# Patient Record
Sex: Female | Born: 1960
Health system: Southern US, Community
[De-identification: ages and names within clinical notes are randomized; demographics above are authoritative.]

## PROBLEM LIST (undated history)

## (undated) ENCOUNTER — Emergency Department (HOSPITAL_BASED_OUTPATIENT_CLINIC_OR_DEPARTMENT_OTHER): Admission: EM | Payer: Medicaid Other | Source: Home / Self Care

## (undated) DIAGNOSIS — I10 Essential (primary) hypertension: Secondary | ICD-10-CM

## (undated) DIAGNOSIS — E271 Primary adrenocortical insufficiency: Secondary | ICD-10-CM

## (undated) DIAGNOSIS — E039 Hypothyroidism, unspecified: Secondary | ICD-10-CM

## (undated) DIAGNOSIS — E119 Type 2 diabetes mellitus without complications: Secondary | ICD-10-CM

## (undated) DIAGNOSIS — I509 Heart failure, unspecified: Secondary | ICD-10-CM

## (undated) DIAGNOSIS — E079 Disorder of thyroid, unspecified: Secondary | ICD-10-CM

## (undated) DIAGNOSIS — G9389 Other specified disorders of brain: Secondary | ICD-10-CM

## (undated) HISTORY — DX: Other specified disorders of brain: G93.89

---

## 2005-02-17 ENCOUNTER — Inpatient Hospital Stay (HOSPITAL_COMMUNITY): Admission: EM | Admit: 2005-02-17 | Discharge: 2005-02-23 | Payer: Self-pay | Admitting: Emergency Medicine

## 2005-02-17 ENCOUNTER — Ambulatory Visit: Payer: Self-pay | Admitting: Pulmonary Disease

## 2005-03-02 ENCOUNTER — Ambulatory Visit: Payer: Self-pay | Admitting: Critical Care Medicine

## 2010-10-17 ENCOUNTER — Emergency Department (HOSPITAL_COMMUNITY): Admission: EM | Admit: 2010-10-17 | Discharge: 2010-10-18 | Payer: Self-pay | Admitting: Emergency Medicine

## 2011-03-03 LAB — DIFFERENTIAL
Basophils Absolute: 0 10*3/uL (ref 0.0–0.1)
Eosinophils Absolute: 0.2 10*3/uL (ref 0.0–0.7)
Eosinophils Relative: 3 % (ref 0–5)
Lymphs Abs: 2.2 10*3/uL (ref 0.7–4.0)
Neutrophils Relative %: 70 % (ref 43–77)

## 2011-03-03 LAB — URINE CULTURE
Colony Count: NO GROWTH
Culture: NO GROWTH

## 2011-03-03 LAB — CULTURE, BLOOD (ROUTINE X 2)
Culture  Setup Time: 201110300127
Culture: NO GROWTH

## 2011-03-03 LAB — CBC
MCH: 30.8 pg (ref 26.0–34.0)
MCV: 86.1 fL (ref 78.0–100.0)
Platelets: 175 10*3/uL (ref 150–400)
RBC: 3.81 MIL/uL — ABNORMAL LOW (ref 3.87–5.11)
RDW: 12.8 % (ref 11.5–15.5)
WBC: 9 10*3/uL (ref 4.0–10.5)

## 2011-03-03 LAB — POCT I-STAT, CHEM 8
BUN: 20 mg/dL (ref 6–23)
Chloride: 101 mEq/L (ref 96–112)
Creatinine, Ser: 1.6 mg/dL — ABNORMAL HIGH (ref 0.4–1.2)
Glucose, Bld: 105 mg/dL — ABNORMAL HIGH (ref 70–99)
Potassium: 4 mEq/L (ref 3.5–5.1)
Sodium: 134 mEq/L — ABNORMAL LOW (ref 135–145)

## 2011-03-03 LAB — URINALYSIS, ROUTINE W REFLEX MICROSCOPIC
Nitrite: NEGATIVE
Protein, ur: NEGATIVE mg/dL
Specific Gravity, Urine: 1.02 (ref 1.005–1.030)
Urobilinogen, UA: 0.2 mg/dL (ref 0.0–1.0)

## 2011-03-03 LAB — URINE MICROSCOPIC-ADD ON

## 2011-03-03 LAB — POCT PREGNANCY, URINE: Preg Test, Ur: NEGATIVE

## 2011-05-07 NOTE — Discharge Summary (Signed)
[name], [name] NO.:  [id number]   MEDICAL RECORD NO.:  [id number]          PATIENT TYPE:  INP   LOCATION:  [id number]                         FACILITY:  [facility]   PHYSICIAN:  Asencion Noble, M.D. LHCDATE OF BIRTH:  August 03, 1961   DATE OF ADMISSION:  02/17/2005  DATE OF DISCHARGE:  02/23/2005                                 DISCHARGE SUMMARY   DISCHARGE DIAGNOSES:  1.  Probable viral pneumonia with pneumonitis.  2.  Hypoxic respiratory failure secondary to #1.  3.  Probable urinary tract infection.  4.  Severe sepsis, in septic shock.  5.  Relative adrenal insufficiency.   LABORATORY DATA AND DIAGNOSTICS:  Blood cultures obtained on February 17, 2005,  no growth times five days. On February 23, 2005, sodium was 141, potassium 3.9,  chloride 111, CO2 24, glucose 87, BUN 9, creatinine 1.0. CBC revealed WBC of  6.8, hemoglobin 10.3, hematocrit 29.4, platelet count 115,000, but that was  also obtained on February 23, 2005.  Random cortisol obtained on February 18, 2005,  was 2.9, following cosyntropin stimulation test was 4.3, also obtained on  February 18, 2005. Urine Legionella antigen obtained February 18, 2005, was  negative. Lactic acid on February 17, 2005, was 0.7. Urinalysis obtained on  February 17, 2005, remaining in preliminary status, has been sent for re-  incubation for better growth. A two-view chest x-ray obtained on February 22, 2005, demonstrating mild improved bibasilar opacities. Questionable small  bilateral pleural effusions.   BRIEF HISTORY:  This is a 50 year old [place] female who had a two-day  history of upper respiratory tract infection type syndrome. Then followed by  a dry cough, increased dyspnea, alteration in consciousness. She was unable  to move or walk. She had noted increased confusion, temperature up to 104,  blood pressure presenting in the [id number] systolically, had early infiltrate in  the right greater than left lower lobes, and the pulmonary critical care  team was  asked to see and admit this patient for severe sepsis and/or early  goal-directed therapy in the emergency room.   HOSPITAL COURSE:   PROBLEM #1:  Probable viral pneumonia and pneumonitis with hypoxic  respiratory failure. The patient was admitted to the intensive care,  supported on nasal cannula oxygen, given empiric antibiotics which included  vancomycin, Rocephin, and Zithromax. The patient was also administered  Xigris on the day of admission. Her respiratory work of breathing remained  stable, she did not require intubation, although her chest x-ray worsened to  develop bilateral airspace disease. She was able to get through her critical  illness without requiring intubation.   PROBLEM #2:  Urinary tract infection. Although no organisms have been  specified at this point, the patient did have positive leukocytes and  nitrates on UA, follow up culture data is still pending at this point.  However, she will be sent home on empiric Tequin to cover for a probable  UTI.   PROBLEM #3:  Severe sepsis and septic shock. The patient was admitted to the  intensive care unit where she underwent aggressive goal directed therapy  keeping  the CVP between 8 and 12. She did require vasoactive drips with  Levophed and then later adding inotropy with dobutamine as well as requiring  blood transfusions to get her venous SVO2 greater than 70%. She stabilized  over the course of 48 hours. She was then able to wean off vasoactive drips  and was transferred to the floor where her status was stable. Again, she was  covered with broad-spectrum antibiotics and later converted to oral Tequin  on February 22, 2005. The patient also completed a 48-hour treatment regimen of  Xigris.   PROBLEM #4:  Relative adrenal insufficiency. As earlier noted, the patient  had a suboptimal response to cosyntropin skin test. She was treated with IV  Solu-Cortef. The patient was discontinued 72 hours after initiation of   therapy.   DISPOSITION UPON DISCHARGE:  She is afebrile, heart rate in the 50s, blood  pressure 117/96, oxygen saturation 96% to 98% on room air. She is alert, she  is oriented, and she is tolerating oral diet.  She denies dyspnea. Breath  sounds are clear. Heart tones are normal. She does have some trace edema,  but this is bilaterally, and she has no lower extremity pain. Her central  line was removed 24 hours ago. Here dressing was re-dressed today and the  site looks clean, dry, intact, and well approximated.   DISCHARGE INSTRUCTIONS:  Medications include Tequin 400 mg one tab p.o. for  five days. Diet as tolerated. Activity as tolerated. She is instructed to  clean this wound site from central line with clean soap and water, and place  a bandage over it every day until completely healed. She has a follow-up  appointment on March 02, 2005, at 2:50 p.m. with Dr. Joya Gaskins. At this time  the following labs should be obtained: She will need a follow-up complete  blood count to evaluate thrombocytopenia as the patient's platelets have  remained in the low 100s. Also, she will require a follow-up metabolic panel  to re-evaluate her liver enzymes. The patient's total bilirubin was 2.1,  alkaline phosphatase 143, AST 104, and ALT 103 upon time of admission. This  will need to be followed up on an outpatient basis.      PB/MEDQ  D:  02/23/2005  T:  02/23/2005  Job:  [id number]   cc:   Asencion Noble, M.D. [facility]

## 2011-05-07 NOTE — H&P (Signed)
NAMECHELBY, SHARF NO.:  [id number]   MEDICAL RECORD NO.:  [id number]          PATIENT TYPE:  EMS   LOCATION:  ED                           FACILITY:  [facility]   PHYSICIAN:  Asencion Noble, M.D. LHCDATE OF BIRTH:  1961-04-23   DATE OF ADMISSION:  02/17/2005  DATE OF DISCHARGE:                                HISTORY & PHYSICAL   CHIEF COMPLAINT:  Septic shock.   HISTORY OF PRESENT ILLNESS:  A 50 year old [place] female has a two-day  history of URI type syndrome, then a dry cough, increased dyspnea,  alterations in consciousness, not able to move or walk, has noted some  increased confusion, temperature up to 104, blood pressure in the 70s, has  an early infiltrate in the right greater than lower lobe and we are asked to  see and admit this patient for severe sepsis and early goal directed therapy  via the ED.   PAST MEDICAL HISTORY:  Negative.   No previous medications.   No allergies.   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   No previous recent admissions.   PHYSICAL EXAMINATION:  VITAL SIGNS:  Blood pressure 70/40, pulse 100, temp  104, respirations 25.  GENERAL:  This is an ill-appearing [place] female in no acute distress.  CHEST:  Showed rales at the bases, equal breath sounds.  CARDIAC:  Showed resting tachycardia without S3.  Normal S1 S2.  ABDOMEN:  Soft, nontender.  Bowel sounds active.  EXTREMITIES:  Showed no edema, or clubbing, were cool.  SKIN:  Clear.   LABORATORY DATA:  White count 6.5, hemoglobin 10.9, platelet count of 102.  Influenza B and A titers negative from the nose.  White count 0-2, red cells  3-6.  Urinalysis shows large blood, nitrite negative, leukocytes are  moderate positive on leukocyte esterase.  CMP:  Sodium 134, potassium 3.5,  chloride 102, CO2 25, BUN 17, creatinine is 1.7.  Alk phos 143, AST 104, ALT  103, total protein 6.7, albumin 3.3, calcium 8.6.   IMPRESSION:  Septic shock, likely pneumonia +/-  urinary tract source with  hypoxemia and shock state.   RECOMMENDATIONS:  1.  Administer early goal-directed therapy with resuscitation protocol      utilizing saline for CVP goals of 8-12, MAP of 65+.  2.  See sepsis protocol.  3.  Administer IV Rocephin, IV vancomycin, IV Zithromax.  4.  Check Legionella and Pneumococcal urine antigens.  5.  Follow up culture data.  6.  Admit to ICU.  7.  Place central line.      PW/MEDQ  D:  02/17/2005  T:  02/17/2005  Job:  [id number]

## 2011-05-10 ENCOUNTER — Inpatient Hospital Stay (HOSPITAL_COMMUNITY)
Admission: EM | Admit: 2011-05-10 | Discharge: 2011-05-21 | DRG: 644 | Disposition: A | Discharge status: Home / Self Care | Payer: Self-pay | Attending: Cardiovascular Disease | Admitting: Cardiovascular Disease

## 2011-05-10 ENCOUNTER — Emergency Department (HOSPITAL_COMMUNITY): Payer: Self-pay

## 2011-05-10 DIAGNOSIS — I2589 Other forms of chronic ischemic heart disease: Secondary | ICD-10-CM | POA: Diagnosis present

## 2011-05-10 DIAGNOSIS — R209 Unspecified disturbances of skin sensation: Secondary | ICD-10-CM | POA: Diagnosis present

## 2011-05-10 DIAGNOSIS — M6282 Rhabdomyolysis: Secondary | ICD-10-CM | POA: Diagnosis present

## 2011-05-10 DIAGNOSIS — D638 Anemia in other chronic diseases classified elsewhere: Secondary | ICD-10-CM | POA: Diagnosis present

## 2011-05-10 DIAGNOSIS — E1169 Type 2 diabetes mellitus with other specified complication: Secondary | ICD-10-CM | POA: Diagnosis present

## 2011-05-10 DIAGNOSIS — J40 Bronchitis, not specified as acute or chronic: Secondary | ICD-10-CM | POA: Diagnosis present

## 2011-05-10 DIAGNOSIS — E039 Hypothyroidism, unspecified: Principal | ICD-10-CM | POA: Diagnosis present

## 2011-05-10 DIAGNOSIS — R5383 Other fatigue: Secondary | ICD-10-CM | POA: Diagnosis present

## 2011-05-10 DIAGNOSIS — E871 Hypo-osmolality and hyponatremia: Secondary | ICD-10-CM | POA: Diagnosis present

## 2011-05-10 DIAGNOSIS — R5381 Other malaise: Secondary | ICD-10-CM | POA: Diagnosis present

## 2011-05-10 DIAGNOSIS — E2749 Other adrenocortical insufficiency: Secondary | ICD-10-CM | POA: Diagnosis present

## 2011-05-10 DIAGNOSIS — I10 Essential (primary) hypertension: Secondary | ICD-10-CM | POA: Diagnosis present

## 2011-05-10 DIAGNOSIS — N289 Disorder of kidney and ureter, unspecified: Secondary | ICD-10-CM | POA: Diagnosis present

## 2011-05-10 DIAGNOSIS — D696 Thrombocytopenia, unspecified: Secondary | ICD-10-CM | POA: Diagnosis present

## 2011-05-10 DIAGNOSIS — F411 Generalized anxiety disorder: Secondary | ICD-10-CM | POA: Diagnosis present

## 2011-05-10 LAB — URINALYSIS, ROUTINE W REFLEX MICROSCOPIC
Bilirubin Urine: NEGATIVE
Glucose, UA: NEGATIVE mg/dL
Ketones, ur: NEGATIVE mg/dL
pH: 7 (ref 5.0–8.0)

## 2011-05-10 LAB — DIFFERENTIAL
Basophils Absolute: 0 10*3/uL (ref 0.0–0.1)
Eosinophils Absolute: 0.3 10*3/uL (ref 0.0–0.7)
Eosinophils Absolute: 0.2 10*3/uL (ref 0.0–0.7)
Eosinophils Relative: 5 % (ref 0–5)
Metamyelocytes Relative: 0 %
Monocytes Absolute: 0.2 10*3/uL (ref 0.1–1.0)
Myelocytes: 0 %
Neutrophils Relative %: 52 % (ref 43–77)
Promyelocytes Absolute: 0 %
nRBC: 0 /100 WBC

## 2011-05-10 LAB — CBC
HCT: 28 % — ABNORMAL LOW (ref 36.0–46.0)
MCH: 28.8 pg (ref 26.0–34.0)
MCHC: 36.9 g/dL — ABNORMAL HIGH (ref 30.0–36.0)
Platelets: 140 10*3/uL — ABNORMAL LOW (ref 150–400)
Platelets: 126 10*3/uL — ABNORMAL LOW (ref 150–400)
RBC: 3.78 MIL/uL — ABNORMAL LOW (ref 3.87–5.11)
WBC: 5.8 10*3/uL (ref 4.0–10.5)

## 2011-05-10 LAB — URINE MICROSCOPIC-ADD ON

## 2011-05-10 LAB — GLUCOSE, CAPILLARY
Glucose-Capillary: 171 mg/dL — ABNORMAL HIGH (ref 70–99)
Glucose-Capillary: 176 mg/dL — ABNORMAL HIGH (ref 70–99)
Glucose-Capillary: 99 mg/dL (ref 70–99)

## 2011-05-10 LAB — POCT I-STAT, CHEM 8
BUN: 9 mg/dL (ref 6–23)
Chloride: 90 mEq/L — ABNORMAL LOW (ref 96–112)
Potassium: 3.4 mEq/L — ABNORMAL LOW (ref 3.5–5.1)
Sodium: 123 mEq/L — ABNORMAL LOW (ref 135–145)
TCO2: 24 mmol/L (ref 0–100)

## 2011-05-10 LAB — POCT CARDIAC MARKERS
CKMB, poc: 30.6 ng/mL (ref 1.0–8.0)
Myoglobin, poc: >500 ng/mL (ref 12–200)
Troponin i, poc: <0.05 ng/mL (ref 0.00–0.09)

## 2011-05-10 LAB — CK TOTAL AND CKMB (NOT AT ARMC)
CK, MB: 41.7 ng/mL (ref 0.3–4.0)
Relative Index: 0.4 (ref 0.0–2.5)

## 2011-05-11 LAB — CBC
HCT: 27.3 % — ABNORMAL LOW (ref 36.0–46.0)
MCV: 79.6 fL (ref 78.0–100.0)
RDW: 13.3 % (ref 11.5–15.5)
WBC: 5.9 10*3/uL (ref 4.0–10.5)

## 2011-05-11 LAB — GLUCOSE, CAPILLARY
Glucose-Capillary: 92 mg/dL (ref 70–99)
Glucose-Capillary: 106 mg/dL — ABNORMAL HIGH (ref 70–99)
Glucose-Capillary: 100 mg/dL — ABNORMAL HIGH (ref 70–99)
Glucose-Capillary: 76 mg/dL (ref 70–99)

## 2011-05-11 LAB — T4: T4, Total: 0.4 ug/dL — ABNORMAL LOW (ref 5.0–12.5)

## 2011-05-11 LAB — BASIC METABOLIC PANEL
GFR calc Af Amer: 58 mL/min — ABNORMAL LOW (ref >60)
GFR calc non Af Amer: 48 mL/min — ABNORMAL LOW (ref >60)
Glucose, Bld: 84 mg/dL (ref 70–99)
Potassium: 4.2 mEq/L (ref 3.5–5.1)
Sodium: 123 mEq/L — ABNORMAL LOW (ref 135–145)

## 2011-05-11 LAB — LIPID PANEL
HDL: 28 mg/dL — ABNORMAL LOW (ref >39)
Total CHOL/HDL Ratio: 7.5 RATIO
Triglycerides: 357 mg/dL — ABNORMAL HIGH (ref <150)
VLDL: 71 mg/dL — ABNORMAL HIGH (ref 0–40)

## 2011-05-11 LAB — LACTATE DEHYDROGENASE: LDH: 701 U/L — ABNORMAL HIGH (ref 94–250)

## 2011-05-11 LAB — CARDIAC PANEL(CRET KIN+CKTOT+MB+TROPI): CK, MB: 47.6 ng/mL (ref 0.3–4.0)

## 2011-05-11 LAB — T3: T3, Total: 22.6 ng/dl — ABNORMAL LOW (ref 80.0–204.0)

## 2011-05-11 LAB — CK TOTAL AND CKMB (NOT AT ARMC): Relative Index: 0.2 (ref 0.0–2.5)

## 2011-05-11 LAB — TROPONIN I: Troponin I: <0.3 ng/mL (ref <0.30)

## 2011-05-12 DIAGNOSIS — M79609 Pain in unspecified limb: Secondary | ICD-10-CM

## 2011-05-12 LAB — TROPONIN I: Troponin I: <0.3 ng/mL (ref <0.30)

## 2011-05-12 LAB — CBC
HCT: 29 % — ABNORMAL LOW (ref 36.0–46.0)
Hemoglobin: 10.6 g/dL — ABNORMAL LOW (ref 12.0–15.0)
MCH: 29 pg (ref 26.0–34.0)
MCHC: 36.6 g/dL — ABNORMAL HIGH (ref 30.0–36.0)
MCV: 79.5 fL (ref 78.0–100.0)
RDW: 13.3 % (ref 11.5–15.5)

## 2011-05-12 LAB — CK TOTAL AND CKMB (NOT AT ARMC)
CK, MB: 23.8 ng/mL (ref 0.3–4.0)
Relative Index: 0.2 (ref 0.0–2.5)

## 2011-05-12 LAB — BASIC METABOLIC PANEL
BUN: 12 mg/dL (ref 6–23)
CO2: 19 mEq/L (ref 19–32)
Calcium: 7.9 mg/dL — ABNORMAL LOW (ref 8.4–10.5)
GFR calc non Af Amer: 43 mL/min — ABNORMAL LOW (ref >60)
Glucose, Bld: 290 mg/dL — ABNORMAL HIGH (ref 70–99)

## 2011-05-12 LAB — GLUCOSE, CAPILLARY: Glucose-Capillary: 203 mg/dL — ABNORMAL HIGH (ref 70–99)

## 2011-05-12 LAB — ANA: Anti Nuclear Antibody(ANA): NEGATIVE

## 2011-05-13 LAB — COMPREHENSIVE METABOLIC PANEL
ALT: 48 U/L — ABNORMAL HIGH (ref 0–35)
CO2: 20 mEq/L (ref 19–32)
Calcium: 8.1 mg/dL — ABNORMAL LOW (ref 8.4–10.5)
GFR calc non Af Amer: 43 mL/min — ABNORMAL LOW (ref >60)
Glucose, Bld: 194 mg/dL — ABNORMAL HIGH (ref 70–99)
Sodium: 130 mEq/L — ABNORMAL LOW (ref 135–145)

## 2011-05-13 LAB — CBC
MCH: 28.8 pg (ref 26.0–34.0)
MCV: 80.8 fL (ref 78.0–100.0)
Platelets: 131 10*3/uL — ABNORMAL LOW (ref 150–400)
RBC: 3.13 MIL/uL — ABNORMAL LOW (ref 3.87–5.11)
RDW: 13.7 % (ref 11.5–15.5)
WBC: 12.5 10*3/uL — ABNORMAL HIGH (ref 4.0–10.5)

## 2011-05-13 LAB — TROPONIN I: Troponin I: <0.3 ng/mL (ref <0.30)

## 2011-05-13 LAB — CK TOTAL AND CKMB (NOT AT ARMC): Total CK: 10861 U/L — ABNORMAL HIGH (ref 7–177)

## 2011-05-13 LAB — GLUCOSE, CAPILLARY: Glucose-Capillary: 190 mg/dL — ABNORMAL HIGH (ref 70–99)

## 2011-05-14 LAB — BASIC METABOLIC PANEL
Calcium: 8.2 mg/dL — ABNORMAL LOW (ref 8.4–10.5)
Creatinine, Ser: 1.26 mg/dL — ABNORMAL HIGH (ref 0.4–1.2)
GFR calc Af Amer: 54 mL/min — ABNORMAL LOW (ref >60)
GFR calc non Af Amer: 45 mL/min — ABNORMAL LOW (ref >60)

## 2011-05-14 LAB — CK: Total CK: 5196 U/L — ABNORMAL HIGH (ref 7–177)

## 2011-05-14 LAB — GLUCOSE, CAPILLARY
Glucose-Capillary: 291 mg/dL — ABNORMAL HIGH (ref 70–99)
Glucose-Capillary: 310 mg/dL — ABNORMAL HIGH (ref 70–99)
Glucose-Capillary: 96 mg/dL (ref 70–99)

## 2011-05-14 LAB — CBC
MCH: 29 pg (ref 26.0–34.0)
MCHC: 35.1 g/dL (ref 30.0–36.0)
Platelets: 143 10*3/uL — ABNORMAL LOW (ref 150–400)
RBC: 3 MIL/uL — ABNORMAL LOW (ref 3.87–5.11)
RDW: 14.3 % (ref 11.5–15.5)

## 2011-05-15 LAB — GLUCOSE, CAPILLARY
Glucose-Capillary: 69 mg/dL — ABNORMAL LOW (ref 70–99)
Glucose-Capillary: 89 mg/dL (ref 70–99)
Glucose-Capillary: 236 mg/dL — ABNORMAL HIGH (ref 70–99)

## 2011-05-15 LAB — BASIC METABOLIC PANEL
Chloride: 100 mEq/L (ref 96–112)
GFR calc Af Amer: 57 mL/min — ABNORMAL LOW (ref >60)
GFR calc non Af Amer: 47 mL/min — ABNORMAL LOW (ref >60)
Potassium: 3.8 mEq/L (ref 3.5–5.1)

## 2011-05-15 LAB — IRON: Iron: 44 ug/dL (ref 42–135)

## 2011-05-16 LAB — COMPREHENSIVE METABOLIC PANEL
ALT: 66 U/L — ABNORMAL HIGH (ref 0–35)
CO2: 30 mEq/L (ref 19–32)
Calcium: 8.4 mg/dL (ref 8.4–10.5)
Chloride: 99 mEq/L (ref 96–112)
Creatinine, Ser: 1.29 mg/dL — ABNORMAL HIGH (ref 0.4–1.2)
GFR calc non Af Amer: 44 mL/min — ABNORMAL LOW (ref >60)
Glucose, Bld: 91 mg/dL (ref 70–99)
Sodium: 134 mEq/L — ABNORMAL LOW (ref 135–145)
Total Bilirubin: 0.4 mg/dL (ref 0.3–1.2)

## 2011-05-16 LAB — GLUCOSE, CAPILLARY: Glucose-Capillary: 97 mg/dL (ref 70–99)

## 2011-05-17 ENCOUNTER — Inpatient Hospital Stay (HOSPITAL_COMMUNITY): Payer: Self-pay

## 2011-05-17 LAB — CBC
HCT: 28.6 % — ABNORMAL LOW (ref 36.0–46.0)
Hemoglobin: 10 g/dL — ABNORMAL LOW (ref 12.0–15.0)
RBC: 3.37 MIL/uL — ABNORMAL LOW (ref 3.87–5.11)
RDW: 13.8 % (ref 11.5–15.5)
WBC: 10.2 10*3/uL (ref 4.0–10.5)

## 2011-05-17 LAB — GLUCOSE, CAPILLARY
Glucose-Capillary: 182 mg/dL — ABNORMAL HIGH (ref 70–99)
Glucose-Capillary: 170 mg/dL — ABNORMAL HIGH (ref 70–99)

## 2011-05-18 LAB — GLUCOSE, CAPILLARY
Glucose-Capillary: 92 mg/dL (ref 70–99)
Glucose-Capillary: 93 mg/dL (ref 70–99)

## 2011-05-19 LAB — GLUCOSE, CAPILLARY
Glucose-Capillary: 92 mg/dL (ref 70–99)
Glucose-Capillary: 106 mg/dL — ABNORMAL HIGH (ref 70–99)
Glucose-Capillary: 129 mg/dL — ABNORMAL HIGH (ref 70–99)

## 2011-05-19 LAB — COMPREHENSIVE METABOLIC PANEL
AST: 35 U/L (ref 0–37)
Albumin: 3.5 g/dL (ref 3.5–5.2)
Chloride: 94 mEq/L — ABNORMAL LOW (ref 96–112)
Creatinine, Ser: 1.34 mg/dL — ABNORMAL HIGH (ref 0.4–1.2)
GFR calc Af Amer: 50 mL/min — ABNORMAL LOW (ref >60)
Total Bilirubin: 0.5 mg/dL (ref 0.3–1.2)

## 2011-05-19 NOTE — Consult Note (Signed)
NAMETERECITA, [name]              ACCOUNT NO.:  [id number]  MEDICAL RECORD NO.:  [id number]           PATIENT TYPE:  I  LOCATION:  [id number]                         FACILITY:  [facility]  PHYSICIAN:  Elayne Snare, M.D.       DATE OF BIRTH:  02-25-1961  DATE OF CONSULTATION: DATE OF DISCHARGE:                                CONSULTATION   REASON FOR CONSULTATION:  Abnormal thyroid and cortisol levels.  HISTORY:  This patient was admitted to the hospital because of progressive weakness and numbness in her legs.  The patient says that the week prior to her admission she had a cough and cold and was also having some nausea.  She was then brought to the emergency room because of leg weakness and numbness.  She was admitted because she was found to have a low sodium of 123 and also a very high CK of 16,000.  On questioning, the patient's daughter says that she has been having some generalized weakness, lethargy and sleepiness for several months. She has not discussed this with any physician and has not seen a physician since last year.  The patient apparently also has been feeling tired overall.  She complains of feeling cold excessively and this is going on for several months also.  She has had  weakness in her legs only for several days.  The patient has not had any history of thyroid disease in the past. However, her total T4 level done yesterday was 0.4 and her TSH was 17.6. Also it was found that her cortisol level was low at 0.5 done yesterday at about noon.  The patient does also have diabetes for about a year or so and is being treated with Glucotrol.  She does not check her blood sugar much at all at home and her A1c was high at 9.6.  Since admission, the patient has had relatively lower temperature ranging from 97 to about 98.2 as well as her pulse has been variable but as low as 65.  Her blood pressure has been somewhat lower about [id number] systolic and the patient also has had some  problems with nausea and vomiting.  PAST HISTORY:  She had history of respiratory failure, sepsis, and probable pneumonia in 2006.  At that time apparently her cortisol was assessed because of low blood pressure and had a level of 2.9 and after ACTH it was only 4.3.  The patient was, however, not discharged on any steroids.  On questioning, the patient's daughter says that sometimes she does feel weak and lightheaded but has not had any weight loss or recurrent nausea or diarrhea.  Home medications were lisinopril 5 mg, Zantac, Glucotrol XL 10 mg daily as well as aspirin 81 mg.  ALLERGIES:  None known.  REVIEW OF SYSTEMS:  The patient has not had any history of coronary artery disease or chest pain.  She has not had any problems with shortness of breath or change in bowel habits.  She does have some occasional numbness in her feet.  The patient was having significant muscle cramps at the time of admission.  She has not had  any eye exams recently.  She does not complain of significant joint pains or any rash except for a rash around her eyes in the last couple of days in the hospital.  PHYSICAL EXAMINATION:  GENERAL:  The patient is somnolent but is arousable and converses lucidly and disoriented. VITAL SIGNS:  Her blood pressure is 104/69, pulse is 88 at the moment. HEENT EXAMINATION:  The patient has rather puffiness of her eyes and some edema of her skin throughout the body.  Her fundi showed no retinopathy and her discs appeared to be normal, no evidence of papilledema. NECK:  Exam shows no thyroid enlargement or lymphadenopathy. HEART:  Heart sounds are normal. LUNGS:  Clear. ABDOMEN:  No distention, mass or tenderness. EXTREMITIES:  No edema.  Pedal pulses are normal.  She has normal sensation distally.  Deep tendon reflexes are difficult to elicit but at least at the knees show rather severe slow relaxation.  ASSESSMENT:  The patient has severe hypothyroidism, this is  likely to be primary as her TSH is high.  However, we would expect her TSH to be higher and not clear if there is an element of secondary hypothyroidism also.  Also the patient likely has adrenal insufficiency because of her very low cortisol level yesterday and also history of low cortisol levels in 2006.  RECOMMENDATIONS:  We will start thyroxine supplementation.  Although ACT stimulation is being done, it may not be accurate because of her starting Solu-Medrol last evening by rheumatologist.  I would expect that her CPK being high is related to severe hypothyroidism.  Once her steroids are tapered, she can be on hydrocortisone orally.     Elayne Snare, M.D.     AK/MEDQ  D:  05/12/2011  T:  05/12/2011  Job:  [id number]  cc:   Birdie Riddle, M.D. Fax: [phone]  Lindaann Slough, M.D. [place] [phone]  Electronically Signed by Elayne Snare M.D. on 05/19/2011 10:37:35 AM

## 2011-05-20 LAB — BASIC METABOLIC PANEL
BUN: 24 mg/dL — ABNORMAL HIGH (ref 6–23)
Calcium: 9.2 mg/dL (ref 8.4–10.5)
Creatinine, Ser: 1.3 mg/dL — ABNORMAL HIGH (ref 0.4–1.2)
GFR calc non Af Amer: 43 mL/min — ABNORMAL LOW (ref >60)
Glucose, Bld: 158 mg/dL — ABNORMAL HIGH (ref 70–99)

## 2011-05-20 LAB — GLUCOSE, CAPILLARY: Glucose-Capillary: 151 mg/dL — ABNORMAL HIGH (ref 70–99)

## 2011-05-21 LAB — GLUCOSE, CAPILLARY: Glucose-Capillary: 157 mg/dL — ABNORMAL HIGH (ref 70–99)

## 2011-05-26 NOTE — Discharge Summary (Signed)
[name], [name]              ACCOUNT NO.:  [id number]  MEDICAL RECORD NO.:  [id number]           PATIENT TYPE:  I  LOCATION:  [id number]                         FACILITY:  [facility]  PHYSICIAN:  Birdie Riddle, M.D.  DATE OF BIRTH:  Apr 04, 1961  DATE OF ADMISSION:  05/10/2011 DATE OF DISCHARGE:  05/21/2011                              DISCHARGE SUMMARY   FINAL DIAGNOSES: 1. Severe hypothyroidism. 2. Severe hyponatremia. 3. Generalized weakness. 4. Anemia of chronic disease. 5. Rhabdomyolysis. 6. Adrenal insufficiency. 7. Diabetes mellitus type 2.  DISCHARGE MEDICATIONS: 1. Hydrocortisone 10 mg tablet two in the morning and one in the     evening. 2. Levothyroxine 100 mcg one daily. 3. Multivitamin one daily. 4. Protonix 40 mg daily. 5. Glucotrol XL 10 mg half a tablet twice daily. 6. Tylenol 500 mg one every 6 hours as needed. 7. Aspirin 81 mg one daily. 8. Zantac 75 mg one or two tablets daily as needed.  The patient to discontinue taking lisinopril for renal insufficiency.  DISCHARGE ACTIVITY:  The patient to increase activity gradually as tolerated.  FOLLOWUP:  By Dr. Dixie Dials in 1 week and by Dr. Elayne Snare in 1 month.  CONDITION ON DISCHARGE:  Improved.  DISCHARGE DIET:  Low sodium, heart healthy diet.  HISTORY:  This 50 year old Asian-American female presented with weakness, numbness for 4-5 days duration.  The patient claims to have decrease salt intake x6 months and has past medical history of diabetes, hypertension and anxiety.  PHYSICAL EXAMINATION:  VITAL SIGNS:  On physical examination, temperature 98, pulse 70, respirations 18, blood pressure 145/85, height 5 feet 4 inches, weight approximately 115 pounds. GENERAL:  The patient is averagely built and nourished female, in mild distress. HEENT:  The patient is normocephalic, atraumatic with brown eyes. Conjunctivae pale pink.  Sclerae nonicteric. NECK:  No JVD. LUNGS:  Clear bilaterally. HEART:   Normal S1, S2. ABDOMEN:  Soft. EXTREMITIES:  No edema, cyanosis, clubbing. SKIN:  Warm and dry. NEUROLOGIC:  Neurologically the patient moves all four extremities.  LABORATORY DATA:  Hemoglobin 11.2, hematocrit 33.  Sodium low at 123, potassium 3.4, BUN 9, creatinine 1.4, glucose 162.  Cardiac markers on point of care showed myoglobin elevated at more than 500, CK-MB elevated at 30, troponin-I normal at 0.05.  Subsequent test showed CK-MB at 9687, this peaked at 17367 with an MB of 47.6 and troponin less than 0.3.  Her thyroid stimulating hormone was high at 17.6 with T3 of 22.6 and T4 of 0.4, both were very low.  Cortisol level was low at 0.5.  Her BMET on May 12, 2011, showed sodium of 122, potassium of 4.4.  On May 14, 2011, the sodium was up to 131, potassium of 4.2; and on May 20, 2011, the sodium was up to 131, potassium 4.1, creatinine of 1.30.  Total CPK was down to 409 on May 20, 2011.  Her liver enzymes were near normal on May 19, 2011.    Her chest x-ray showed mild bronchitis changes.  Subsequent chest x-ray  showed right pleural effusion.   A 2-D echocardiogram showed mild to moderate LV  systolic dysfunction with mild concentric hypertrophy and a small pericardial effusion.  HOSPITAL COURSE:  The patient was admitted to step-down unit.  She was started on IV normal saline for her dehydration with hyponatremia.  She had glycemic control order.  She received potassium supplementations. Her T3, T4, TSH, cortisol level, ESR, ANA, lipid levels were checked on May 11, 2011, with markedly low T3 and T4 and markedly low cortisol level.  Consultation was obtained with Dr. Elayne Snare, an endocrinologist. rheumatology consultation was obtained for significant myalgias.  The patient was placed on Solu-Medrol 40 mg IV q.8 h with significant improvement in her condition after Synthroid 50 mcg IV x1 followed by 100 mcg p.o. daily.  She also received IV dopamine for renal perfusion,  her renal function improved.  Her total CPK started going down.  Her troponin I remained on normal side.  She had EKG changes of diffuse ischemia that also improved and she had absence of chest pain.  She will undergo cardiac workup on outpatient basis in near future.  On May 13, 2011, she was started on amoxicillin 500 mg q.8 h for symptoms of bronchitis.  The patient was then transferred to regular floor.  She was encouraged oral fluids and on May 21, 2011, she was discharged home in satisfactory condition with a followup by me in 1 week and by endocrinologist Dr. Elayne Snare in 1 month.     Birdie Riddle, M.D.     ASK/MEDQ  D:  05/21/2011  T:  05/21/2011  Job:  [id number]  Electronically Signed by Dixie Dials M.D. on 05/26/2011 08:30:06 AM

## 2011-06-12 ENCOUNTER — Inpatient Hospital Stay (HOSPITAL_COMMUNITY)
Admission: EM | Admit: 2011-06-12 | Discharge: 2011-06-17 | DRG: 644 | Disposition: A | Discharge status: Home / Self Care | Payer: Self-pay | Attending: Cardiovascular Disease | Admitting: Cardiovascular Disease

## 2011-06-12 DIAGNOSIS — R7401 Elevation of levels of liver transaminase levels: Secondary | ICD-10-CM | POA: Diagnosis present

## 2011-06-12 DIAGNOSIS — E119 Type 2 diabetes mellitus without complications: Secondary | ICD-10-CM | POA: Diagnosis present

## 2011-06-12 DIAGNOSIS — E871 Hypo-osmolality and hyponatremia: Secondary | ICD-10-CM | POA: Diagnosis present

## 2011-06-12 DIAGNOSIS — D638 Anemia in other chronic diseases classified elsewhere: Secondary | ICD-10-CM | POA: Diagnosis present

## 2011-06-12 DIAGNOSIS — R5381 Other malaise: Secondary | ICD-10-CM | POA: Diagnosis present

## 2011-06-12 DIAGNOSIS — E2749 Other adrenocortical insufficiency: Principal | ICD-10-CM | POA: Diagnosis present

## 2011-06-12 DIAGNOSIS — E039 Hypothyroidism, unspecified: Secondary | ICD-10-CM | POA: Diagnosis present

## 2011-06-12 DIAGNOSIS — K219 Gastro-esophageal reflux disease without esophagitis: Secondary | ICD-10-CM | POA: Diagnosis present

## 2011-06-12 DIAGNOSIS — R7402 Elevation of levels of lactic acid dehydrogenase (LDH): Secondary | ICD-10-CM | POA: Diagnosis present

## 2011-06-12 DIAGNOSIS — I10 Essential (primary) hypertension: Secondary | ICD-10-CM | POA: Diagnosis present

## 2011-06-12 LAB — CBC
Platelets: 157 10*3/uL (ref 150–400)
RBC: 3.52 MIL/uL — ABNORMAL LOW (ref 3.87–5.11)
RDW: 14 % (ref 11.5–15.5)
WBC: 8.4 10*3/uL (ref 4.0–10.5)

## 2011-06-12 LAB — URINALYSIS, ROUTINE W REFLEX MICROSCOPIC
Glucose, UA: NEGATIVE mg/dL
Leukocytes, UA: NEGATIVE
Nitrite: NEGATIVE
Protein, ur: NEGATIVE mg/dL
Urobilinogen, UA: 0.2 mg/dL (ref 0.0–1.0)

## 2011-06-12 LAB — POCT I-STAT, CHEM 8
BUN: 15 mg/dL (ref 6–23)
Chloride: 87 mEq/L — ABNORMAL LOW (ref 96–112)
HCT: 33 % — ABNORMAL LOW (ref 36.0–46.0)
Potassium: 4.2 mEq/L (ref 3.5–5.1)
Sodium: 123 mEq/L — ABNORMAL LOW (ref 135–145)

## 2011-06-12 LAB — GLUCOSE, CAPILLARY: Glucose-Capillary: 167 mg/dL — ABNORMAL HIGH (ref 70–99)

## 2011-06-12 LAB — COMPREHENSIVE METABOLIC PANEL
ALT: 55 U/L — ABNORMAL HIGH (ref 0–35)
AST: 53 U/L — ABNORMAL HIGH (ref 0–37)
Alkaline Phosphatase: 91 U/L (ref 39–117)
CO2: 29 mEq/L (ref 19–32)
Calcium: 9.6 mg/dL (ref 8.4–10.5)
GFR calc Af Amer: >60 mL/min (ref >60)
GFR calc non Af Amer: >60 mL/min (ref >60)
Glucose, Bld: 152 mg/dL — ABNORMAL HIGH (ref 70–99)
Potassium: 4 mEq/L (ref 3.5–5.1)
Sodium: 121 mEq/L — ABNORMAL LOW (ref 135–145)

## 2011-06-12 LAB — DIFFERENTIAL
Basophils Absolute: 0 10*3/uL (ref 0.0–0.1)
Basophils Relative: 0 % (ref 0–1)
Eosinophils Absolute: 0.2 10*3/uL (ref 0.0–0.7)
Eosinophils Relative: 2 % (ref 0–5)
Lymphs Abs: 3.6 10*3/uL (ref 0.7–4.0)
Neutrophils Relative %: 50 % (ref 43–77)

## 2011-06-12 LAB — CK TOTAL AND CKMB (NOT AT ARMC): Relative Index: 2.2 (ref 0.0–2.5)

## 2011-06-13 ENCOUNTER — Inpatient Hospital Stay (HOSPITAL_COMMUNITY): Payer: Self-pay

## 2011-06-13 LAB — CBC
HCT: 32.7 % — ABNORMAL LOW (ref 36.0–46.0)
MCHC: 34.6 g/dL (ref 30.0–36.0)
RDW: 14.4 % (ref 11.5–15.5)
WBC: 8.4 10*3/uL (ref 4.0–10.5)

## 2011-06-13 LAB — DIFFERENTIAL
Basophils Absolute: 0 10*3/uL (ref 0.0–0.1)
Basophils Relative: 0 % (ref 0–1)
Eosinophils Relative: 0 % (ref 0–5)
Lymphocytes Relative: 12 % (ref 12–46)
Monocytes Absolute: 0.2 10*3/uL (ref 0.1–1.0)
Neutro Abs: 7.2 10*3/uL (ref 1.7–7.7)

## 2011-06-13 LAB — BASIC METABOLIC PANEL
Chloride: 92 mEq/L — ABNORMAL LOW (ref 96–112)
GFR calc Af Amer: >60 mL/min (ref >60)
GFR calc non Af Amer: >60 mL/min (ref >60)
Potassium: 4.5 mEq/L (ref 3.5–5.1)
Sodium: 128 mEq/L — ABNORMAL LOW (ref 135–145)

## 2011-06-13 LAB — TSH: TSH: 3.862 u[IU]/mL (ref 0.350–4.500)

## 2011-06-13 LAB — GLUCOSE, CAPILLARY
Glucose-Capillary: 315 mg/dL — ABNORMAL HIGH (ref 70–99)
Glucose-Capillary: 98 mg/dL (ref 70–99)
Glucose-Capillary: 240 mg/dL — ABNORMAL HIGH (ref 70–99)

## 2011-06-13 LAB — T4, FREE: Free T4: 1.31 ng/dL (ref 0.80–1.80)

## 2011-06-13 LAB — IRON AND TIBC
Saturation Ratios: 17 % — ABNORMAL LOW (ref 20–55)
UIBC: 269 ug/dL

## 2011-06-13 LAB — LIPID PANEL
Cholesterol: 218 mg/dL — ABNORMAL HIGH (ref 0–200)
HDL: 65 mg/dL (ref >39)
Total CHOL/HDL Ratio: 3.4 RATIO
Triglycerides: 85 mg/dL (ref <150)

## 2011-06-13 LAB — PROLACTIN: Prolactin: 7.1 ng/mL

## 2011-06-13 LAB — OSMOLALITY: Osmolality: 256 mOsm/kg — ABNORMAL LOW (ref 275–300)

## 2011-06-13 LAB — HEMOGLOBIN A1C: Mean Plasma Glucose: 194 mg/dL — ABNORMAL HIGH (ref <117)

## 2011-06-13 IMAGING — US US ABDOMEN COMPLETE
1 series · 13 of 25 positions shown · non-contrast
Comparison: CT abdomen pelvis [DATE]

CLINICAL DATA: Transaminitis.

ABDOMINAL ULTRASOUND COMPLETE

[Series 1: us abdomen complete · 0.28mm/px · 13 of 71 slices shown]
[im 1/71]
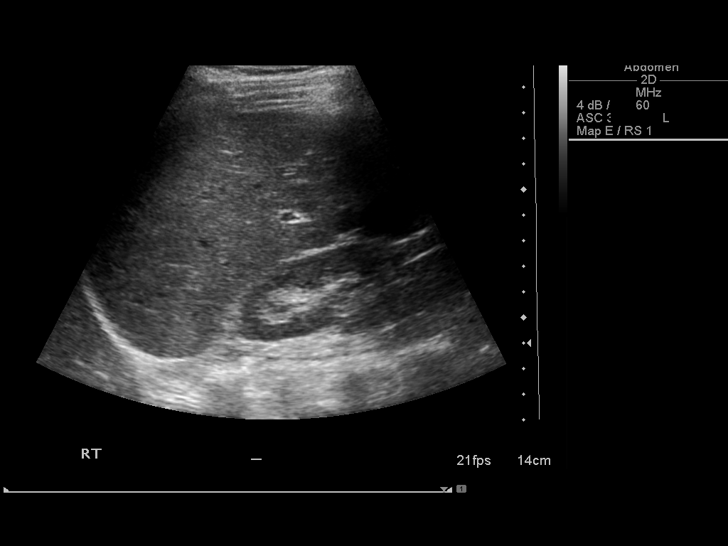
[im 6/71]
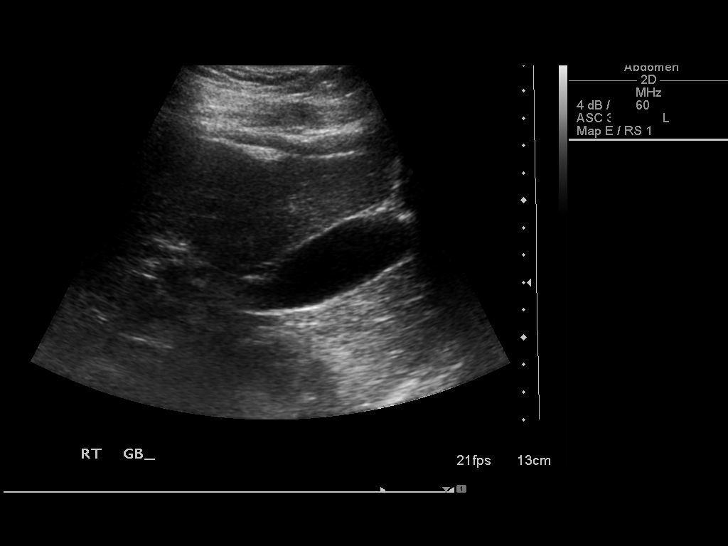
[im 12/71]
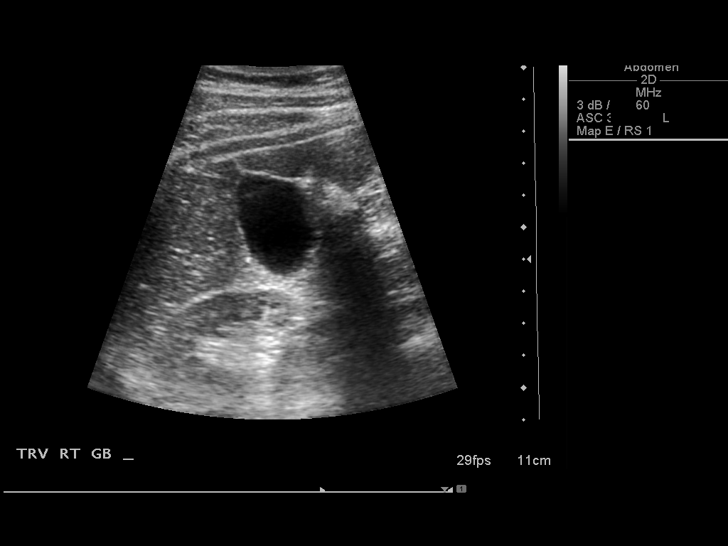
[im 18/71]
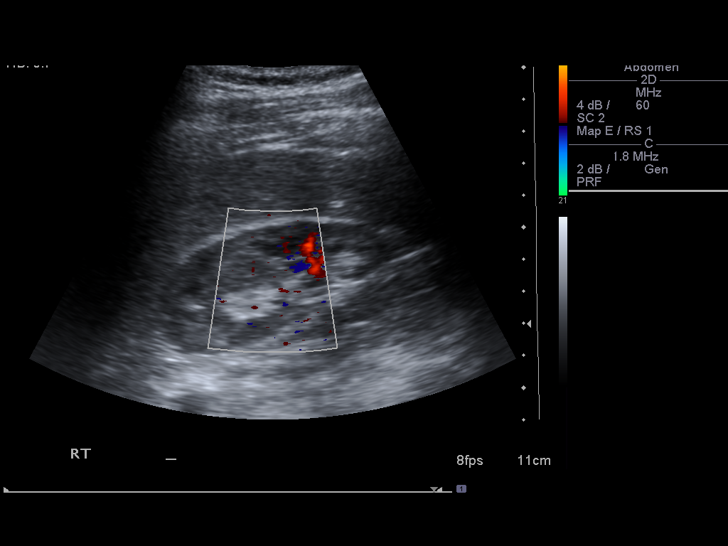
[im 24/71]
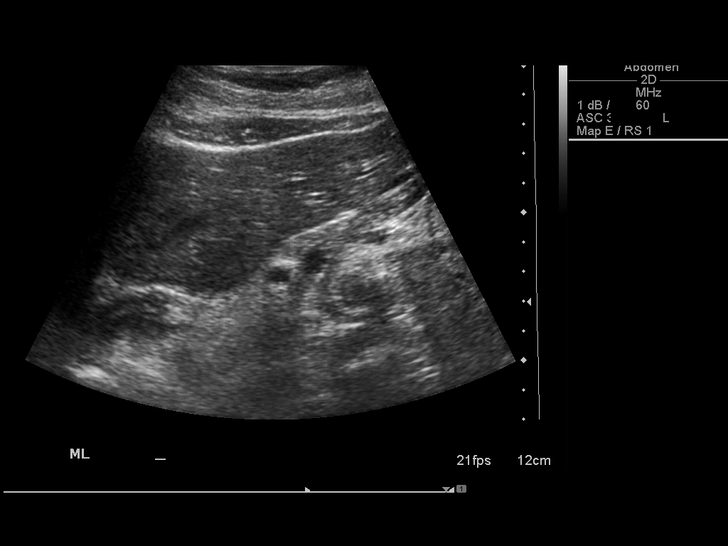
[im 30/71]
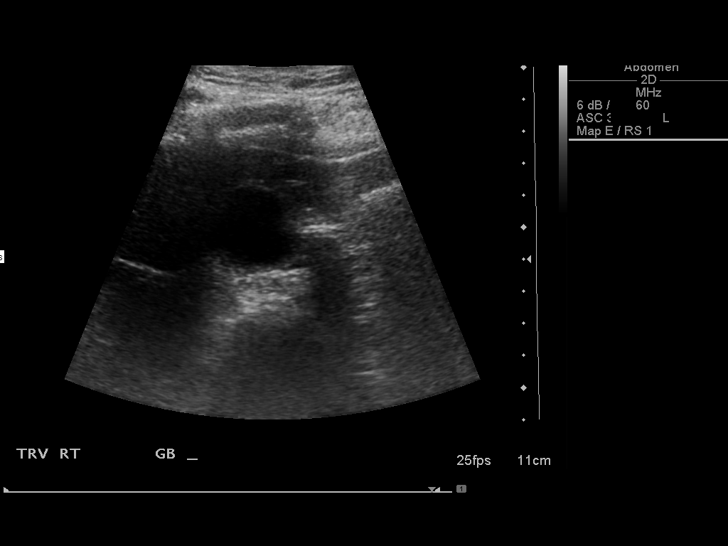
[im 36/71]
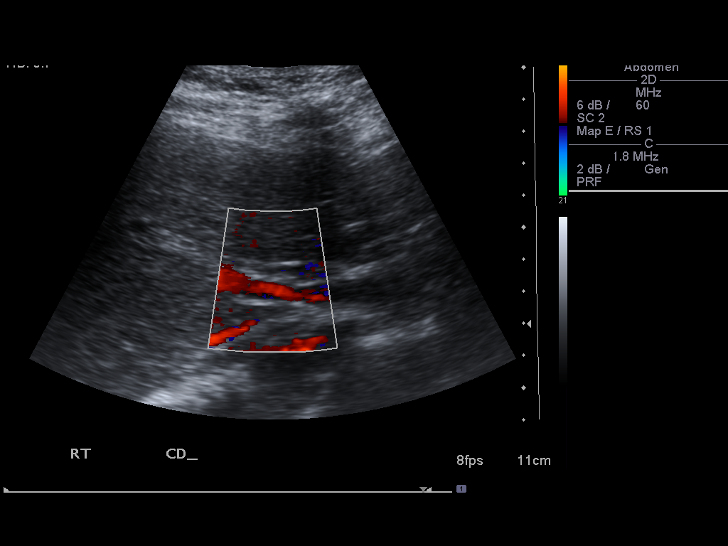
[im 41/71]
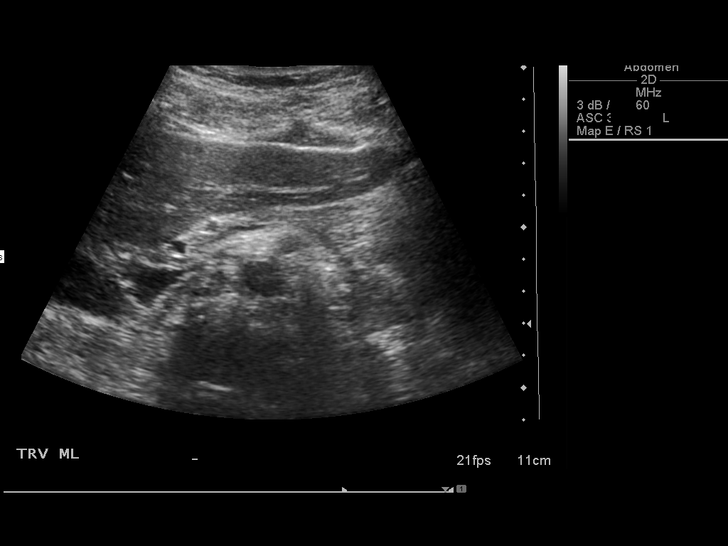
[im 47/71]
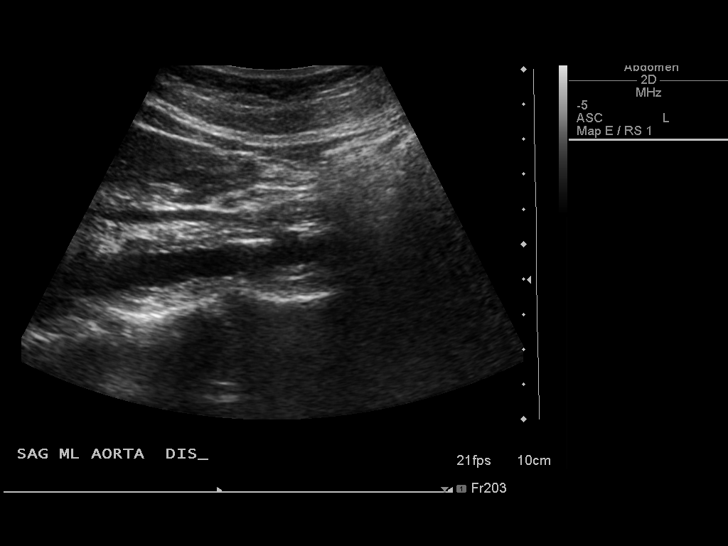
[im 53/71]
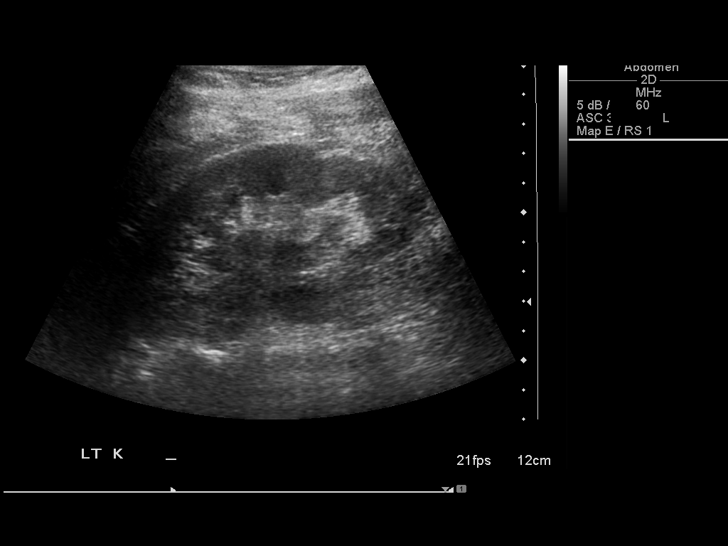
[im 59/71]
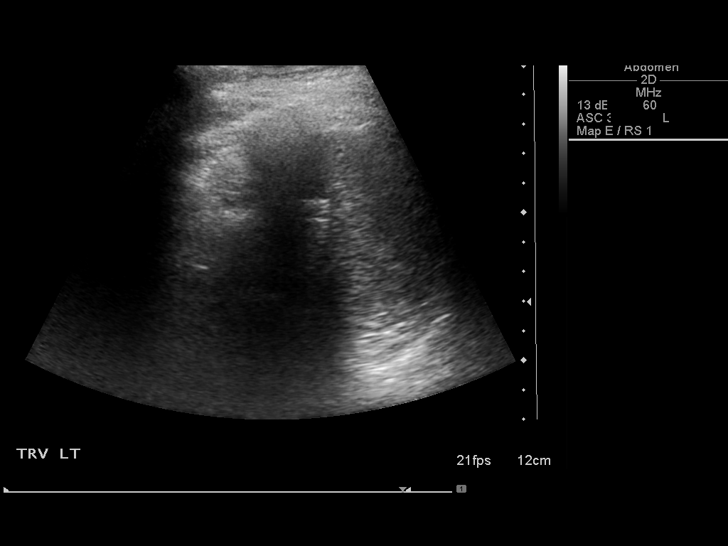
[im 65/71]
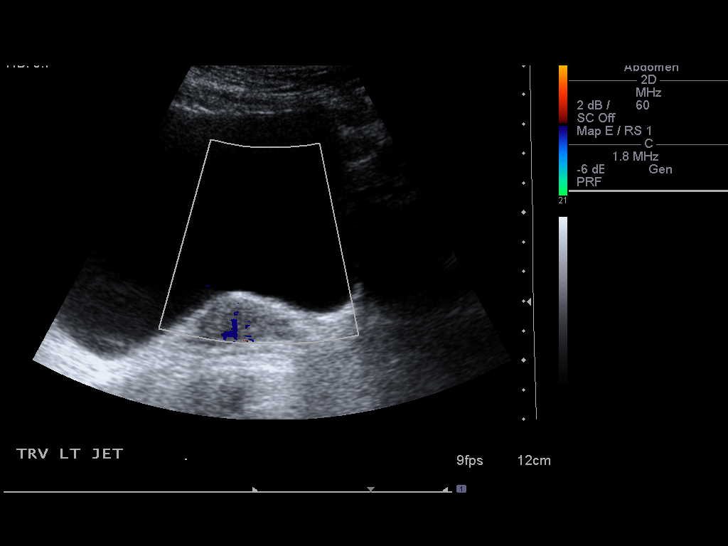
[im 71/71]
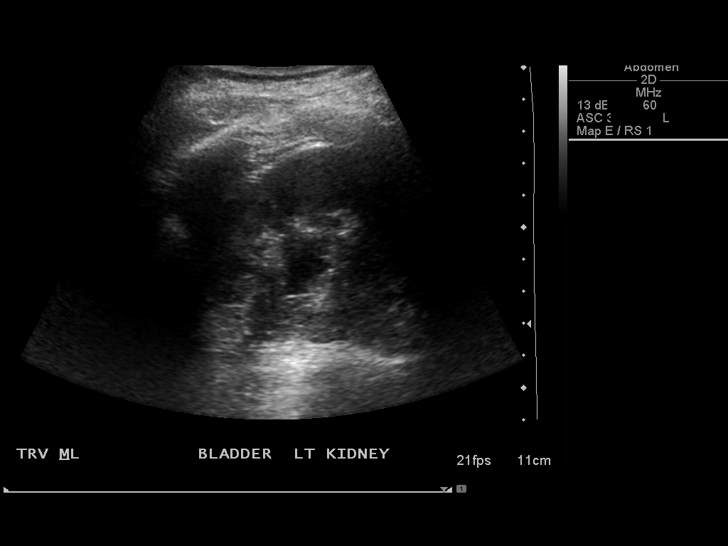

[13 of 25 positions shown; findings below may reference images not displayed]

FINDINGS: Gallbladder:  No gallstones, gallbladder wall thickening, or
pericholecystic fluid.No evidence of gallbladder sludge by
ultrasound.  Sonographic Murphy's sign is negative.

Common Bile Duct:  Within normal limits in caliber.

Liver: No focal mass lesion identified.  Within normal limits in
parenchymal echogenicity.Negative for intrahepatic biliary ductal
dilatation.

IVC:  Appears normal.

Pancreas:  Although the pancreas is difficult to visualize in its
entirety, no focal pancreatic abnormality is identified.

Spleen:  Within normal limits in size and echotexture.

Right kidney:  Normal in size and parenchymal echogenicity.  No
evidence of mass or hydronephrosis.

Left kidney:  Normal in size and parenchymal echogenicity.  No
evidence of mass or hydronephrosis. Fullness of the left renal
pelvis is likely secondary to an extrarenal pelvis, when correlated
with CT. The calyces are not dilated.

Abdominal Aorta:  No aneurysm identified.

The urinary bladder was evaluated and has normal appearances.
IMPRESSION: 1.  No explanation for transaminitis.
2.  Left renal pelvic fullness is likely due to an extrarenal
pelvis.  Negative for hydronephrosis.

## 2011-06-14 LAB — BASIC METABOLIC PANEL
BUN: 19 mg/dL (ref 6–23)
Chloride: 99 mEq/L (ref 96–112)
Creatinine, Ser: 0.93 mg/dL (ref 0.50–1.10)
GFR calc Af Amer: >60 mL/min (ref >60)
GFR calc non Af Amer: >60 mL/min (ref >60)
Glucose, Bld: 262 mg/dL — ABNORMAL HIGH (ref 70–99)

## 2011-06-14 LAB — GLUCOSE, CAPILLARY: Glucose-Capillary: 311 mg/dL — ABNORMAL HIGH (ref 70–99)

## 2011-06-15 LAB — GLUCOSE, CAPILLARY
Glucose-Capillary: 157 mg/dL — ABNORMAL HIGH (ref 70–99)
Glucose-Capillary: 351 mg/dL — ABNORMAL HIGH (ref 70–99)
Glucose-Capillary: 141 mg/dL — ABNORMAL HIGH (ref 70–99)
Glucose-Capillary: 148 mg/dL — ABNORMAL HIGH (ref 70–99)

## 2011-06-15 LAB — CBC
MCV: 87.5 fL (ref 78.0–100.0)
Platelets: 151 10*3/uL (ref 150–400)
RBC: 3.28 MIL/uL — ABNORMAL LOW (ref 3.87–5.11)
WBC: 9.5 10*3/uL (ref 4.0–10.5)

## 2011-06-15 LAB — BASIC METABOLIC PANEL
CO2: 30 mEq/L (ref 19–32)
Chloride: 100 mEq/L (ref 96–112)
Creatinine, Ser: 0.9 mg/dL (ref 0.50–1.10)
GFR calc Af Amer: >60 mL/min (ref >60)
Potassium: 3.8 mEq/L (ref 3.5–5.1)

## 2011-06-16 LAB — GLUCOSE, CAPILLARY: Glucose-Capillary: 210 mg/dL — ABNORMAL HIGH (ref 70–99)

## 2011-06-17 LAB — GLUCOSE, CAPILLARY: Glucose-Capillary: 114 mg/dL — ABNORMAL HIGH (ref 70–99)

## 2011-06-18 LAB — GLUCOSE, CAPILLARY: Glucose-Capillary: 330 mg/dL — ABNORMAL HIGH (ref 70–99)

## 2011-06-19 ENCOUNTER — Emergency Department (HOSPITAL_COMMUNITY)
Admission: EM | Admit: 2011-06-19 | Discharge: 2011-06-20 | Disposition: A | Discharge status: Home / Self Care | Payer: Self-pay | Attending: Emergency Medicine | Admitting: Emergency Medicine

## 2011-06-19 DIAGNOSIS — E2749 Other adrenocortical insufficiency: Secondary | ICD-10-CM | POA: Insufficient documentation

## 2011-06-19 DIAGNOSIS — E039 Hypothyroidism, unspecified: Secondary | ICD-10-CM | POA: Insufficient documentation

## 2011-06-19 DIAGNOSIS — I10 Essential (primary) hypertension: Secondary | ICD-10-CM | POA: Insufficient documentation

## 2011-06-19 DIAGNOSIS — E119 Type 2 diabetes mellitus without complications: Secondary | ICD-10-CM | POA: Insufficient documentation

## 2011-06-19 DIAGNOSIS — Z794 Long term (current) use of insulin: Secondary | ICD-10-CM | POA: Insufficient documentation

## 2011-06-19 DIAGNOSIS — M7989 Other specified soft tissue disorders: Secondary | ICD-10-CM | POA: Insufficient documentation

## 2011-06-19 LAB — CBC
MCH: 30.7 pg (ref 26.0–34.0)
Platelets: 152 10*3/uL (ref 150–400)
RBC: 3.13 MIL/uL — ABNORMAL LOW (ref 3.87–5.11)

## 2011-06-19 LAB — BASIC METABOLIC PANEL
CO2: 28 mEq/L (ref 19–32)
Calcium: 8.9 mg/dL (ref 8.4–10.5)
GFR calc non Af Amer: 59 mL/min — ABNORMAL LOW (ref >60)
Potassium: 3.9 mEq/L (ref 3.5–5.1)
Sodium: 131 mEq/L — ABNORMAL LOW (ref 135–145)

## 2011-06-19 LAB — DIFFERENTIAL
Basophils Absolute: 0 10*3/uL (ref 0.0–0.1)
Basophils Relative: 0 % (ref 0–1)
Eosinophils Absolute: 0.1 10*3/uL (ref 0.0–0.7)
Monocytes Relative: 5 % (ref 3–12)
Neutrophils Relative %: 76 % (ref 43–77)

## 2011-06-19 LAB — GLUCOSE, CAPILLARY: Glucose-Capillary: 284 mg/dL — ABNORMAL HIGH (ref 70–99)

## 2011-06-20 LAB — GLUCOSE, CAPILLARY: Glucose-Capillary: 224 mg/dL — ABNORMAL HIGH (ref 70–99)

## 2011-06-21 NOTE — Discharge Summary (Signed)
[name], GRIEB NO.:  [id number]  MEDICAL RECORD NO.:  [id number]  LOCATION:  [id number]                         FACILITY:  [facility]  PHYSICIAN:  Birdie Riddle, M.D.  DATE OF BIRTH:  Jun 15, 1961  DATE OF ADMISSION:  06/12/2011 DATE OF DISCHARGE:  06/17/2011                              DISCHARGE SUMMARY   DISCHARGING PHYSICIAN:  Birdie Riddle, M.D.  FINAL DIAGNOSES: 1. Adrenal insufficiency. 2. Hypothyroidism. 3. Hyponatremia. 4. Diabetes mellitus type 2. 5. Anemia of chronic disease. 6. Generalized weakness.  DISCHARGE MEDICATIONS: 1. Hydrocortisone 40 mg in the morning and 20 mg in the evening. 2. Levothyroxine 100 mcg daily. 3. Multivitamin 1 daily. 4. Protonix 40 mg daily. 5. Glucotrol XL 2.5 mg twice daily. 6. Tylenol 500 mg 1 every 6 hours as needed. 7. Aspirin 81 mg daily. 8. Lantus insulin 0 units if sugar is less than 90 mg/dL, 5 units if     sugar is 91-150 mg/dL, 10 units subcutaneously if sugar is 151-200     mg/dL, 15 units if sugar is 201-250 mg/dL, and 20 units if sugar is     over 250 mg/dL.  DISCHARGE ACTIVITY:  The patient to increase activity gradually as tolerated.  FOLLOWUP:  By Dr. Dixie Dials in 1 week and by Dr. Elayne Snare in 1 month.  CONDITION ON DISCHARGE:  Improved.  DISCHARGE DIET:  Low-sodium, heart-healthy, and carbohydrate-modified and medium-calorie diet.  HISTORY:  This 50 year old Asian American female presented with weakness.  The patient had low sodium of 121.  The patient had a similar condition about a month ago.  PHYSICAL EXAMINATION:  VITAL SIGNS:  Temperature 98.1, heart rate 71, respiratory rate 18, blood pressure 145/77, oxygen saturation 100% on room air.  No orthostatic vital signs. GENERAL:  The patient is in mild distress, feeling weak and fatigued. HEENT:  The patient is normocephalic, atraumatic, no icterus sclerae. Pupils equally reactive to light.  Extraocular movements intact.   No nystagmus.  Ears, nose, throat, unremarkable.  Mucous membranes dry. NECK:  Supple. LUNGS:  Clear to auscultation bilaterally. HEART:  Normal S1 and S2. ABDOMEN:  Soft, nontender. EXTREMITIES:  No edema, cyanosis, or clubbing. SKIN:  No rash, lesion, or petechiae.  Warm and dry. PSYCHOLOGIC:  This patient is alert and oriented x3. NEUROLOGIC:  Cranial nerves II through XII grossly intact.  Muscle strength 3/5 bilaterally and symmetrical.  LABORATORY DATA:  Normal WBC count, platelet count with hemoglobin slightly low at 10.4, hematocrit 29.7.  Sodium low at 121, potassium 4, BUN 13, creatinine 0.82, CK MB normal.  Serum osmolality slightly low at 256.  Random cortisol level was 6.5.  Thyroid-stimulating hormone level was 3.86, three to four weeks ago it was 1.31.  Subsequent sodium was 128 on June 24th, and 134 on June 29th, and 135 on June 26th.  Anemia panel showed elevated iron level with a low percent saturation.  A CT of the abdomen was essentially unremarkable.  X-ray of the chest showed resolved right pleural effusion with no cardiopulmonary abnormality.  Ultrasound of the abdomen was essentially unremarkable.  HOSPITAL COURSE:  The patient was admitted to telemetry unit.  She received IV saline.  Her condition gradually improved.  Her insulin dosage required adjustment.  Her activity was gradually increased and on June 17, 2011, she was discharged with followup by me in 1 week.     Birdie Riddle, M.D.     ASK/MEDQ  D:  06/17/2011  T:  06/18/2011  Job:  [id number]  Electronically Signed by Dixie Dials M.D. on 06/21/2011 07:49:27 PM

## 2011-07-09 NOTE — H&P (Signed)
[name], SPLAN NO.:  [id number]  MEDICAL RECORD NO.:  [id number]  LOCATION:  [facility]                         FACILITY:  [facility]  PHYSICIAN:  Julieta Bellini, MDDATE OF BIRTH:  26-Dec-1960  DATE OF ADMISSION:  06/12/2011 DATE OF DISCHARGE:                             HISTORY & PHYSICAL   PRIMARY CARE PHYSICIAN:  The patient's primary care physician is Dr. Birdie Riddle  CHIEF COMPLAINT:  Generalized weakness, fatigue, lightheadedness and nausea.  HISTORY OF PRESENT ILLNESS:  The patient is a 50 year old female with a past medical history significant for hypertension, hypothyroidism, adrenal insufficiency (recently diagnosed in May 2012), and also with diabetes who came to the emergency department complaining of extreme weakness and fatigue.  The patient denies any chest pain, shortness of breath, dysuria, fever or any other complaints.  In the emergency department, she was found to have a sodium of 121 which was compatible with low sodium on her previous admission almost a month ago when she was diagnosed with adrenal insufficiency.  Due to these findings and the patient's symptoms, [facility] was called to admit the patient with this adrenal insufficient crisis.  ALLERGIES:  There is no known drug allergy.  PAST MEDICAL HISTORY:  Significant for diabetes, hypertension, hypothyroidism, adrenal insufficiency, GERD.  MEDICATIONS: 1. Hydrocortisone 10 mg 2 tablets in the morning 1 tablet in the     evening. 2. Levothyroxine 100 mcg daily. 3. Multivitamin one daily. 4. Protonix 40 mg daily, Glucotrol extended release 10 mg 1/2 tablet     twice a day. 5. Tylenol 500 mg every 6 hours as needed for pain/fever. 6. Aspirin 81 mg daily. 7. Zantac 75 mg 1 to 2 tablets daily as needed for reflux.  SOCIAL HISTORY:  There is no tobacco, alcohol or drugs.  FAMILY HISTORY:  Noncontributory.  REVIEW OF SYSTEMS:  Negative except otherwise as  mentioned in the HPI.  PHYSICAL EXAMINATION:  VITAL SIGNS:  Temperature 98.1, heart rate 71, respiratory rate 18, blood pressure 145/77, oxygen saturation 100% on room air.  When the patient arrived, blood pressure was 182/102.  There are no orthostatic vital sign abnormalities and her oxygen saturation was 100% on room air. GENERAL:  In general, the patient was lying in bed, in mild distress because she was feeling weak, fatigue and not really understanding what is going on.  There is no labored breathing.  She is cooperative to examination. HEENT:  No trauma.  Normocephalic.  Eyes without icterus.  PERRLA. Extraocular muscles intact.  No nystagmus.  Ears and nose without any discharge.  Mild dry mucous membranes.  No erythema or exudates inside her mouth. NECK:  Supple.  There is no thyromegaly or bruits. RESPIRATORY SYSTEM:  Clear to auscultation bilaterally.  No rales, crackles or wheezing. HEART:  Regular rate and rhythm.  No murmurs, gallops or rubs. ABDOMEN:  Soft, nontender, nondistended.  Positive bowel sounds. Extremities:  No cyanosis, clubbing or edema. SKIN:  There are no rashes or petechiae. NEUROLOGIC EXAM:  The patient was alert, awake and oriented x3.  Cranial nerves II through XII intact.  No focal deficit.  Muscle strength was 3/5 bilaterally symmetrically all secondary to poor  effort since the patient was feeling so weak and tired. PSYCH:  Appropriate.  LABORATORY DATA:  The patient had CMET showing a sodium of 121, potassium 4.0, chloride 84, bicarb 29, BUN 13, creatinine 0.82, blood sugar 152.  On the liver function tests, only AST at 53, ALT 55, otherwise normal.  CBC with differential showing white blood cells of 8.4, hemoglobin 10.4, platelets 157.  Her urinalysis was completely negative.  Cardiac markers x1 negative.  There are no imaging studies done at the moment of this dictation.  IMPRESSION:  Adrenal insufficiency crisis.  We are going to check  ACTH level.  We are going to check a prolactin level.  We are going to order chest x-ray PA and lateral to rule out other infection that could treat this adrenal insufficiency crisis.  We will order a CT of the adrenal glands in order to check on the morphology to rule out any other abnormalities precipitated or causing adrenal insufficiency, this is a thing that has not been done and this will be important in order to establish diagnosis and also to provide further treatment.  The patient is going to receive fluid resuscitation and we are going to start treatment with hydrocortisone 50 mg IV q.6 h. 1. Hypertension.  We are going to continue amlodipine. 2. Diabetes.  We are going to check a hemoglobin A1c.  We are going to     start the patient on sliding scale insulin moderate scale     specifically because the use of hydrocortisone.  We are going to     use Lantus 10 units subcutaneously q.h.s.  We are going to continue     her glipizide 5 mg p.o. twice a day.  The patient will have q.a.c.     and q.h.s. CBGs. 3. Hypothyroidism.  We are going to check a TSH.  We are going to     continue Synthroid, dose will be adjusted depending on the     patient's TSH results. 4. Gastroesophageal reflux disease.  We are going to continue Protonix     40 mg by mouth daily. 5. Transaminitis.  We are going to check a liver ultrasound as part of     the workup for this abnormality. 6. Anemia.  We are going to order an anemia panel. 7. Hyponatremia secondary to the adrenal insufficiency.  We are going     to start the patient on steroids and also we are going to use     normal saline fluid resuscitation.  We are going to follow the     patient's sodium trend. 8. For deep vein thrombosis  prophylaxis, we are going to use Lovenox.     Julieta Bellini, MD     CEM/MEDQ  D:  06/12/2011  T:  06/12/2011  Job:  [id number]  cc:   Birdie Riddle, M.D. [phone]  Electronically Signed by Barton Dubois MD on 07/09/2011 04:26:58 PM

## 2013-11-01 ENCOUNTER — Ambulatory Visit
Admission: RE | Admit: 2013-11-01 | Discharge: 2013-11-01 | Disposition: A | Discharge status: Home / Self Care | Payer: No Typology Code available for payment source | Source: Ambulatory Visit | Attending: Cardiovascular Disease | Admitting: Cardiovascular Disease

## 2013-11-01 ENCOUNTER — Other Ambulatory Visit: Payer: Self-pay | Admitting: Cardiovascular Disease

## 2013-11-01 DIAGNOSIS — J40 Bronchitis, not specified as acute or chronic: Secondary | ICD-10-CM

## 2014-07-11 ENCOUNTER — Emergency Department (HOSPITAL_COMMUNITY): Payer: Self-pay

## 2014-07-11 ENCOUNTER — Emergency Department (HOSPITAL_COMMUNITY)
Admission: EM | Admit: 2014-07-11 | Discharge: 2014-07-11 | Disposition: A | Discharge status: Home / Self Care | Payer: Self-pay | Attending: Emergency Medicine | Admitting: Emergency Medicine

## 2014-07-11 ENCOUNTER — Encounter (HOSPITAL_COMMUNITY): Payer: Self-pay | Admitting: Emergency Medicine

## 2014-07-11 DIAGNOSIS — S99919A Unspecified injury of unspecified ankle, initial encounter: Secondary | ICD-10-CM

## 2014-07-11 DIAGNOSIS — S8990XA Unspecified injury of unspecified lower leg, initial encounter: Secondary | ICD-10-CM | POA: Insufficient documentation

## 2014-07-11 DIAGNOSIS — W010XXA Fall on same level from slipping, tripping and stumbling without subsequent striking against object, initial encounter: Secondary | ICD-10-CM | POA: Insufficient documentation

## 2014-07-11 DIAGNOSIS — Z7982 Long term (current) use of aspirin: Secondary | ICD-10-CM | POA: Insufficient documentation

## 2014-07-11 DIAGNOSIS — Y9289 Other specified places as the place of occurrence of the external cause: Secondary | ICD-10-CM | POA: Insufficient documentation

## 2014-07-11 DIAGNOSIS — Z79899 Other long term (current) drug therapy: Secondary | ICD-10-CM | POA: Insufficient documentation

## 2014-07-11 DIAGNOSIS — E119 Type 2 diabetes mellitus without complications: Secondary | ICD-10-CM | POA: Insufficient documentation

## 2014-07-11 DIAGNOSIS — I10 Essential (primary) hypertension: Secondary | ICD-10-CM | POA: Insufficient documentation

## 2014-07-11 DIAGNOSIS — E079 Disorder of thyroid, unspecified: Secondary | ICD-10-CM | POA: Insufficient documentation

## 2014-07-11 DIAGNOSIS — Y9389 Activity, other specified: Secondary | ICD-10-CM | POA: Insufficient documentation

## 2014-07-11 DIAGNOSIS — S82141A Displaced bicondylar fracture of right tibia, initial encounter for closed fracture: Secondary | ICD-10-CM

## 2014-07-11 DIAGNOSIS — S82209A Unspecified fracture of shaft of unspecified tibia, initial encounter for closed fracture: Secondary | ICD-10-CM | POA: Insufficient documentation

## 2014-07-11 DIAGNOSIS — S99929A Unspecified injury of unspecified foot, initial encounter: Secondary | ICD-10-CM

## 2014-07-11 HISTORY — DX: Disorder of thyroid, unspecified: E07.9

## 2014-07-11 HISTORY — DX: Type 2 diabetes mellitus without complications: E11.9

## 2014-07-11 HISTORY — DX: Essential (primary) hypertension: I10

## 2014-07-11 LAB — CBG MONITORING, ED: Glucose-Capillary: 113 mg/dL — ABNORMAL HIGH (ref 70–99)

## 2014-07-11 MED ORDER — ONDANSETRON HCL 4 MG PO TABS: 4.0000 mg | ORAL_TABLET | Freq: Four times a day (QID) | ORAL | Status: DC

## 2014-07-11 MED ORDER — OXYCODONE-ACETAMINOPHEN 5-325 MG PO TABS: 2.0000 | ORAL_TABLET | ORAL | Status: DC | PRN

## 2014-07-11 MED ORDER — OXYCODONE-ACETAMINOPHEN 5-325 MG PO TABS
1.0000 | ORAL_TABLET | Freq: Once | ORAL | Status: AC
  Administered 2014-07-11: 1 via ORAL
  Filled 2014-07-11: qty 1

## 2014-07-11 MED ORDER — ONDANSETRON 4 MG PO TBDP
8.0000 mg | ORAL_TABLET | Freq: Once | ORAL | Status: AC
  Administered 2014-07-11: 8 mg via ORAL
  Filled 2014-07-11: qty 2

## 2014-07-11 NOTE — Discharge Instructions (Signed)
Please follow up closely with orthopedist specialist for further management of your injury.  Meanwhile wear knee immobilizer, use crutches and do not bear weight on affected knee.  Take pain medication as needed for pain.    Tibial Plateau Fracture with Rehab The tibial plateau is the top surface of the shinbone (tibia) and is part of the knee joint. A tibial plateau fracture is a break that occurs in this portion of the shinbone. Tibial plateau fractures are fairly common. This injury is also known as a "bumper injury," because it often occurs when a person is hit by the bumper of a car.  SYMPTOMS   Severe pain at the fracture site, at the time of injury, which may continue over a period of time.  Tenderness, inflammation, and/or bruising (contusion) over the fracture site.  Decreased knee function.  Inability to stand or walk on the injured leg.  Visible deformity, if the bone fragments are not properly aligned (displaced fracture).  Signs of vascular damage: numbness and coldness below the injury site. CAUSES  Tibial plateau fractures occur when a force is placed on the bone that is greater than it can withstand. Common causes of injury include:  Direct hit (trauma) to the tibial plateau.  Indirect trauma, such as a twisting or bending injury. RISK INCREASES WITH:  Contact sports (football, rugby, lacrosse).  Motor sports.  Bone disease (osteoporosis, bone tumor).  Metabolism disorders, hormone problems, nutritional deficiency, or eating disorders (anorexia, bulimia).  Poor strength and flexibility. PREVENTION  Warm up and stretch properly before activity.  Maintain physical fitness:  Strength, flexibility, and endurance.  Cardiovascular fitness.  Wear properly fitted and padded protective equipment (i.e. shin guards for soccer). PROGNOSIS  If treated properly, tibial plateau fractures usually heal.  RELATED COMPLICATIONS   Fracture fails to heal  (nonunion).  Fracture heals in a poor position (malunion).  Bleeding within the leg that leads to the squeezing of nerves inside a closed space (compartment syndrome), causing injury to the nerves or vessels in the leg.  Shortening of the injured bones.  Shinbone growth stopping in children.  Infection, if the skin is broken over the fracture site (open fracture).  Arthritis of the knee.  Longer healing time, if not properly treated or re-injured.  Stiff knee.  Risks of surgery: infection, bleeding, nerve damage, or damage to surrounding tissues. TREATMENT Treatment first involves the use of ice and medicine to reduce pain and inflammation. If the bone fragments are out of alignment (displaced fracture), immediate realignment of the bone (reduction) by a trained professional is required. Fractures that cannot be realigned by hand, or are open (bones poking through the skin), may require surgery to hold the fracture in place with screws, pins, and plates. Once the bone is aligned, the knee should be restrained to allow for healing. After restraint, it is important to perform strengthening and stretching exercises to help regain strength and a full range of motion. These exercises may be completed at home or with a therapist.  MEDICATION   If pain medicine is needed, nonsteroidal anti-inflammatory medicines (aspirin and ibuprofen), or other minor pain relievers (acetaminophen), are often recommended.  Do not take pain medicine for 7 days before surgery.  Prescription pain relievers may be given if your caregiver thinks they are needed. Use only as directed and only as much as you need. COLD THERAPY  Cold treatment (icing) relieves pain and reduces inflammation. Cold treatment should be applied for 10 to 15 minutes every 2  to 3 hours, and immediately after activity that aggravates your symptoms. Use ice packs or an ice massage. SEEK MEDICAL CARE IF:  Treatment does not seem to help, or  the condition gets worse.  Any medicines produce negative side effects.  Any complications from surgery occur:  Pain, numbness, or coldness in the affected leg.  Discoloration under the toenails (blue or gray) of the affected leg.  Signs of infection (fever, pain, inflammation, redness, or persistent bleeding). EXERCISES RANGE OF MOTION (ROM) AND STRETCHING EXERCISES - Tibial Plateau Fracture These exercises may help you when beginning to rehabilitate your injury. Your symptoms may go away with or without further involvement from your physician, physical therapist or athletic trainer. While completing these exercises, remember:   Restoring tissue flexibility helps normal motion to return to the joints. This allows healthier, less painful movement and activity.  An effective stretch should be held for at least 30 seconds.  A stretch should never be painful. You should only feel a gentle lengthening or release in the stretched tissue. RANGE OF MOTION - Knee Flexion and Extension, Active-Assisted  Sit on the edge of a table or chair with your thighs firmly supported. It may help to place a folded towel under the end of your right / left thigh.  Flexion (bending): Place the ankle of your healthy leg on top of the other ankle. Use your healthy leg to gently bend your right / left knee until you feel a mild tension across the top of your knee.  Hold for __________ seconds.  Extension (straightening): Switch your ankles so your right / left leg is on top. Use your healthy leg to straighten your right / left knee until you feel a mild tension on the backside of your knee.  Hold for __________ seconds. Repeat __________ times. Complete __________ times per day. RANGE OF MOTION - Knee Flexion, Active  Lie on your back with both knees straight. (If this causes back discomfort, bend the knee of your healthy leg, placing your foot flat on the floor.)  Slowly slide your right / left heel back  toward your buttocks until you feel a gentle stretch in the front of your knee or thigh.  Hold for __________ seconds. Slowly slide your heel back to the starting position. Repeat __________ times. Complete this exercise __________ times per day.  STRETCH - Knee Flexion, Supine  Lie on the floor with your right / left heel and foot lightly touching the wall (place both feet on the wall if you do not use a door frame).  Without using any effort, allow gravity to slide your foot down the wall slowly, until you feel a gentle stretch in the front of your right / left knee.  Hold this stretch for __________ seconds. Then, return the leg to the starting position, using your healthy leg for help, if needed. Repeat __________ times. Complete this stretch __________ times per day.  STRETCH - Hamstrings, Supine   Lie on your back. Loop a belt or towel over the ball of your right / left foot.  Straighten your right / left knee and slowly pull on the belt to raise your leg. Do not allow the right / left knee to bend. Keep your opposite leg flat on the floor.  Raise the leg until you feel a gentle stretch behind your right / left knee or thigh. Hold this position for __________ seconds. Repeat __________ times. Complete this stretch __________ times per day.  STRETCH - Hamstrings,  Doorway  Lie on your back with your right / left leg extended and resting on the wall, and your opposite leg flat on the ground, through the door. To start, position your bottom farther away from the wall than the picture shows.  Keep your right / left knee straight. If you feel a stretch behind your knee or thigh, hold this position for __________ seconds.  If you do not feel a stretch, scoot your bottom closer to the door, and hold for __________ seconds. Repeat __________ times. Complete this stretch __________ times per day.  STRETCH - Knee Extension Sitting  Sit with your right / left leg or heel propped on another  chair, coffee table, or foot stool.  Allow your leg muscles to relax, letting gravity straighten out your knee.*  You should feel a stretch behind your right / left knee. Hold this position for __________ seconds. Repeat __________ times. Complete this stretch __________ times per day.  *Your physician, physical therapist or athletic trainer may instruct you to place a __________ weight on your thigh, just above your kneecap, to deepen the stretch.  STRETCH - Knee Extension, Prone  Lie on your stomach on a firm surface, such as a bed or countertop. Place your right / left knee and leg just beyond the edge of the surface. You may wish to place a towel under the far end of your right / left thigh for comfort.  Relax your leg muscles and allow gravity to straighten your knee. Your caregiver may advise you to add an ankle weight, if more resistance is helpful for you.  You should feel a stretch in the back of your right / left knee. Hold this position for __________ seconds. Repeat __________ times. Complete this __________ times per day. STRETCH - Quadriceps, Prone  Lie on your stomach on a firm surface, such as a bed or padded floor.  Bend your right / left knee and grasp your ankle. If you are not able to reach your ankle or pant leg, use a belt around your foot to lengthen your reach.  Gently pull your heel toward your buttocks. Your knee should not slide out to the side. You should feel a stretch in the front of your thigh and knee.  Hold this position for __________ seconds. Repeat __________ times. Complete this stretch __________ times per day.  STRENGTHENING EXERCISES - Tibial Plateau Fracture These exercises may help you when beginning to recover from your injury. Your symptoms may go away with or without further involvement from your physician, physical therapist or athletic trainer. While completing these exercises, remember:   Muscles can gain both the endurance and the strength  needed for everyday activities through controlled exercises.  Complete these exercises as instructed by your physician, physical therapist or athletic trainer. Increase the resistance and repetitions only as guided. STRENGTH - Quadriceps, Isometrics  Lie on your back with your right / left leg extended and your opposite knee bent.  Gradually tense the muscles in the front of your right / left thigh. You should see either your knee cap slide up toward your hip or increased dimpling just above the knee. This motion will push the back of the knee down toward the floor, mat, or bed on which you are lying.  Hold the muscle as tight as you can, without increasing your pain, for __________ seconds.  Relax the muscles slowly and completely between each repetition. Repeat __________ times. Complete this exercise __________ times per day.  STRENGTH - Quadriceps, Short Arcs  Lie on your back. Place a __________ inch towel roll under your knee, so that the knee bends slightly.  Raise only your lower leg, by tightening the muscles in the front of your thigh. Do not allow your thigh to rise.  Hold this position for __________ seconds. Repeat __________ times. Complete this exercise __________ times per day.  OPTIONAL ANKLE WEIGHTS: Begin with ____________________, but DO NOT exceed ____________________. Increase in 1 pound/ 0.5 kilogram increments. STRENGTH - Quadriceps, Straight Leg Raises Quality counts! Watch for signs that the quadriceps muscle is working, to make sure you are strengthening the correct muscles and not "cheating" by substituting with healthier muscles.  Lay on your back with your right / left leg extended and your opposite knee bent.  Tense the muscles in the front of your right / left thigh. You should see your knee cap slide up, or see increased dimpling just above the knee. Your thigh may even shake a bit.  Tighten these muscles even more, and raise your leg 4 to 6 inches off  the floor. Hold for __________ seconds.  Keeping these muscles tense, lower your leg.  Relax the muscles slowly and completely between each repetition. Repeat __________ times. Complete this exercise __________ times per day.  STRENGTH - Hamstring, Isometrics   Lie on your back, on a firm surface.  Bend your right / left knee approximately __________ degrees.  Dig your heel into the surface, as if you are trying to pull it toward your buttocks. Tighten the muscles in the back of your thighs to "dig" as hard as you can, without increasing any pain.  Hold this position for __________ seconds.  Release the tension gradually and allow your muscle to completely relax for __________ seconds between each exercise. Repeat __________ times. Complete this exercise __________ times per day.  STRENGTH - Hamstring, Curls  Lay on your stomach, with your legs extended. (If you lay on a bed, your feet may hang over the edge.)  Tighten the muscles in the back of your thigh to bend your right / left knee up to 90 degrees. Keep your hips flat on the bed or floor.  Hold this position for __________ seconds.  Slowly lower your leg back to the starting position. Repeat __________ times. Complete this exercise __________ times per day.  OPTIONAL ANKLE WEIGHTS: Begin with ____________________, but DO NOT exceed ____________________. Increase in __________ increments.  STRENGTH - Hip Abductors, Straight Leg Raises Be aware of your form throughout the entire exercise, so that you exercise the correct muscles. Poor form means that you are not strengthening the correct muscles.  Lie on your side, so that your head, shoulders, knee and hip line up. You may bend your lower knee, to help maintain your balance. Your right / left leg should be on top.  Roll your hips slightly forward, so that your hips are stacked directly over each other, and your right / left knee is facing forward.  Lift your top leg up, 4-6  inches, leading with your heel. Be sure that your foot does not drift forward or that your knee does not roll toward the ceiling.  Hold this position for __________ seconds. You should feel the muscles in your outer hip lifting (you may not notice this until your leg begins to tire).  Slowly lower your leg to the starting position. Allow the muscles to fully relax before starting the next repetition. Repeat __________ times. Complete this exercise  __________ times per day.  STRENGTH - Hip Extensors, Bridge   Lie on your back, on a firm surface. Bend your knees and place your feet flat on the floor.  Tighten your buttocks muscles and lift your bottom off the floor, until your trunk is level with your thighs. You should feel the muscles in your buttocks and the back of your thighs working. If you do not feel these muscles, slide your feet 1-2 inches further away from your buttocks.  Hold this position for __________ seconds.  Slowly lower your hips to the starting position, and allow your buttock muscles to relax completely before starting the next repetition.  If this exercise is too easy, you may cross your arms over your chest. Repeat __________ times. Complete this exercise __________ times per day.  STRENGTH - Quadriceps, Squats  Stand in a door frame, so that your feet and knees are in line with the frame.  Use your hands for balance, not support, on the frame.  Slowly lower your weight, bending at the hips and knees. Keep your lower legs (below the knee) upright, so that they are parallel with the door frame. Squat only within a range that does not increase your knee pain. Never let your hips drop below your knees.  Slowly return to upright, pushing with your legs, not pulling with your hands. Repeat __________ times. Complete this exercise __________ times per day.  Document Released: 12/06/2005 Document Revised: 02/28/2012 Document Reviewed: 03/20/2009 [facility] Patient  Information 2015 [place], [place]. This information is not intended to replace advice given to you by your health care provider. Make sure you discuss any questions you have with your health care provider.

## 2014-07-11 NOTE — Progress Notes (Signed)
Orthopedic Tech Progress Note Patient Details:  [name] [id number]  Ortho Devices Type of Ortho Device: Crutches;Knee Immobilizer Ortho Device/Splint Location: RLE Ortho Device/Splint Interventions: Ordered;Application;Adjustment   Braulio Bosch 07/11/2014, 10:19 PM

## 2014-07-11 NOTE — ED Notes (Signed)
Pt repots that she fallen 2 times in  The past few days. Today when she tripped and fell and was unable to walk after d/t pain and swelling in R knee. PMS intact. Pt eating crackers because she feels like her blood sugar is low. While triaging pt, she became sick on stomach.

## 2014-07-11 NOTE — ED Provider Notes (Signed)
CSN: [id number]     Arrival date & time 07/11/14  2027 History  This chart was scribed for non-physician practitioner, Domenic Moras, PA-C, working with Fredia Sorrow, MD, by Delphia Grates, ED Scribe. This patient was seen in room TR08C/TR08C and the patient's care was started at 9:39 PM.    Chief Complaint  Patient presents with  . Leg Pain     The history is provided by the patient. No language interpreter was used.    HPI Comments: [name] is a 53 y.o. female who presents to the Emergency Department complaining of right knee pain that began today after patient slipped on a lid to a trashcan and fell to the linoleum floor. No precipitating sxs prior to the fall. Report acute onset of sharp pain to R knee, non radiating, worsening with movement.  Initially pt was able to walk, but after laying down for a short while she was unable to walk afterward. There is associated right knee swelling. She reports she is unable ambulate due to the pain. She denies hip pain, ankle pain or any other injuries. No specific treatment tried.  Patient has history of DM, HTN, and thyroid disease.   Past Medical History  Diagnosis Date  . Hypertension   . Thyroid disease   . Diabetes mellitus without complication    Past Surgical History  Procedure Laterality Date  . Cesarean section     No family history on file. History  Substance Use Topics  . Smoking status: Never Smoker   . Smokeless tobacco: Not on file  . Alcohol Use: No   OB History   Grav Para Term Preterm Abortions TAB SAB Ect Mult Living                 Review of Systems  Constitutional: Negative for fever.  Musculoskeletal: Positive for arthralgias (right knee) and joint swelling (right knee).      Allergies  Review of patient's allergies indicates no known allergies.  Home Medications   Prior to Admission medications   Medication Sig Start Date End Date Taking? Authorizing Provider  amLODipine (NORVASC) 5 MG  tablet Take 5 mg by mouth daily.   Yes Historical Provider, MD  aspirin EC 81 MG tablet Take 81 mg by mouth daily.   Yes Historical Provider, MD  glipiZIDE (GLUCOTROL XL) 5 MG 24 hr tablet Take 5 mg by mouth 2 (two) times daily.   Yes Historical Provider, MD  levothyroxine (SYNTHROID, LEVOTHROID) 150 MCG tablet Take 150 mcg by mouth daily before breakfast.   Yes Historical Provider, MD  metFORMIN (GLUCOPHAGE) 500 MG tablet Take 500 mg by mouth 2 (two) times daily with a meal.   Yes Historical Provider, MD  Multiple Vitamin (MULTIVITAMIN WITH MINERALS) TABS tablet Take 1 tablet by mouth daily.   Yes Historical Provider, MD  pantoprazole (PROTONIX) 40 MG tablet Take 40 mg by mouth daily.   Yes Historical Provider, MD   Triage Vitals: BP 146/51  Pulse 65  Temp(Src) 97.9 F (36.6 C) (Oral)  Resp 17  SpO2 100%  Physical Exam  Nursing note and vitals reviewed. Constitutional: She is oriented to person, place, and time. She appears well-developed and well-nourished. No distress.  HENT:  Head: Normocephalic and atraumatic.  Eyes: Conjunctivae and EOM are normal.  Neck: Neck supple. No tracheal deviation present.  Cardiovascular: Normal rate and intact distal pulses.   Pulmonary/Chest: Effort normal. No respiratory distress.  Musculoskeletal: She exhibits edema and tenderness.  Right hip: Normal.       Right ankle: Normal.  Right knee moderate edema noted to the medial aspect of right knee. Point tenderness to medial and lateral of knee surrounding the patella. Effusion noted. Negative anterior and posterior drawer test. Decreased knee flexion and extension secondary to pain. No overlying skin changes.  Neurological: She is alert and oriented to person, place, and time.  Sensation intact throughout R knee and distal to it.    Skin: Skin is warm and dry.  Psychiatric: She has a normal mood and affect. Her behavior is normal.    ED Course  Procedures (including critical care  time)  DIAGNOSTIC STUDIES: Oxygen Saturation is 100% on room air, normal by my interpretation.    COORDINATION OF CARE: At 2147.  Pt with R knee injury from mechanical fall.  Xray demonstrates a minimally depressed lateral tibial plateau fx of the R knee.  This is a closed injury.  Discussed treatment plan with patient which includes Percocet and knee immobilzer with crutches. Ortho referral for further care.  Patient agrees.   Labs Review Labs Reviewed  CBG MONITORING, ED - Abnormal; Notable for the following:    Glucose-Capillary 113 (*)    All other components within normal limits    Imaging Review Dg Knee Complete 4 Views Right  07/11/2014   CLINICAL DATA:  Status post fall.  Right knee pain.  EXAM: RIGHT KNEE - COMPLETE 4+ VIEW  COMPARISON:  None.  FINDINGS: The patient has a minimally depressed lateral tibial plateau fracture with an associated lipohemarthrosis. Mild degenerative change is present about the knee.  IMPRESSION: Minimally depressed lateral tibial plateau fracture right knee with an associated lipohemarthrosis.   Electronically Signed   By: Inge Rise M.D.   On: 07/11/2014 21:26     EKG Interpretation None      MDM   Final diagnoses:  Fracture, tibial plateau, right, closed, initial encounter    BP 146/51  Pulse 65  Temp(Src) 97.9 F (36.6 C) (Oral)  Resp 17  SpO2 100%  I have reviewed nursing notes and vital signs. I personally reviewed the imaging tests through PACS system  I reviewed available ER/hospitalization records thought the EMR   I personally performed the services described in this documentation, which was scribed in my presence. The recorded information has been reviewed and is accurate.     Domenic Moras, PA-C 07/11/14 2155  Domenic Moras, PA-C 07/11/14 2155

## 2014-07-12 NOTE — ED Provider Notes (Signed)
Medical screening examination/treatment/procedure(s) were performed by non-physician practitioner and as supervising physician I was immediately available for consultation/collaboration.   EKG Interpretation None        Fredia Sorrow, MD 07/12/14 [phone]

## 2016-12-20 DIAGNOSIS — D649 Anemia, unspecified: Secondary | ICD-10-CM

## 2016-12-20 HISTORY — DX: Anemia, unspecified: D64.9

## 2017-05-09 ENCOUNTER — Inpatient Hospital Stay (HOSPITAL_COMMUNITY)
Admission: EM | Admit: 2017-05-09 | Discharge: 2017-05-11 | DRG: 638 | Disposition: A | Discharge status: Home / Self Care | Payer: Self-pay | Attending: Cardiovascular Disease | Admitting: Cardiovascular Disease

## 2017-05-09 ENCOUNTER — Encounter (HOSPITAL_COMMUNITY): Payer: Self-pay

## 2017-05-09 DIAGNOSIS — Z681 Body mass index (BMI) 19 or less, adult: Secondary | ICD-10-CM

## 2017-05-09 DIAGNOSIS — E11649 Type 2 diabetes mellitus with hypoglycemia without coma: Principal | ICD-10-CM | POA: Diagnosis present

## 2017-05-09 DIAGNOSIS — D638 Anemia in other chronic diseases classified elsewhere: Secondary | ICD-10-CM | POA: Diagnosis present

## 2017-05-09 DIAGNOSIS — E1122 Type 2 diabetes mellitus with diabetic chronic kidney disease: Secondary | ICD-10-CM | POA: Diagnosis present

## 2017-05-09 DIAGNOSIS — E2740 Unspecified adrenocortical insufficiency: Secondary | ICD-10-CM | POA: Diagnosis present

## 2017-05-09 DIAGNOSIS — N183 Chronic kidney disease, stage 3 (moderate): Secondary | ICD-10-CM | POA: Diagnosis present

## 2017-05-09 DIAGNOSIS — E039 Hypothyroidism, unspecified: Secondary | ICD-10-CM | POA: Diagnosis present

## 2017-05-09 DIAGNOSIS — N179 Acute kidney failure, unspecified: Secondary | ICD-10-CM | POA: Diagnosis present

## 2017-05-09 DIAGNOSIS — I129 Hypertensive chronic kidney disease with stage 1 through stage 4 chronic kidney disease, or unspecified chronic kidney disease: Secondary | ICD-10-CM | POA: Diagnosis present

## 2017-05-09 DIAGNOSIS — Z7984 Long term (current) use of oral hypoglycemic drugs: Secondary | ICD-10-CM

## 2017-05-09 DIAGNOSIS — E162 Hypoglycemia, unspecified: Secondary | ICD-10-CM

## 2017-05-09 DIAGNOSIS — Z79899 Other long term (current) drug therapy: Secondary | ICD-10-CM

## 2017-05-09 DIAGNOSIS — E440 Moderate protein-calorie malnutrition: Secondary | ICD-10-CM | POA: Diagnosis present

## 2017-05-09 DIAGNOSIS — Z7982 Long term (current) use of aspirin: Secondary | ICD-10-CM

## 2017-05-09 LAB — COMPREHENSIVE METABOLIC PANEL
ALBUMIN: 3.9 g/dL (ref 3.5–5.0)
ALT: 23 U/L (ref 14–54)
ANION GAP: 6 (ref 5–15)
AST: 27 U/L (ref 15–41)
Alkaline Phosphatase: 130 U/L — ABNORMAL HIGH (ref 38–126)
BILIRUBIN TOTAL: 0.4 mg/dL (ref 0.3–1.2)
BUN: 25 mg/dL — ABNORMAL HIGH (ref 6–20)
CALCIUM: 9.2 mg/dL (ref 8.9–10.3)
CO2: 22 mmol/L (ref 22–32)
Chloride: 109 mmol/L (ref 101–111)
Creatinine, Ser: 1.76 mg/dL — ABNORMAL HIGH (ref 0.44–1.00)
GFR, EST AFRICAN AMERICAN: 36 mL/min — AB (ref >60)
GFR, EST NON AFRICAN AMERICAN: 31 mL/min — AB (ref >60)
Glucose, Bld: 96 mg/dL (ref 65–99)
POTASSIUM: 5.1 mmol/L (ref 3.5–5.1)
Sodium: 137 mmol/L (ref 135–145)
TOTAL PROTEIN: 7.1 g/dL (ref 6.5–8.1)

## 2017-05-09 LAB — URINALYSIS, ROUTINE W REFLEX MICROSCOPIC
BILIRUBIN URINE: NEGATIVE
GLUCOSE, UA: NEGATIVE mg/dL
KETONES UR: NEGATIVE mg/dL
NITRITE: NEGATIVE
PH: 5 (ref 5.0–8.0)
Protein, ur: NEGATIVE mg/dL
SPECIFIC GRAVITY, URINE: 1.012 (ref 1.005–1.030)

## 2017-05-09 LAB — CBC
HEMATOCRIT: 26.7 % — AB (ref 36.0–46.0)
HEMOGLOBIN: 8.8 g/dL — AB (ref 12.0–15.0)
MCH: 28.5 pg (ref 26.0–34.0)
MCHC: 33 g/dL (ref 30.0–36.0)
MCV: 86.4 fL (ref 78.0–100.0)
Platelets: 175 10*3/uL (ref 150–400)
RBC: 3.09 MIL/uL — AB (ref 3.87–5.11)
RDW: 12.9 % (ref 11.5–15.5)
WBC: 6.2 10*3/uL (ref 4.0–10.5)

## 2017-05-09 LAB — CBG MONITORING, ED
Glucose-Capillary: 58 mg/dL — ABNORMAL LOW (ref 65–99)
Glucose-Capillary: 89 mg/dL (ref 65–99)

## 2017-05-09 MED ORDER — DEXTROSE 50 % IV SOLN
1.0000 | INTRAVENOUS | Status: DC | PRN
  Administered 2017-05-09: 50 mL via INTRAVENOUS
  Filled 2017-05-09: qty 50

## 2017-05-09 NOTE — ED Provider Notes (Signed)
[facility] DEPT Provider Note   CSN: [id number] Arrival date & time: 05/09/17  2135     History   Chief Complaint Chief Complaint  Patient presents with  . Hypoglycemia    HPI [name] is a 56 y.o. female.  HPI 56 year old female presents with hyperglycemia. Family noted that the patient was acting confused 2 hours prior to arrival. They report that the patient's glucometer is broken and therefore unable to check her sugars. EMS was called which did note hypoglycemia but they did not report how low. Patient was given glucagon which improved her CBG to 83. Following this patient's mental status improves. She is currently back to baseline per family. Patient has no acute complaints at this time. They deny any recent fevers, illnesses, infections. Patient is currently taking glipizide and metformin. They report that the patient is eating appropriately.  They do report that her medicine has not been changed in over 5 years.   Past Medical History:  Diagnosis Date  . Diabetes mellitus without complication ([place])   . Hypertension   . Thyroid disease     There are no active problems to display for this patient.   Past Surgical History:  Procedure Laterality Date  . CESAREAN SECTION      OB History    No data available       Home Medications    Prior to Admission medications   Medication Sig Start Date End Date Taking? Authorizing Provider  amLODipine (NORVASC) 5 MG tablet Take 5 mg by mouth daily.   Yes [provider]  aspirin 81 MG chewable tablet Chew 81 mg by mouth daily.   Yes [provider]  glipiZIDE (GLUCOTROL XL) 5 MG 24 hr tablet Take 5 mg by mouth 2 (two) times daily.   Yes [provider]  hydrocortisone (CORTEF) 5 MG tablet Take 5 mg by mouth 2 (two) times daily.   Yes [provider]  levothyroxine (SYNTHROID, LEVOTHROID) 125 MCG tablet Take 125 mcg by mouth daily before breakfast.   Yes [provider]    metFORMIN (GLUCOPHAGE) 500 MG tablet Take 500 mg by mouth 2 (two) times daily with a meal.   Yes [provider]  Multiple Vitamin (MULTIVITAMIN WITH MINERALS) TABS tablet Take 1 tablet by mouth daily.   Yes [provider]  pantoprazole (PROTONIX) 40 MG tablet Take 40 mg by mouth daily.   Yes [provider]    Family History History reviewed. No pertinent family history.  Social History Social History  Substance Use Topics  . Smoking status: Never Smoker  . Smokeless tobacco: Not on file  . Alcohol use No     Allergies   Patient has no known allergies.   Review of Systems Review of Systems All other systems are reviewed and are negative for acute change except as noted in the HPI  Physical Exam Updated Vital Signs BP (!) 187/55 (BP Location: Right Arm)   Pulse 63   Temp 98 F (36.7 C) (Oral)   Resp 18   SpO2 100%   Physical Exam  Constitutional: She is oriented to person, place, and time. She appears well-developed and well-nourished. No distress.  HENT:  Head: Normocephalic and atraumatic.  Nose: Nose normal.  Eyes: Conjunctivae and EOM are normal. Pupils are equal, round, and reactive to light. Right eye exhibits no discharge. Left eye exhibits no discharge. No scleral icterus.  Neck: Normal range of motion. Neck supple.  Cardiovascular: Normal rate and regular  rhythm.  Exam reveals no gallop and no friction rub.   No murmur heard. Pulmonary/Chest: Effort normal and breath sounds normal. No stridor. No respiratory distress. She has no rales.  Abdominal: Soft. She exhibits no distension. There is no tenderness.  Musculoskeletal: She exhibits no edema or tenderness.  Neurological: She is alert and oriented to person, place, and time.  Skin: Skin is warm and dry. No rash noted. She is not diaphoretic. No erythema.  Psychiatric: She has a normal mood and affect.  Vitals reviewed.    ED Treatments / Results  Labs (all labs ordered are  listed, but only abnormal results are displayed) Labs Reviewed  CBC - Abnormal; Notable for the following:       Result Value   RBC 3.09 (*)    Hemoglobin 8.8 (*)    HCT 26.7 (*)    All other components within normal limits  COMPREHENSIVE METABOLIC PANEL - Abnormal; Notable for the following:    BUN 25 (*)    Creatinine, Ser 1.76 (*)    Alkaline Phosphatase 130 (*)    GFR calc non Af Amer 31 (*)    GFR calc Af Amer 36 (*)    All other components within normal limits  URINALYSIS, ROUTINE W REFLEX MICROSCOPIC - Abnormal; Notable for the following:    Hgb urine dipstick SMALL (*)    Leukocytes, UA SMALL (*)    Bacteria, UA RARE (*)    Squamous Epithelial / LPF 0-5 (*)    All other components within normal limits  CBG MONITORING, ED - Abnormal; Notable for the following:    Glucose-Capillary 58 (*)    All other components within normal limits  CBG MONITORING, ED  CBG MONITORING, ED  CBG MONITORING, ED    EKG  EKG Interpretation None       Radiology No results found.  Procedures Procedures (including critical care time)  Medications Ordered in ED Medications  dextrose 50 % solution 50 mL (50 mLs Intravenous Given 05/09/17 2310)  dextrose 5 % solution (not administered)     Initial Impression / Assessment and Plan / ED Course  I have reviewed the triage vital signs and the nursing notes.  Pertinent labs & imaging results that were available during my care of the patient were reviewed by me and considered in my medical decision making (see chart for details).     Hypoglycemia with CBG 58 upon arrival. Encourage by mouth food intake. The patient given D50 with improved sugars. We'll place patient on D5 drip. Given the oral antihypertensive agent, patient will require admission for further management.  Final Clinical Impressions(s) / ED Diagnoses   Final diagnoses:  Hypoglycemia     Burnis Halling, Grayce Sessions, MD 05/10/17 [phone]

## 2017-05-09 NOTE — ED Notes (Signed)
Pt given orange juice and tolerating it well

## 2017-05-09 NOTE — ED Notes (Signed)
Bed: SJ62 Expected date:  Expected time:  Means of arrival:  Comments: Hypoglycemic, HTN

## 2017-05-09 NOTE — ED Notes (Signed)
Pt ambulated to the restroom to get urine sample without needing assistance. Will start IV when she returns

## 2017-05-09 NOTE — ED Triage Notes (Signed)
Pt BIB [facility] from home c/o hypoglycemia. She states that her glucometer is broken so she wasn't sure that it was low, but her daughter states that she was acting differently. CBG 83 with EMS. No complaints at this time other than not knowing why her sugar dropped. Hx HTN and DM. A&Ox4.

## 2017-05-10 ENCOUNTER — Encounter (HOSPITAL_COMMUNITY): Payer: Self-pay | Admitting: *Deleted

## 2017-05-10 DIAGNOSIS — E440 Moderate protein-calorie malnutrition: Secondary | ICD-10-CM | POA: Insufficient documentation

## 2017-05-10 DIAGNOSIS — E11649 Type 2 diabetes mellitus with hypoglycemia without coma: Secondary | ICD-10-CM | POA: Diagnosis present

## 2017-05-10 LAB — CBC
HEMATOCRIT: 24.5 % — AB (ref 36.0–46.0)
Hemoglobin: 8.4 g/dL — ABNORMAL LOW (ref 12.0–15.0)
MCH: 29.3 pg (ref 26.0–34.0)
MCHC: 34.3 g/dL (ref 30.0–36.0)
MCV: 85.4 fL (ref 78.0–100.0)
Platelets: 164 10*3/uL (ref 150–400)
RBC: 2.87 MIL/uL — ABNORMAL LOW (ref 3.87–5.11)
RDW: 12.6 % (ref 11.5–15.5)
WBC: 5.4 10*3/uL (ref 4.0–10.5)

## 2017-05-10 LAB — GLUCOSE, CAPILLARY
GLUCOSE-CAPILLARY: 85 mg/dL (ref 65–99)
GLUCOSE-CAPILLARY: 148 mg/dL — AB (ref 65–99)
Glucose-Capillary: 113 mg/dL — ABNORMAL HIGH (ref 65–99)
Glucose-Capillary: 124 mg/dL — ABNORMAL HIGH (ref 65–99)
Glucose-Capillary: 91 mg/dL (ref 65–99)
Glucose-Capillary: 97 mg/dL (ref 65–99)

## 2017-05-10 LAB — CBG MONITORING, ED: GLUCOSE-CAPILLARY: 125 mg/dL — AB (ref 65–99)

## 2017-05-10 LAB — BASIC METABOLIC PANEL
ANION GAP: 6 (ref 5–15)
BUN: 23 mg/dL — AB (ref 6–20)
CALCIUM: 8.8 mg/dL — AB (ref 8.9–10.3)
CO2: 22 mmol/L (ref 22–32)
Chloride: 106 mmol/L (ref 101–111)
Creatinine, Ser: 1.88 mg/dL — ABNORMAL HIGH (ref 0.44–1.00)
GFR calc Af Amer: 33 mL/min — ABNORMAL LOW (ref >60)
GFR, EST NON AFRICAN AMERICAN: 29 mL/min — AB (ref >60)
Glucose, Bld: 160 mg/dL — ABNORMAL HIGH (ref 65–99)
POTASSIUM: 4.5 mmol/L (ref 3.5–5.1)
SODIUM: 134 mmol/L — AB (ref 135–145)

## 2017-05-10 LAB — TSH: TSH: 3.193 u[IU]/mL (ref 0.350–4.500)

## 2017-05-10 LAB — HIV ANTIBODY (ROUTINE TESTING W REFLEX): HIV Screen 4th Generation wRfx: NONREACTIVE

## 2017-05-10 LAB — IRON AND TIBC
IRON: 41 ug/dL (ref 28–170)
Saturation Ratios: 15 % (ref 10.4–31.8)
TIBC: 267 ug/dL (ref 250–450)
UIBC: 226 ug/dL

## 2017-05-10 MED ORDER — INSULIN ASPART 100 UNIT/ML ~~LOC~~ SOLN
0.0000 [IU] | Freq: Three times a day (TID) | SUBCUTANEOUS | Status: DC
  Administered 2017-05-10: 1 [IU] via SUBCUTANEOUS

## 2017-05-10 MED ORDER — ADULT MULTIVITAMIN W/MINERALS CH
1.0000 | ORAL_TABLET | Freq: Every day | ORAL | Status: DC
  Administered 2017-05-10 – 2017-05-11 (×2): 1 via ORAL
  Filled 2017-05-10 (×2): qty 1

## 2017-05-10 MED ORDER — PANTOPRAZOLE SODIUM 40 MG PO TBEC
40.0000 mg | DELAYED_RELEASE_TABLET | Freq: Every day | ORAL | Status: DC
  Administered 2017-05-10 – 2017-05-11 (×2): 40 mg via ORAL
  Filled 2017-05-10 (×2): qty 1

## 2017-05-10 MED ORDER — DEXTROSE 5 % IV SOLN
Freq: Once | INTRAVENOUS | Status: AC
  Administered 2017-05-10: 02:00:00 via INTRAVENOUS

## 2017-05-10 MED ORDER — HYDROCORTISONE 5 MG PO TABS
5.0000 mg | ORAL_TABLET | Freq: Two times a day (BID) | ORAL | Status: DC
  Administered 2017-05-10 – 2017-05-11 (×3): 5 mg via ORAL
  Filled 2017-05-10 (×3): qty 1

## 2017-05-10 MED ORDER — HYDROCODONE-ACETAMINOPHEN 5-325 MG PO TABS
1.0000 | ORAL_TABLET | Freq: Four times a day (QID) | ORAL | Status: DC | PRN
  Administered 2017-05-10: 1 via ORAL
  Filled 2017-05-10: qty 1

## 2017-05-10 MED ORDER — AMLODIPINE BESYLATE 5 MG PO TABS
5.0000 mg | ORAL_TABLET | Freq: Every day | ORAL | Status: DC
  Administered 2017-05-10 – 2017-05-11 (×2): 5 mg via ORAL
  Filled 2017-05-10 (×2): qty 1

## 2017-05-10 MED ORDER — MUSCLE RUB 10-15 % EX CREA
1.0000 "application " | TOPICAL_CREAM | CUTANEOUS | Status: DC | PRN
  Administered 2017-05-10: 1 via TOPICAL
  Filled 2017-05-10: qty 85

## 2017-05-10 MED ORDER — SODIUM CHLORIDE 0.9% FLUSH
3.0000 mL | Freq: Two times a day (BID) | INTRAVENOUS | Status: DC
  Administered 2017-05-10: 3 mL via INTRAVENOUS

## 2017-05-10 MED ORDER — SODIUM CHLORIDE 0.9 % IV SOLN
INTRAVENOUS | Status: DC
  Administered 2017-05-10 (×2): via INTRAVENOUS

## 2017-05-10 MED ORDER — HEPARIN SODIUM (PORCINE) 5000 UNIT/ML IJ SOLN
5000.0000 [IU] | Freq: Three times a day (TID) | INTRAMUSCULAR | Status: DC
  Administered 2017-05-10 – 2017-05-11 (×5): 5000 [IU] via SUBCUTANEOUS
  Filled 2017-05-10 (×5): qty 1

## 2017-05-10 MED ORDER — LEVOTHYROXINE SODIUM 125 MCG PO TABS
125.0000 ug | ORAL_TABLET | Freq: Every day | ORAL | Status: DC
  Administered 2017-05-10 – 2017-05-11 (×2): 125 ug via ORAL
  Filled 2017-05-10 (×2): qty 1

## 2017-05-10 MED ORDER — ASPIRIN 81 MG PO CHEW
81.0000 mg | CHEWABLE_TABLET | Freq: Every day | ORAL | Status: DC
  Administered 2017-05-10 – 2017-05-11 (×2): 81 mg via ORAL
  Filled 2017-05-10 (×2): qty 1

## 2017-05-10 NOTE — H&P (Signed)
Referring Physician:  Kennethia Lynes is an 56 y.o. female.                       Chief Complaint: Hypoglycemia  HPI: 56 yr old female with PMH of DM type II, Hypothyroidism, adrenal insufficiency, anemia of chronic disease and hypertension  Past Medical History:  Diagnosis Date  . Diabetes mellitus without complication ([place])   . Hypertension   . Thyroid disease       Past Surgical History:  Procedure Laterality Date  . CESAREAN SECTION      History reviewed. No pertinent family history. Social History:  reports that she has never smoked. She does not have any smokeless tobacco history on file. She reports that she does not drink alcohol or use drugs.  Allergies: No Known Allergies   (Not in a hospital admission)  Results for orders placed or performed during the hospital encounter of 05/09/17 (from the past 48 hour(s))  CBG monitoring, ED     Status: Abnormal   Collection Time: 05/09/17  9:47 PM  Result Value Ref Range   Glucose-Capillary 58 (L) 65 - 99 mg/dL  Urinalysis, Routine w reflex microscopic     Status: Abnormal   Collection Time: 05/09/17 10:41 PM  Result Value Ref Range   Color, Urine YELLOW YELLOW   APPearance CLEAR CLEAR   Specific Gravity, Urine 1.012 1.005 - 1.030   pH 5.0 5.0 - 8.0   Glucose, UA NEGATIVE NEGATIVE mg/dL   Hgb urine dipstick SMALL (A) NEGATIVE   Bilirubin Urine NEGATIVE NEGATIVE   Ketones, ur NEGATIVE NEGATIVE mg/dL   Protein, ur NEGATIVE NEGATIVE mg/dL   Nitrite NEGATIVE NEGATIVE   Leukocytes, UA SMALL (A) NEGATIVE   RBC / HPF 0-5 0 - 5 RBC/hpf   WBC, UA 0-5 0 - 5 WBC/hpf   Bacteria, UA RARE (A) NONE SEEN   Squamous Epithelial / LPF 0-5 (A) NONE SEEN   Mucous PRESENT   CBC     Status: Abnormal   Collection Time: 05/09/17 11:05 PM  Result Value Ref Range   WBC 6.2 4.0 - 10.5 K/uL   RBC 3.09 (L) 3.87 - 5.11 MIL/uL   Hemoglobin 8.8 (L) 12.0 - 15.0 g/dL   HCT 26.7 (L) 36.0 - 46.0 %   MCV 86.4 78.0 - 100.0 fL   MCH 28.5 26.0 -  34.0 pg   MCHC 33.0 30.0 - 36.0 g/dL   RDW 12.9 11.5 - 15.5 %   Platelets 175 150 - 400 K/uL  Comprehensive metabolic panel     Status: Abnormal   Collection Time: 05/09/17 11:05 PM  Result Value Ref Range   Sodium 137 135 - 145 mmol/L   Potassium 5.1 3.5 - 5.1 mmol/L   Chloride 109 101 - 111 mmol/L   CO2 22 22 - 32 mmol/L   Glucose, Bld 96 65 - 99 mg/dL   BUN 25 (H) 6 - 20 mg/dL   Creatinine, Ser 1.76 (H) 0.44 - 1.00 mg/dL   Calcium 9.2 8.9 - 10.3 mg/dL   Total Protein 7.1 6.5 - 8.1 g/dL   Albumin 3.9 3.5 - 5.0 g/dL   AST 27 15 - 41 U/L   ALT 23 14 - 54 U/L   Alkaline Phosphatase 130 (H) 38 - 126 U/L   Total Bilirubin 0.4 0.3 - 1.2 mg/dL   GFR calc non Af Amer 31 (L) >60 mL/min   GFR calc Af Amer 36 (L) >60 mL/min  Comment: (NOTE) The eGFR has been calculated using the CKD EPI equation. This calculation has not been validated in all clinical situations. eGFR's persistently <60 mL/min signify possible Chronic Kidney Disease.    Anion gap 6 5 - 15  CBG monitoring, ED (now and then every hour for 3 hours)     Status: None   Collection Time: 05/09/17 11:16 PM  Result Value Ref Range   Glucose-Capillary 89 65 - 99 mg/dL   No results found.  Review Of Systems Constitutional: No fever, chills, chronic weight loss. Eyes: No vision change, wears glasses. No discharge or pain. Ears: No hearing loss, No tinnitus. Respiratory: No asthma, COPD, pneumonias. No shortness of breath. No hemoptysis. Cardiovascular: No chest pain, palpitation, leg edema. Gastrointestinal: No nausea, vomiting, diarrhea, constipation. No GI bleed. No hepatitis. Genitourinary: No dysuria, hematuria, kidney stone. No incontinance. Neurological: No headache, stroke, seizures.  Psychiatry: No psych facility admission for anxiety, depression, suicide. No detox. Skin: No rash. Musculoskeletal: No joint pain, fibromyalgia. No neck pain, back pain. Lymphadenopathy: No lymphadenopathy. Hematology: Positive  anemia or easy bruising.   Blood pressure (!) 187/55, pulse 63, temperature 98 F (36.7 C), temperature source Oral, resp. rate 18, SpO2 100 %. There is no height or weight on file to calculate BMI. General appearance: alert, cooperative, appears stated age and no distress Head: Normocephalic, atraumatic. Eyes: Brown eyes, pink conjunctiva, corneas clear. PERRL, EOM's intact. Neck: No adenopathy, no carotid bruit, no JVD, supple, symmetrical, trachea midline and thyroid not enlarged. Resp: Clear to auscultation bilaterally. Cardio: Regular rate and rhythm, S1, S2 normal, II/VI systolic murmur, no click, rub or gallop GI: Soft, non-tender; bowel sounds normal; no organomegaly. Extremities: No edema, cyanosis or clubbing. Skin: Warm and dry.  Neurologic: Alert and oriented X 3, normal strength. Normal coordination and gait.  Assessment/Plan Hypoglycemia in a type 2 diabetic patient Acute renal failure Probably pre-renal azotemia Hypothyroidism Adrenal insufficiency Anemia of chronic disease  Agree with supportive treatment Hold glipizide and metformin Check Hgb A1C, TSH and S. Iron also.   Birdie Riddle, MD  05/10/2017, 1:15 AM

## 2017-05-10 NOTE — Progress Notes (Signed)
Initial Nutrition Assessment  DOCUMENTATION CODES:   Non-severe (moderate) malnutrition in context of chronic illness  INTERVENTION:   Magic cup TID with meals, each supplement provides 290 kcal and 9 grams of protein  Snacks  MVI  NUTRITION DIAGNOSIS:   Malnutrition (moderate) related to chronic illness, DM, anemia as evidenced by moderate depletions of muscle mass, moderate depletion of body fat.  GOAL:   Patient will meet greater than or equal to 90% of their needs  MONITOR:   PO intake, Supplement acceptance, Labs, Weight trends  REASON FOR ASSESSMENT:   Other (Comment) (low BMI)    ASSESSMENT:   56 yr old female with PMH of DM type II, Hypothyroidism, adrenal insufficiency, anemia of chronic disease and hypertension presents for hyperglycemia   Met with pt in room today. Pt reports intermittent poor appetite and oral intake for the past several years. Pt reports stable weight; pt reports that she has been thin her whole life. Pt currently eating 75-100% of meals. Pt requesting snacks instead of supplements.   Medications reviewed and include: aspirin, heparin, insulin, synthroid, MVI, protonix  Labs reviewed: Na 134(L), BUN 23(H), creat 1.88(H), Ca 8.8(L) Hgb 8.4(L), Hct 24.5(L)  Nutrition-Focused physical exam completed. Findings are moderate fat depletion in chest and arms, moderate muscle depletion over entire body, and no edema.   Diet Order:  Diet heart healthy/carb modified Room service appropriate? Yes; Fluid consistency: Thin  Skin:  Reviewed, no issues  Last BM:  5/20  Height:   Ht Readings from Last 1 Encounters:  05/10/17 '5\' 6"'$  (1.676 m)    Weight:   Wt Readings from Last 1 Encounters:  05/10/17 106 lb 9.6 oz (48.4 kg)    Ideal Body Weight:  59 kg  BMI:  Body mass index is 17.21 kg/m.  Estimated Nutritional Needs:   Kcal:  1450-1650kcal/day   Protein:  72-82g/day   Fluid:  >1.4L/day   EDUCATION NEEDS:   No education needs  identified at this time  Koleen Distance MS, RD, LDN Pager #- [phone]

## 2017-05-11 ENCOUNTER — Encounter (HOSPITAL_COMMUNITY): Payer: Self-pay | Admitting: *Deleted

## 2017-05-11 LAB — BASIC METABOLIC PANEL
Anion gap: 5 (ref 5–15)
BUN: 26 mg/dL — AB (ref 6–20)
CALCIUM: 9.1 mg/dL (ref 8.9–10.3)
CO2: 20 mmol/L — ABNORMAL LOW (ref 22–32)
CREATININE: 1.77 mg/dL — AB (ref 0.44–1.00)
Chloride: 115 mmol/L — ABNORMAL HIGH (ref 101–111)
GFR calc Af Amer: 36 mL/min — ABNORMAL LOW (ref >60)
GFR, EST NON AFRICAN AMERICAN: 31 mL/min — AB (ref >60)
Glucose, Bld: 125 mg/dL — ABNORMAL HIGH (ref 65–99)
Potassium: 4.9 mmol/L (ref 3.5–5.1)
Sodium: 140 mmol/L (ref 135–145)

## 2017-05-11 LAB — HEMOGLOBIN A1C
HEMOGLOBIN A1C: 5.6 % (ref 4.8–5.6)
Mean Plasma Glucose: 114 mg/dL

## 2017-05-11 LAB — GLUCOSE, CAPILLARY: Glucose-Capillary: 114 mg/dL — ABNORMAL HIGH (ref 65–99)

## 2017-05-11 MED ORDER — GLIPIZIDE 5 MG PO TABS: 2.5000 mg | ORAL_TABLET | Freq: Two times a day (BID) | ORAL | Status: DC

## 2017-05-11 MED ORDER — GLIPIZIDE 5 MG PO TABS: 2.5000 mg | ORAL_TABLET | Freq: Every day | ORAL | 3 refills | Status: DC

## 2017-05-11 MED ORDER — GLIPIZIDE 5 MG PO TABS: 2.5000 mg | ORAL_TABLET | Freq: Every day | ORAL | Status: DC

## 2017-05-11 NOTE — Discharge Summary (Signed)
Physician Discharge Summary  Patient ID: [name] MRN: [id number] DOB/AGE: 01/12/1961 56 y.o.  Admit date: 05/09/2017 Discharge date: 05/11/2017  Admission Diagnoses: Hypoglycemia in type II diabetic patient Acute renal failure Hypothyroidism Adrenal insufficiency Anemia of chronic disease  Discharge Diagnoses:  Principle problem: Hypoglycemia associated with diabetes Active Problems:   Hypertension   Hypothyroidism   Chronic Adrenal insufficiency   Anemia of chronic disease   CKD, III   Malnutrition of moderate degree  Discharged Condition: fair  Hospital Course: 56 yr old Asian [place] with type II DM, adrenal insufficiency, hypothyroidism had hypoglycemic episode. Her renal function had deteriorated and did not change much post hydration. Her metformin was discontinued and Glipizide was decreased by 75 %. She ambulated well and was discharged home with follow up by me in 1 week.  Consults: cardiology  Significant Diagnostic Studies: labs: Normal S. Iron, TSH and Hgb A1C. CBC showed low Hgb of 8.3 otherwise unremarkable. BMET near normal except BUN of 25 and Creatinine of 1.76.  Additional work up on OP basis.  Treatments: Glipizide 2.5 mg. Daily, amlodipine 5 mg qd, Hydrocortisone 5 mg. bid, Levothyroxine 125 mcg qd, multivitamin and pantoprazole and IV fluids.  Discharge Exam: Blood pressure (!) 161/47, pulse 66, temperature 97.6 F (36.4 C), temperature source Oral, resp. rate 16, height 5\' 6"  (1.676 m), weight 49.5 kg (109 lb 2 oz), SpO2 100 %. General appearance: alert, cooperative and appears stated age. Head: Normocephalic, atraumatic. Eyes: Brown eyes, pale conjunctiva, corneas clear. PERRL, EOM's intact.  Neck: No adenopathy, no carotid bruit, no JVD, supple, symmetrical, trachea midline and thyroid not enlarged. Resp: Clear to auscultation bilaterally. Cardio: Regular rate and rhythm, S1, S2 normal, II/VI systolic murmur, no click, rub or gallop. GI:  Soft, non-tender; bowel sounds normal; no organomegaly. Extremities: No edema, cyanosis or clubbing. Skin: Warm and dry.  Neurologic: Alert and oriented X 3, normal strength and tone. Normal coordination and slow gait.  Disposition: 01-Home or Self Care   Allergies as of 05/11/2017      Reactions   Percocet [oxycodone-acetaminophen] Rash      Medication List    STOP taking these medications   glipiZIDE 5 MG 24 hr tablet Commonly known as:  GLUCOTROL XL Replaced by:  glipiZIDE 5 MG tablet   metFORMIN 500 MG tablet Commonly known as:  GLUCOPHAGE     TAKE these medications   amLODipine 5 MG tablet Commonly known as:  NORVASC Take 5 mg by mouth daily.   aspirin 81 MG chewable tablet Chew 81 mg by mouth daily.   glipiZIDE 5 MG tablet Commonly known as:  GLUCOTROL Take 0.5 tablets (2.5 mg total) by mouth daily before breakfast. Start taking on:  05/12/2017 Replaces:  glipiZIDE 5 MG 24 hr tablet   hydrocortisone 5 MG tablet Commonly known as:  CORTEF Take 5 mg by mouth 2 (two) times daily.   levothyroxine 125 MCG tablet Commonly known as:  SYNTHROID, LEVOTHROID Take 125 mcg by mouth daily before breakfast.   multivitamin with minerals Tabs tablet Take 1 tablet by mouth daily.   pantoprazole 40 MG tablet Commonly known as:  PROTONIX Take 40 mg by mouth daily.      Follow-up Information    Dixie Dials, MD. Schedule an appointment as soon as possible for a visit in 1 week(s).   Specialty:  Cardiology Contact information: [place] [address] [phone]           Signed: Birdie Riddle 05/11/2017, 11:49  AM

## 2017-06-17 ENCOUNTER — Encounter (HOSPITAL_COMMUNITY): Payer: Self-pay | Admitting: Emergency Medicine

## 2017-06-17 ENCOUNTER — Emergency Department (HOSPITAL_COMMUNITY)
Admission: EM | Admit: 2017-06-17 | Discharge: 2017-06-17 | Disposition: A | Discharge status: Home / Self Care | Payer: Self-pay | Attending: Emergency Medicine | Admitting: Emergency Medicine

## 2017-06-17 DIAGNOSIS — H6122 Impacted cerumen, left ear: Secondary | ICD-10-CM | POA: Insufficient documentation

## 2017-06-17 DIAGNOSIS — E119 Type 2 diabetes mellitus without complications: Secondary | ICD-10-CM | POA: Insufficient documentation

## 2017-06-17 DIAGNOSIS — Z79899 Other long term (current) drug therapy: Secondary | ICD-10-CM | POA: Insufficient documentation

## 2017-06-17 DIAGNOSIS — I10 Essential (primary) hypertension: Secondary | ICD-10-CM | POA: Insufficient documentation

## 2017-06-17 DIAGNOSIS — Z7984 Long term (current) use of oral hypoglycemic drugs: Secondary | ICD-10-CM | POA: Insufficient documentation

## 2017-06-17 DIAGNOSIS — H66001 Acute suppurative otitis media without spontaneous rupture of ear drum, right ear: Secondary | ICD-10-CM | POA: Insufficient documentation

## 2017-06-17 DIAGNOSIS — Z7982 Long term (current) use of aspirin: Secondary | ICD-10-CM | POA: Insufficient documentation

## 2017-06-17 LAB — CBG MONITORING, ED: GLUCOSE-CAPILLARY: 133 mg/dL — AB (ref 65–99)

## 2017-06-17 MED ORDER — DOCUSATE SODIUM 50 MG/5ML PO LIQD
100.0000 mg | Freq: Once | ORAL | Status: AC
Start: 2017-06-17 — End: 2017-06-17
  Administered 2017-06-17: 100 mg via ORAL
  Filled 2017-06-17: qty 10

## 2017-06-17 MED ORDER — AMOXICILLIN 500 MG PO CAPS
1000.0000 mg | ORAL_CAPSULE | Freq: Once | ORAL | Status: AC
  Administered 2017-06-17: 1000 mg via ORAL
  Filled 2017-06-17: qty 2

## 2017-06-17 MED ORDER — AMOXICILLIN 500 MG PO CAPS: 1000.0000 mg | ORAL_CAPSULE | Freq: Two times a day (BID) | ORAL | 0 refills | Status: DC

## 2017-06-17 NOTE — ED Provider Notes (Signed)
[facility] DEPT Provider Note   CSN: [id number] Arrival date & time: 06/17/17  1745     History   Chief Complaint Chief Complaint  Patient presents with  . Otalgia    HPI   Blood pressure (!) 182/49, pulse 68, temperature 98.3 F (36.8 C), temperature source Oral, resp. rate 16, SpO2 100 %.  [name] is a 56 y.o. female this year diabetic with hypertension complaining of resolved cold with sore throat and subsequent right ear pain and left ear clogging. She denies any fever, chills, neck pain, nausea vomiting any cough or shortness of breath or chest pain. She reports reduced hearing bilaterally.  Past Medical History:  Diagnosis Date  . Diabetes mellitus without complication ([place])   . Hypertension   . Thyroid disease     Patient Active Problem List   Diagnosis Date Noted  . Hypoglycemia associated with diabetes ([place]) 05/10/2017  . Malnutrition of moderate degree 05/10/2017    Past Surgical History:  Procedure Laterality Date  . CESAREAN SECTION      OB History    No data available       Home Medications    Prior to Admission medications   Medication Sig Start Date End Date Taking? Authorizing Provider  amLODipine (NORVASC) 5 MG tablet Take 5 mg by mouth daily.    [provider]  amoxicillin (AMOXIL) 500 MG capsule Take 2 capsules (1,000 mg total) by mouth 2 (two) times daily. 06/17/17   Madonna Flegal, Elmyra Ricks, PA-C  aspirin 81 MG chewable tablet Chew 81 mg by mouth daily.    [provider]  glipiZIDE (GLUCOTROL) 5 MG tablet Take 0.5 tablets (2.5 mg total) by mouth daily before breakfast. 05/12/17   Dixie Dials, MD  hydrocortisone (CORTEF) 5 MG tablet Take 5 mg by mouth 2 (two) times daily.    [provider]  levothyroxine (SYNTHROID, LEVOTHROID) 125 MCG tablet Take 125 mcg by mouth daily before breakfast.    [provider]  Multiple Vitamin (MULTIVITAMIN WITH MINERALS) TABS tablet Take 1 tablet by mouth daily.     [provider]  pantoprazole (PROTONIX) 40 MG tablet Take 40 mg by mouth daily.    [provider]    Family History No family history on file.  Social History Social History  Substance Use Topics  . Smoking status: Never Smoker  . Smokeless tobacco: Not on file  . Alcohol use No     Allergies   Percocet [oxycodone-acetaminophen]   Review of Systems Review of Systems  A complete review of systems was obtained and all systems are negative except as noted in the HPI and PMH.    Physical Exam Updated Vital Signs BP (!) 162/61 (BP Location: Left Arm) Comment: patient has a history of high BP, currently taking medication  Pulse 60   Temp 98.1 F (36.7 C) (Oral)   Resp 16   SpO2 100%   Physical Exam  Constitutional: She is oriented to person, place, and time. She appears well-developed and well-nourished. No distress.  HENT:  Head: Normocephalic and atraumatic.  Mouth/Throat: Oropharynx is clear and moist.  Eyes: Conjunctivae and EOM are normal. Pupils are equal, round, and reactive to light.  Left-sided cerumen impaction, right tympanic membrane erythematous and bulging with dull light reflex, after ear canal without abnormality. No mastoid tenderness to palpation bilaterally.  Neck: Normal range of motion.  Cardiovascular: Normal rate, regular rhythm and intact distal pulses.   Pulmonary/Chest: Effort normal and breath sounds normal.  Abdominal: Soft. There is no tenderness.  Musculoskeletal: Normal range of motion.  Neurological: She is alert and oriented to person, place, and time.  Skin: She is not diaphoretic.  Psychiatric: She has a normal mood and affect.  Nursing note and vitals reviewed.    ED Treatments / Results  Labs (all labs ordered are listed, but only abnormal results are displayed) Labs Reviewed  CBG MONITORING, ED - Abnormal; Notable for the following:       Result Value   Glucose-Capillary 133 (*)    All other components  within normal limits    EKG  EKG Interpretation None       Radiology No results found.  Procedures Procedures (including critical care time)  Medications Ordered in ED Medications  amoxicillin (AMOXIL) capsule 1,000 mg (1,000 mg Oral Given 06/17/17 1857)  docusate (COLACE) 50 MG/5ML liquid 100 mg (100 mg Oral Given 06/17/17 1857)     Initial Impression / Assessment and Plan / ED Course  I have reviewed the triage vital signs and the nursing notes.  Pertinent labs & imaging results that were available during my care of the patient were reviewed by me and considered in my medical decision making (see chart for details).     Vitals:   06/17/17 1757 06/17/17 2006  BP: (!) 182/49 (!) 162/61  Pulse: 68 60  Resp: 16 16  Temp: 98.3 F (36.8 C) 98.1 F (36.7 C)  TempSrc: Oral Oral  SpO2: 100% 100%    Medications  amoxicillin (AMOXIL) capsule 1,000 mg (1,000 mg Oral Given 06/17/17 1857)  docusate (COLACE) 50 MG/5ML liquid 100 mg (100 mg Oral Given 06/17/17 5409)    [name] is 56 y.o. female presenting with Otitis media and cerumen impaction. Nursing could not clear the cerumen impaction. I will give her referral to urgent care center. No signs of systemic infection or mastoiditis with the ear infection. Advised patient to follow for blood pressure recheck with Dr. Doylene Canard.   Evaluation does not show pathology that would require ongoing emergent intervention or inpatient treatment. Pt is hemodynamically stable and mentating appropriately. Discussed findings and plan with patient/guardian, who agrees with care plan. All questions answered. Return precautions discussed and outpatient follow up given.     Final Clinical Impressions(s) / ED Diagnoses   Final diagnoses:  Acute suppurative otitis media of right ear without spontaneous rupture of tympanic membrane, recurrence not specified  Impacted cerumen of left ear    New Prescriptions Discharge Medication List as of  06/17/2017  7:50 PM    START taking these medications   Details  amoxicillin (AMOXIL) 500 MG capsule Take 2 capsules (1,000 mg total) by mouth 2 (two) times daily., Starting Fri 06/17/2017, Print         Nazario Russom, [place], PA-C 06/17/17 2101    Gareth Morgan, MD 06/21/17 1316

## 2017-06-17 NOTE — Discharge Instructions (Addendum)
Use Debrox once a month for 3 days to soften ceruman and prevent future impactions. NEVER use a Q tip in the ear canal  Please follow with your primary care doctor in the next 2 days for a check-up. They must obtain records for further management.   Do not hesitate to return to the Emergency Department for any new, worsening or concerning symptoms.

## 2017-06-17 NOTE — ED Triage Notes (Signed)
Per EMS pt complaint of bilateral ear pain; worse to left onset last night.

## 2017-06-17 NOTE — ED Notes (Signed)
Patient is A & O x4.  Family member understood discharge instructions.

## 2017-07-07 ENCOUNTER — Inpatient Hospital Stay (HOSPITAL_COMMUNITY)
Admission: EM | Admit: 2017-07-07 | Discharge: 2017-07-09 | DRG: 638 | Disposition: A | Discharge status: Home / Self Care | Payer: Self-pay | Attending: Cardiovascular Disease | Admitting: Cardiovascular Disease

## 2017-07-07 ENCOUNTER — Encounter (HOSPITAL_COMMUNITY): Payer: Self-pay | Admitting: Emergency Medicine

## 2017-07-07 DIAGNOSIS — N289 Disorder of kidney and ureter, unspecified: Secondary | ICD-10-CM

## 2017-07-07 DIAGNOSIS — Z7982 Long term (current) use of aspirin: Secondary | ICD-10-CM

## 2017-07-07 DIAGNOSIS — E2740 Unspecified adrenocortical insufficiency: Secondary | ICD-10-CM | POA: Diagnosis present

## 2017-07-07 DIAGNOSIS — E162 Hypoglycemia, unspecified: Secondary | ICD-10-CM

## 2017-07-07 DIAGNOSIS — E039 Hypothyroidism, unspecified: Secondary | ICD-10-CM | POA: Diagnosis present

## 2017-07-07 DIAGNOSIS — I129 Hypertensive chronic kidney disease with stage 1 through stage 4 chronic kidney disease, or unspecified chronic kidney disease: Secondary | ICD-10-CM | POA: Diagnosis present

## 2017-07-07 DIAGNOSIS — E440 Moderate protein-calorie malnutrition: Secondary | ICD-10-CM | POA: Diagnosis present

## 2017-07-07 DIAGNOSIS — N189 Chronic kidney disease, unspecified: Secondary | ICD-10-CM

## 2017-07-07 DIAGNOSIS — E11649 Type 2 diabetes mellitus with hypoglycemia without coma: Principal | ICD-10-CM | POA: Diagnosis present

## 2017-07-07 DIAGNOSIS — Z79899 Other long term (current) drug therapy: Secondary | ICD-10-CM

## 2017-07-07 DIAGNOSIS — N183 Chronic kidney disease, stage 3 (moderate): Secondary | ICD-10-CM | POA: Diagnosis present

## 2017-07-07 DIAGNOSIS — Z885 Allergy status to narcotic agent status: Secondary | ICD-10-CM

## 2017-07-07 DIAGNOSIS — D631 Anemia in chronic kidney disease: Secondary | ICD-10-CM | POA: Diagnosis present

## 2017-07-07 DIAGNOSIS — E1122 Type 2 diabetes mellitus with diabetic chronic kidney disease: Secondary | ICD-10-CM | POA: Diagnosis present

## 2017-07-07 DIAGNOSIS — R29898 Other symptoms and signs involving the musculoskeletal system: Secondary | ICD-10-CM

## 2017-07-07 DIAGNOSIS — E875 Hyperkalemia: Secondary | ICD-10-CM | POA: Diagnosis present

## 2017-07-07 DIAGNOSIS — Z7984 Long term (current) use of oral hypoglycemic drugs: Secondary | ICD-10-CM

## 2017-07-07 LAB — CBG MONITORING, ED
Glucose-Capillary: 139 mg/dL — ABNORMAL HIGH (ref 65–99)
Glucose-Capillary: 187 mg/dL — ABNORMAL HIGH (ref 65–99)

## 2017-07-07 NOTE — ED Notes (Signed)
Bed: TX52 Expected date:  Expected time:  Means of arrival:  Comments: 56yo F  Hypoglycemia

## 2017-07-07 NOTE — ED Notes (Signed)
Pt. CBG 187,, RN,Emily made aware.

## 2017-07-07 NOTE — ED Triage Notes (Signed)
Patient BIB [facility] for Hypocalcemia. Initial CBG on EMS arrival was 36. EMS reports pt was confused, non verbal, unable to follow commands, and diaphoretic. EMS administered 1 amp D50, CBG increased to 310. EMS also gave 200cc NS. Pt hypertensive. Pt now alert and responsive. Pt able to eat 3/4 of a bagel and drank some juice in rout to ED. Family reports pt was admitted for hypotension in may and thought to have a kidney issue but has not been able to get a follow up apt with specialist.

## 2017-07-08 ENCOUNTER — Encounter (HOSPITAL_COMMUNITY): Payer: Self-pay | Admitting: *Deleted

## 2017-07-08 ENCOUNTER — Observation Stay (HOSPITAL_COMMUNITY): Payer: Self-pay

## 2017-07-08 DIAGNOSIS — E162 Hypoglycemia, unspecified: Secondary | ICD-10-CM | POA: Diagnosis present

## 2017-07-08 LAB — COMPREHENSIVE METABOLIC PANEL
ALBUMIN: 3.5 g/dL (ref 3.5–5.0)
ALT: 39 U/L (ref 14–54)
AST: 37 U/L (ref 15–41)
Alkaline Phosphatase: 164 U/L — ABNORMAL HIGH (ref 38–126)
Anion gap: 7 (ref 5–15)
BUN: 31 mg/dL — ABNORMAL HIGH (ref 6–20)
CHLORIDE: 107 mmol/L (ref 101–111)
CO2: 21 mmol/L — AB (ref 22–32)
CREATININE: 1.77 mg/dL — AB (ref 0.44–1.00)
Calcium: 8.8 mg/dL — ABNORMAL LOW (ref 8.9–10.3)
GFR calc non Af Amer: 31 mL/min — ABNORMAL LOW (ref >60)
GFR, EST AFRICAN AMERICAN: 36 mL/min — AB (ref >60)
Glucose, Bld: 87 mg/dL (ref 65–99)
POTASSIUM: 5.3 mmol/L — AB (ref 3.5–5.1)
Sodium: 135 mmol/L (ref 135–145)
Total Bilirubin: 0.4 mg/dL (ref 0.3–1.2)
Total Protein: 7.1 g/dL (ref 6.5–8.1)

## 2017-07-08 LAB — CBC
HCT: 27.7 % — ABNORMAL LOW (ref 36.0–46.0)
Hemoglobin: 9.3 g/dL — ABNORMAL LOW (ref 12.0–15.0)
MCH: 28.6 pg (ref 26.0–34.0)
MCHC: 33.6 g/dL (ref 30.0–36.0)
MCV: 85.2 fL (ref 78.0–100.0)
Platelets: 137 10*3/uL — ABNORMAL LOW (ref 150–400)
RBC: 3.25 MIL/uL — ABNORMAL LOW (ref 3.87–5.11)
RDW: 13.2 % (ref 11.5–15.5)
WBC: 5.1 10*3/uL (ref 4.0–10.5)

## 2017-07-08 LAB — CBG MONITORING, ED
GLUCOSE-CAPILLARY: 68 mg/dL (ref 65–99)
GLUCOSE-CAPILLARY: 73 mg/dL (ref 65–99)
GLUCOSE-CAPILLARY: 100 mg/dL — AB (ref 65–99)
GLUCOSE-CAPILLARY: 109 mg/dL — AB (ref 65–99)
Glucose-Capillary: 103 mg/dL — ABNORMAL HIGH (ref 65–99)
Glucose-Capillary: 116 mg/dL — ABNORMAL HIGH (ref 65–99)
Glucose-Capillary: 118 mg/dL — ABNORMAL HIGH (ref 65–99)

## 2017-07-08 LAB — BASIC METABOLIC PANEL
ANION GAP: 6 (ref 5–15)
BUN: 29 mg/dL — ABNORMAL HIGH (ref 6–20)
CALCIUM: 9 mg/dL (ref 8.9–10.3)
CO2: 22 mmol/L (ref 22–32)
Chloride: 109 mmol/L (ref 101–111)
Creatinine, Ser: 1.67 mg/dL — ABNORMAL HIGH (ref 0.44–1.00)
GFR calc Af Amer: 38 mL/min — ABNORMAL LOW (ref >60)
GFR calc non Af Amer: 33 mL/min — ABNORMAL LOW (ref >60)
GLUCOSE: 85 mg/dL (ref 65–99)
Potassium: 5.6 mmol/L — ABNORMAL HIGH (ref 3.5–5.1)
Sodium: 137 mmol/L (ref 135–145)

## 2017-07-08 LAB — CBC WITH DIFFERENTIAL/PLATELET
BASOS PCT: 1 %
Basophils Absolute: 0 10*3/uL (ref 0.0–0.1)
Eosinophils Absolute: 0.5 10*3/uL (ref 0.0–0.7)
Eosinophils Relative: 9 %
HEMATOCRIT: 22.9 % — AB (ref 36.0–46.0)
HEMOGLOBIN: 7.7 g/dL — AB (ref 12.0–15.0)
LYMPHS PCT: 41 %
Lymphs Abs: 2 10*3/uL (ref 0.7–4.0)
MCH: 28.7 pg (ref 26.0–34.0)
MCHC: 33.6 g/dL (ref 30.0–36.0)
MCV: 85.4 fL (ref 78.0–100.0)
MONOS PCT: 5 %
Monocytes Absolute: 0.3 10*3/uL (ref 0.1–1.0)
NEUTROS ABS: 2.1 10*3/uL (ref 1.7–7.7)
NEUTROS PCT: 44 %
Platelets: 141 10*3/uL — ABNORMAL LOW (ref 150–400)
RBC: 2.68 MIL/uL — ABNORMAL LOW (ref 3.87–5.11)
RDW: 13.4 % (ref 11.5–15.5)
WBC: 4.8 10*3/uL (ref 4.0–10.5)

## 2017-07-08 LAB — GLUCOSE, CAPILLARY
GLUCOSE-CAPILLARY: 128 mg/dL — AB (ref 65–99)
Glucose-Capillary: 213 mg/dL — ABNORMAL HIGH (ref 65–99)
Glucose-Capillary: 213 mg/dL — ABNORMAL HIGH (ref 65–99)
Glucose-Capillary: 72 mg/dL (ref 65–99)

## 2017-07-08 LAB — ABO/RH: ABO/RH(D): A POS

## 2017-07-08 LAB — PREPARE RBC (CROSSMATCH)

## 2017-07-08 MED ORDER — ADULT MULTIVITAMIN W/MINERALS CH
1.0000 | ORAL_TABLET | Freq: Every day | ORAL | Status: DC
  Administered 2017-07-08 – 2017-07-09 (×2): 1 via ORAL
  Filled 2017-07-08 (×2): qty 1

## 2017-07-08 MED ORDER — PANTOPRAZOLE SODIUM 40 MG PO TBEC
40.0000 mg | DELAYED_RELEASE_TABLET | Freq: Every day | ORAL | Status: DC
  Administered 2017-07-08 – 2017-07-09 (×2): 40 mg via ORAL
  Filled 2017-07-08 (×2): qty 1

## 2017-07-08 MED ORDER — AMLODIPINE BESYLATE 5 MG PO TABS
5.0000 mg | ORAL_TABLET | Freq: Every day | ORAL | Status: DC
  Administered 2017-07-08 – 2017-07-09 (×2): 5 mg via ORAL
  Filled 2017-07-08 (×2): qty 1

## 2017-07-08 MED ORDER — LEVOTHYROXINE SODIUM 125 MCG PO TABS
125.0000 ug | ORAL_TABLET | Freq: Every day | ORAL | Status: DC
  Administered 2017-07-08 – 2017-07-09 (×2): 125 ug via ORAL
  Filled 2017-07-08 (×2): qty 1

## 2017-07-08 MED ORDER — AMLODIPINE BESYLATE 5 MG PO TABS
5.0000 mg | ORAL_TABLET | Freq: Once | ORAL | Status: AC
  Administered 2017-07-08: 5 mg via ORAL
  Filled 2017-07-08: qty 1

## 2017-07-08 MED ORDER — SODIUM CHLORIDE 0.9 % IV SOLN
INTRAVENOUS | Status: DC
  Administered 2017-07-08: 20:00:00 via INTRAVENOUS

## 2017-07-08 MED ORDER — HYDROCORTISONE NA SUCCINATE PF 100 MG IJ SOLR
100.0000 mg | Freq: Once | INTRAMUSCULAR | Status: AC
  Administered 2017-07-08: 100 mg via INTRAVENOUS
  Filled 2017-07-08: qty 2

## 2017-07-08 MED ORDER — SODIUM CHLORIDE 0.9 % IV BOLUS (SEPSIS)
1000.0000 mL | Freq: Once | INTRAVENOUS | Status: AC
  Administered 2017-07-08: 1000 mL via INTRAVENOUS

## 2017-07-08 MED ORDER — PNEUMOCOCCAL VAC POLYVALENT 25 MCG/0.5ML IJ INJ
0.5000 mL | INJECTION | INTRAMUSCULAR | Status: AC
  Administered 2017-07-09: 0.5 mL via INTRAMUSCULAR
  Filled 2017-07-08: qty 0.5

## 2017-07-08 MED ORDER — SODIUM POLYSTYRENE SULFONATE 15 GM/60ML PO SUSP
30.0000 g | Freq: Once | ORAL | Status: AC
  Administered 2017-07-08: 30 g via ORAL
  Filled 2017-07-08: qty 120

## 2017-07-08 MED ORDER — SODIUM CHLORIDE 0.9 % IV SOLN: Freq: Once | INTRAVENOUS | Status: DC

## 2017-07-08 MED ORDER — HYDROCORTISONE 5 MG PO TABS
5.0000 mg | ORAL_TABLET | Freq: Two times a day (BID) | ORAL | Status: DC
  Administered 2017-07-08 – 2017-07-09 (×3): 5 mg via ORAL
  Filled 2017-07-08 (×4): qty 1

## 2017-07-08 MED ORDER — INSULIN ASPART 100 UNIT/ML ~~LOC~~ SOLN
0.0000 [IU] | Freq: Three times a day (TID) | SUBCUTANEOUS | Status: DC
Start: 2017-07-08 — End: 2017-07-09
  Administered 2017-07-08 (×2): 3 [IU] via SUBCUTANEOUS
  Administered 2017-07-09: 2 [IU] via SUBCUTANEOUS
  Administered 2017-07-09: 1 [IU] via SUBCUTANEOUS

## 2017-07-08 NOTE — ED Provider Notes (Signed)
[facility] DEPT Provider Note   CSN: [id number] Arrival date & time: 07/07/17  2227     History   Chief Complaint Chief Complaint  Patient presents with  . Hypoglycemia    HPI [name] is a 56 y.o. female with a hx of Non-insulin-dependent diabetes, hypertension, thyroid disease, chronic kidney disease presents to the Emergency Department complaining of acute hypoglycemia around 8pm.  Patient was given D50 and afterwards ate an entire bagel and some fruit. Pt is taking glipizide 2.5 BID.  Pt reports morning CBG readings of 40-50 several times in the last few weeks. Pt with increasing renal failure and after hypoglycemic episode in May pt Glipizde dose was lowered.  Pt reports she ate a snack around 4pm and then CBG was 36 at 8pm.  She did not take the glipizide this evening.  Pt reports she has eaten breakfast, lunch and snack today. Patient denies fevers or chills, nausea or vomiting. She denies infectious symptoms including dysuria, hematuria.  Patient reports she is being followed by her primary care for her kidney disease but does not know her baseline creatinine. No known relieving factors.     The history is provided by the patient and medical records. No language interpreter was used.    Past Medical History:  Diagnosis Date  . Diabetes mellitus without complication ([place])   . Hypertension   . Thyroid disease     Patient Active Problem List   Diagnosis Date Noted  . Hypoglycemia associated with diabetes ([place]) 05/10/2017  . Malnutrition of moderate degree 05/10/2017    Past Surgical History:  Procedure Laterality Date  . CESAREAN SECTION      OB History    No data available       Home Medications    Prior to Admission medications   Medication Sig Start Date End Date Taking? Authorizing Provider  amLODipine (NORVASC) 5 MG tablet Take 5 mg by mouth daily.   Yes [provider]  aspirin 81 MG chewable tablet Chew 81 mg by mouth daily.   Yes  [provider]  glipiZIDE (GLUCOTROL) 5 MG tablet Take 0.5 tablets (2.5 mg total) by mouth daily before breakfast. 05/12/17  Yes Dixie Dials, MD  hydrocortisone (CORTEF) 5 MG tablet Take 5 mg by mouth 2 (two) times daily.   Yes [provider]  levothyroxine (SYNTHROID, LEVOTHROID) 125 MCG tablet Take 125 mcg by mouth daily before breakfast.   Yes [provider]  Multiple Vitamin (MULTIVITAMIN WITH MINERALS) TABS tablet Take 1 tablet by mouth daily.   Yes [provider]  pantoprazole (PROTONIX) 40 MG tablet Take 40 mg by mouth daily.   Yes [provider]  amoxicillin (AMOXIL) 500 MG capsule Take 2 capsules (1,000 mg total) by mouth 2 (two) times daily. Patient not taking: Reported on 07/07/2017 06/17/17   [place], Elmyra Ricks, PA-C    Family History Family History  Problem Relation Age of Onset  . Diabetes Mother   . Hyperlipidemia Father   . Hypertension Father     Social History Social History  Substance Use Topics  . Smoking status: Never Smoker  . Smokeless tobacco: Never Used  . Alcohol use No     Allergies   Percocet [oxycodone-acetaminophen]   Review of Systems Review of Systems  Constitutional: Negative for appetite change, diaphoresis, fatigue, fever and unexpected weight change.  HENT: Negative for mouth sores.   Eyes: Negative for visual disturbance.  Respiratory: Negative for cough, chest tightness, shortness of breath and  wheezing.   Cardiovascular: Negative for chest pain.  Gastrointestinal: Negative for abdominal pain, constipation, diarrhea, nausea and vomiting.  Endocrine: Negative for polydipsia, polyphagia and polyuria.  Genitourinary: Negative for dysuria, frequency, hematuria and urgency.  Musculoskeletal: Negative for back pain and neck stiffness.  Skin: Negative for rash.  Allergic/Immunologic: Negative for immunocompromised state.  Neurological: Negative for syncope, light-headedness and headaches.    Hematological: Does not bruise/bleed easily.  Psychiatric/Behavioral: Negative for sleep disturbance. The patient is not nervous/anxious.   All other systems reviewed and are negative.    Physical Exam Updated Vital Signs BP (!) 162/83 (BP Location: Left Arm)   Pulse 85   Temp (!) 97.5 F (36.4 C) (Oral)   Resp 16   SpO2 100%   Physical Exam  Constitutional: She appears well-developed and well-nourished. No distress.  Awake, alert, nontoxic appearance  HENT:  Head: Normocephalic and atraumatic.  Mouth/Throat: Oropharynx is clear and moist. No oropharyngeal exudate.  Moist mucous membranes  Eyes: Conjunctivae are normal. No scleral icterus.  Neck: Normal range of motion. Neck supple.  Cardiovascular: Normal rate, regular rhythm and intact distal pulses.   Pulmonary/Chest: Effort normal and breath sounds normal. No respiratory distress. She has no wheezes.  Equal chest expansion  Abdominal: Soft. Bowel sounds are normal. She exhibits no mass. There is no tenderness. There is no rebound and no guarding.  Musculoskeletal: Normal range of motion. She exhibits no edema.  Neurological: She is alert.  Speech is clear and goal oriented Moves extremities without ataxia  Skin: Skin is warm and dry. She is not diaphoretic.  Psychiatric: She has a normal mood and affect.  Nursing note and vitals reviewed.    ED Treatments / Results  Labs (all labs ordered are listed, but only abnormal results are displayed) Labs Reviewed  CBC WITH DIFFERENTIAL/PLATELET - Abnormal; Notable for the following:       Result Value   RBC 2.68 (*)    Hemoglobin 7.7 (*)    HCT 22.9 (*)    Platelets 141 (*)    All other components within normal limits  COMPREHENSIVE METABOLIC PANEL - Abnormal; Notable for the following:    Potassium 5.3 (*)    CO2 21 (*)    BUN 31 (*)    Creatinine, Ser 1.77 (*)    Calcium 8.8 (*)    Alkaline Phosphatase 164 (*)    GFR calc non Af Amer 31 (*)    GFR calc Af Amer  36 (*)    All other components within normal limits  CBG MONITORING, ED - Abnormal; Notable for the following:    Glucose-Capillary 187 (*)    All other components within normal limits  CBG MONITORING, ED - Abnormal; Notable for the following:    Glucose-Capillary 139 (*)    All other components within normal limits  CBG MONITORING, ED - Abnormal; Notable for the following:    Glucose-Capillary 116 (*)    All other components within normal limits  CBG MONITORING, ED - Abnormal; Notable for the following:    Glucose-Capillary 103 (*)    All other components within normal limits  CBG MONITORING, ED  CBG MONITORING, ED      EKG Interpretation  Date/Time:  Friday July 08 2017 02:39:35 EDT Ventricular Rate:  63 PR Interval:    QRS Duration: 99 QT Interval:  454 QTC Calculation: 465 R Axis:   25 Text Interpretation:  Sinus rhythm No significant change since last tracing Confirmed by Ripley Fraise [phone])  on 07/08/2017 2:50:06 AM       Procedures Procedures (including critical care time)  Medications Ordered in ED Medications  sodium chloride 0.9 % bolus 1,000 mL (1,000 mLs Intravenous New Bag/Given 07/08/17 0245)  amLODipine (NORVASC) tablet 5 mg (5 mg Oral Given 07/08/17 0245)  hydrocortisone sodium succinate (SOLU-CORTEF) 100 MG injection 100 mg (100 mg Intravenous Given 07/08/17 0245)     Initial Impression / Assessment and Plan / ED Course  I have reviewed the triage vital signs and the nursing notes.  Pertinent labs & imaging results that were available during my care of the patient were reviewed by me and considered in my medical decision making (see chart for details).     Patient is on a long-acting hyperglycemic with known kidney disease. Today she has increasing anemia at 7.7 and hyperkalemia 5.3. Creatinine is 1.77, unchanged from previous measurement here.  Patient's initial glucose 187 upon arrival here in the emergency department however most recent check  showed it at 49. Patient was given additional food. She'll need admission for blood sugar monitoring throughout the night.  Final Clinical Impressions(s) / ED Diagnoses   Final diagnoses:  Hypoglycemia  Renal insufficiency  Hyperkalemia  Anemia in chronic kidney disease, unspecified CKD stage    New Prescriptions New Prescriptions   No medications on file     Agapito Games 07/08/17 0250    Ripley Fraise, MD 07/08/17 [phone]

## 2017-07-08 NOTE — Progress Notes (Signed)
Ref: Dixie Dials, MD   Subjective:  Feeling better. Elevated potassium. Hgb A1C pending.   Objective:  Vital Signs in the last 24 hours: Temp:  [97.5 F (36.4 C)-99.4 F (37.4 C)] 98.5 F (36.9 C) (07/20 1240) Pulse Rate:  [48-85] 70 (07/20 1240) Resp:  [13-20] 16 (07/20 1240) BP: (161-202)/(53-85) 188/75 (07/20 1240) SpO2:  [96 %-100 %] 100 % (07/20 1240) Weight:  [43 kg (94 lb 12.8 oz)] 43 kg (94 lb 12.8 oz) (07/20 5409)  Physical Exam: BP Readings from Last 1 Encounters:  07/08/17 (!) 188/75    Wt Readings from Last 1 Encounters:  07/08/17 43 kg (94 lb 12.8 oz)    Weight change:  Body mass index is 16.79 kg/m. HEENT: [place]/AT, Eyes-Brown, PERL, EOMI, Conjunctiva-Pale, Sclera-Non-icteric Neck: No JVD, No bruit, Trachea midline. Lungs:  Clear, Bilateral. Cardiac:  Regular rhythm, normal S1 and S2, no S3. II/VI systolic murmur. Abdomen:  Soft, non-tender. BS present. Extremities:  No edema present. No cyanosis. No clubbing. CNS: AxOx3, Cranial nerves grossly intact, moves all 4 extremities.  Skin: Warm and dry.   Intake/Output from previous day: 07/19 0701 - 07/20 0700 In: 1000 [IV Piggyback:1000] Out: -     Lab Results: BMET    Component Value Date/Time   NA 137 07/08/2017 1412   NA 135 07/08/2017 0115   NA 140 05/11/2017 0527   K 5.6 (H) 07/08/2017 1412   K 5.3 (H) 07/08/2017 0115   K 4.9 05/11/2017 0527   CL 109 07/08/2017 1412   CL 107 07/08/2017 0115   CL 115 (H) 05/11/2017 0527   CO2 22 07/08/2017 1412   CO2 21 (L) 07/08/2017 0115   CO2 20 (L) 05/11/2017 0527   GLUCOSE 85 07/08/2017 1412   GLUCOSE 87 07/08/2017 0115   GLUCOSE 125 (H) 05/11/2017 0527   BUN 29 (H) 07/08/2017 1412   BUN 31 (H) 07/08/2017 0115   BUN 26 (H) 05/11/2017 0527   CREATININE 1.67 (H) 07/08/2017 1412   CREATININE 1.77 (H) 07/08/2017 0115   CREATININE 1.77 (H) 05/11/2017 0527   CALCIUM 9.0 07/08/2017 1412   CALCIUM 8.8 (L) 07/08/2017 0115   CALCIUM 9.1 05/11/2017 0527   GFRNONAA 33 (L) 07/08/2017 1412   GFRNONAA 31 (L) 07/08/2017 0115   GFRNONAA 31 (L) 05/11/2017 0527   GFRAA 38 (L) 07/08/2017 1412   GFRAA 36 (L) 07/08/2017 0115   GFRAA 36 (L) 05/11/2017 0527   CBC    Component Value Date/Time   WBC 5.1 07/08/2017 1412   RBC 3.25 (L) 07/08/2017 1412   HGB 9.3 (L) 07/08/2017 1412   HCT 27.7 (L) 07/08/2017 1412   PLT 137 (L) 07/08/2017 1412   MCV 85.2 07/08/2017 1412   MCH 28.6 07/08/2017 1412   MCHC 33.6 07/08/2017 1412   RDW 13.2 07/08/2017 1412   LYMPHSABS 2.0 07/08/2017 0115   MONOABS 0.3 07/08/2017 0115   EOSABS 0.5 07/08/2017 0115   BASOSABS 0.0 07/08/2017 0115   HEPATIC Function Panel  Recent Labs  05/09/17 2305 07/08/17 0115  PROT 7.1 7.1   HEMOGLOBIN A1C No components found for: HGA1C,  MPG CARDIAC ENZYMES Lab Results  Component Value Date   CKTOTAL 147 06/12/2011   CKMB 3.2 06/12/2011   TROPONINI  05/13/2011    <0.30        Due to the release kinetics of cTnI, a negative result within the first hours of the onset of symptoms does not rule out myocardial infarction with certainty. If myocardial infarction is  still suspected, repeat the test at appropriate intervals. **Please note change in reference range.**   TROPONINI  05/12/2011    <0.30        Due to the release kinetics of cTnI, a negative result within the first hours of the onset of symptoms does not rule out myocardial infarction with certainty. If myocardial infarction is still suspected, repeat the test at appropriate intervals. **Please note change in reference range.**   TROPONINI  05/12/2011    <0.30        Due to the release kinetics of cTnI, a negative result within the first hours of the onset of symptoms does not rule out myocardial infarction with certainty. If myocardial infarction is still suspected, repeat the test at appropriate intervals. **Please note change in reference range.**   BNP No results for input(s): PROBNP in the last 8760  hours. TSH  Recent Labs  05/10/17 0506  TSH 3.193   CHOLESTEROL No results for input(s): CHOL in the last 8760 hours.  Scheduled Meds: . amLODipine  5 mg Oral Daily  . hydrocortisone  5 mg Oral BID  . insulin aspart  0-9 Units Subcutaneous TID WC  . levothyroxine  125 mcg Oral QAC breakfast  . multivitamin with minerals  1 tablet Oral Daily  . pantoprazole  40 mg Oral Daily  . [START ON 07/09/2017] pneumococcal 23 valent vaccine  0.5 mL Intramuscular Tomorrow-1000  . sodium polystyrene  30 g Oral Once   Continuous Infusions: . sodium chloride     PRN Meds:.  Assessment/Plan: DM, II CKD, II Hypothyroidism Chronic adrenal insufficiency Anemia of chronic disease Hyperkalemia  Kayexalate PO. IV fluids. Renal diet. Patient made aware of no more orange juice, banana or potaoes and other potassium rich food.    LOS: 0 days    Dixie Dials  MD  07/08/2017, 5:56 PM

## 2017-07-08 NOTE — Progress Notes (Signed)
Initial Nutrition Assessment  DOCUMENTATION CODES:   Underweight, Non-severe (moderate) malnutrition in context of chronic illness  INTERVENTION:   Provide Magic cup BID with meals, each supplement provides 290 kcal and 9 grams of protein Provide daily snacks in between meals  RD will continue to monitor  NUTRITION DIAGNOSIS:   Malnutrition (Moderate) related to chronic illness, poor appetite (DM) as evidenced by percent weight loss, moderate depletions of muscle mass, moderate depletion of body fat.  GOAL:   Patient will meet greater than or equal to 90% of their needs  MONITOR:   PO intake, Labs, Weight trends, I & O's  REASON FOR ASSESSMENT:    (Low BMI)    ASSESSMENT:   56 year old female with history of DM, II, hypertension, hypothyroidism, adrenal insufficiency, anemia of chronic disease has hypoglycemic episodes in spite of significant decrease in hypoglycemic agent 2 months ago. Patient admits to decrease oral intake.  Patient continues to struggle with appetite. Pt with history of fluctuating appetite and intakes per chart review. Pt states she has always been thin. Pt does not like Ensure/Boost drinks. Will order Magic cups and daily snacks.  Per chart review, pt has lost 15 lb since 5/23 (14% wt loss x 2 months, significant for time frame). Nutrition-Focused physical exam completed. Findings are moderate fat depletion, moderate muscle depletion, and no edema.   Medications: Multivitamin with minerals daily, Protonix tablet daily Labs reviewed: CBGs: 213 Elevated K  Diet Order:  Diet heart healthy/carb modified Room service appropriate? Yes; Fluid consistency: Thin  Skin:  Reviewed, no issues  Last BM:  7/20  Height:   Ht Readings from Last 1 Encounters:  07/08/17 5\' 3"  (1.6 m)    Weight:   Wt Readings from Last 1 Encounters:  07/08/17 94 lb 12.8 oz (43 kg)    Ideal Body Weight:  52.3 kg  BMI:  Body mass index is 16.79 kg/m.  Estimated  Nutritional Needs:   Kcal:  1200-1400  Protein:  55-65g  Fluid:  1.5L/day  EDUCATION NEEDS:   No education needs identified at this time  Clayton Bibles, MS, RD, LDN Pager: [phone] After Hours Pager: [phone]

## 2017-07-08 NOTE — ED Notes (Signed)
Patient eating orange juice and peanut butter crackers

## 2017-07-08 NOTE — H&P (Signed)
Referring Physician:  Marit Goodwill is an 56 y.o. female.                       Chief Complaint: Hypoglycemia   HPI: 56 year old female with history of DM, II, hypertension, hypothyroidism, adrenal insufficiency, anemia of chronic disease has hypoglycemic episodes in spite of significant decrease in hypoglycemic agent 2 months ago. Patient admits to decrease oral intake.  Past Medical History:  Diagnosis Date  . Diabetes mellitus without complication ([place])   . Hypertension   . Thyroid disease       Past Surgical History:  Procedure Laterality Date  . CESAREAN SECTION      Family History  Problem Relation Age of Onset  . Diabetes Mother   . Hyperlipidemia Father   . Hypertension Father    Social History:  reports that she has never smoked. She has never used smokeless tobacco. She reports that she does not drink alcohol or use drugs.  Allergies:  Allergies  Allergen Reactions  . Percocet [Oxycodone-Acetaminophen] Rash     (Not in a hospital admission)  Results for orders placed or performed during the hospital encounter of 07/07/17 (from the past 48 hour(s))  CBG monitoring, ED     Status: Abnormal   Collection Time: 07/07/17 10:55 PM  Result Value Ref Range   Glucose-Capillary 187 (H) 65 - 99 mg/dL  CBG monitoring, ED     Status: Abnormal   Collection Time: 07/07/17 11:42 PM  Result Value Ref Range   Glucose-Capillary 139 (H) 65 - 99 mg/dL  CBG monitoring, ED     Status: Abnormal   Collection Time: 07/08/17 12:31 AM  Result Value Ref Range   Glucose-Capillary 116 (H) 65 - 99 mg/dL  CBC with Differential     Status: Abnormal   Collection Time: 07/08/17  1:15 AM  Result Value Ref Range   WBC 4.8 4.0 - 10.5 K/uL   RBC 2.68 (L) 3.87 - 5.11 MIL/uL   Hemoglobin 7.7 (L) 12.0 - 15.0 g/dL   HCT 22.9 (L) 36.0 - 46.0 %   MCV 85.4 78.0 - 100.0 fL   MCH 28.7 26.0 - 34.0 pg   MCHC 33.6 30.0 - 36.0 g/dL   RDW 13.4 11.5 - 15.5 %   Platelets 141 (L) 150 - 400 K/uL   Neutrophils Relative % 44 %   Neutro Abs 2.1 1.7 - 7.7 K/uL   Lymphocytes Relative 41 %   Lymphs Abs 2.0 0.7 - 4.0 K/uL   Monocytes Relative 5 %   Monocytes Absolute 0.3 0.1 - 1.0 K/uL   Eosinophils Relative 9 %   Eosinophils Absolute 0.5 0.0 - 0.7 K/uL   Basophils Relative 1 %   Basophils Absolute 0.0 0.0 - 0.1 K/uL  Comprehensive metabolic panel     Status: Abnormal   Collection Time: 07/08/17  1:15 AM  Result Value Ref Range   Sodium 135 135 - 145 mmol/L   Potassium 5.3 (H) 3.5 - 5.1 mmol/L   Chloride 107 101 - 111 mmol/L   CO2 21 (L) 22 - 32 mmol/L   Glucose, Bld 87 65 - 99 mg/dL   BUN 31 (H) 6 - 20 mg/dL   Creatinine, Ser 1.77 (H) 0.44 - 1.00 mg/dL   Calcium 8.8 (L) 8.9 - 10.3 mg/dL   Total Protein 7.1 6.5 - 8.1 g/dL   Albumin 3.5 3.5 - 5.0 g/dL   AST 37 15 - 41 U/L   ALT  39 14 - 54 U/L   Alkaline Phosphatase 164 (H) 38 - 126 U/L   Total Bilirubin 0.4 0.3 - 1.2 mg/dL   GFR calc non Af Amer 31 (L) >60 mL/min   GFR calc Af Amer 36 (L) >60 mL/min    Comment: (NOTE) The eGFR has been calculated using the CKD EPI equation. This calculation has not been validated in all clinical situations. eGFR's persistently <60 mL/min signify possible Chronic Kidney Disease.    Anion gap 7 5 - 15  CBG monitoring, ED     Status: None   Collection Time: 07/08/17  1:46 AM  Result Value Ref Range   Glucose-Capillary 68 65 - 99 mg/dL  CBG monitoring, ED     Status: None   Collection Time: 07/08/17  2:17 AM  Result Value Ref Range   Glucose-Capillary 73 65 - 99 mg/dL  CBG monitoring, ED     Status: Abnormal   Collection Time: 07/08/17  2:46 AM  Result Value Ref Range   Glucose-Capillary 103 (H) 65 - 99 mg/dL   Comment 1 Notify RN   CBG monitoring, ED     Status: Abnormal   Collection Time: 07/08/17  3:15 AM  Result Value Ref Range   Glucose-Capillary 100 (H) 65 - 99 mg/dL   No results found.  Review Of Systems Constitutional: No fever, chills, positive chronic weight loss. Eyes:  No vision change, wears glasses. No discharge or pain. Ears: No hearing loss, No tinnitus. Respiratory: No asthma, COPD, pneumonias. No shortness of breath. No hemoptysis. Cardiovascular: No chest pain, palpitation, leg edema. Gastrointestinal: No nausea, vomiting, diarrhea, constipation. No GI bleed. No hepatitis. Genitourinary: No dysuria, hematuria, kidney stone. No incontinance. Neurological: No headache, stroke, seizures.  Psychiatry: No psych facility admission for anxiety, depression, suicide. No detox. Skin: No rash. Musculoskeletal: No joint pain, fibromyalgia. No neck pain, back pain. Lymphadenopathy: No lymphadenopathy. Hematology: Positive anemia, no easy bruising.   Blood pressure (!) 191/53, pulse 63, temperature (!) 97.5 F (36.4 C), temperature source Oral, resp. rate 15, SpO2 100 %. There is no height or weight on file to calculate BMI. General appearance: alert, cooperative, appears stated age and no distress Head: Normocephalic, atraumatic. Eyes: Brown eyes, pale conjunctiva, corneas clear. PERRL, EOM's intact. Neck: No adenopathy, no carotid bruit, no JVD, supple, symmetrical, trachea midline and thyroid not enlarged. Resp: Clear to auscultation bilaterally. Cardio: Regular rate and rhythm, S1, S2 normal, II/VI systolic murmur, no click, rub or gallop GI: Soft, non-tender; bowel sounds normal; no organomegaly. Extremities: No edema, cyanosis or clubbing. Skin: Warm and dry.  Neurologic: Alert and oriented X 3, normal strength. Normal coordination and gait.  Assessment/Plan Hypoglycemia in type 2 DM CKD, II Hypothyroidism Chronic adrenal insufficiency Anemia of chronic disease  Place in observation. Transfuse one unit of blood. Discontinue Glipizide.  Birdie Riddle, MD  07/08/2017, 3:49 AM

## 2017-07-08 NOTE — ED Notes (Signed)
EKG given to EDP,Wickline,MD., for review. 

## 2017-07-09 LAB — CBC
HCT: 26.7 % — ABNORMAL LOW (ref 36.0–46.0)
Hemoglobin: 9.1 g/dL — ABNORMAL LOW (ref 12.0–15.0)
MCH: 29.2 pg (ref 26.0–34.0)
MCHC: 34.1 g/dL (ref 30.0–36.0)
MCV: 85.6 fL (ref 78.0–100.0)
PLATELETS: 121 10*3/uL — AB (ref 150–400)
RBC: 3.12 MIL/uL — ABNORMAL LOW (ref 3.87–5.11)
RDW: 13.8 % (ref 11.5–15.5)
WBC: 6.8 10*3/uL (ref 4.0–10.5)

## 2017-07-09 LAB — BASIC METABOLIC PANEL
ANION GAP: 6 (ref 5–15)
BUN: 29 mg/dL — AB (ref 6–20)
CALCIUM: 8.7 mg/dL — AB (ref 8.9–10.3)
CO2: 21 mmol/L — ABNORMAL LOW (ref 22–32)
CREATININE: 1.61 mg/dL — AB (ref 0.44–1.00)
Chloride: 110 mmol/L (ref 101–111)
GFR calc Af Amer: 40 mL/min — ABNORMAL LOW (ref >60)
GFR, EST NON AFRICAN AMERICAN: 34 mL/min — AB (ref >60)
GLUCOSE: 131 mg/dL — AB (ref 65–99)
Potassium: 4.3 mmol/L (ref 3.5–5.1)
Sodium: 137 mmol/L (ref 135–145)

## 2017-07-09 LAB — BPAM RBC
Blood Product Expiration Date: 201808032359
ISSUE DATE / TIME: 201807200947
Unit Type and Rh: 6200

## 2017-07-09 LAB — TYPE AND SCREEN
ABO/RH(D): A POS
ANTIBODY SCREEN: NEGATIVE
UNIT DIVISION: 0

## 2017-07-09 LAB — HEMOGLOBIN A1C
HEMOGLOBIN A1C: 5.3 % (ref 4.8–5.6)
Mean Plasma Glucose: 105 mg/dL

## 2017-07-09 LAB — GLUCOSE, CAPILLARY
GLUCOSE-CAPILLARY: 122 mg/dL — AB (ref 65–99)
GLUCOSE-CAPILLARY: 188 mg/dL — AB (ref 65–99)

## 2017-07-09 NOTE — Progress Notes (Signed)
Patient discharged to home with family, discharge instructions reviewed with patient and family who verbalized understanding. No new RX for patient.

## 2017-07-09 NOTE — Discharge Summary (Signed)
Physician Discharge Summary  Patient ID: [name] MRN: [id number] DOB/AGE: Jan 12, 1961 56 y.o.  Admit date: 07/07/2017 Discharge date: 07/09/2017  Admission Diagnoses: Hypoglycemic attack CKD, II Hypothyroidism Chronic adrenal insufficiency Anemia of chronic disease  Discharge Diagnoses:  Principal problem: *Acute hypoglycemic attack * Active Problems:   Type 2 diabetes mellitus, diet-controlled   CKD 3   Hypothyroidism   Chronic adrenal insufficiency   Anemia of chronic disease   Moderate protein calorie malnutrition  Discharged Condition: fair  Hospital Course: 56 year old female with a history of diabetes mellitus type 2, hypertension, hypothyroidism, adrenal insufficiency, anemia of chronic disease presented with hypoglycemic episode in spite of significant decrease in hypoglycemic agents 2 months ago. Patient had recently decreased oral intake and had loss of weight. With the mild renal insufficiency her insulin requirement has dropped. She was given IV fluids and had dietary consult. She was discharged home in stable condition with and no hypoglycemic agents and she'll be followed by me in 1 week.  Consults: Registered dietitian  Significant Diagnostic Studies: labs: Hemoglobin A1c was very low at 5.3. CBC showed a hemoglobin of 7.7 improving to 9.3 post 1 unit of blood transfusion. BMET showed elevated BUN of 31 and creatinine of 1.77. Her blood group was a positive.  EKG was normal sinus rhythm.  Hip x-ray was unremarkable  Treatments: IV hydration.  Discharge Exam: Blood pressure (!) 164/52, pulse 64, temperature 98.7 F (37.1 C), temperature source Oral, resp. rate 18, height 5\' 3"  (1.6 m), weight 43 kg (94 lb 12.8 oz), SpO2 100 %. General appearance: alert, cooperative and appears stated age. Head: Normocephalic, atraumatic. Eyes: Brown eyes, pale pink conjunctiva, corneas clear. PERRL, EOM's intact.  Neck: No adenopathy, no carotid bruit, no JVD, supple,  symmetrical, trachea midline and thyroid not enlarged. Resp: Clear to auscultation bilaterally. Cardio: Regular rate and rhythm, S1, S2 normal, II/VI systolic murmur, no click, rub or gallop. GI: Soft, non-tender; bowel sounds normal; no organomegaly. Extremities: No edema, cyanosis or clubbing. Skin: Warm and dry.  Neurologic: Alert and oriented X 3, normal strength and tone. Normal coordination and slow gait.  Disposition: 01-Home or Self Care   Allergies as of 07/09/2017      Reactions   Percocet [oxycodone-acetaminophen] Rash      Medication List    STOP taking these medications   amoxicillin 500 MG capsule Commonly known as:  AMOXIL     TAKE these medications   amLODipine 5 MG tablet Commonly known as:  NORVASC Take 5 mg by mouth daily.   aspirin 81 MG chewable tablet Chew 81 mg by mouth daily.   hydrocortisone 5 MG tablet Commonly known as:  CORTEF Take 5 mg by mouth 2 (two) times daily.   levothyroxine 125 MCG tablet Commonly known as:  SYNTHROID, LEVOTHROID Take 125 mcg by mouth daily before breakfast.   multivitamin with minerals Tabs tablet Take 1 tablet by mouth daily.   pantoprazole 40 MG tablet Commonly known as:  PROTONIX Take 40 mg by mouth daily.      Follow-up Information    Dixie Dials, MD. Schedule an appointment as soon as possible for a visit in 1 week(s).   Specialty:  Cardiology Contact information: [place] [address] [phone]           Signed: Birdie Riddle 07/09/2017, 11:54 AM

## 2017-07-11 LAB — GLUCOSE, CAPILLARY: GLUCOSE-CAPILLARY: 128 mg/dL — AB (ref 65–99)

## 2017-12-05 ENCOUNTER — Other Ambulatory Visit: Payer: Self-pay

## 2017-12-05 ENCOUNTER — Encounter (HOSPITAL_COMMUNITY): Payer: Self-pay | Admitting: Emergency Medicine

## 2017-12-05 ENCOUNTER — Emergency Department (HOSPITAL_COMMUNITY)
Admission: EM | Admit: 2017-12-05 | Discharge: 2017-12-06 | Disposition: A | Discharge status: Home / Self Care | Payer: Self-pay | Attending: Emergency Medicine | Admitting: Emergency Medicine

## 2017-12-05 DIAGNOSIS — R109 Unspecified abdominal pain: Secondary | ICD-10-CM | POA: Insufficient documentation

## 2017-12-05 DIAGNOSIS — Z5321 Procedure and treatment not carried out due to patient leaving prior to being seen by health care provider: Secondary | ICD-10-CM | POA: Insufficient documentation

## 2017-12-05 LAB — COMPREHENSIVE METABOLIC PANEL
ALBUMIN: 3.7 g/dL (ref 3.5–5.0)
ALK PHOS: 147 U/L — AB (ref 38–126)
ALT: 45 U/L (ref 14–54)
ANION GAP: 5 (ref 5–15)
AST: 30 U/L (ref 15–41)
BUN: 41 mg/dL — AB (ref 6–20)
CO2: 25 mmol/L (ref 22–32)
Calcium: 8.8 mg/dL — ABNORMAL LOW (ref 8.9–10.3)
Chloride: 103 mmol/L (ref 101–111)
Creatinine, Ser: 2.87 mg/dL — ABNORMAL HIGH (ref 0.44–1.00)
GFR calc Af Amer: 20 mL/min — ABNORMAL LOW (ref >60)
GFR calc non Af Amer: 17 mL/min — ABNORMAL LOW (ref >60)
GLUCOSE: 224 mg/dL — AB (ref 65–99)
POTASSIUM: 4.3 mmol/L (ref 3.5–5.1)
SODIUM: 133 mmol/L — AB (ref 135–145)
Total Bilirubin: 1.5 mg/dL — ABNORMAL HIGH (ref 0.3–1.2)
Total Protein: 7.2 g/dL (ref 6.5–8.1)

## 2017-12-05 LAB — CBC
HEMATOCRIT: 26.5 % — AB (ref 36.0–46.0)
HEMOGLOBIN: 8.7 g/dL — AB (ref 12.0–15.0)
MCH: 28.8 pg (ref 26.0–34.0)
MCHC: 32.8 g/dL (ref 30.0–36.0)
MCV: 87.7 fL (ref 78.0–100.0)
Platelets: 124 10*3/uL — ABNORMAL LOW (ref 150–400)
RBC: 3.02 MIL/uL — ABNORMAL LOW (ref 3.87–5.11)
RDW: 14.7 % (ref 11.5–15.5)
WBC: 9.7 10*3/uL (ref 4.0–10.5)

## 2017-12-05 LAB — LIPASE, BLOOD: Lipase: 23 U/L (ref 11–51)

## 2017-12-05 LAB — CBG MONITORING, ED: Glucose-Capillary: 161 mg/dL — ABNORMAL HIGH (ref 65–99)

## 2017-12-05 NOTE — ED Triage Notes (Signed)
Pt complaint of abdominal pain onset yesterday; associated nausea. Denies vomiting or diarrhea.

## 2017-12-09 ENCOUNTER — Inpatient Hospital Stay (HOSPITAL_COMMUNITY)
Admission: AD | Admit: 2017-12-09 | Discharge: 2017-12-11 | DRG: 683 | Disposition: A | Discharge status: Home / Self Care | Payer: Self-pay | Source: Ambulatory Visit | Attending: Cardiovascular Disease | Admitting: Cardiovascular Disease

## 2017-12-09 ENCOUNTER — Inpatient Hospital Stay (HOSPITAL_COMMUNITY): Payer: Self-pay

## 2017-12-09 ENCOUNTER — Encounter (HOSPITAL_COMMUNITY): Payer: Self-pay | Admitting: General Practice

## 2017-12-09 DIAGNOSIS — R1031 Right lower quadrant pain: Secondary | ICD-10-CM | POA: Diagnosis present

## 2017-12-09 DIAGNOSIS — J209 Acute bronchitis, unspecified: Secondary | ICD-10-CM | POA: Diagnosis present

## 2017-12-09 DIAGNOSIS — E860 Dehydration: Secondary | ICD-10-CM | POA: Diagnosis present

## 2017-12-09 DIAGNOSIS — N183 Chronic kidney disease, stage 3 (moderate): Secondary | ICD-10-CM | POA: Diagnosis present

## 2017-12-09 DIAGNOSIS — Z885 Allergy status to narcotic agent status: Secondary | ICD-10-CM

## 2017-12-09 DIAGNOSIS — Z23 Encounter for immunization: Secondary | ICD-10-CM

## 2017-12-09 DIAGNOSIS — E2740 Unspecified adrenocortical insufficiency: Secondary | ICD-10-CM | POA: Diagnosis present

## 2017-12-09 DIAGNOSIS — Z7982 Long term (current) use of aspirin: Secondary | ICD-10-CM

## 2017-12-09 DIAGNOSIS — I129 Hypertensive chronic kidney disease with stage 1 through stage 4 chronic kidney disease, or unspecified chronic kidney disease: Secondary | ICD-10-CM | POA: Diagnosis present

## 2017-12-09 DIAGNOSIS — E1122 Type 2 diabetes mellitus with diabetic chronic kidney disease: Secondary | ICD-10-CM | POA: Diagnosis present

## 2017-12-09 DIAGNOSIS — E440 Moderate protein-calorie malnutrition: Secondary | ICD-10-CM | POA: Diagnosis present

## 2017-12-09 DIAGNOSIS — Z6820 Body mass index (BMI) 20.0-20.9, adult: Secondary | ICD-10-CM

## 2017-12-09 DIAGNOSIS — N179 Acute kidney failure, unspecified: Principal | ICD-10-CM | POA: Diagnosis present

## 2017-12-09 DIAGNOSIS — E039 Hypothyroidism, unspecified: Secondary | ICD-10-CM | POA: Diagnosis present

## 2017-12-09 DIAGNOSIS — R1032 Left lower quadrant pain: Secondary | ICD-10-CM | POA: Diagnosis present

## 2017-12-09 DIAGNOSIS — D638 Anemia in other chronic diseases classified elsewhere: Secondary | ICD-10-CM | POA: Diagnosis present

## 2017-12-09 DIAGNOSIS — Z79899 Other long term (current) drug therapy: Secondary | ICD-10-CM

## 2017-12-09 LAB — BASIC METABOLIC PANEL
ANION GAP: 7 (ref 5–15)
BUN: 17 mg/dL (ref 6–20)
CO2: 25 mmol/L (ref 22–32)
Calcium: 8.5 mg/dL — ABNORMAL LOW (ref 8.9–10.3)
Chloride: 103 mmol/L (ref 101–111)
Creatinine, Ser: 1.61 mg/dL — ABNORMAL HIGH (ref 0.44–1.00)
GFR calc Af Amer: 40 mL/min — ABNORMAL LOW (ref >60)
GFR, EST NON AFRICAN AMERICAN: 34 mL/min — AB (ref >60)
GLUCOSE: 112 mg/dL — AB (ref 65–99)
POTASSIUM: 4.3 mmol/L (ref 3.5–5.1)
Sodium: 135 mmol/L (ref 135–145)

## 2017-12-09 LAB — CBC WITH DIFFERENTIAL/PLATELET
BASOS ABS: 0 10*3/uL (ref 0.0–0.1)
Basophils Relative: 1 %
Eosinophils Absolute: 0.2 10*3/uL (ref 0.0–0.7)
Eosinophils Relative: 4 %
HEMATOCRIT: 23.7 % — AB (ref 36.0–46.0)
Hemoglobin: 8.1 g/dL — ABNORMAL LOW (ref 12.0–15.0)
LYMPHS PCT: 38 %
Lymphs Abs: 2.1 10*3/uL (ref 0.7–4.0)
MCH: 29 pg (ref 26.0–34.0)
MCHC: 34.2 g/dL (ref 30.0–36.0)
MCV: 84.9 fL (ref 78.0–100.0)
MONO ABS: 0.3 10*3/uL (ref 0.1–1.0)
Monocytes Relative: 5 %
NEUTROS ABS: 2.9 10*3/uL (ref 1.7–7.7)
Neutrophils Relative %: 52 %
Platelets: 189 10*3/uL (ref 150–400)
RBC: 2.79 MIL/uL — AB (ref 3.87–5.11)
RDW: 13.8 % (ref 11.5–15.5)
WBC: 5.6 10*3/uL (ref 4.0–10.5)

## 2017-12-09 MED ORDER — ASPIRIN 81 MG PO CHEW
81.0000 mg | CHEWABLE_TABLET | Freq: Every day | ORAL | Status: DC
  Administered 2017-12-10 – 2017-12-11 (×2): 81 mg via ORAL
  Filled 2017-12-09 (×2): qty 1

## 2017-12-09 MED ORDER — SODIUM CHLORIDE 0.9 % IV SOLN
INTRAVENOUS | Status: DC
  Administered 2017-12-09 – 2017-12-10 (×2): via INTRAVENOUS

## 2017-12-09 MED ORDER — LEVOTHYROXINE SODIUM 25 MCG PO TABS
125.0000 ug | ORAL_TABLET | Freq: Every day | ORAL | Status: DC
  Administered 2017-12-10 – 2017-12-11 (×2): 125 ug via ORAL
  Filled 2017-12-09 (×2): qty 1

## 2017-12-09 MED ORDER — AMLODIPINE BESYLATE 5 MG PO TABS
5.0000 mg | ORAL_TABLET | Freq: Every day | ORAL | Status: DC
  Administered 2017-12-09 – 2017-12-11 (×3): 5 mg via ORAL
  Filled 2017-12-09 (×3): qty 1

## 2017-12-09 MED ORDER — IOPAMIDOL (ISOVUE-300) INJECTION 61%
INTRAVENOUS | Status: AC
  Administered 2017-12-09: 21:00:00
  Filled 2017-12-09: qty 30

## 2017-12-09 MED ORDER — ADULT MULTIVITAMIN W/MINERALS CH
1.0000 | ORAL_TABLET | Freq: Every day | ORAL | Status: DC
  Administered 2017-12-10 – 2017-12-11 (×2): 1 via ORAL
  Filled 2017-12-09 (×2): qty 1

## 2017-12-09 MED ORDER — HEPARIN SODIUM (PORCINE) 5000 UNIT/ML IJ SOLN
5000.0000 [IU] | Freq: Three times a day (TID) | INTRAMUSCULAR | Status: DC
  Administered 2017-12-09 – 2017-12-11 (×5): 5000 [IU] via SUBCUTANEOUS
  Filled 2017-12-09 (×5): qty 1

## 2017-12-09 MED ORDER — INFLUENZA VAC SPLIT QUAD 0.5 ML IM SUSY
0.5000 mL | PREFILLED_SYRINGE | INTRAMUSCULAR | Status: AC
  Administered 2017-12-11: 0.5 mL via INTRAMUSCULAR
  Filled 2017-12-09: qty 0.5

## 2017-12-09 MED ORDER — PANTOPRAZOLE SODIUM 40 MG PO TBEC
40.0000 mg | DELAYED_RELEASE_TABLET | Freq: Every day | ORAL | Status: DC
  Administered 2017-12-10 – 2017-12-11 (×2): 40 mg via ORAL
  Filled 2017-12-09 (×3): qty 1

## 2017-12-09 MED ORDER — HYDROCORTISONE 5 MG PO TABS
5.0000 mg | ORAL_TABLET | Freq: Two times a day (BID) | ORAL | Status: DC
  Administered 2017-12-09 – 2017-12-11 (×4): 5 mg via ORAL
  Filled 2017-12-09 (×4): qty 1

## 2017-12-09 NOTE — H&P (Signed)
Referring Physician: Dixie Dials, MD  [name] is an 56 y.o. female.                       Chief Complaint: Abdominal pain and weakness  HPI: 56 year old female has a 2-week history of poor oral intake due to bilateral lower quadrant area abdominal pain. She also has weakness and dizziness with a past medical history of type 2 diabetes mellitus, hypothyroidism, and renal insufficiency, anemia of chronic disease and hypertension. Patient denies any chest pain, fever and chills.  Your anemia not been partial number  Past Medical History:  Diagnosis Date  . Diabetes mellitus without complication ([place])   . Hypertension   . Thyroid disease       Past Surgical History:  Procedure Laterality Date  . CESAREAN SECTION      Family History  Problem Relation Age of Onset  . Diabetes Mother   . Hyperlipidemia Father   . Hypertension Father    Social History:  reports that  has never smoked. she has never used smokeless tobacco. She reports that she does not drink alcohol or use drugs.  Allergies:  Allergies  Allergen Reactions  . Percocet [Oxycodone-Acetaminophen] Rash    Medications Prior to Admission  Medication Sig Dispense Refill  . amLODipine (NORVASC) 5 MG tablet Take 5 mg by mouth daily.    Marland Kitchen aspirin 81 MG chewable tablet Chew 81 mg by mouth daily.    . hydrocortisone (CORTEF) 5 MG tablet Take 5 mg by mouth 2 (two) times daily.    Marland Kitchen levothyroxine (SYNTHROID, LEVOTHROID) 125 MCG tablet Take 125 mcg by mouth daily before breakfast.    . Multiple Vitamin (MULTIVITAMIN WITH MINERALS) TABS tablet Take 1 tablet by mouth daily.    . pantoprazole (PROTONIX) 40 MG tablet Take 40 mg by mouth daily.      No results found for this or any previous visit (from the past 48 hour(s)). No results found.  Review Of Systems Constitutional: No fever, chills, weight loss or gain. Eyes: No vision change, wears glasses. No discharge or pain. Ears: No hearing loss, No  tinnitus. Respiratory: No asthma, COPD, pneumonias. No shortness of breath. No hemoptysis. Cardiovascular: No chest pain, palpitation, leg edema. Gastrointestinal: Positive nausea, vomiting, no diarrhea, constipation. No GI bleed. No hepatitis. Genitourinary: No dysuria, hematuria, kidney stone. No incontinance. Neurological: Positive headache, no stroke, no seizures.  Psychiatry: No psych facility admission for anxiety, depression, suicide. No detox. Skin: No rash. Musculoskeletal: Positive joint pain, fibromyalgia. No neck pain, back pain. Lymphadenopathy: No lymphadenopathy. Hematology: Positive anemia or easy bruising.   Blood pressure (!) 178/55, pulse 74, temperature 98.4 F (36.9 C), temperature source Oral, resp. rate 18, SpO2 100 %. There is no height or weight on file to calculate BMI. General appearance: alert, cooperative, appears stated age and no distress Head: Normocephalic, atraumatic. Dry, pale pink tongue. Eyes: Brown eyes, pink conjunctiva, corneas clear. PERRL, EOM's intact. Neck: No adenopathy, no carotid bruit, no JVD, supple, symmetrical, trachea midline and thyroid not enlarged. Resp: Clear to auscultation bilaterally. Cardio: Regular rate and rhythm, S1, S2 normal, II/VI systolic murmur, no click, rub or gallop GI: Soft, RLQ and LLQ-tender; bowel sounds normal; no organomegaly. Extremities: No edema, cyanosis or clubbing. Skin: Warm and dry.  Neurologic: Alert and oriented X 3, normal strength. Normal coordination and slow gait.  Assessment/Plan Dehydration Abdominal pain R/O diverticulitis R/O constipation Type II DM Hypertension Hypothyroidism H/O chronic adrenal insufficiency Anemia of chronic  disease Moderate protein calorie malnutrition CKD, III  Admit IV fluids. CT abdomen with oral contrast Renal consult if creatinine is not improving.  Birdie Riddle, MD  12/09/2017, 7:50 PM

## 2017-12-09 NOTE — Progress Notes (Signed)
Pt arrived to 3W20 via wheelchair, Alert and oriented.  No orders at this time, MD office called.  Will continue to monitor.  Cori Razor, RN

## 2017-12-10 ENCOUNTER — Other Ambulatory Visit: Payer: Self-pay

## 2017-12-10 LAB — IRON AND TIBC
Iron: 35 ug/dL (ref 28–170)
Saturation Ratios: 13 % (ref 10.4–31.8)
TIBC: 260 ug/dL (ref 250–450)
UIBC: 225 ug/dL

## 2017-12-10 LAB — URINALYSIS, ROUTINE W REFLEX MICROSCOPIC
BILIRUBIN URINE: NEGATIVE
GLUCOSE, UA: NEGATIVE mg/dL
Ketones, ur: NEGATIVE mg/dL
LEUKOCYTES UA: NEGATIVE
NITRITE: NEGATIVE
PH: 7 (ref 5.0–8.0)
Protein, ur: NEGATIVE mg/dL
SPECIFIC GRAVITY, URINE: 1.003 — AB (ref 1.005–1.030)

## 2017-12-10 LAB — BASIC METABOLIC PANEL
Anion gap: 6 (ref 5–15)
BUN: 14 mg/dL (ref 6–20)
CHLORIDE: 105 mmol/L (ref 101–111)
CO2: 22 mmol/L (ref 22–32)
CREATININE: 1.48 mg/dL — AB (ref 0.44–1.00)
Calcium: 8.4 mg/dL — ABNORMAL LOW (ref 8.9–10.3)
GFR calc non Af Amer: 38 mL/min — ABNORMAL LOW (ref >60)
GFR, EST AFRICAN AMERICAN: 44 mL/min — AB (ref >60)
GLUCOSE: 122 mg/dL — AB (ref 65–99)
Potassium: 4.7 mmol/L (ref 3.5–5.1)
Sodium: 133 mmol/L — ABNORMAL LOW (ref 135–145)

## 2017-12-10 LAB — VITAMIN B12: Vitamin B-12: 399 pg/mL (ref 180–914)

## 2017-12-10 LAB — FOLATE: FOLATE: 36 ng/mL (ref >5.9)

## 2017-12-10 LAB — RETICULOCYTES
RBC.: 2.93 MIL/uL — AB (ref 3.87–5.11)
Retic Count, Absolute: 46.9 10*3/uL (ref 19.0–186.0)
Retic Ct Pct: 1.6 % (ref 0.4–3.1)

## 2017-12-10 LAB — FERRITIN: FERRITIN: 151 ng/mL (ref 11–307)

## 2017-12-10 MED ORDER — BOOST / RESOURCE BREEZE PO LIQD CUSTOM
1.0000 | Freq: Three times a day (TID) | ORAL | Status: DC
  Administered 2017-12-10 – 2017-12-11 (×3): 1 via ORAL

## 2017-12-10 NOTE — Progress Notes (Signed)
Subjective:  Patient denies any chest pain states abdominal pain improved. Renal function slowly improving.  Objective:  Vital Signs in the last 24 hours: Temp:  [98.2 F (36.8 C)-98.6 F (37 C)] 98.6 F (37 C) (12/22 1015) Pulse Rate:  [63-77] 68 (12/22 1015) Resp:  [16-20] 20 (12/22 1015) BP: (148-187)/(55-74) 187/59 (12/22 1015) SpO2:  [99 %-100 %] 100 % (12/22 1015) Weight:  [52 kg (114 lb 10.2 oz)] 52 kg (114 lb 10.2 oz) (12/22 0443)  Intake/Output from previous day: 12/21 0701 - 12/22 0700 In: 546.3 [I.V.:546.3] Out: -  Intake/Output from this shift: No intake/output data recorded.  Physical Exam: Neck: no adenopathy, no carotid bruit, no JVD and supple, symmetrical, trachea midline Lungs: clear to auscultation bilaterally Heart: regular rate and rhythm, S1, S2 normal and Soft systolic murmur noted Abdomen: soft, non-tender; bowel sounds normal; no masses,  no organomegaly Extremities: extremities normal, atraumatic, no cyanosis or edema  Lab Results: Recent Labs    12/09/17 2014  WBC 5.6  HGB 8.1*  PLT 189   Recent Labs    12/09/17 2014 12/10/17 0320  NA 135 133*  K 4.3 4.7  CL 103 105  CO2 25 22  GLUCOSE 112* 122*  BUN 17 14  CREATININE 1.61* 1.48*   No results for input(s): TROPONINI in the last 72 hours.  Invalid input(s): CK, MB Hepatic Function Panel No results for input(s): PROT, ALBUMIN, AST, ALT, ALKPHOS, BILITOT, BILIDIR, IBILI in the last 72 hours. No results for input(s): CHOL in the last 72 hours. No results for input(s): PROTIME in the last 72 hours.  Imaging: Imaging results have been reviewed and Ct Abdomen Pelvis Wo Contrast  Result Date: 12/10/2017 CLINICAL DATA:  Abdominal pain, diverticulitis suspected. EXAM: CT ABDOMEN AND PELVIS WITHOUT CONTRAST TECHNIQUE: Multidetector CT imaging of the abdomen and pelvis was performed following the standard protocol without IV contrast. COMPARISON:  05/13/2011 FINDINGS: Lower chest:  Trace  pericardial fluid. Hepatobiliary: No focal liver abnormality.No evidence of biliary obstruction or stone. Pancreas: Unremarkable. Spleen: Unremarkable. Adrenals/Urinary Tract: Negative adrenals. Renal atrophy, with 9 cm length on the left and a similar size on the right. 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 Unremarkable bladder. Stomach/Bowel: No obstruction. No appendicitis. Clinical concern for diverticulitis. No noted diverticula. Vascular/Lymphatic: No acute vascular finding. Atherosclerotic calcification of the aorta. Retroperitoneal structures are somewhat indistinct, likely mild retroperitoneal edema. No focal inflammatory process is seen. Negative for adenopathy Reproductive:Negative Other: No ascites or pneumoperitoneum. Musculoskeletal: No acute or aggressive finding IMPRESSION: 1. No acute finding to explain abdominal pain. 2. Renal atrophy. 3.  Aortic Atherosclerosis (ICD10-I70.0). Electronically Signed   By: Monte Fantasia M.D.   On: 12/10/2017 06:41    Cardiac Studies:  Assessment/Plan:  Status post abdominal pain questionable etiology Resolving acute kidney injury Resolving dehydration Type II DM Hypertension Hypothyroidism H/O chronic adrenal insufficiency Anemia of chronic disease Moderate protein calorie malnutrition Plan Continue IV hydration Check labs Check anemia panel   LOS: 1 day    Charolette Forward 12/10/2017, 11:05 AM

## 2017-12-10 NOTE — Progress Notes (Signed)
Initial Nutrition Assessment  DOCUMENTATION CODES:   Not applicable  INTERVENTION:    Boost Breeze po TID, each supplement provides 250 kcal and 9 grams of protein  NUTRITION DIAGNOSIS:   Inadequate oral intake related to nausea, poor appetite as evidenced by per patient/family report.  GOAL:   Patient will meet greater than or equal to 90% of their needs  MONITOR:   PO intake, Diet advancement, Supplement acceptance  REASON FOR ASSESSMENT:   Malnutrition Screening Tool    ASSESSMENT:   56 yo female with PMH of HTN, hypothyroidism, DM who was admitted on 12/21 with dehydration, abdominal pain, weakness.  Spoke with patient and two daughters in room with patient. They report that she had lost weight down to ~95 lbs a few months ago, but has gained weight recently. Patient has been following a renal diet at home. Four daughters and one son all help with her care and cooking. Patient does not like Ensure, but agreed to try Boost Breeze between meals to maximize intake and maintain lean body mass.   Per review of weight encounters, weight seems to have increased over the past 5 months from 94 lbs in July to 114 lbs today. Labs reviewed. Sodium 133 (L) Medications reviewed and include MVI.  NUTRITION - FOCUSED PHYSICAL EXAM:    Most Recent Value  Orbital Region  No depletion  Upper Arm Region  No depletion  Thoracic and Lumbar Region  Mild depletion  Buccal Region  No depletion  Temple Region  No depletion  Clavicle Bone Region  Mild depletion  Clavicle and Acromion Bone Region  Mild depletion  Scapular Bone Region  No depletion  Dorsal Hand  Mild depletion  Patellar Region  Mild depletion  Anterior Thigh Region  Unable to assess  Posterior Calf Region  Mild depletion  Edema (RD Assessment)  None  Hair  Reviewed  Eyes  Reviewed  Mouth  Reviewed  Skin  Reviewed  Nails  Reviewed       Diet Order:  Diet full liquid Room service appropriate? Yes; Fluid  consistency: Thin  EDUCATION NEEDS:   No education needs have been identified at this time  Skin:  Skin Assessment: Reviewed RN Assessment  Last BM:  unknown  Height:   Ht Readings from Last 1 Encounters:  12/10/17 5\' 3"  (1.6 m)    Weight:   Wt Readings from Last 1 Encounters:  12/10/17 114 lb 10.2 oz (52 kg)    Ideal Body Weight:  52.3 kg  BMI:  Body mass index is 20.31 kg/m.  Estimated Nutritional Needs:   Kcal:  1550-1750  Protein:  70-85 gm  Fluid:  1.6-1.8 L   Molli Barrows, RD, LDN, CNSC Pager [phone] After Hours Pager [phone]

## 2017-12-11 LAB — BASIC METABOLIC PANEL
ANION GAP: 5 (ref 5–15)
BUN: 11 mg/dL (ref 6–20)
CALCIUM: 8.5 mg/dL — AB (ref 8.9–10.3)
CO2: 21 mmol/L — AB (ref 22–32)
Chloride: 108 mmol/L (ref 101–111)
Creatinine, Ser: 1.36 mg/dL — ABNORMAL HIGH (ref 0.44–1.00)
GFR calc Af Amer: 49 mL/min — ABNORMAL LOW (ref >60)
GFR, EST NON AFRICAN AMERICAN: 42 mL/min — AB (ref >60)
GLUCOSE: 110 mg/dL — AB (ref 65–99)
Potassium: 4.7 mmol/L (ref 3.5–5.1)
Sodium: 134 mmol/L — ABNORMAL LOW (ref 135–145)

## 2017-12-11 LAB — CBC
HEMATOCRIT: 26.6 % — AB (ref 36.0–46.0)
HEMOGLOBIN: 9 g/dL — AB (ref 12.0–15.0)
MCH: 28.7 pg (ref 26.0–34.0)
MCHC: 33.8 g/dL (ref 30.0–36.0)
MCV: 84.7 fL (ref 78.0–100.0)
Platelets: 231 10*3/uL (ref 150–400)
RBC: 3.14 MIL/uL — ABNORMAL LOW (ref 3.87–5.11)
RDW: 13.9 % (ref 11.5–15.5)
WBC: 10.3 10*3/uL (ref 4.0–10.5)

## 2017-12-11 MED ORDER — AMOXICILLIN-POT CLAVULANATE 875-125 MG PO TABS: 1.0000 | ORAL_TABLET | Freq: Two times a day (BID) | ORAL | 0 refills | Status: DC

## 2017-12-11 MED ORDER — AMOXICILLIN-POT CLAVULANATE 875-125 MG PO TABS
1.0000 | ORAL_TABLET | Freq: Two times a day (BID) | ORAL | Status: DC
  Administered 2017-12-11: 1 via ORAL
  Filled 2017-12-11: qty 1

## 2017-12-11 NOTE — Progress Notes (Signed)
Patient ready for discharge to home; discharge instructions given and reviewed; Rx sent electronically; patient discharged out via wheelchair accompanied by her family home.

## 2017-12-11 NOTE — Discharge Instructions (Signed)
Dehydration, Adult Dehydration is when there is not enough fluid or water in your body. This happens when you lose more fluids than you take in. Dehydration can range from mild to very bad. It should be treated right away to keep it from getting very bad. Symptoms of mild dehydration may include:  Thirst.  Dry lips.  Slightly dry mouth.  Dry, warm skin.  Dizziness. Symptoms of moderate dehydration may include:  Very dry mouth.  Muscle cramps.  Dark pee (urine). Pee may be the color of tea.  Your body making less pee.  Your eyes making fewer tears.  Heartbeat that is uneven or faster than normal (palpitations).  Headache.  Light-headedness, especially when you stand up from sitting.  Fainting (syncope). Symptoms of very bad dehydration may include:  Changes in skin, such as: ? Cold and clammy skin. ? Blotchy (mottled) or pale skin. ? Skin that does not quickly return to normal after being lightly pinched and let go (poor skin turgor).  Changes in body fluids, such as: ? Feeling very thirsty. ? Your eyes making fewer tears. ? Not sweating when body temperature is high, such as in hot weather. ? Your body making very little pee.  Changes in vital signs, such as: ? Weak pulse. ? Pulse that is more than 100 beats a minute when you are sitting still. ? Fast breathing. ? Low blood pressure.  Other changes, such as: ? Sunken eyes. ? Cold hands and feet. ? Confusion. ? Lack of energy (lethargy). ? Trouble waking up from sleep. ? Short-term weight loss. ? Unconsciousness. Follow these instructions at home:  If told by your doctor, drink an ORS: ? Make an ORS by using instructions on the package. ? Start by drinking small amounts, about  cup (120 mL) every 5-10 minutes. ? Slowly drink more until you have had the amount that your doctor said to have.  Drink enough clear fluid to keep your pee clear or pale yellow. If you were told to drink an ORS, finish the ORS  first, then start slowly drinking clear fluids. Drink fluids such as: ? Water. Do not drink only water by itself. Doing that can make the salt (sodium) level in your body get too low (hyponatremia). ? Ice chips. ? Fruit juice that you have added water to (diluted). ? Low-calorie sports drinks.  Avoid: ? Alcohol. ? Drinks that have a lot of sugar. These include high-calorie sports drinks, fruit juice that does not have water added, and soda. ? Caffeine. ? Foods that are greasy or have a lot of fat or sugar.  Take over-the-counter and prescription medicines only as told by your doctor.  Do not take salt tablets. Doing that can make the salt level in your body get too high (hypernatremia).  Eat foods that have minerals (electrolytes). Examples include bananas, oranges, potatoes, tomatoes, and spinach.  Keep all follow-up visits as told by your doctor. This is important. Contact a doctor if:  You have belly (abdominal) pain that: ? Gets worse. ? Stays in one area (localizes).  You have a rash.  You have a stiff neck.  You get angry or annoyed more easily than normal (irritability).  You are more sleepy than normal.  You have a harder time waking up than normal.  You feel: ? Weak. ? Dizzy. ? Very thirsty.  You have peed (urinated) only a small amount of very dark pee during 6-8 hours. Get help right away if:  You have symptoms of   very bad dehydration.  You cannot drink fluids without throwing up (vomiting).  Your symptoms get worse with treatment.  You have a fever.  You have a very bad headache.  You are throwing up or having watery poop (diarrhea) and it: ? Gets worse. ? Does not go away.  You have blood or something green (bile) in your throw-up.  You have blood in your poop (stool). This may cause poop to look black and tarry.  You have not peed in 6-8 hours.  You pass out (faint).  Your heart rate when you are sitting still is more than 100 beats a  minute.  You have trouble breathing. This information is not intended to replace advice given to you by your health care provider. Make sure you discuss any questions you have with your health care provider. Document Released: 10/02/2009 Document Revised: 06/25/2016 Document Reviewed: 01/30/2016 Elsevier Interactive Patient Education  2018 [employer]  

## 2017-12-11 NOTE — Discharge Summary (Signed)
Discharge summary dictated on 12/11/2017 dictation number is [phone]

## 2017-12-14 NOTE — Discharge Summary (Signed)
[name], [name]              ACCOUNT NO.:  [id number]  MEDICAL RECORD NO.:  [id number]  LOCATION:                                 FACILITY:  PHYSICIAN:  [name], M.D.      DATE OF BIRTH:  DATE OF ADMISSION:  12/09/2017 DATE OF DISCHARGE:  12/11/2017                              DISCHARGE SUMMARY   ADMITTING DIAGNOSES: 1. Dehydration. 2. Abdominal pain, rule out diverticulitis, rule out constipation. 3. Type 2 diabetes mellitus. 4. Acute kidney injury. 5. Hypertension. 6. Hypothyroidism. 7. History of chronic adrenal insufficiency. 8. Anemia of chronic disease. 9. Moderate protein-calorie malnutrition. 10.Acute on chronic kidney disease stage 3.  FINAL DIAGNOSES: 1. Status post abdominal pain, questionable etiology. 2. Resolving acute kidney injury on chronic kidney disease stage 2,     improved. 3. Status post dehydration. 4. Diabetes mellitus. 5. Hypertension. 6. Hypothyroidism. 7. History of chronic adrenal insufficiency. 8. Anemia of chronic disease. 9. Protein-calorie malnutrition. 10.Possibly bronchitis.  DISCHARGE HOME MEDICATIONS: 1. Augmentin 875/125 mg 1 tablet q.12 hours for 7 days. 2. Norvasc 10 mg daily. 3. Aspirin 81 mg daily. 4. Hydrocortisone 5 mg tablet 1/2 tablet twice daily. 5. Levothyroxine 125 mcg daily. 6. Multivitamin with mineral 1 tablet daily. 7. Protonix 40 mg daily.  DIET:  Low salt, low cholesterol.  ACTIVITY:  As tolerated.  The patient has been advised to drink Boost 1 can twice daily.  CONDITION AT DISCHARGE:  Stable.  FOLLOWUP:  Follow up with Dr. Doylene Soto in 1 week.  BRIEF HISTORY AND HOSPITAL COURSE:  [name] is a 56 year old female with a 2-week history of poor oral intake due to bilateral lower quadrant area abdominal pain.  She also had weakness and dizziness with a past medical history significant of diabetes mellitus, hypothyroidism, renal insufficiency, anemia of chronic disease, and hypertension.   The patient denies any chest pain, fever, or chills.  PHYSICAL EXAMINATION:  GENERAL:  She was alert, awake, oriented x3. VITAL SIGNS:  Blood pressure 178/55, pulse 74.  She was afebrile. HEENT:  Conjunctivae were pink. NECK:  Supple.  No JVD.  No bruit. LUNGS:  Clear to auscultation. CARDIOVASCULAR:  S1, S2 were normal.  There was 2/6 systolic murmur.  No click, rub, or gallop. ABDOMEN:  Soft.  Right lower quadrant and left lower quadrant mildly tenderness.  Bowel sounds were normal.  No organomegaly. EXTREMITIES:  There was no clubbing, cyanosis, or edema. NEUROLOGIC:  Grossly intact.  LABORATORY DATA:  Her sodium was 135, potassium 4.3, BUN 17, creatinine 1.61.  Her hemoglobin was 8.1, hematocrit 23.7, white count of 5.6. Repeat hemoglobin today is 9, hematocrit 26.6, white count of 10.3 which has been stable.  The patient had a CT of the abdomen done, which showed no acute finding to explain abdominal pain, renal atrophy, aortic atherosclerosis.  Repeat electrolytes today:  Sodium is 134, potassium 4.7, BUN 11, creatinine has come down to 1.36, glucose is 110.  BRIEF HOSPITAL COURSE:  The patient was admitted to regular floor, was started on IV fluids and continued on home medications with gradual improvement in her renal function.  The patient did not have any further episodes of abdominal pain  or urinary complaints during the hospital stay.  The patient complains of coughing today associated with mild left earache with no discharge.  No sinus pain.  The patient states overall he feels better.  Will be started on Augmentin for possible early bronchitis.  The patient will follow up closely with Dr. Doylene Soto early next week.  The patient has been advised to take Boost drinks regularly.     [name]. Terrence Soto, M.D.   ______________________________ [name]. Terrence Soto, M.D.    MNH/MEDQ  D:  12/11/2017  T:  12/11/2017  Job:  [id number]

## 2018-03-27 ENCOUNTER — Inpatient Hospital Stay (HOSPITAL_COMMUNITY)
Admission: EM | Admit: 2018-03-27 | Discharge: 2018-03-31 | DRG: 683 | Disposition: A | Discharge status: Home / Self Care | Payer: Self-pay | Attending: Cardiovascular Disease | Admitting: Cardiovascular Disease

## 2018-03-27 ENCOUNTER — Encounter (HOSPITAL_COMMUNITY): Payer: Self-pay

## 2018-03-27 ENCOUNTER — Other Ambulatory Visit: Payer: Self-pay

## 2018-03-27 ENCOUNTER — Emergency Department (HOSPITAL_COMMUNITY): Payer: Self-pay

## 2018-03-27 DIAGNOSIS — N184 Chronic kidney disease, stage 4 (severe): Secondary | ICD-10-CM | POA: Diagnosis present

## 2018-03-27 DIAGNOSIS — D638 Anemia in other chronic diseases classified elsewhere: Secondary | ICD-10-CM | POA: Diagnosis present

## 2018-03-27 DIAGNOSIS — K5900 Constipation, unspecified: Secondary | ICD-10-CM | POA: Diagnosis present

## 2018-03-27 DIAGNOSIS — Z8249 Family history of ischemic heart disease and other diseases of the circulatory system: Secondary | ICD-10-CM

## 2018-03-27 DIAGNOSIS — E1165 Type 2 diabetes mellitus with hyperglycemia: Secondary | ICD-10-CM | POA: Diagnosis present

## 2018-03-27 DIAGNOSIS — N183 Chronic kidney disease, stage 3 unspecified: Secondary | ICD-10-CM

## 2018-03-27 DIAGNOSIS — Z833 Family history of diabetes mellitus: Secondary | ICD-10-CM

## 2018-03-27 DIAGNOSIS — R112 Nausea with vomiting, unspecified: Secondary | ICD-10-CM

## 2018-03-27 DIAGNOSIS — E2740 Unspecified adrenocortical insufficiency: Secondary | ICD-10-CM | POA: Diagnosis present

## 2018-03-27 DIAGNOSIS — I129 Hypertensive chronic kidney disease with stage 1 through stage 4 chronic kidney disease, or unspecified chronic kidney disease: Secondary | ICD-10-CM | POA: Diagnosis present

## 2018-03-27 DIAGNOSIS — E039 Hypothyroidism, unspecified: Secondary | ICD-10-CM | POA: Diagnosis present

## 2018-03-27 DIAGNOSIS — N179 Acute kidney failure, unspecified: Principal | ICD-10-CM | POA: Diagnosis present

## 2018-03-27 DIAGNOSIS — Z681 Body mass index (BMI) 19 or less, adult: Secondary | ICD-10-CM

## 2018-03-27 DIAGNOSIS — E875 Hyperkalemia: Secondary | ICD-10-CM | POA: Diagnosis present

## 2018-03-27 DIAGNOSIS — E440 Moderate protein-calorie malnutrition: Secondary | ICD-10-CM | POA: Diagnosis present

## 2018-03-27 DIAGNOSIS — E871 Hypo-osmolality and hyponatremia: Secondary | ICD-10-CM | POA: Diagnosis present

## 2018-03-27 DIAGNOSIS — E272 Addisonian crisis: Secondary | ICD-10-CM | POA: Diagnosis present

## 2018-03-27 DIAGNOSIS — R109 Unspecified abdominal pain: Secondary | ICD-10-CM

## 2018-03-27 DIAGNOSIS — E1122 Type 2 diabetes mellitus with diabetic chronic kidney disease: Secondary | ICD-10-CM | POA: Diagnosis present

## 2018-03-27 LAB — URINALYSIS, ROUTINE W REFLEX MICROSCOPIC
BACTERIA UA: NONE SEEN
BILIRUBIN URINE: NEGATIVE
Glucose, UA: NEGATIVE mg/dL
Ketones, ur: NEGATIVE mg/dL
NITRITE: NEGATIVE
PH: 5 (ref 5.0–8.0)
Protein, ur: NEGATIVE mg/dL
SPECIFIC GRAVITY, URINE: 1.017 (ref 1.005–1.030)

## 2018-03-27 LAB — CBC WITH DIFFERENTIAL/PLATELET
BASOS ABS: 0 10*3/uL (ref 0.0–0.1)
BASOS PCT: 0 %
EOS ABS: 0.2 10*3/uL (ref 0.0–0.7)
Eosinophils Relative: 3 %
HCT: 30 % — ABNORMAL LOW (ref 36.0–46.0)
HEMOGLOBIN: 10.3 g/dL — AB (ref 12.0–15.0)
LYMPHS PCT: 15 %
Lymphs Abs: 1.1 10*3/uL (ref 0.7–4.0)
MCH: 28.5 pg (ref 26.0–34.0)
MCHC: 34.3 g/dL (ref 30.0–36.0)
MCV: 82.9 fL (ref 78.0–100.0)
MONO ABS: 0.3 10*3/uL (ref 0.1–1.0)
Monocytes Relative: 4 %
NEUTROS PCT: 78 %
Neutro Abs: 5.6 10*3/uL (ref 1.7–7.7)
PLATELETS: 238 10*3/uL (ref 150–400)
RBC: 3.62 MIL/uL — AB (ref 3.87–5.11)
RDW: 14.7 % (ref 11.5–15.5)
WBC: 7.2 10*3/uL (ref 4.0–10.5)

## 2018-03-27 LAB — COMPREHENSIVE METABOLIC PANEL
ALBUMIN: 3.7 g/dL (ref 3.5–5.0)
ALT: 85 U/L — ABNORMAL HIGH (ref 14–54)
ANION GAP: 9 (ref 5–15)
AST: 237 U/L — ABNORMAL HIGH (ref 15–41)
Alkaline Phosphatase: 69 U/L (ref 38–126)
BILIRUBIN TOTAL: 0.8 mg/dL (ref 0.3–1.2)
BUN: 18 mg/dL (ref 6–20)
CALCIUM: 8.4 mg/dL — AB (ref 8.9–10.3)
CO2: 21 mmol/L — ABNORMAL LOW (ref 22–32)
Chloride: 97 mmol/L — ABNORMAL LOW (ref 101–111)
Creatinine, Ser: 2.25 mg/dL — ABNORMAL HIGH (ref 0.44–1.00)
GFR calc non Af Amer: 23 mL/min — ABNORMAL LOW (ref >60)
GFR, EST AFRICAN AMERICAN: 26 mL/min — AB (ref >60)
GLUCOSE: 185 mg/dL — AB (ref 65–99)
POTASSIUM: 5.7 mmol/L — AB (ref 3.5–5.1)
Sodium: 127 mmol/L — ABNORMAL LOW (ref 135–145)
Total Protein: 5.3 g/dL — ABNORMAL LOW (ref 6.5–8.1)

## 2018-03-27 LAB — PROTIME-INR
INR: 1.04
PROTHROMBIN TIME: 13.5 s (ref 11.4–15.2)

## 2018-03-27 LAB — I-STAT BETA HCG BLOOD, ED (MC, WL, AP ONLY)

## 2018-03-27 LAB — I-STAT CG4 LACTIC ACID, ED: Lactic Acid, Venous: 1.79 mmol/L (ref 0.5–1.9)

## 2018-03-27 LAB — CBG MONITORING, ED: Glucose-Capillary: 187 mg/dL — ABNORMAL HIGH (ref 65–99)

## 2018-03-27 MED ORDER — HYDROCORTISONE NA SUCCINATE PF 100 MG IJ SOLR
100.0000 mg | Freq: Once | INTRAMUSCULAR | Status: AC
  Administered 2018-03-27: 100 mg via INTRAVENOUS
  Filled 2018-03-27: qty 2

## 2018-03-27 MED ORDER — ADULT MULTIVITAMIN W/MINERALS CH
1.0000 | ORAL_TABLET | Freq: Every day | ORAL | Status: DC
  Administered 2018-03-27 – 2018-03-31 (×5): 1 via ORAL
  Filled 2018-03-27 (×5): qty 1

## 2018-03-27 MED ORDER — ASPIRIN 81 MG PO CHEW
81.0000 mg | CHEWABLE_TABLET | Freq: Every day | ORAL | Status: DC
  Administered 2018-03-27 – 2018-03-31 (×5): 81 mg via ORAL
  Filled 2018-03-27 (×5): qty 1

## 2018-03-27 MED ORDER — PIPERACILLIN-TAZOBACTAM 3.375 G IVPB
3.3750 g | Freq: Three times a day (TID) | INTRAVENOUS | Status: DC
  Administered 2018-03-27 – 2018-03-31 (×13): 3.375 g via INTRAVENOUS
  Filled 2018-03-27 (×12): qty 50

## 2018-03-27 MED ORDER — HYDROCORTISONE 10 MG PO TABS
10.0000 mg | ORAL_TABLET | Freq: Two times a day (BID) | ORAL | Status: DC
  Administered 2018-03-27 – 2018-03-31 (×8): 10 mg via ORAL
  Filled 2018-03-27 (×9): qty 1

## 2018-03-27 MED ORDER — VANCOMYCIN HCL IN DEXTROSE 750-5 MG/150ML-% IV SOLN
750.0000 mg | INTRAVENOUS | Status: DC
  Administered 2018-03-28 – 2018-03-30 (×2): 750 mg via INTRAVENOUS
  Filled 2018-03-27 (×2): qty 150

## 2018-03-27 MED ORDER — SODIUM CHLORIDE 0.9 % IV SOLN
INTRAVENOUS | Status: DC
Start: 2018-03-27 — End: 2018-03-30
  Administered 2018-03-27 – 2018-03-30 (×3): via INTRAVENOUS

## 2018-03-27 MED ORDER — ACETAMINOPHEN 500 MG PO TABS
1000.0000 mg | ORAL_TABLET | Freq: Once | ORAL | Status: AC
  Administered 2018-03-27: 1000 mg via ORAL
  Filled 2018-03-27: qty 2

## 2018-03-27 MED ORDER — SODIUM CHLORIDE 0.9 % IV BOLUS (SEPSIS)
250.0000 mL | Freq: Once | INTRAVENOUS | Status: AC
  Administered 2018-03-27: 250 mL via INTRAVENOUS

## 2018-03-27 MED ORDER — ACETAMINOPHEN 325 MG PO TABS
650.0000 mg | ORAL_TABLET | Freq: Four times a day (QID) | ORAL | Status: DC | PRN
Start: 2018-03-27 — End: 2018-03-31
  Administered 2018-03-27: 650 mg via ORAL
  Filled 2018-03-27: qty 2

## 2018-03-27 MED ORDER — HEPARIN SODIUM (PORCINE) 5000 UNIT/ML IJ SOLN
5000.0000 [IU] | Freq: Three times a day (TID) | INTRAMUSCULAR | Status: DC
  Administered 2018-03-27 – 2018-03-31 (×13): 5000 [IU] via SUBCUTANEOUS
  Filled 2018-03-27 (×13): qty 1

## 2018-03-27 MED ORDER — AMLODIPINE BESYLATE 10 MG PO TABS
10.0000 mg | ORAL_TABLET | Freq: Every day | ORAL | Status: DC
  Administered 2018-03-27 – 2018-03-31 (×5): 10 mg via ORAL
  Filled 2018-03-27 (×5): qty 1

## 2018-03-27 MED ORDER — VANCOMYCIN HCL 10 G IV SOLR
1250.0000 mg | Freq: Once | INTRAVENOUS | Status: AC
  Administered 2018-03-27: 1250 mg via INTRAVENOUS
  Filled 2018-03-27: qty 1250

## 2018-03-27 MED ORDER — PANTOPRAZOLE SODIUM 40 MG PO TBEC
40.0000 mg | DELAYED_RELEASE_TABLET | Freq: Every day | ORAL | Status: DC
  Administered 2018-03-27 – 2018-03-31 (×5): 40 mg via ORAL
  Filled 2018-03-27 (×5): qty 1

## 2018-03-27 MED ORDER — SODIUM CHLORIDE 0.9 % IV BOLUS (SEPSIS)
1000.0000 mL | Freq: Once | INTRAVENOUS | Status: AC
  Administered 2018-03-27: 1000 mL via INTRAVENOUS

## 2018-03-27 MED ORDER — PIPERACILLIN-TAZOBACTAM 3.375 G IVPB 30 MIN
3.3750 g | Freq: Once | INTRAVENOUS | Status: AC
  Administered 2018-03-27: 3.375 g via INTRAVENOUS
  Filled 2018-03-27: qty 50

## 2018-03-27 MED ORDER — LEVOTHYROXINE SODIUM 25 MCG PO TABS
125.0000 ug | ORAL_TABLET | Freq: Every day | ORAL | Status: DC
  Administered 2018-03-27 – 2018-03-29 (×3): 125 ug via ORAL
  Filled 2018-03-27 (×3): qty 1

## 2018-03-27 MED ORDER — SODIUM CHLORIDE 0.9 % IV BOLUS (SEPSIS)
500.0000 mL | Freq: Once | INTRAVENOUS | Status: AC
  Administered 2018-03-27: 500 mL via INTRAVENOUS

## 2018-03-27 NOTE — Progress Notes (Signed)
BP continues to run high, 205/56.  MD notified, orders received.  Will continue to monitor.

## 2018-03-27 NOTE — ED Triage Notes (Signed)
Family states she was shaking in bed and now fever and low blood pressure AMS noted.

## 2018-03-27 NOTE — Progress Notes (Signed)
A consult was received from an ED physician for zosyn and vancomycin per pharmacy dosing.  The patient's profile has been reviewed for ht/wt/allergies/indication/available labs.   A one time order has been placed for zosyn 3.375 gm and Vancomycin 1250 mg.  Further antibiotics/pharmacy consults should be ordered by admitting physician if indicated.                       Thank you, Dorrene German 03/27/2018  1:07 AM

## 2018-03-27 NOTE — H&P (Signed)
Referring Physician:  [name] is an 57 y.o. female.                       Chief Complaint: Fever and near syncope  HPI: 57 year old female with h/o of type 2 DM, HTN, Thyroid disease, adrenal insufficiency [place] ED with fever. She vomited several times after dinner and started shivering after she went to bed. She was lethargic. Her blood sugar at home was 227 mg. She had no recent fall, head injury, other sickness or change in medication.  Past Medical History:  Diagnosis Date  . Diabetes mellitus without complication ([place])   . Hypertension   . Thyroid disease       Past Surgical History:  Procedure Laterality Date  . CESAREAN SECTION      Family History  Problem Relation Age of Onset  . Diabetes Mother   . Hyperlipidemia Father   . Hypertension Father    Social History:  reports that she has never smoked. She has never used smokeless tobacco. She reports that she does not drink alcohol or use drugs.  Allergies:  Allergies  Allergen Reactions  . Percocet [Oxycodone-Acetaminophen] Rash     (Not in a hospital admission)  Results for orders placed or performed during the hospital encounter of 03/27/18 (from the past 48 hour(s))  CBG monitoring, ED     Status: Abnormal   Collection Time: 03/27/18 12:47 AM  Result Value Ref Range   Glucose-Capillary 187 (H) 65 - 99 mg/dL  Comprehensive metabolic panel     Status: Abnormal   Collection Time: 03/27/18  1:18 AM  Result Value Ref Range   Sodium 127 (L) 135 - 145 mmol/L    Comment: Performed at [facility], [place] [address] [place], [place] [address]   Potassium 5.7 (H) 3.5 - 5.1 mmol/L    Comment: SLIGHT HEMOLYSIS LIPEMIC SPECIMEN, RESULTS MAY BE AFFECTED. NOTIFIED Sofie Rower RN AT [facility] ON 03/27/18 BY MOHAMED,A Performed at [facility], [place] [address], [place], [place] [address]    Chloride 97 (L) 101 - 111 mmol/L    Comment: Performed at [facility],  [place] [address] [place], [place] [address]   CO2 21 (L) 22 - 32 mmol/L    Comment: Performed at [facility], [place] [address], [place], [place] [address]   Glucose, Bld 185 (H) 65 - 99 mg/dL    Comment: Performed at [facility], [place] [address] [place], [place] [address]   BUN 18 6 - 20 mg/dL    Comment: Performed at [facility], [place] [address], [place], [place] [address]   Creatinine, Ser 2.25 (H) 0.44 - 1.00 mg/dL    Comment: Performed at [facility], [place] [address], [place], [place] [address]   Calcium 8.4 (L) 8.9 - 10.3 mg/dL    Comment: Performed at [facility], [place] [address], [place], [place] [address]   Total Protein 5.3 (L) 6.5 - 8.1 g/dL    Comment: Performed at [facility], [place] [address], [place], [place] [address]   Albumin 3.7 3.5 - 5.0 g/dL    Comment: LIPEMIC SPECIMEN, RESULTS MAY BE AFFECTED. Performed at [facility], [place] [address] [place], [place] [address]    AST 237 (H) 15 - 41 U/L    Comment: POST-ULTRACENTRIFUGATION Performed at [facility], [place] [address], [place], [place]  [address]    ALT 85 (H) 14 - 54 U/L    Comment: POST-ULTRACENTRIFUGATION Performed at [facility], [place] [address], [place], [place] [address]    Alkaline Phosphatase 69 38 - 126 U/L   Total Bilirubin 0.8 0.3 - 1.2 mg/dL   GFR calc non Af Amer 23 (L) >60 mL/min   GFR calc Af Amer 26 (L) >60 mL/min    Comment: (NOTE) The eGFR has been calculated using the CKD EPI equation. This calculation has not been validated in all clinical situations. eGFR's persistently <60 mL/min signify possible Chronic Kidney Disease.    Anion gap 9 5 - 15    Comment: Performed at [facility], [place] [address], [place], [place] [address]  CBC with Differential     Status: Abnormal   Collection Time: 03/27/18  1:18 AM  Result Value Ref Range    WBC 7.2 4.0 - 10.5 K/uL    Comment: WHITE COUNT CONFIRMED ON SMEAR   RBC 3.62 (L) 3.87 - 5.11 MIL/uL   Hemoglobin 10.3 (L) 12.0 - 15.0 g/dL    Comment: CORRECTED FOR INTERFERING SUBSTANCE   HCT 30.0 (L) 36.0 - 46.0 %   MCV 82.9 78.0 - 100.0 fL   MCH 28.5 26.0 - 34.0 pg    Comment: CORRECTED FOR INTERFERING SUBSTANCE   MCHC 34.3 30.0 - 36.0 g/dL    Comment: CORRECTED FOR INTERFERING SUBSTANCE   RDW 14.7 11.5 - 15.5 %   Platelets 238 150 - 400 K/uL    Comment: REPEATED TO VERIFY SPECIMEN CHECKED FOR CLOTS PLATELET COUNT CONFIRMED BY SMEAR    Neutrophils Relative % 78 %   Lymphocytes Relative 15 %   Monocytes Relative 4 %   Eosinophils Relative 3 %   Basophils Relative 0 %   Neutro Abs 5.6 1.7 - 7.7 K/uL   Lymphs Abs 1.1 0.7 - 4.0 K/uL   Monocytes Absolute 0.3 0.1 - 1.0 K/uL   Eosinophils Absolute 0.2 0.0 - 0.7 K/uL   Basophils Absolute 0.0 0.0 - 0.1 K/uL   Smear Review MORPHOLOGY UNREMARKABLE     Comment: Performed at [facility], [place] [address], [place], [place] [address]  Protime-INR     Status: None   Collection Time: 03/27/18  1:18 AM  Result Value Ref Range   Prothrombin Time 13.5 11.4 - 15.2 seconds   INR 1.04     Comment: Performed at [facility], [place] [address], [place], [place] [address]  I-Stat beta hCG blood, ED     Status: None   Collection Time: 03/27/18  1:23 AM  Result Value Ref Range   I-stat hCG, quantitative <5.0 <5 mIU/mL   Comment 3            Comment:   GEST. AGE      CONC.  (mIU/mL)   <=1 WEEK        5 - 50     2 WEEKS       50 - 500     3 WEEKS       100 - 10,000     4 WEEKS     1,000 - 30,000        FEMALE AND NON-PREGNANT FEMALE:     LESS THAN 5 mIU/mL   I-Stat CG4 Lactic Acid, ED     Status: None   Collection Time: 03/27/18  1:25 AM  Result Value Ref Range   Lactic Acid, Venous 1.79 0.5 -  1.9 mmol/L  Urinalysis, Routine w reflex microscopic     Status: Abnormal   Collection Time: 03/27/18  4:24 AM   Result Value Ref Range   Color, Urine YELLOW YELLOW   APPearance CLEAR CLEAR   Specific Gravity, Urine 1.017 1.005 - 1.030   pH 5.0 5.0 - 8.0   Glucose, UA NEGATIVE NEGATIVE mg/dL   Hgb urine dipstick SMALL (A) NEGATIVE   Bilirubin Urine NEGATIVE NEGATIVE   Ketones, ur NEGATIVE NEGATIVE mg/dL   Protein, ur NEGATIVE NEGATIVE mg/dL   Nitrite NEGATIVE NEGATIVE   Leukocytes, UA MODERATE (A) NEGATIVE   RBC / HPF 0-5 0 - 5 RBC/hpf   WBC, UA 6-30 0 - 5 WBC/hpf   Bacteria, UA NONE SEEN NONE SEEN   Squamous Epithelial / LPF 0-5 (A) NONE SEEN   Hyaline Casts, UA PRESENT     Comment: Performed at [facility], [place] [address] [place], [place] [address]   Dg Chest 2 View  Result Date: 03/27/2018 CLINICAL DATA:  Fever and low blood pressure. Altered mental status. EXAM: CHEST - 2 VIEW COMPARISON:  11/01/2013 FINDINGS: The heart size and mediastinal contours are within normal limits. Both lungs are clear. The visualized skeletal structures are unremarkable. IMPRESSION: No active cardiopulmonary disease. Electronically Signed   By: Lucienne Capers M.D.   On: 03/27/2018 02:05    Review Of Systems Constitutional: Positive fever, chills, weight loss. Eyes: No vision change, wears glasses. No discharge or pain. Ears: No hearing loss, No tinnitus. Respiratory: No asthma, COPD, pneumonias. No shortness of breath. No hemoptysis. Cardiovascular: No chest pain, palpitation, leg edema. Gastrointestinal: Positive nausea, vomiting, no diarrhea, constipation. No GI bleed. No hepatitis. Genitourinary: No dysuria, hematuria, kidney stone. No incontinance. Neurological: Positive headache, no stroke, no seizures.  Psychiatry: No psych facility admission for anxiety, depression, suicide. No detox. Skin: No rash. Musculoskeletal: Positive joint pain, no fibromyalgia. No neck pain, back pain. Lymphadenopathy: No lymphadenopathy. Hematology: Positive anemia or easy bruising.   Blood pressure  (!) 150/55, pulse (!) 56, temperature (!) 103 F (39.4 C), temperature source Oral, resp. rate 18, height 5' (1.524 m), weight 50.8 kg (112 lb), SpO2 98 %. Body mass index is 21.87 kg/m. General appearance: alert, cooperative, appears stated age and no distress Head: Normocephalic, atraumatic. Eyes: Brown eyes, Pale pink conjunctiva, corneas clear. PERRL, EOM's intact. Neck: No adenopathy, no carotid bruit, no JVD, supple, symmetrical, trachea midline and thyroid not enlarged. Resp: Clear to auscultation bilaterally. Cardio: Regular rate and rhythm, S1, S2 normal, II/VI systolic murmur, no click, rub or gallop GI: Soft, non-tender; bowel sounds normal; no organomegaly. Extremities: No edema, cyanosis or clubbing. Skin: Warm and dry.  Neurologic: Alert and oriented X 3, normal strength. Normal coordination and gait.  Assessment/Plan Fever Urosepsis, acute Type 2 DM Hypertension Hypothyroidism Acute on chronic adrenal insufficiency Anemia ofchronic disease Moderate protein calorie malnutrition CKD, III Hyperkalemia  Admit IV fluids IV antibiotics Monitor renal function  Birdie Riddle, MD  03/27/2018, 6:41 AM

## 2018-03-27 NOTE — ED Provider Notes (Signed)
[facility] DEPT Provider Note   CSN: [id number] Arrival date & time: 03/27/18  0040     History   Chief Complaint Chief Complaint  Patient presents with  . Fever    HPI [name] is a 57 y.o. female.  The history is provided by the patient and medical records.  Fever      57 y.o. F with hx of DM, HTN, thyroid disease, adrenal insufficiency, presenting to the ED with fever.  Family states patient seemed ok most of the day, she ate dinner and then started vomiting.  Family reports she threw up several times and went to bed.  They went to check on her and noticed she seemed to be shaking/shivering.  No seizure activity.  States she has been a little drowsy/lethargic appearing since then.  No recent illness.  No changes in medications.  No falls or head trauma.  Family did check her blood sugar at home, it was 227.  Past Medical History:  Diagnosis Date  . Diabetes mellitus without complication ([place])   . Hypertension   . Thyroid disease     Patient Active Problem List   Diagnosis Date Noted  . Dehydration 12/09/2017  . Abdominal pain, bilateral lower quadrant 12/09/2017  . Hypoglycemia 07/08/2017  . Hypoglycemia associated with diabetes ([place]) 05/10/2017  . Malnutrition of moderate degree 05/10/2017    Past Surgical History:  Procedure Laterality Date  . CESAREAN SECTION       OB History   None      Home Medications    Prior to Admission medications   Medication Sig Start Date End Date Taking? Authorizing Provider  amLODipine (NORVASC) 5 MG tablet Take 10 mg by mouth daily.     [provider]  amoxicillin-clavulanate (AUGMENTIN) 875-125 MG tablet Take 1 tablet by mouth every 12 (twelve) hours. 12/11/17   Charolette Forward, MD  aspirin 81 MG chewable tablet Chew 81 mg by mouth daily.    [provider]  hydrocortisone (CORTEF) 5 MG tablet Take 2.5 mg by mouth 2 (two) times daily.     [provider]    levothyroxine (SYNTHROID, LEVOTHROID) 125 MCG tablet Take 125 mcg by mouth daily before breakfast.    [provider]  Multiple Vitamin (MULTIVITAMIN WITH MINERALS) TABS tablet Take 1 tablet by mouth daily.    [provider]  pantoprazole (PROTONIX) 40 MG tablet Take 40 mg by mouth daily.    [provider]    Family History Family History  Problem Relation Age of Onset  . Diabetes Mother   . Hyperlipidemia Father   . Hypertension Father     Social History Social History   Tobacco Use  . Smoking status: Never Smoker  . Smokeless tobacco: Never Used  Substance Use Topics  . Alcohol use: No  . Drug use: No     Allergies   Percocet [oxycodone-acetaminophen]   Review of Systems Review of Systems  Constitutional: Positive for fever.  All other systems reviewed and are negative.    Physical Exam Updated Vital Signs BP 100/81 (BP Location: Right Arm)   Pulse 88   Temp (!) 103 F (39.4 C) (Oral)   Resp 18   Ht 5' (1.524 m)   Wt 50.8 kg (112 lb)   SpO2 100%   BMI 21.87 kg/m   Physical Exam  Constitutional: She appears well-developed and well-nourished.  Awake, appears somewhat somnolent, moaning during exam  HENT:  Head: Normocephalic and atraumatic.  Mouth/Throat: Oropharynx is clear and moist.  Eyes: Pupils are equal, round, and reactive to light. Conjunctivae and EOM are normal.  Neck: Normal range of motion.  Cardiovascular: Normal rate, regular rhythm and normal heart sounds.  Pulmonary/Chest: Effort normal and breath sounds normal. No stridor. No respiratory distress.  Abdominal: Soft. Bowel sounds are normal. There is no tenderness. There is no rebound.  Musculoskeletal: Normal range of motion.  Neurological:  Awake, a little drowsy appearing, moaning during exam, following commands, moving arms and legs when prompted  Skin: Skin is warm and dry.  Psychiatric: She has a normal mood and affect.  Nursing note and vitals  reviewed.    ED Treatments / Results  Labs (all labs ordered are listed, but only abnormal results are displayed) Labs Reviewed  COMPREHENSIVE METABOLIC PANEL - Abnormal; Notable for the following components:      Result Value   Sodium 127 (*)    Potassium 5.7 (*)    Chloride 97 (*)    CO2 21 (*)    Glucose, Bld 185 (*)    Creatinine, Ser 2.25 (*)    Calcium 8.4 (*)    Total Protein 5.3 (*)    AST 237 (*)    ALT 85 (*)    GFR calc non Af Amer 23 (*)    GFR calc Af Amer 26 (*)    All other components within normal limits  CBC WITH DIFFERENTIAL/PLATELET - Abnormal; Notable for the following components:   RBC 3.62 (*)    Hemoglobin 10.3 (*)    HCT 30.0 (*)    All other components within normal limits  URINALYSIS, ROUTINE W REFLEX MICROSCOPIC - Abnormal; Notable for the following components:   Hgb urine dipstick SMALL (*)    Leukocytes, UA MODERATE (*)    Squamous Epithelial / LPF 0-5 (*)    All other components within normal limits  CBG MONITORING, ED - Abnormal; Notable for the following components:   Glucose-Capillary 187 (*)    All other components within normal limits  CULTURE, BLOOD (ROUTINE X 2)  CULTURE, BLOOD (ROUTINE X 2)  PROTIME-INR  I-STAT CG4 LACTIC ACID, ED  I-STAT BETA HCG BLOOD, ED ([facility], [facility], [facility] ONLY)    EKG EKG Interpretation  Date/Time:  Monday March 27 2018 01:05:45 EDT Ventricular Rate:  87 PR Interval:    QRS Duration: 127 QT Interval:  360 QTC Calculation: 433 R Axis:   34 Text Interpretation:  Sinus rhythm Nonspecific ST and T wave abnormality Baseline wander in lead(s) III No significant change since last tracing Confirmed by Orpah Greek [phone]) on 03/27/2018 1:25:32 AM   Radiology Dg Chest 2 View  Result Date: 03/27/2018 CLINICAL DATA:  Fever and low blood pressure. Altered mental status. EXAM: CHEST - 2 VIEW COMPARISON:  11/01/2013 FINDINGS: The heart size and mediastinal contours are within normal limits. Both lungs are clear.  The visualized skeletal structures are unremarkable. IMPRESSION: No active cardiopulmonary disease. Electronically Signed   By: Lucienne Capers M.D.   On: 03/27/2018 02:05    Procedures Procedures (including critical care time)  Medications Ordered in ED Medications  sodium chloride 0.9 % bolus 1,000 mL (0 mLs Intravenous Stopped 03/27/18 0232)    And  sodium chloride 0.9 % bolus 500 mL (0 mLs Intravenous Stopped 03/27/18 0218)    And  sodium chloride 0.9 % bolus 250 mL (0 mLs Intravenous Stopped 03/27/18 0232)  hydrocortisone sodium succinate (SOLU-CORTEF) 100 MG injection 100 mg (100 mg Intravenous  Given 03/27/18 0136)  acetaminophen (TYLENOL) tablet 1,000 mg (1,000 mg Oral Given 03/27/18 0142)  vancomycin (VANCOCIN) 1,250 mg in sodium chloride 0.9 % 250 mL IVPB (0 mg Intravenous Stopped 03/27/18 0318)  piperacillin-tazobactam (ZOSYN) IVPB 3.375 g (0 g Intravenous Stopped 03/27/18 0218)     Initial Impression / Assessment and Plan / ED Course  I have reviewed the triage vital signs and the nursing notes.  Pertinent labs & imaging results that were available during my care of the patient were reviewed by me and considered in my medical decision making (see chart for details).  57 year old female presenting to the ED with fever.  Temp 103 here, she does appear somewhat somnolent but does arouse to verbal and tactile stimuli.  She is able to follow commands when prompted.  She is moving her arms and legs well.  Per family, she seemed okay most of the day but after several episodes of vomiting while on a dinner she seemed to go downhill.  Protocol initiated as she is febrile and hypotensive.  Broad spectrum abx ordered.  She does have hx of adrenal insufficiency, will give IV hydrocortisone.  Labs as above-- evidence of AKI with electrolyte abnormalities.  Creatinine 2.25, was 1.36 in December.   Normal lactate.  UA with moderate leuks, 6-30 WBCs.  CXR clear.  Patient much improved after IVF and dose of  hydrocortisone-- she is up and ambulatory to restroom.  She may have had mild adrenal crisis secondary to fever, nausea/vomiting.  Feel she will require admission for ongoing IVF.  Discussed with Dr. Doylene Canard-- he will come admit for ongoing care.  Final Clinical Impressions(s) / ED Diagnoses   Final diagnoses:  Adrenal crisis ([place])  AKI (acute kidney injury) ([place])  Nausea and vomiting, intractability of vomiting not specified, unspecified vomiting type    ED Discharge Orders    None       Larene Pickett, PA-C 03/27/18 0315    Orpah Greek, MD 03/27/18 [phone]

## 2018-03-27 NOTE — Progress Notes (Signed)
Pharmacy Antibiotic Note  [name] is a 57 y.o. female admitted on 03/27/2018 with sepsis.  Pharmacy has been consulted for vancomycin and zosyn dosing. Per MD note urosepsis. WBC WNL, T max 103  SCr 2.25, hx CKD 3. . 4/8 UA no bacteria, nitrate negative.  Pt had vomiting last night.   Plan: Zosyn 3.375g IV q8h (4 hour infusion).  May need to decrease dose if CrCl < 20 ml/min Vancomycin 1250 mg loading dose given 4/8 at 0138 am then vancomycin 750 mg IV q48 for est AUC ~ 480 using SCr 2.25 and TBW/TBW. Start 4/9 at 2200 Daily SCr while on vanc/zosyn F/u renal function, wbc, temp, culture data Vancomycin levels as needed  Height: 5\' 4"  (162.6 cm) Weight: 115 lb (52.2 kg) IBW/kg (Calculated) : 54.7  Temp (24hrs), Avg:99.5 F (37.5 C), Min:97.8 F (36.6 C), Max:103 F (39.4 C)  Recent Labs  Lab 03/27/18 0118 03/27/18 0125  WBC 7.2  --   CREATININE 2.25*  --   LATICACIDVEN  --  1.79    Estimated Creatinine Clearance: 22.5 mL/min (A) (by C-G formula based on SCr of 2.25 mg/dL (H)).    Allergies  Allergen Reactions  . Percocet [Oxycodone-Acetaminophen] Rash    Antimicrobials this admission: 4/8 zosyn>> 4/8 vanc>> Dose adjustments this admission:   Microbiology results: 4/8 BCx2>>  Thank you for allowing pharmacy to be a part of this patient's care.  Eudelia Bunch, Pharm.D. [phone] 03/27/2018 12:06 PM

## 2018-03-27 NOTE — ED Notes (Signed)
ED TO INPATIENT HANDOFF REPORT  Name/Age/Gender [name] 57 y.o. female  Code Status    Code Status Orders  (From admission, onward)        Start     Ordered   03/27/18 0633  Full code  Continuous     03/27/18 0634    Code Status History    Date Active Date Inactive Code Status Order ID Comments User Context   12/09/2017 1949 12/11/2017 1710 Full Code [id number]  Dixie Dials, MD Inpatient   07/08/2017 0347 07/09/2017 1655 Full Code [id number]  Dixie Dials, MD ED   05/10/2017 0110 05/11/2017 1615 Full Code [id number]  Dixie Dials, MD ED      Home/SNF/Other Home  Chief Complaint HYPERGLYCEMIA  Level of Care/Admitting Diagnosis ED Disposition    ED Disposition Condition Comment   [facility] Area: [facility] [[id number]]  Level of Care: Telemetry [5]  Admit to tele based on following criteria: Eval of Syncope  Diagnosis: Acute adrenal insufficiency [facility]) [[id number]]  Admitting Physician: Dixie Dials [[id number]]  Attending Physician: Dixie Dials [[id number]]  Estimated length of stay: past midnight tomorrow  Certification:: I certify this patient will need inpatient services for at least 2 midnights  PT Class (Do Not Modify): Inpatient [101]  PT Acc Code (Do Not Modify): Private [1]       Medical History Past Medical History:  Diagnosis Date  . Diabetes mellitus without complication ([place])   . Hypertension   . Thyroid disease     Allergies Allergies  Allergen Reactions  . Percocet [Oxycodone-Acetaminophen] Rash    IV Location/Drains/Wounds Patient Lines/Drains/Airways Status   Active Line/Drains/Airways    Name:   Placement date:   Placement time:   Site:   Days:   Peripheral IV 03/27/18 Left;Anterior Forearm   03/27/18    0115    Forearm   less than 1   Peripheral IV 03/27/18 Right;Anterior Forearm   03/27/18    0120    Forearm   less than 1          Labs/Imaging Results for orders placed or performed during the hospital encounter  of 03/27/18 (from the past 48 hour(s))  CBG monitoring, ED     Status: Abnormal   Collection Time: 03/27/18 12:47 AM  Result Value Ref Range   Glucose-Capillary 187 (H) 65 - 99 mg/dL  Comprehensive metabolic panel     Status: Abnormal   Collection Time: 03/27/18  1:18 AM  Result Value Ref Range   Sodium 127 (L) 135 - 145 mmol/L    Comment: Performed at [facility], [place] [address], [place], [place] [address]   Potassium 5.7 (H) 3.5 - 5.1 mmol/L    Comment: SLIGHT HEMOLYSIS LIPEMIC SPECIMEN, RESULTS MAY BE AFFECTED. NOTIFIED Sofie Rower RN AT [facility] ON 03/27/18 BY MOHAMED,A Performed at [facility], [place] [address], [place], [place] [address]    Chloride 97 (L) 101 - 111 mmol/L    Comment: Performed at [facility], [place] [place], [place] [address]   CO2 21 (L) 22 - 32 mmol/L    Comment: Performed at [facility], [place] [address] [place], [place] [address]   Glucose, Bld 185 (H) 65 - 99 mg/dL    Comment: Performed at [facility], [place] [address] [place], [place] [address]   BUN 18 6 - 20 mg/dL    Comment: Performed at [facility], [address] [place], [place] [address]   Creatinine, Ser 2.25 (H) 0.44 - 1.00 mg/dL    Comment: Performed at [facility], [place] [address] [place], [place] [address]   Calcium 8.4 (L) 8.9 - 10.3 mg/dL    Comment: Performed at [facility], [place] [address] [place], [place] [address]   Total Protein 5.3 (L) 6.5 - 8.1 g/dL    Comment: Performed at [facility], [place] [address], [place], [place] [address]   Albumin 3.7 3.5 - 5.0 g/dL    Comment: LIPEMIC SPECIMEN, RESULTS MAY BE AFFECTED. Performed at [facility], [place] [address], [place], [place] [address]    AST 237 (H) 15 - 41 U/L    Comment: POST-ULTRACENTRIFUGATION Performed at [facility],  [place] [address], [place], [place] [address]    ALT 85 (H) 14 - 54 U/L    Comment: POST-ULTRACENTRIFUGATION Performed at [place] [address] [place], [place] [address]    Alkaline Phosphatase 69 38 - 126 U/L   Total Bilirubin 0.8 0.3 - 1.2 mg/dL   GFR calc non Af Amer 23 (L) >60 mL/min   GFR calc Af Amer 26 (L) >60 mL/min    Comment: (NOTE) The eGFR has been calculated using the CKD EPI equation. This calculation has not been validated in all clinical situations. eGFR's persistently <60 mL/min signify possible Chronic Kidney Disease.    Anion gap 9 5 - 15    Comment: Performed at [facility], [place] [address] [place], [place] [address]  CBC with Differential     Status: Abnormal   Collection Time: 03/27/18  1:18 AM  Result Value Ref Range   WBC 7.2 4.0 - 10.5 K/uL    Comment: WHITE COUNT CONFIRMED ON SMEAR   RBC 3.62 (L) 3.87 - 5.11 MIL/uL   Hemoglobin 10.3 (L) 12.0 - 15.0 g/dL    Comment: CORRECTED FOR INTERFERING SUBSTANCE   HCT 30.0 (L) 36.0 - 46.0 %   MCV 82.9 78.0 - 100.0 fL   MCH 28.5 26.0 - 34.0 pg    Comment: CORRECTED FOR INTERFERING SUBSTANCE   MCHC 34.3 30.0 - 36.0 g/dL    Comment: CORRECTED FOR INTERFERING SUBSTANCE   RDW 14.7 11.5 - 15.5 %   Platelets 238 150 - 400 K/uL    Comment: REPEATED TO VERIFY SPECIMEN CHECKED FOR CLOTS PLATELET COUNT CONFIRMED BY SMEAR    Neutrophils Relative % 78 %   Lymphocytes Relative 15 %   Monocytes Relative 4 %   Eosinophils Relative 3 %   Basophils Relative 0 %   Neutro Abs 5.6 1.7 - 7.7 K/uL   Lymphs Abs 1.1 0.7 - 4.0 K/uL   Monocytes Absolute 0.3 0.1 - 1.0 K/uL   Eosinophils Absolute 0.2 0.0 - 0.7 K/uL   Basophils Absolute 0.0 0.0 - 0.1 K/uL   Smear Review MORPHOLOGY UNREMARKABLE     Comment: Performed at [facility], [place] [address] [place] [address] [place], [place] [address]  Protime-INR     Status: None   Collection Time: 03/27/18  1:18 AM  Result Value Ref Range   Prothrombin Time  13.5 11.4 - 15.2 seconds   INR 1.04     Comment: Performed at [facility], [place] [address], [place],  [address]  I-Stat beta hCG blood, ED     Status: None   Collection Time: 03/27/18  1:23 AM  Result Value Ref Range  I-stat hCG, quantitative <5.0 <5 mIU/mL   Comment 3            Comment:   GEST. AGE      CONC.  (mIU/mL)   <=1 WEEK        5 - 50     2 WEEKS       50 - 500     3 WEEKS       100 - 10,000     4 WEEKS     1,000 - 30,000        FEMALE AND NON-PREGNANT FEMALE:     LESS THAN 5 mIU/mL   I-Stat CG4 Lactic Acid, ED     Status: None   Collection Time: 03/27/18  1:25 AM  Result Value Ref Range   Lactic Acid, Venous 1.79 0.5 - 1.9 mmol/L  Urinalysis, Routine w reflex microscopic     Status: Abnormal   Collection Time: 03/27/18  4:24 AM  Result Value Ref Range   Color, Urine YELLOW YELLOW   APPearance CLEAR CLEAR   Specific Gravity, Urine 1.017 1.005 - 1.030   pH 5.0 5.0 - 8.0   Glucose, UA NEGATIVE NEGATIVE mg/dL   Hgb urine dipstick SMALL (A) NEGATIVE   Bilirubin Urine NEGATIVE NEGATIVE   Ketones, ur NEGATIVE NEGATIVE mg/dL   Protein, ur NEGATIVE NEGATIVE mg/dL   Nitrite NEGATIVE NEGATIVE   Leukocytes, UA MODERATE (A) NEGATIVE   RBC / HPF 0-5 0 - 5 RBC/hpf   WBC, UA 6-30 0 - 5 WBC/hpf   Bacteria, UA NONE SEEN NONE SEEN   Squamous Epithelial / LPF 0-5 (A) NONE SEEN   Hyaline Casts, UA PRESENT     Comment: Performed at [facility], [place] [address], [place], [place] [address]   Dg Chest 2 View  Result Date: 03/27/2018 CLINICAL DATA:  Fever and low blood pressure. Altered mental status. EXAM: CHEST - 2 VIEW COMPARISON:  11/01/2013 FINDINGS: The heart size and mediastinal contours are within normal limits. Both lungs are clear. The visualized skeletal structures are unremarkable. IMPRESSION: No active cardiopulmonary disease. Electronically Signed   By: Lucienne Capers M.D.   On: 03/27/2018 02:05    Pending Labs Unresulted  Labs (From admission, onward)   Start     Ordered   03/28/18 8416  Basic metabolic panel  Tomorrow morning,   R     03/27/18 0634   03/28/18 0500  CBC  Tomorrow morning,   R     03/27/18 0634   03/27/18 0050  Culture, blood (Routine x 2)  BLOOD CULTURE X 2,   STAT     03/27/18 0049      Vitals/Pain Today's Vitals   03/27/18 0500 03/27/18 0530 03/27/18 0600 03/27/18 0630  BP: (!) 150/45 (!) 140/40 (!) 142/47 (!) 150/55  Pulse: 64 61 (!) 57 (!) 56  Resp: '17 18 18 18  '$ Temp:      TempSrc:      SpO2: 97% 96% 96% 98%  Weight:      Height:      PainSc:        Isolation Precautions No active isolations  Medications Medications  heparin injection 5,000 Units (has no administration in time range)  0.9 %  sodium chloride infusion (has no administration in time range)  sodium chloride 0.9 % bolus 1,000 mL (0 mLs Intravenous Stopped 03/27/18 0232)    And  sodium chloride 0.9 % bolus 500 mL (0 mLs Intravenous Stopped 03/27/18  7493)    And  sodium chloride 0.9 % bolus 250 mL (0 mLs Intravenous Stopped 03/27/18 0232)  hydrocortisone sodium succinate (SOLU-CORTEF) 100 MG injection 100 mg (100 mg Intravenous Given 03/27/18 0136)  acetaminophen (TYLENOL) tablet 1,000 mg (1,000 mg Oral Given 03/27/18 0142)  vancomycin (VANCOCIN) 1,250 mg in sodium chloride 0.9 % 250 mL IVPB (0 mg Intravenous Stopped 03/27/18 0318)  piperacillin-tazobactam (ZOSYN) IVPB 3.375 g (0 g Intravenous Stopped 03/27/18 0218)    Mobility walks

## 2018-03-28 LAB — BASIC METABOLIC PANEL
Anion gap: 6 (ref 5–15)
BUN: 21 mg/dL — AB (ref 6–20)
CHLORIDE: 111 mmol/L (ref 101–111)
CO2: 19 mmol/L — AB (ref 22–32)
CREATININE: 2 mg/dL — AB (ref 0.44–1.00)
Calcium: 8.3 mg/dL — ABNORMAL LOW (ref 8.9–10.3)
GFR calc Af Amer: 31 mL/min — ABNORMAL LOW (ref >60)
GFR calc non Af Amer: 26 mL/min — ABNORMAL LOW (ref >60)
GLUCOSE: 286 mg/dL — AB (ref 65–99)
Potassium: 4.3 mmol/L (ref 3.5–5.1)
Sodium: 136 mmol/L (ref 135–145)

## 2018-03-28 LAB — TSH: TSH: 6.165 u[IU]/mL — ABNORMAL HIGH (ref 0.350–4.500)

## 2018-03-28 LAB — HEMOGLOBIN A1C
Hgb A1c MFr Bld: 9.2 % — ABNORMAL HIGH (ref 4.8–5.6)
Mean Plasma Glucose: 217.34 mg/dL

## 2018-03-28 LAB — CBC
HCT: 26.4 % — ABNORMAL LOW (ref 36.0–46.0)
Hemoglobin: 9.3 g/dL — ABNORMAL LOW (ref 12.0–15.0)
MCH: 28.6 pg (ref 26.0–34.0)
MCHC: 35.2 g/dL (ref 30.0–36.0)
MCV: 81.2 fL (ref 78.0–100.0)
PLATELETS: 152 10*3/uL (ref 150–400)
RBC: 3.25 MIL/uL — ABNORMAL LOW (ref 3.87–5.11)
RDW: 13.2 % (ref 11.5–15.5)
WBC: 9.2 10*3/uL (ref 4.0–10.5)

## 2018-03-28 LAB — GLUCOSE, CAPILLARY
GLUCOSE-CAPILLARY: 226 mg/dL — AB (ref 65–99)
Glucose-Capillary: 244 mg/dL — ABNORMAL HIGH (ref 65–99)
Glucose-Capillary: 343 mg/dL — ABNORMAL HIGH (ref 65–99)

## 2018-03-28 MED ORDER — HYDRALAZINE HCL 25 MG PO TABS
25.0000 mg | ORAL_TABLET | Freq: Four times a day (QID) | ORAL | Status: DC | PRN
  Administered 2018-03-28 – 2018-03-29 (×3): 25 mg via ORAL
  Filled 2018-03-28 (×3): qty 1

## 2018-03-28 MED ORDER — INSULIN ASPART 100 UNIT/ML ~~LOC~~ SOLN
0.0000 [IU] | Freq: Three times a day (TID) | SUBCUTANEOUS | Status: DC
  Administered 2018-03-28 (×2): 3 [IU] via SUBCUTANEOUS

## 2018-03-28 NOTE — Progress Notes (Signed)
Ref: Suzanne Dials, MD   Subjective:  Elevated blood sugar levels with 100 mg. Hydrocortisone use on admission. She is well controlled by diet at home. Previous A1C was 5.3. Current Hgb A1C is 9.2 ( Mean plasma glucose 217.34 mg.).  TSH is mildly elevated at 6.165 uIU with 125 mcg levothyroxine daily dose.  Creatinine slightly low at 2.0.  Afebrile, awake and alert. Elevated systolic blood pressure.  Hyperkalemia improved.  Objective:  Vital Signs in the last 24 hours: Temp:  [98 F (36.7 C)-98.3 F (36.8 C)] 98 F (36.7 C) (04/09 1231) Pulse Rate:  [59-78] 64 (04/09 1742) Cardiac Rhythm: Normal sinus rhythm (04/09 0700) Resp:  [19-20] 19 (04/09 1231) BP: (155-190)/(51-73) 169/68 (04/09 1742) SpO2:  [99 %-100 %] 99 % (04/09 1620) Weight:  [52.5 kg (115 lb 11.9 oz)] 52.5 kg (115 lb 11.9 oz) (04/09 0458)  Physical Exam: BP Readings from Last 1 Encounters:  03/28/18 (!) 169/68     Wt Readings from Last 1 Encounters:  03/28/18 52.5 kg (115 lb 11.9 oz)    Weight change: 1.361 kg (3 lb) Body mass index is 19.87 kg/m. HEENT: /AT, Eyes-Brown, PERL, EOMI, Conjunctiva-Pale pink, Sclera-Non-icteric Neck: No JVD, No bruit, Trachea midline. Lungs:  Clear, Bilateral. Cardiac:  Regular rhythm, normal S1 and S2, no S3. II/VI systolic murmur. Abdomen:  Soft, non-tender. BS present. Extremities:  No edema present. No cyanosis. No clubbing. CNS: AxOx3, Cranial nerves grossly intact, moves all 4 extremities.  Skin: Warm and dry.   Intake/Output from previous day: 04/08 0701 - 04/09 0700 In: 2593.8 [P.O.:720; I.V.:1723.8; IV Piggyback:150] Out: 1450 [Urine:1450]    Lab Results: BMET    Component Value Date/Time   NA 136 03/28/2018 0452   NA 127 (L) 03/27/2018 0118   NA 134 (L) 12/11/2017 0631   K 4.3 03/28/2018 0452   K 5.7 (H) 03/27/2018 0118   K 4.7 12/11/2017 0631   CL 111 03/28/2018 0452   CL 97 (L) 03/27/2018 0118   CL 108 12/11/2017 0631   CO2 19 (L) 03/28/2018 0452    CO2 21 (L) 03/27/2018 0118   CO2 21 (L) 12/11/2017 0631   GLUCOSE 286 (H) 03/28/2018 0452   GLUCOSE 185 (H) 03/27/2018 0118   GLUCOSE 110 (H) 12/11/2017 0631   BUN 21 (H) 03/28/2018 0452   BUN 18 03/27/2018 0118   BUN 11 12/11/2017 0631   CREATININE 2.00 (H) 03/28/2018 0452   CREATININE 2.25 (H) 03/27/2018 0118   CREATININE 1.36 (H) 12/11/2017 0631   CALCIUM 8.3 (L) 03/28/2018 0452   CALCIUM 8.4 (L) 03/27/2018 0118   CALCIUM 8.5 (L) 12/11/2017 0631   GFRNONAA 26 (L) 03/28/2018 0452   GFRNONAA 23 (L) 03/27/2018 0118   GFRNONAA 42 (L) 12/11/2017 0631   GFRAA 31 (L) 03/28/2018 0452   GFRAA 26 (L) 03/27/2018 0118   GFRAA 49 (L) 12/11/2017 0631   CBC    Component Value Date/Time   WBC 9.2 03/28/2018 0452   RBC 3.25 (L) 03/28/2018 0452   HGB 9.3 (L) 03/28/2018 0452   HCT 26.4 (L) 03/28/2018 0452   PLT 152 03/28/2018 0452   MCV 81.2 03/28/2018 0452   MCH 28.6 03/28/2018 0452   MCHC 35.2 03/28/2018 0452   RDW 13.2 03/28/2018 0452   LYMPHSABS 1.1 03/27/2018 0118   MONOABS 0.3 03/27/2018 0118   EOSABS 0.2 03/27/2018 0118   BASOSABS 0.0 03/27/2018 0118   HEPATIC Function Panel Recent Labs    07/08/17 0115 12/05/17 1341 03/27/18 0118  PROT 7.1 7.2 5.3*   HEMOGLOBIN A1C No components found for: HGA1C,  MPG CARDIAC ENZYMES Lab Results  Component Value Date   CKTOTAL 147 06/12/2011   CKMB 3.2 06/12/2011   TROPONINI  05/13/2011    <0.30        Due to the release kinetics of cTnI, a negative result within the first hours of the onset of symptoms does not rule out myocardial infarction with certainty. If myocardial infarction is still suspected, repeat the test at appropriate intervals. **Please note change in reference range.**   TROPONINI  05/12/2011    <0.30        Due to the release kinetics of cTnI, a negative result within the first hours of the onset of symptoms does not rule out myocardial infarction with certainty. If myocardial infarction is still  suspected, repeat the test at appropriate intervals. **Please note change in reference range.**   TROPONINI  05/12/2011    <0.30        Due to the release kinetics of cTnI, a negative result within the first hours of the onset of symptoms does not rule out myocardial infarction with certainty. If myocardial infarction is still suspected, repeat the test at appropriate intervals. **Please note change in reference range.**   BNP No results for input(s): PROBNP in the last 8760 hours. TSH Recent Labs    05/10/17 0506 03/28/18 0452  TSH 3.193 6.165*   CHOLESTEROL No results for input(s): CHOL in the last 8760 hours.  Scheduled Meds: . amLODipine  10 mg Oral Daily  . aspirin  81 mg Oral Daily  . heparin  5,000 Units Subcutaneous Q8H  . hydrocortisone  10 mg Oral BID  . insulin aspart  0-9 Units Subcutaneous TID WC  . levothyroxine  125 mcg Oral QAC breakfast  . multivitamin with minerals  1 tablet Oral Daily  . pantoprazole  40 mg Oral Daily   Continuous Infusions: . sodium chloride 50 mL/hr at 03/28/18 0946  . piperacillin-tazobactam (ZOSYN)  IV 3.375 g (03/28/18 1742)  . vancomycin     PRN Meds:.acetaminophen, hydrALAZINE  Assessment/Plan: Acute urosepsis Possible viral syndrome Type 2 DM, uncontrolled Hypertension Hypothyroidism Acute on chronic adrenal insufficiency Anemia of chronic disease Moderate protein calorie malnutrition CKD, III Hyperkalemia-resolved  Decrease IV fluids to 50 cc/hr. Add Hydralazine for systolic hypertension control. Add insulin coverage. Consider Oral hypoglycemic agent when patient is stable Increase levothyroxine dose. Increase activity as tolerated.   LOS: 1 day    Suzanne Dials  MD  03/28/2018, 6:02 PM

## 2018-03-28 NOTE — Progress Notes (Signed)
Inpatient Diabetes Program Recommendations  AACE/ADA: New Consensus Statement on Inpatient Glycemic Control (2015)  Target Ranges:  Prepandial:   less than 140 mg/dL      Peak postprandial:   less than 180 mg/dL (1-2 hours)      Critically ill patients:  140 - 180 mg/dL   Lab Results  Component Value Date   GLUCAP 244 (H) 03/28/2018   HGBA1C 9.2 (H) 03/28/2018    Review of Glycemic ControlResults for [name], [name] (MRN [id number]) as of 03/28/2018 11:47  Ref. Range 03/27/2018 00:47 03/28/2018 11:43  Glucose-Capillary Latest Ref Range: 65 - 99 mg/dL 187 (H) 244 (H)    Diabetes history: DM Outpatient Diabetes medications: None Current orders for Inpatient glycemic control:  Novolog sensitive tid with  meals  Inpatient Diabetes Program Recommendations:     Note elevated A1C?  Is this new diagnosis of DM? Agree with current orders.  Will follow.   Thanks,  Adah Perl, RN, BC-ADM Inpatient Diabetes Coordinator Pager [phone] (8a-5p)

## 2018-03-29 LAB — BASIC METABOLIC PANEL
Anion gap: 7 (ref 5–15)
BUN: 17 mg/dL (ref 6–20)
CALCIUM: 8.5 mg/dL — AB (ref 8.9–10.3)
CO2: 20 mmol/L — AB (ref 22–32)
CREATININE: 1.81 mg/dL — AB (ref 0.44–1.00)
Chloride: 108 mmol/L (ref 101–111)
GFR calc non Af Amer: 30 mL/min — ABNORMAL LOW (ref >60)
GFR, EST AFRICAN AMERICAN: 34 mL/min — AB (ref >60)
GLUCOSE: 293 mg/dL — AB (ref 65–99)
Potassium: 4 mmol/L (ref 3.5–5.1)
Sodium: 135 mmol/L (ref 135–145)

## 2018-03-29 LAB — GLUCOSE, CAPILLARY
GLUCOSE-CAPILLARY: 161 mg/dL — AB (ref 65–99)
Glucose-Capillary: 266 mg/dL — ABNORMAL HIGH (ref 65–99)
Glucose-Capillary: 92 mg/dL (ref 65–99)
Glucose-Capillary: 158 mg/dL — ABNORMAL HIGH (ref 65–99)

## 2018-03-29 MED ORDER — LEVOTHYROXINE SODIUM 50 MCG PO TABS
150.0000 ug | ORAL_TABLET | Freq: Every day | ORAL | Status: DC
  Administered 2018-03-30 – 2018-03-31 (×2): 150 ug via ORAL
  Filled 2018-03-29 (×2): qty 1

## 2018-03-29 MED ORDER — GLIMEPIRIDE 2 MG PO TABS
2.0000 mg | ORAL_TABLET | Freq: Every day | ORAL | Status: DC
  Administered 2018-03-29 – 2018-03-31 (×3): 2 mg via ORAL
  Filled 2018-03-29 (×3): qty 1

## 2018-03-29 MED ORDER — INSULIN ASPART 100 UNIT/ML ~~LOC~~ SOLN
0.0000 [IU] | Freq: Three times a day (TID) | SUBCUTANEOUS | Status: DC
  Administered 2018-03-29: 3 [IU] via SUBCUTANEOUS
  Administered 2018-03-29: 8 [IU] via SUBCUTANEOUS
  Administered 2018-03-30: 3 [IU] via SUBCUTANEOUS
  Administered 2018-03-30: 2 [IU] via SUBCUTANEOUS
  Administered 2018-03-30: 3 [IU] via SUBCUTANEOUS
  Administered 2018-03-31 (×2): 5 [IU] via SUBCUTANEOUS

## 2018-03-29 MED ORDER — HYDRALAZINE HCL 25 MG PO TABS
25.0000 mg | ORAL_TABLET | Freq: Four times a day (QID) | ORAL | Status: DC
  Administered 2018-03-29 – 2018-03-31 (×9): 25 mg via ORAL
  Filled 2018-03-29 (×9): qty 1

## 2018-03-29 MED ORDER — INSULIN ASPART 100 UNIT/ML ~~LOC~~ SOLN
4.0000 [IU] | Freq: Three times a day (TID) | SUBCUTANEOUS | Status: DC
Start: 2018-03-29 — End: 2018-03-29

## 2018-03-29 NOTE — Progress Notes (Signed)
Inpatient Diabetes Program Recommendations  AACE/ADA: New Consensus Statement on Inpatient Glycemic Control (2015)  Target Ranges:  Prepandial:   less than 140 mg/dL      Peak postprandial:   less than 180 mg/dL (1-2 hours)      Critically ill patients:  140 - 180 mg/dL   Results for DAPHNEY, HOPKE (MRN [id number]) as of 03/29/2018 09:14  Ref. Range 03/28/2018 11:43 03/28/2018 16:46 03/28/2018 23:01  Glucose-Capillary Latest Ref Range: 65 - 99 mg/dL 244 (H)  3 units NOVOLOG  226 (H)  3 units NOVOLOG  343 (H)   Results for JERELINE, TICER (MRN [id number]) as of 03/29/2018 09:14  Ref. Range 03/29/2018 08:03  Glucose-Capillary Latest Ref Range: 65 - 99 mg/dL 266 (H)  8 units NOVOLOG +  2 mg Amaryl   Results for SHIREL, MALLIS (MRN [id number]) as of 03/29/2018 09:14  Ref. Range 07/08/2017 01:15 03/28/2018 04:52  Hemoglobin A1C Latest Ref Range: 4.8 - 5.6 % 5.3  105 mg/dl Average glucose 9.2 (H)  217 mg/dl Average glcuose    Admit with: Acute Urosepsis  History: DM, Adrenal insufficiency, CKD  Home DM Meds: None (has been taking Hydrocortisone 10 mg BID at home)  Current Insulin Orders: Novolog Moderate Correction Scale/ SSI (0-15 units) TID AC       Amaryl 2 mg daily       Note patient receiving Hydrocortisone 10 mg BID in hospital (and at home).    Note Novolog SSI started yesterday at 12pm.  SSI increased to Moderate scale this AM.  Amaryl 2 mg daily also started this AM as well.  Patient will likely need oral DM medication therapy at home.    --Will follow patient during hospitalization--  Wyn Quaker RN, MSN, CDE Diabetes Coordinator Inpatient Glycemic Control Team Team Pager: [phone] (8a-5p)

## 2018-03-29 NOTE — Progress Notes (Signed)
Ref: Dixie Dials, MD   Subjective:  Feeling better. Afebrile. Blood sugars are still elevated. Creatinine down to 1.81.  Objective:  Vital Signs in the last 24 hours: Temp:  [98 F (36.7 C)-98.6 F (37 C)] 98.2 F (36.8 C) (04/10 0524) Pulse Rate:  [58-79] 72 (04/10 0714) Cardiac Rhythm: Normal sinus rhythm (04/09 1900) Resp:  [18-19] 18 (04/10 0524) BP: (167-190)/(57-70) 187/65 (04/10 0714) SpO2:  [97 %-99 %] 98 % (04/10 0524)  Physical Exam: BP Readings from Last 1 Encounters:  03/29/18 (!) 187/65     Wt Readings from Last 1 Encounters:  03/28/18 52.5 kg (115 lb 11.9 oz)    Weight change:  Body mass index is 19.87 kg/m. HEENT: [place]/AT, Eyes-Brown, PERL, EOMI, Conjunctiva-Pale pink, Sclera-Non-icteric Neck: No JVD, No bruit, Trachea midline. Lungs:  Clear, Bilateral. Cardiac:  Regular rhythm, normal S1 and S2, no S3. II/VI systolic murmur. Abdomen:  Soft, non-tender. BS present. Extremities:  No edema present. No cyanosis. No clubbing. CNS: AxOx3, Cranial nerves grossly intact, moves all 4 extremities.  Skin: Warm and dry.   Intake/Output from previous day: 04/09 0701 - 04/10 0700 In: 1162.5 [P.O.:360; I.V.:702.5; IV Piggyback:100] Out: -     Lab Results: BMET    Component Value Date/Time   NA 135 03/29/2018 0454   NA 136 03/28/2018 0452   NA 127 (L) 03/27/2018 0118   K 4.0 03/29/2018 0454   K 4.3 03/28/2018 0452   K 5.7 (H) 03/27/2018 0118   CL 108 03/29/2018 0454   CL 111 03/28/2018 0452   CL 97 (L) 03/27/2018 0118   CO2 20 (L) 03/29/2018 0454   CO2 19 (L) 03/28/2018 0452   CO2 21 (L) 03/27/2018 0118   GLUCOSE 293 (H) 03/29/2018 0454   GLUCOSE 286 (H) 03/28/2018 0452   GLUCOSE 185 (H) 03/27/2018 0118   BUN 17 03/29/2018 0454   BUN 21 (H) 03/28/2018 0452   BUN 18 03/27/2018 0118   CREATININE 1.81 (H) 03/29/2018 0454   CREATININE 2.00 (H) 03/28/2018 0452   CREATININE 2.25 (H) 03/27/2018 0118   CALCIUM 8.5 (L) 03/29/2018 0454   CALCIUM 8.3 (L)  03/28/2018 0452   CALCIUM 8.4 (L) 03/27/2018 0118   GFRNONAA 30 (L) 03/29/2018 0454   GFRNONAA 26 (L) 03/28/2018 0452   GFRNONAA 23 (L) 03/27/2018 0118   GFRAA 34 (L) 03/29/2018 0454   GFRAA 31 (L) 03/28/2018 0452   GFRAA 26 (L) 03/27/2018 0118   CBC    Component Value Date/Time   WBC 9.2 03/28/2018 0452   RBC 3.25 (L) 03/28/2018 0452   HGB 9.3 (L) 03/28/2018 0452   HCT 26.4 (L) 03/28/2018 0452   PLT 152 03/28/2018 0452   MCV 81.2 03/28/2018 0452   MCH 28.6 03/28/2018 0452   MCHC 35.2 03/28/2018 0452   RDW 13.2 03/28/2018 0452   LYMPHSABS 1.1 03/27/2018 0118   MONOABS 0.3 03/27/2018 0118   EOSABS 0.2 03/27/2018 0118   BASOSABS 0.0 03/27/2018 0118   HEPATIC Function Panel Recent Labs    07/08/17 0115 12/05/17 1341 03/27/18 0118  PROT 7.1 7.2 5.3*   HEMOGLOBIN A1C No components found for: HGA1C,  MPG CARDIAC ENZYMES Lab Results  Component Value Date   CKTOTAL 147 06/12/2011   CKMB 3.2 06/12/2011   TROPONINI  05/13/2011    <0.30        Due to the release kinetics of cTnI, a negative result within the first hours of the onset of symptoms does not rule out myocardial infarction  with certainty. If myocardial infarction is still suspected, repeat the test at appropriate intervals. **Please note change in reference range.**   TROPONINI  05/12/2011    <0.30        Due to the release kinetics of cTnI, a negative result within the first hours of the onset of symptoms does not rule out myocardial infarction with certainty. If myocardial infarction is still suspected, repeat the test at appropriate intervals. **Please note change in reference range.**   TROPONINI  05/12/2011    <0.30        Due to the release kinetics of cTnI, a negative result within the first hours of the onset of symptoms does not rule out myocardial infarction with certainty. If myocardial infarction is still suspected, repeat the test at appropriate intervals. **Please note change in reference  range.**   BNP No results for input(s): PROBNP in the last 8760 hours. TSH Recent Labs    05/10/17 0506 03/28/18 0452  TSH 3.193 6.165*   CHOLESTEROL No results for input(s): CHOL in the last 8760 hours.  Scheduled Meds: . amLODipine  10 mg Oral Daily  . aspirin  81 mg Oral Daily  . glimepiride  2 mg Oral Q breakfast  . heparin  5,000 Units Subcutaneous Q8H  . hydrocortisone  10 mg Oral BID  . insulin aspart  0-15 Units Subcutaneous TID WC  . [START ON 03/30/2018] levothyroxine  150 mcg Oral QAC breakfast  . multivitamin with minerals  1 tablet Oral Daily  . pantoprazole  40 mg Oral Daily   Continuous Infusions: . sodium chloride 50 mL/hr at 03/28/18 0946  . piperacillin-tazobactam (ZOSYN)  IV Stopped (03/29/18 3825)  . vancomycin Stopped (03/28/18 2312)   PRN Meds:.acetaminophen, hydrALAZINE  Assessment/Plan: Acute urosepsis Possible viral syndrome Type 2 DM, uncontrolled Hypertension Hypothyroidism Acute on chronic adrenal insufficiency Anemia of chronic disease Moderate protein calorie malnutrition CKD, III  Decrease IV fluids to 30 cc/hr. Continue hydralazine for BP control Add amaryl. Increase activity.   LOS: 2 days    Dixie Dials  MD  03/29/2018, 8:29 AM

## 2018-03-29 NOTE — Progress Notes (Addendum)
Admit with: Acute Urosepsis  History: DM, Adrenal insufficiency, CKD  Home DM Meds: None (has been taking Hydrocortisone 10 mg BID at home)  Current Insulin Orders: Novolog Moderate Correction Scale/ SSI (0-15 units) TID AC                                        Amaryl 2 mg daily      Met with pt and her family today.  Discussed recent A1c results of 9.2%.  Explained what an A1c is. It's significance, goal A1c, etc.  Explained to pt and family that the Hydrocortisone she is taking at home is a steroid and that this steroid may likely be causing her CBGs to be elevated at home.  Pt stated she has been taking the Hydrocortisone for a long time now.  Checks CBGs at least daily at home and records in log book.  Sees Dr. Doylene Canard for medical needs as well.    Explained to pt and family that we have started pt on Amaryl.  Explained what Amaryl is, how it works, when to take, etc.  Also reviewed signs and symptoms of Hypoglycemia and how to treat at home, as it looks as if pt will be going on on Amaryl.  Family had questions about liquid meal replacements/supplements like Boost.  Explained to pt and family that lower carbohydrate/lower glycemic index supplements are a better choice given pt has DM, however, encouraged pt and family to seek further advice about liquid supplements from Dr. Doylene Canard and if he'd like for pt to continue to take them at home.     --Will follow patient during hospitalization--  Wyn Quaker RN, MSN, CDE Diabetes Coordinator Inpatient Glycemic Control Team Team Pager: [phone] (8a-5p)

## 2018-03-29 NOTE — Discharge Instructions (Signed)
Symptoms of Hypoglycemia: Silly, Sweaty, Shaky Check sugar if you have your meter.  If near or less than 70 mg/dl, treat with 1/2 cup juice or soda or take glucose tablets Check sugar 15 minutes after treatment.  If sugar still near or less than 70 mg/al and symptomatic, treat again and may need a snack with some protein (peanut butter with crackers, etc)   Fingerstick glucose (sugar) goals for home: Before meals: 80-130 mg/dl 2-Hours after meals: less than 180 mg/dl Hemoglobin A1c goal: 7% or less

## 2018-03-30 ENCOUNTER — Encounter (HOSPITAL_COMMUNITY): Payer: Self-pay

## 2018-03-30 LAB — COMPREHENSIVE METABOLIC PANEL
ALBUMIN: 3.1 g/dL — AB (ref 3.5–5.0)
ALK PHOS: 50 U/L (ref 38–126)
ALT: 39 U/L (ref 14–54)
ANION GAP: 11 (ref 5–15)
AST: 23 U/L (ref 15–41)
BUN: 20 mg/dL (ref 6–20)
CO2: 18 mmol/L — AB (ref 22–32)
Calcium: 8.8 mg/dL — ABNORMAL LOW (ref 8.9–10.3)
Chloride: 107 mmol/L (ref 101–111)
Creatinine, Ser: 1.93 mg/dL — ABNORMAL HIGH (ref 0.44–1.00)
GFR calc Af Amer: 32 mL/min — ABNORMAL LOW (ref >60)
GFR calc non Af Amer: 27 mL/min — ABNORMAL LOW (ref >60)
GLUCOSE: 177 mg/dL — AB (ref 65–99)
POTASSIUM: 3.7 mmol/L (ref 3.5–5.1)
SODIUM: 136 mmol/L (ref 135–145)
Total Bilirubin: 0.6 mg/dL (ref 0.3–1.2)
Total Protein: 6.2 g/dL — ABNORMAL LOW (ref 6.5–8.1)

## 2018-03-30 LAB — CBC WITH DIFFERENTIAL/PLATELET
Basophils Absolute: 0.1 10*3/uL (ref 0.0–0.1)
Basophils Relative: 1 %
EOS PCT: 5 %
Eosinophils Absolute: 0.4 10*3/uL (ref 0.0–0.7)
HEMATOCRIT: 27.5 % — AB (ref 36.0–46.0)
Hemoglobin: 9.6 g/dL — ABNORMAL LOW (ref 12.0–15.0)
LYMPHS PCT: 23 %
Lymphs Abs: 2.1 10*3/uL (ref 0.7–4.0)
MCH: 29.4 pg (ref 26.0–34.0)
MCHC: 34.9 g/dL (ref 30.0–36.0)
MCV: 84.1 fL (ref 78.0–100.0)
MONO ABS: 0.4 10*3/uL (ref 0.1–1.0)
MONOS PCT: 4 %
NEUTROS ABS: 6.1 10*3/uL (ref 1.7–7.7)
Neutrophils Relative %: 67 %
PLATELETS: 187 10*3/uL (ref 150–400)
RBC: 3.27 MIL/uL — ABNORMAL LOW (ref 3.87–5.11)
RDW: 14.1 % (ref 11.5–15.5)
WBC: 9 10*3/uL (ref 4.0–10.5)

## 2018-03-30 LAB — GLUCOSE, CAPILLARY
Glucose-Capillary: 131 mg/dL — ABNORMAL HIGH (ref 65–99)
Glucose-Capillary: 162 mg/dL — ABNORMAL HIGH (ref 65–99)
Glucose-Capillary: 75 mg/dL (ref 65–99)

## 2018-03-30 MED ORDER — ONDANSETRON HCL 4 MG/2ML IJ SOLN
4.0000 mg | Freq: Four times a day (QID) | INTRAMUSCULAR | Status: DC | PRN
  Administered 2018-03-30: 4 mg via INTRAVENOUS
  Filled 2018-03-30: qty 2

## 2018-03-30 NOTE — Progress Notes (Signed)
Pharmacy Antibiotic Note  [name] is a 57 y.o. female admitted on 03/27/2018 with sepsis.  Pharmacy has been consulted for vancomycin and zosyn dosing. 03/30/2018 WBC WNL, Afebrile, ? Source of sepsis. UA negative.    Plan: D/w MD - 1 more day IV abx then change to PO 4/12 Zosyn 3.375g IV q8h (4 hour infusion).  vancomycin 750 mg IV q48 for est AUC ~ 480 using SCr 2.25 and TBW/TBW. daily SCr while on vanc/zosyn F/u renal function, wbc, temp, culture data Vancomycin levels as needed  Height: 5\' 4"  (162.6 cm) Weight: 115 lb 8 oz (52.4 kg) IBW/kg (Calculated) : 54.7  Temp (24hrs), Avg:98.4 F (36.9 C), Min:98.3 F (36.8 C), Max:98.6 F (37 C)  Recent Labs  Lab 03/27/18 0118 03/27/18 0125 03/28/18 0452 03/29/18 0454 03/30/18 0445  WBC 7.2  --  9.2  --  9.0  CREATININE 2.25*  --  2.00* 1.81* 1.93*  LATICACIDVEN  --  1.79  --   --   --     Estimated Creatinine Clearance: 26.3 mL/min (A) (by C-G formula based on SCr of 1.93 mg/dL (H)).    Allergies  Allergen Reactions  . Percocet [Oxycodone-Acetaminophen] Rash    Antimicrobials this admission: 4/8 zosyn>> 4/8 vanc>> Dose adjustments this admission:  Microbiology results: 4/8 BCx2>>ngtd  Thank you for allowing pharmacy to be a part of this patient's care.  Eudelia Bunch, Pharm.D. [phone] 03/30/2018 8:35 AM

## 2018-03-30 NOTE — Progress Notes (Signed)
Ref: Dixie Dials, MD   Subjective:  Nausea and vomiting. No abdominal pain. Mild lowering of blood sugar and systolic blood pressure.   Objective:  Vital Signs in the last 24 hours: Temp:  [98.3 F (36.8 C)-98.6 F (37 C)] 98.3 F (36.8 C) (04/11 0450) Pulse Rate:  [63-88] 88 (04/11 0822) Resp:  [18-20] 20 (04/11 0450) BP: (163-178)/(57-67) 178/67 (04/11 0822) SpO2:  [99 %-100 %] 99 % (04/11 0822) Weight:  [52.4 kg (115 lb 8 oz)] 52.4 kg (115 lb 8 oz) (04/11 0450)  Physical Exam: BP Readings from Last 1 Encounters:  03/30/18 (!) 178/67     Wt Readings from Last 1 Encounters:  03/30/18 52.4 kg (115 lb 8 oz)    Weight change:  Body mass index is 19.83 kg/m. HEENT: [place]/AT, Eyes-Brown, PERL, EOMI, Conjunctiva-Pale pink, Sclera-Non-icteric Neck: No JVD, No bruit, Trachea midline. Lungs:  Clear, Bilateral. Cardiac:  Regular rhythm, normal S1 and S2, no S3. II/VI systolic murmur. Abdomen:  Soft, non-tender. BS present. Extremities:  trace edema of hands and ankles present. No cyanosis. No clubbing. CNS: AxOx3, Cranial nerves grossly intact, moves all 4 extremities.  Skin: Warm and dry.   Intake/Output from previous day: 04/10 0701 - 04/11 0700 In: 830 [P.O.:600; I.V.:180; IV Piggyback:50] Out: -     Lab Results: BMET    Component Value Date/Time   NA 136 03/30/2018 0445   NA 135 03/29/2018 0454   NA 136 03/28/2018 0452   K 3.7 03/30/2018 0445   K 4.0 03/29/2018 0454   K 4.3 03/28/2018 0452   CL 107 03/30/2018 0445   CL 108 03/29/2018 0454   CL 111 03/28/2018 0452   CO2 18 (L) 03/30/2018 0445   CO2 20 (L) 03/29/2018 0454   CO2 19 (L) 03/28/2018 0452   GLUCOSE 177 (H) 03/30/2018 0445   GLUCOSE 293 (H) 03/29/2018 0454   GLUCOSE 286 (H) 03/28/2018 0452   BUN 20 03/30/2018 0445   BUN 17 03/29/2018 0454   BUN 21 (H) 03/28/2018 0452   CREATININE 1.93 (H) 03/30/2018 0445   CREATININE 1.81 (H) 03/29/2018 0454   CREATININE 2.00 (H) 03/28/2018 0452   CALCIUM 8.8 (L)  03/30/2018 0445   CALCIUM 8.5 (L) 03/29/2018 0454   CALCIUM 8.3 (L) 03/28/2018 0452   GFRNONAA 27 (L) 03/30/2018 0445   GFRNONAA 30 (L) 03/29/2018 0454   GFRNONAA 26 (L) 03/28/2018 0452   GFRAA 32 (L) 03/30/2018 0445   GFRAA 34 (L) 03/29/2018 0454   GFRAA 31 (L) 03/28/2018 0452   CBC    Component Value Date/Time   WBC 9.0 03/30/2018 0445   RBC 3.27 (L) 03/30/2018 0445   HGB 9.6 (L) 03/30/2018 0445   HCT 27.5 (L) 03/30/2018 0445   PLT 187 03/30/2018 0445   MCV 84.1 03/30/2018 0445   MCH 29.4 03/30/2018 0445   MCHC 34.9 03/30/2018 0445   RDW 14.1 03/30/2018 0445   LYMPHSABS 2.1 03/30/2018 0445   MONOABS 0.4 03/30/2018 0445   EOSABS 0.4 03/30/2018 0445   BASOSABS 0.1 03/30/2018 0445   HEPATIC Function Panel Recent Labs    12/05/17 1341 03/27/18 0118 03/30/18 0445  PROT 7.2 5.3* 6.2*   HEMOGLOBIN A1C No components found for: HGA1C,  MPG CARDIAC ENZYMES Lab Results  Component Value Date   CKTOTAL 147 06/12/2011   CKMB 3.2 06/12/2011   TROPONINI  05/13/2011    <0.30        Due to the release kinetics of cTnI, a negative result within  the first hours of the onset of symptoms does not rule out myocardial infarction with certainty. If myocardial infarction is still suspected, repeat the test at appropriate intervals. **Please note change in reference range.**   TROPONINI  05/12/2011    <0.30        Due to the release kinetics of cTnI, a negative result within the first hours of the onset of symptoms does not rule out myocardial infarction with certainty. If myocardial infarction is still suspected, repeat the test at appropriate intervals. **Please note change in reference range.**   TROPONINI  05/12/2011    <0.30        Due to the release kinetics of cTnI, a negative result within the first hours of the onset of symptoms does not rule out myocardial infarction with certainty. If myocardial infarction is still suspected, repeat the test at appropriate  intervals. **Please note change in reference range.**   BNP No results for input(s): PROBNP in the last 8760 hours. TSH Recent Labs    05/10/17 0506 03/28/18 0452  TSH 3.193 6.165*   CHOLESTEROL No results for input(s): CHOL in the last 8760 hours.  Scheduled Meds: . amLODipine  10 mg Oral Daily  . aspirin  81 mg Oral Daily  . glimepiride  2 mg Oral Q breakfast  . heparin  5,000 Units Subcutaneous Q8H  . hydrALAZINE  25 mg Oral Q6H  . hydrocortisone  10 mg Oral BID  . insulin aspart  0-15 Units Subcutaneous TID WC  . levothyroxine  150 mcg Oral QAC breakfast  . multivitamin with minerals  1 tablet Oral Daily  . pantoprazole  40 mg Oral Daily   Continuous Infusions: . piperacillin-tazobactam (ZOSYN)  IV 3.375 g (03/30/18 0806)  . vancomycin Stopped (03/28/18 2312)   PRN Meds:.acetaminophen, ondansetron (ZOFRAN) IV  Assessment/Plan: Acute urosepsis Possible viral syndrome Type 2 DM, uncontrolled Hypertension Hypothyroidism Acute on chronic adrenal insufficiency Anemia of chronic disease Moderate protein calorie malnutrition CKD, III  Zofran IV. May discontinue IV fluids if has adequate oral intake.   LOS: 3 days    Dixie Dials  MD  03/30/2018, 8:35 AM

## 2018-03-31 ENCOUNTER — Inpatient Hospital Stay (HOSPITAL_COMMUNITY): Payer: Self-pay

## 2018-03-31 ENCOUNTER — Encounter (HOSPITAL_COMMUNITY): Payer: Self-pay

## 2018-03-31 LAB — CBC WITH DIFFERENTIAL/PLATELET
BASOS ABS: 0.1 10*3/uL (ref 0.0–0.1)
BASOS PCT: 1 %
EOS ABS: 0.7 10*3/uL (ref 0.0–0.7)
Eosinophils Relative: 8 %
HCT: 29 % — ABNORMAL LOW (ref 36.0–46.0)
HEMOGLOBIN: 9.7 g/dL — AB (ref 12.0–15.0)
Lymphocytes Relative: 18 %
Lymphs Abs: 1.7 10*3/uL (ref 0.7–4.0)
MCH: 28.3 pg (ref 26.0–34.0)
MCHC: 33.4 g/dL (ref 30.0–36.0)
MCV: 84.5 fL (ref 78.0–100.0)
Monocytes Absolute: 0.4 10*3/uL (ref 0.1–1.0)
Monocytes Relative: 4 %
NEUTROS PCT: 69 %
Neutro Abs: 6.6 10*3/uL (ref 1.7–7.7)
Platelets: 217 10*3/uL (ref 150–400)
RBC: 3.43 MIL/uL — ABNORMAL LOW (ref 3.87–5.11)
RDW: 14.2 % (ref 11.5–15.5)
WBC: 9.6 10*3/uL (ref 4.0–10.5)

## 2018-03-31 LAB — URIC ACID: URIC ACID, SERUM: 2.5 mg/dL (ref 2.3–6.6)

## 2018-03-31 LAB — BASIC METABOLIC PANEL
Anion gap: 10 (ref 5–15)
BUN: 25 mg/dL — ABNORMAL HIGH (ref 6–20)
CHLORIDE: 105 mmol/L (ref 101–111)
CO2: 21 mmol/L — ABNORMAL LOW (ref 22–32)
CREATININE: 2 mg/dL — AB (ref 0.44–1.00)
Calcium: 8.8 mg/dL — ABNORMAL LOW (ref 8.9–10.3)
GFR calc non Af Amer: 26 mL/min — ABNORMAL LOW (ref >60)
GFR, EST AFRICAN AMERICAN: 31 mL/min — AB (ref >60)
Glucose, Bld: 217 mg/dL — ABNORMAL HIGH (ref 65–99)
Potassium: 3.7 mmol/L (ref 3.5–5.1)
SODIUM: 136 mmol/L (ref 135–145)

## 2018-03-31 LAB — GLUCOSE, CAPILLARY
GLUCOSE-CAPILLARY: 239 mg/dL — AB (ref 65–99)
Glucose-Capillary: 191 mg/dL — ABNORMAL HIGH (ref 65–99)
Glucose-Capillary: 203 mg/dL — ABNORMAL HIGH (ref 65–99)

## 2018-03-31 LAB — IRON AND TIBC
Iron: 95 ug/dL (ref 28–170)
SATURATION RATIOS: 36 % — AB (ref 10.4–31.8)
TIBC: 265 ug/dL (ref 250–450)
UIBC: 170 ug/dL

## 2018-03-31 MED ORDER — CIPROFLOXACIN HCL 250 MG PO TABS: 250.0000 mg | ORAL_TABLET | Freq: Every day | ORAL | 0 refills | Status: DC

## 2018-03-31 MED ORDER — MAGNESIUM CITRATE PO SOLN: 1.0000 | Freq: Once | ORAL | Status: AC

## 2018-03-31 MED ORDER — LEVOTHYROXINE SODIUM 150 MCG PO TABS: 150.0000 ug | ORAL_TABLET | Freq: Every day | ORAL | 3 refills | Status: DC

## 2018-03-31 MED ORDER — HYDRALAZINE HCL 25 MG PO TABS: 25.0000 mg | ORAL_TABLET | Freq: Four times a day (QID) | ORAL | 3 refills | Status: DC

## 2018-03-31 MED ORDER — GLIMEPIRIDE 2 MG PO TABS: 2.0000 mg | ORAL_TABLET | Freq: Every day | ORAL | 3 refills | Status: DC

## 2018-03-31 MED ORDER — CIPROFLOXACIN HCL 250 MG PO TABS: 250.0000 mg | ORAL_TABLET | Freq: Two times a day (BID) | ORAL | Status: DC

## 2018-03-31 MED ORDER — ACETAMINOPHEN 325 MG PO TABS: 650.0000 mg | ORAL_TABLET | Freq: Four times a day (QID) | ORAL | Status: DC | PRN

## 2018-03-31 NOTE — Discharge Summary (Signed)
Physician Discharge Summary  Patient ID: [name] MRN: [id number] DOB/AGE: 1961/10/13 57 y.o.  Admit date: 03/27/2018 Discharge date: 03/31/2018  Admission Diagnoses: Fever Urosepsis Type 2 DM Hypothyroidism Acute on chronic adrenal insufficiency Anemia of chronic disease Moderate protein calorie malnutrition CKD, III Hyperkalemia  Discharge Diagnoses:  Principle Problem: Acute Urosepsis Active Problems:   Acute on chronic adrenal insufficiency (HCC)   Possible viral syndrome   Type 2 DM   Hypothyroidism   Hypertension, Essential   Hyperkalemia-improved   Hyponatremia-improved   CKD, IV   Moderate protein calorie malnutrition   Constipation   Anemia of chronic disease  Discharged Condition: fair  Hospital Course: 57 year old female had upper respiratory tract infection followed by fever and acute adrenal insufficiency. She was lethargic and shivering. She was treated with IV Zosyn and Vancomycin and IV fluids + IV hydrocortisone with significant improvement.  Her renal function deteriorated some with Creatinine of 2.25, improving little with IV fluids use.  Her iron level and uric acid levels were normal.  Her hyperkalemia and hyponatremia improved with Saline infusion.  Her elevated blood sugar levels responded to insulin followed by PO Glimepiride use. She had stopped her metformin due to renal insufficiency and was out of Glucotrol.  Her thyroid dose was adjusted to  150 mcg per day for elevated TSH of 6.165 Her renal ultrasound was unremarkable. X-ray abdomen showed constipation without obstruction. She was discharged home in stable condition with follow up by me in 1 week.  Consults: cardiology  Significant Diagnostic Studies: labs: Sodium 127 meq, potassium 5.7 meq. Creatinine 2.25 and blood glucose of 185 mg. on admission. TSH-6.165, Hgb A1C was 9.2 Serum Iron level was 95 mcg. and uric acid level was 2.5 mg.   Urinalysis showed moderate  leukocytosis.  Chest x-ray was unremarkable.  Renal ultrasound was also unremarkable.  Treatments: IV hydration and antibiotics: vancomycin and Zosyn.  Discharge Exam: Blood pressure (!) 186/67, pulse 80, temperature 98.7 F (37.1 C), temperature source Oral, resp. rate 16, height 5\' 4"  (1.626 m), weight 51.6 kg (113 lb 11.2 oz), SpO2 99 %. General appearance: alert, cooperative and appears stated age. Head: Normocephalic, atraumatic. Eyes: Brown eyes, pink conjunctiva, corneas clear. PERRL, EOM's intact.  Neck: No adenopathy, no carotid bruit, no JVD, supple, symmetrical, trachea midline and thyroid not enlarged. Resp: Clear to auscultation bilaterally. Cardio: Regular rate and rhythm, S1, S2 normal, II/VI systolic murmur, no click, rub or gallop. GI: Soft, non-tender; bowel sounds normal; no organomegaly. Extremities: No edema, cyanosis or clubbing. Skin: Warm and dry.  Neurologic: Alert and oriented X 3, normal strength and tone. Normal coordination and gait.  Disposition: Discharge disposition: 01-Home or Self Care        Allergies as of 03/31/2018      Reactions   Percocet [oxycodone-acetaminophen] Rash      Medication List    TAKE these medications   acetaminophen 325 MG tablet Commonly known as:  TYLENOL Take 2 tablets (650 mg total) by mouth every 6 (six) hours as needed for mild pain.   amLODipine 5 MG tablet Commonly known as:  NORVASC Take 10 mg by mouth daily.   aspirin 81 MG chewable tablet Chew 81 mg by mouth daily.   ciprofloxacin 250 MG tablet Commonly known as:  CIPRO Take 1 tablet (250 mg total) by mouth daily.   glimepiride 2 MG tablet Commonly known as:  AMARYL Take 1 tablet (2 mg total) by mouth daily with breakfast. Start taking on:  04/01/2018   hydrALAZINE 25 MG tablet Commonly known as:  APRESOLINE Take 1 tablet (25 mg total) by mouth every 6 (six) hours.   hydrocortisone 10 MG tablet Commonly known as:  CORTEF Take 10 mg by mouth  2 (two) times daily.   levothyroxine 150 MCG tablet Commonly known as:  SYNTHROID, LEVOTHROID Take 1 tablet (150 mcg total) by mouth daily before breakfast. Start taking on:  04/01/2018 What changed:    medication strength  how much to take   magnesium citrate Soln Take 296 mLs (1 Bottle total) by mouth once for 1 dose.   multivitamin with minerals Tabs tablet Take 1 tablet by mouth daily.   pantoprazole 40 MG tablet Commonly known as:  PROTONIX Take 40 mg by mouth daily.      Follow-up Information    Dixie Dials, MD. Schedule an appointment as soon as possible for a visit in 1 week(s).   Specialty:  Cardiology Contact information: [place] [address] [phone]           Signed: Birdie Riddle 03/31/2018, 4:33 PM

## 2018-03-31 NOTE — Plan of Care (Signed)

## 2018-04-01 LAB — CULTURE, BLOOD (ROUTINE X 2)
Culture: NO GROWTH
Culture: NO GROWTH
SPECIAL REQUESTS: ADEQUATE

## 2020-01-27 ENCOUNTER — Other Ambulatory Visit: Payer: Self-pay

## 2020-01-27 ENCOUNTER — Emergency Department (HOSPITAL_COMMUNITY): Payer: Self-pay

## 2020-01-27 ENCOUNTER — Emergency Department (HOSPITAL_COMMUNITY)
Admission: EM | Admit: 2020-01-27 | Discharge: 2020-01-28 | Disposition: A | Discharge status: Home / Self Care | Payer: Self-pay | Attending: Emergency Medicine | Admitting: Emergency Medicine

## 2020-01-27 ENCOUNTER — Encounter (HOSPITAL_COMMUNITY): Payer: Self-pay | Admitting: Emergency Medicine

## 2020-01-27 DIAGNOSIS — E119 Type 2 diabetes mellitus without complications: Secondary | ICD-10-CM | POA: Insufficient documentation

## 2020-01-27 DIAGNOSIS — Y999 Unspecified external cause status: Secondary | ICD-10-CM | POA: Insufficient documentation

## 2020-01-27 DIAGNOSIS — M25561 Pain in right knee: Secondary | ICD-10-CM | POA: Insufficient documentation

## 2020-01-27 DIAGNOSIS — I10 Essential (primary) hypertension: Secondary | ICD-10-CM | POA: Insufficient documentation

## 2020-01-27 DIAGNOSIS — Y9389 Activity, other specified: Secondary | ICD-10-CM | POA: Insufficient documentation

## 2020-01-27 DIAGNOSIS — W109XXA Fall (on) (from) unspecified stairs and steps, initial encounter: Secondary | ICD-10-CM | POA: Insufficient documentation

## 2020-01-27 DIAGNOSIS — Y92009 Unspecified place in unspecified non-institutional (private) residence as the place of occurrence of the external cause: Secondary | ICD-10-CM | POA: Insufficient documentation

## 2020-01-27 DIAGNOSIS — M25461 Effusion, right knee: Secondary | ICD-10-CM | POA: Insufficient documentation

## 2020-01-27 DIAGNOSIS — Z7982 Long term (current) use of aspirin: Secondary | ICD-10-CM | POA: Insufficient documentation

## 2020-01-27 DIAGNOSIS — Z79899 Other long term (current) drug therapy: Secondary | ICD-10-CM | POA: Insufficient documentation

## 2020-01-27 DIAGNOSIS — R400 Somnolence: Secondary | ICD-10-CM | POA: Insufficient documentation

## 2020-01-27 MED ORDER — HYDROCODONE-ACETAMINOPHEN 5-325 MG PO TABS
1.0000 | ORAL_TABLET | Freq: Once | ORAL | Status: AC
  Administered 2020-01-27: 1 via ORAL
  Filled 2020-01-27: qty 1

## 2020-01-27 MED ORDER — IBUPROFEN 200 MG PO TABS
600.0000 mg | ORAL_TABLET | Freq: Once | ORAL | Status: AC
  Administered 2020-01-27: 600 mg via ORAL
  Filled 2020-01-27: qty 3

## 2020-01-27 NOTE — ED Provider Notes (Signed)
[facility] DEPT Provider Note   CSN: [id number] Arrival date & time: 01/27/20  2107     History Chief Complaint  Patient presents with  . Knee Injury  . Fall    [name] is a 59 y.o. female.  Patient fell while walking down stairs tonight causing right knee pain. She went down to a sitting position on the stairs after her foot slipped but is unsure of the exact mechanism of knee injury. Since that time she has developed swelling and progressive, now severe, knee pain. She denies other injury. No back pain. She is somnolent requiring vigorous shaking to awaken. When asked if she took anything to make her drowsy, she denies and states "pain makes me sleepy".   The history is provided by the patient. No language interpreter was used.  Fall Pertinent negatives include no chest pain and no abdominal pain.       Past Medical History:  Diagnosis Date  . Diabetes mellitus without complication ([place])   . Hypertension   . Thyroid disease     Patient Active Problem List   Diagnosis Date Noted  . Acute adrenal insufficiency ([place]) 03/27/2018  . Dehydration 12/09/2017  . Abdominal pain, bilateral lower quadrant 12/09/2017  . Hypoglycemia 07/08/2017  . Hypoglycemia associated with diabetes ([place]) 05/10/2017  . Malnutrition of moderate degree 05/10/2017    Past Surgical History:  Procedure Laterality Date  . CESAREAN SECTION       OB History   No obstetric history on file.     Family History  Problem Relation Age of Onset  . Diabetes Mother   . Hyperlipidemia Father   . Hypertension Father     Social History   Tobacco Use  . Smoking status: Never Smoker  . Smokeless tobacco: Never Used  Substance Use Topics  . Alcohol use: No  . Drug use: No    Home Medications Prior to Admission medications   Medication Sig Start Date End Date Taking? Authorizing Provider  amLODipine (NORVASC) 5 MG tablet Take 10 mg by mouth daily.    Yes  [provider]  aspirin 81 MG chewable tablet Chew 81 mg by mouth daily.   Yes [provider]  hydrALAZINE (APRESOLINE) 25 MG tablet Take 1 tablet (25 mg total) by mouth every 6 (six) hours. 03/31/18  Yes Dixie Dials, MD  hydrocortisone (CORTEF) 10 MG tablet Take 10 mg by mouth 2 (two) times daily.   Yes [provider]  levothyroxine (SYNTHROID, LEVOTHROID) 150 MCG tablet Take 1 tablet (150 mcg total) by mouth daily before breakfast. 04/01/18  Yes Dixie Dials, MD  Multiple Vitamin (MULTIVITAMIN WITH MINERALS) TABS tablet Take 1 tablet by mouth daily.   Yes [provider]  acetaminophen (TYLENOL) 325 MG tablet Take 2 tablets (650 mg total) by mouth every 6 (six) hours as needed for mild pain. Patient not taking: Reported on 01/27/2020 03/31/18   Dixie Dials, MD  ciprofloxacin (CIPRO) 250 MG tablet Take 1 tablet (250 mg total) by mouth daily. Patient not taking: Reported on 01/27/2020 03/31/18   Dixie Dials, MD  glimepiride (AMARYL) 2 MG tablet Take 1 tablet (2 mg total) by mouth daily with breakfast. Patient not taking: Reported on 01/27/2020 04/01/18   Dixie Dials, MD    Allergies    Percocet [oxycodone-acetaminophen]  Review of Systems   Review of Systems  Cardiovascular: Negative.  Negative for chest pain.  Gastrointestinal: Negative.  Negative for abdominal pain.  Musculoskeletal: Negative for  back pain and neck pain.       See HPI.  Skin: Negative.  Negative for wound.  Neurological: Negative.  Negative for numbness.    Physical Exam Updated Vital Signs BP (!) 204/64   Pulse (!) 56   Temp 98.4 F (36.9 C) (Oral)   Resp 18   SpO2 99%   Physical Exam Vitals and nursing note reviewed.  Constitutional:      Appearance: She is well-developed.  Cardiovascular:     Pulses: Normal pulses.  Pulmonary:     Effort: Pulmonary effort is normal.  Musculoskeletal:     Cervical back: Normal range of motion.     Comments: Right knee generally  tender to even light palpation. There is global swelling of the knee inclusive of proximal lower leg. No calf tenderness. No discoloration or bony deformity.   Skin:    General: Skin is warm and dry.  Neurological:     Mental Status: She is alert and oriented to person, place, and time.     ED Results / Procedures / Treatments   Labs (all labs ordered are listed, but only abnormal results are displayed) Labs Reviewed - No data to display  EKG None  Radiology DG Knee Complete 4 Views Right  Result Date: 01/27/2020 CLINICAL DATA:  Fall down stairs, history of fracture 2 years prior EXAM: RIGHT KNEE - COMPLETE 4+ VIEW COMPARISON:  Knee radiograph 07/11/2014 FINDINGS: Small amount of subchondral sclerosis and cystic change along the mesial aspect of the lateral tibial plateau may reflect some posttraumatic change given the presence of fracture in this location on comparison. Alternatively, an acute on chronic osteochondral lesion could have a similar appearance. Sclerosis and a small lucency in the medial tibial spine are similar to prior without cortical step-off. Moderate joint effusion. Circumferential swelling is present. Background mild tricompartmental degenerative changes. Bidirectional patellar enthesophytes are noted anteriorly. 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 Sclerosis of the medial tibial spine may also be remote posttraumatic in nature, but remains indeterminate without cortical step-off. Moderate joint effusion. Electronically Signed   By: Lovena Le M.D.   On: 01/27/2020 22:16    Procedures Procedures (including critical care time)  Medications Ordered in ED Medications - No data to display  ED Course  I have reviewed the triage vital signs and the nursing notes.  Pertinent labs & imaging results that were available during my care of the patient were reviewed by me and  considered in my medical decision making (see chart for details).  Clinical Course as of Jan 27 300  Sun Jan 27, 2020  2351 Plain film imaging of the knee shows old injury without definite acute injury. However, patient is in significant pain, considered pain out of proportion to strain injury. CT ordered along with pain medication.    [SU]  Mon Jan 28, 2020  0118 CT negative for acute fracture. On recheck, the patient is quite somnolent, difficult to awaken. VSS, no hypoxia. Given AMS subsequent to fall, will obtain a head CT.    [SU]  0122 Head CT negative. CBG 158. On re-evaluation, the patient wakes with lights on, sitting upright position. She reports she will call for a ride home.    [SU]    Clinical Course User Index [SU] Charlann Lange, PA-C   MDM Rules/Calculators/A&P  Patient to ED with right knee injury after slipping on steps. She reports previous injury to same knee years ago. No other injury.   Patient is felt appropriate for discharge home. Will refer to Dr. Berenice Primas who she saw previously, for further evaluation if the pain and swelling do not improve in 2-3 days.   Final Clinical Impression(s) / ED Diagnoses Final diagnoses:  None   1. Right knee injury with effusion  Rx / DC Orders ED Discharge Orders    None       Charlann Lange, PA-C 01/28/20 0301    Hayden Rasmussen, MD 01/28/20 [phone]

## 2020-01-27 NOTE — ED Notes (Signed)
Pt transported to CT ?

## 2020-01-27 NOTE — ED Triage Notes (Signed)
Patient here from home reporting fall down stairs today. Right knee pain. Reports previous fracture.

## 2020-01-28 ENCOUNTER — Emergency Department (HOSPITAL_COMMUNITY): Payer: Self-pay

## 2020-01-28 LAB — CBG MONITORING, ED: Glucose-Capillary: 158 mg/dL — ABNORMAL HIGH (ref 70–99)

## 2020-01-28 MED ORDER — HYDRALAZINE HCL 10 MG PO TABS
25.0000 mg | ORAL_TABLET | Freq: Once | ORAL | Status: AC
  Administered 2020-01-28: 25 mg via ORAL
  Filled 2020-01-28: qty 3

## 2020-01-28 MED ORDER — IBUPROFEN 600 MG PO TABS: 600.0000 mg | ORAL_TABLET | Freq: Four times a day (QID) | ORAL | 0 refills | Status: DC | PRN

## 2020-01-28 NOTE — Discharge Instructions (Addendum)
Use the knee immobilizer and crutches to be weight bearing as tolerated on the right knee. Take your prescription medication as directed.  Follow up with Dr. Berenice Primas (the orthopedic doctor you have seen in the past) if knee pain is no better in 3-4 days.

## 2020-01-28 NOTE — ED Notes (Signed)
Patient transported to CT 

## 2020-10-29 ENCOUNTER — Encounter: Payer: Self-pay | Admitting: Family Medicine

## 2020-10-29 ENCOUNTER — Other Ambulatory Visit: Payer: Self-pay | Admitting: Family Medicine

## 2020-10-29 ENCOUNTER — Other Ambulatory Visit: Payer: Self-pay

## 2020-10-29 ENCOUNTER — Ambulatory Visit: Payer: Self-pay | Attending: Family Medicine | Admitting: Family Medicine

## 2020-10-29 VITALS — BP 129/54 | HR 74 | Ht 63.0 in | Wt 109.6 lb

## 2020-10-29 DIAGNOSIS — Z23 Encounter for immunization: Secondary | ICD-10-CM

## 2020-10-29 DIAGNOSIS — E1122 Type 2 diabetes mellitus with diabetic chronic kidney disease: Secondary | ICD-10-CM

## 2020-10-29 DIAGNOSIS — I129 Hypertensive chronic kidney disease with stage 1 through stage 4 chronic kidney disease, or unspecified chronic kidney disease: Secondary | ICD-10-CM

## 2020-10-29 DIAGNOSIS — I10 Essential (primary) hypertension: Secondary | ICD-10-CM

## 2020-10-29 DIAGNOSIS — N183 Chronic kidney disease, stage 3 unspecified: Secondary | ICD-10-CM

## 2020-10-29 DIAGNOSIS — E2740 Unspecified adrenocortical insufficiency: Secondary | ICD-10-CM

## 2020-10-29 DIAGNOSIS — E11649 Type 2 diabetes mellitus with hypoglycemia without coma: Secondary | ICD-10-CM

## 2020-10-29 DIAGNOSIS — E038 Other specified hypothyroidism: Secondary | ICD-10-CM

## 2020-10-29 DIAGNOSIS — R519 Headache, unspecified: Secondary | ICD-10-CM

## 2020-10-29 LAB — GLUCOSE, POCT (MANUAL RESULT ENTRY): POC Glucose: 185 mg/dl — AB (ref 70–99)

## 2020-10-29 LAB — POCT GLYCOSYLATED HEMOGLOBIN (HGB A1C): HbA1c, POC (controlled diabetic range): 6.6 % (ref 0.0–7.0)

## 2020-10-29 MED ORDER — FLUTICASONE PROPIONATE 50 MCG/ACT NA SUSP: 2.0000 | Freq: Every day | NASAL | 1 refills | Status: DC

## 2020-10-29 MED ORDER — HYDRALAZINE HCL 100 MG PO TABS: 100.0000 mg | ORAL_TABLET | Freq: Two times a day (BID) | ORAL | 3 refills | Status: DC

## 2020-10-29 MED ORDER — CETIRIZINE HCL 10 MG PO TABS: 10.0000 mg | ORAL_TABLET | Freq: Every day | ORAL | 1 refills | Status: DC

## 2020-10-29 MED ORDER — CLONIDINE HCL 0.1 MG PO TABS
0.1000 mg | ORAL_TABLET | Freq: Once | ORAL | Status: AC
  Administered 2020-10-29: 0.1 mg via ORAL

## 2020-10-29 MED ORDER — AMLODIPINE BESYLATE 10 MG PO TABS: 10.0000 mg | ORAL_TABLET | Freq: Every day | ORAL | 3 refills | Status: DC

## 2020-10-29 MED ORDER — LEVOTHYROXINE SODIUM 150 MCG PO TABS: 150.0000 ug | ORAL_TABLET | Freq: Every day | ORAL | 3 refills | Status: DC

## 2020-10-29 MED FILL — hydrALAZINE HCL 100 MG TABS: 100 | 30 days supply | Qty: 60 | Fill #0

## 2020-10-29 MED FILL — LEVOTHYROXINE SODIUM 150 MC: 150 | 30 days supply | Qty: 30 | Fill #0

## 2020-10-29 MED FILL — AMLODIPINE BESYLATE 10 MG T: 10 | 30 days supply | Qty: 30 | Fill #0

## 2020-10-29 MED FILL — FLUTICASONE PROP 50 MCG SPR: 50 | 30 days supply | Qty: 16 | Fill #0

## 2020-10-29 NOTE — Progress Notes (Signed)
Subjective:  Patient ID: [name], female    DOB: May 16, 1961  Age: 59 y.o. MRN: [id number]  CC: New Patient (Initial Visit)   HPI Irie Cude is a 59 year old female patient with a history of chronic adrenal insufficiency, hypertension, type 2 diabetes mellitus, stage III chronic kidney disease who presents today to establish care. Previously followed by Dr Doylene Canard but has not been on the care of any physician for the last couple of months. She has been out of her Diabetic medication and thyroid medications. Her blood pressure is significantly elevated at 203/66 and she endorses compliance with her antihypertensive. Complains of pressure in her frontal sinus. Used OTC decongestant with no relief. Her nostril has been dry. Endorses compliance with Cortef for her adrenal insufficiency. Past Medical History:  Diagnosis Date  . Diabetes mellitus without complication ([place])   . Hypertension   . Thyroid disease     Past Surgical History:  Procedure Laterality Date  . CESAREAN SECTION      Family History  Problem Relation Age of Onset  . Diabetes Mother   . Hyperlipidemia Father   . Hypertension Father     Allergies  Allergen Reactions  . Percocet [Oxycodone-Acetaminophen] Rash    Outpatient Medications Prior to Visit  Medication Sig Dispense Refill  . aspirin 81 MG chewable tablet Chew 81 mg by mouth daily.    . hydrocortisone (CORTEF) 10 MG tablet Take 10 mg by mouth 2 (two) times daily.    Marland Kitchen ibuprofen (ADVIL) 600 MG tablet Take 1 tablet (600 mg total) by mouth every 6 (six) hours as needed. 30 tablet 0  . Multiple Vitamin (MULTIVITAMIN WITH MINERALS) TABS tablet Take 1 tablet by mouth daily.    Marland Kitchen amLODipine (NORVASC) 5 MG tablet Take 10 mg by mouth daily.     Marland Kitchen glimepiride (AMARYL) 2 MG tablet Take 1 tablet (2 mg total) by mouth daily with breakfast. 30 tablet 3  . hydrALAZINE (APRESOLINE) 25 MG tablet Take 1 tablet (25 mg total) by mouth every 6 (six) hours. 120  tablet 3  . levothyroxine (SYNTHROID, LEVOTHROID) 150 MCG tablet Take 1 tablet (150 mcg total) by mouth daily before breakfast. 30 tablet 3  . acetaminophen (TYLENOL) 325 MG tablet Take 2 tablets (650 mg total) by mouth every 6 (six) hours as needed for mild pain. (Patient not taking: Reported on 01/27/2020)    . ciprofloxacin (CIPRO) 250 MG tablet Take 1 tablet (250 mg total) by mouth daily. (Patient not taking: Reported on 01/27/2020) 3 tablet 0   No facility-administered medications prior to visit.     ROS Review of Systems  Constitutional: Negative for activity change, appetite change and fatigue.  HENT: Positive for sinus pressure. Negative for congestion and sore throat.   Eyes: Negative for visual disturbance.  Respiratory: Negative for cough, chest tightness, shortness of breath and wheezing.   Cardiovascular: Negative for chest pain and palpitations.  Gastrointestinal: Negative for abdominal distention, abdominal pain and constipation.  Endocrine: Negative for polydipsia.  Genitourinary: Negative for dysuria and frequency.  Musculoskeletal: Negative for arthralgias and back pain.  Skin: Negative for rash.  Neurological: Negative for tremors, light-headedness and numbness.  Hematological: Does not bruise/bleed easily.  Psychiatric/Behavioral: Negative for agitation and behavioral problems.    Objective:  BP (!) 203/66   Pulse 74   Ht $R'5\' 3"'BG$  (1.6 m)   Wt 109 lb 9.6 oz (49.7 kg)   SpO2 99%   BMI 19.41 kg/m   BP/Weight 10/29/2020 01/28/2020 03/31/2018  Systolic BP 425 956 387  Diastolic BP 66 59 67  Wt. (Lbs) 109.6 - 113.7  BMI 19.41 - 19.52      Physical Exam Constitutional:      Appearance: She is well-developed.  HENT:     Head:     Comments: No sinus tenderness to percussion    Right Ear: Tympanic membrane normal.     Left Ear: Tympanic membrane normal.  Neck:     Vascular: No JVD.  Cardiovascular:     Rate and Rhythm: Normal rate.     Heart sounds: Normal  heart sounds. No murmur heard.   Pulmonary:     Effort: Pulmonary effort is normal.     Breath sounds: Normal breath sounds. No wheezing or rales.  Chest:     Chest wall: No tenderness.  Abdominal:     General: Bowel sounds are normal. There is no distension.     Palpations: Abdomen is soft. There is no mass.     Tenderness: There is no abdominal tenderness.  Musculoskeletal:        General: Normal range of motion.     Right lower leg: No edema.     Left lower leg: No edema.  Neurological:     Mental Status: She is alert and oriented to person, place, and time.  Psychiatric:        Mood and Affect: Mood normal.     CMP Latest Ref Rng & Units 03/31/2018 03/30/2018 03/29/2018  Glucose 65 - 99 mg/dL 217(H) 177(H) 293(H)  BUN 6 - 20 mg/dL 25(H) 20 17  Creatinine 0.44 - 1.00 mg/dL 2.00(H) 1.93(H) 1.81(H)  Sodium 135 - 145 mmol/L 136 136 135  Potassium 3.5 - 5.1 mmol/L 3.7 3.7 4.0  Chloride 101 - 111 mmol/L 105 107 108  CO2 22 - 32 mmol/L 21(L) 18(L) 20(L)  Calcium 8.9 - 10.3 mg/dL 8.8(L) 8.8(L) 8.5(L)  Total Protein 6.5 - 8.1 g/dL - 6.2(L) -  Total Bilirubin 0.3 - 1.2 mg/dL - 0.6 -  Alkaline Phos 38 - 126 U/L - 50 -  AST 15 - 41 U/L - 23 -  ALT 14 - 54 U/L - 39 -    Lipid Panel     Component Value Date/Time   CHOL 218 (H) 06/13/2011 0810   TRIG 85 06/13/2011 0810   HDL 65 06/13/2011 0810   CHOLHDL 3.4 06/13/2011 0810   VLDL 17 06/13/2011 0810   LDLCALC 136 (H) 06/13/2011 0810    CBC    Component Value Date/Time   WBC 9.6 03/31/2018 1205   RBC 3.43 (L) 03/31/2018 1205   HGB 9.7 (L) 03/31/2018 1205   HCT 29.0 (L) 03/31/2018 1205   PLT 217 03/31/2018 1205   MCV 84.5 03/31/2018 1205   MCH 28.3 03/31/2018 1205   MCHC 33.4 03/31/2018 1205   RDW 14.2 03/31/2018 1205   LYMPHSABS 1.7 03/31/2018 1205   MONOABS 0.4 03/31/2018 1205   EOSABS 0.7 03/31/2018 1205   BASOSABS 0.1 03/31/2018 1205    Lab Results  Component Value Date   HGBA1C 6.6 10/29/2020    Assessment  & Plan:  1. Hypoglycemia associated with diabetes ([place]) A1c 6.6 which is at goal; goal is less than 7.0 She has not been on her glimepiride I have discontinued glimepiride to prevent hypoglycemia and she will be on diet control Monitor blood sugars closely Counseled on Diabetic diet, my plate method, 564 minutes of moderate intensity exercise/week Blood sugar logs with fasting goals of  80-120 mg/dl, random of less than 180 and in the event of sugars less than 60 mg/dl or greater than 400 mg/dl encouraged to notify the clinic. Advised on the need for annual eye exams, annual foot exams, Pneumonia vaccine. - POCT glucose (manual entry) - POCT glycosylated hemoglobin (Hb A1C) - Microalbumin / creatinine urine ratio  2. Acute adrenal insufficiency (HCC) Currently on Cortef - Ambulatory referral to Endocrinology  3. Accelerated hypertension Clonidine 0.1 mg administered in the clinic, patient observed for 45 minutes and blood pressure improved to 129/54 I have increased hydralazine dose Her chart reveals she was on amlodipine 5 mg but was to take 10 mg daily.  She is unclear of her current dose.  I have placed her on 10 mg daily. Counseled on blood pressure goal of less than 130/80, low-sodium, DASH diet, medication compliance, 150 minutes of moderate intensity exercise per week. Discussed medication compliance, adverse effects. - CMP14+EGFR - cloNIDine (CATAPRES) tablet 0.1 mg  4. Other specified hypothyroidism Has been off her medications If thyroid panel is abnormal I will make no regimen changes - T4, free - TSH - levothyroxine (SYNTHROID) 150 MCG tablet; Take 1 tablet (150 mcg total) by mouth daily before breakfast.  Dispense: 30 tablet; Refill: 3  5. Sinus headache We will place on intranasal steroids and antihistamine Elevated blood pressure could also contribute - fluticasone (FLONASE) 50 MCG/ACT nasal spray; Place 2 sprays into both nostrils daily.  Dispense: 16 g; Refill:  1 - cetirizine (ZYRTEC) 10 MG tablet; Take 1 tablet (10 mg total) by mouth daily.  Dispense: 30 tablet; Refill: 1  6. Hypertension in stage 3 chronic kidney disease due to type 2 diabetes mellitus (HCC) Hypertensive and diabetic nephropathy We will send of labs and consider nephrology referral if indicated - amLODipine (NORVASC) 10 MG tablet; Take 1 tablet (10 mg total) by mouth daily.  Dispense: 30 tablet; Refill: 3 - hydrALAZINE (APRESOLINE) 100 MG tablet; Take 1 tablet (100 mg total) by mouth in the morning and at bedtime.  Dispense: 60 tablet; Refill: 3  7. Need for immunization against influenza - Flu Vaccine QUAD 36+ mos IM    Meds ordered this encounter  Medications  . levothyroxine (SYNTHROID) 150 MCG tablet    Sig: Take 1 tablet (150 mcg total) by mouth daily before breakfast.    Dispense:  30 tablet    Refill:  3  . amLODipine (NORVASC) 10 MG tablet    Sig: Take 1 tablet (10 mg total) by mouth daily.    Dispense:  30 tablet    Refill:  3  . hydrALAZINE (APRESOLINE) 100 MG tablet    Sig: Take 1 tablet (100 mg total) by mouth in the morning and at bedtime.    Dispense:  60 tablet    Refill:  3  . fluticasone (FLONASE) 50 MCG/ACT nasal spray    Sig: Place 2 sprays into both nostrils daily.    Dispense:  16 g    Refill:  1  . cetirizine (ZYRTEC) 10 MG tablet    Sig: Take 1 tablet (10 mg total) by mouth daily.    Dispense:  30 tablet    Refill:  1  . cloNIDine (CATAPRES) tablet 0.1 mg    Follow-up: Return in about 1 month (around 11/28/2020) for Hypertension.       Charlott Rakes, MD, FAAFP. [facility] and [place], [place]   10/29/2020, 2:54 PM

## 2020-10-29 NOTE — Patient Instructions (Signed)

## 2020-10-30 LAB — CMP14+EGFR
ALT: 20 IU/L (ref 0–32)
AST: 33 IU/L (ref 0–40)
Albumin/Globulin Ratio: 1.8 (ref 1.2–2.2)
Albumin: 4.5 g/dL (ref 3.8–4.9)
Alkaline Phosphatase: 60 IU/L (ref 44–121)
BUN/Creatinine Ratio: 14 (ref 12–28)
BUN: 30 mg/dL — ABNORMAL HIGH (ref 8–27)
Bilirubin Total: 0.2 mg/dL (ref 0.0–1.2)
CO2: 20 mmol/L (ref 20–29)
Calcium: 9.2 mg/dL (ref 8.7–10.3)
Chloride: 108 mmol/L — ABNORMAL HIGH (ref 96–106)
Creatinine, Ser: 2.21 mg/dL — ABNORMAL HIGH (ref 0.57–1.00)
GFR calc Af Amer: 27 mL/min/{1.73_m2} — ABNORMAL LOW (ref >59)
GFR calc non Af Amer: 24 mL/min/{1.73_m2} — ABNORMAL LOW (ref >59)
Globulin, Total: 2.5 g/dL (ref 1.5–4.5)
Glucose: 156 mg/dL — ABNORMAL HIGH (ref 65–99)
Potassium: 5.3 mmol/L — ABNORMAL HIGH (ref 3.5–5.2)
Sodium: 139 mmol/L (ref 134–144)
Total Protein: 7 g/dL (ref 6.0–8.5)

## 2020-10-30 LAB — MICROALBUMIN / CREATININE URINE RATIO
Creatinine, Urine: 119.8 mg/dL
Microalb/Creat Ratio: 39 mg/g creat — ABNORMAL HIGH (ref 0–29)
Microalbumin, Urine: 46.8 ug/mL

## 2020-10-30 LAB — TSH: TSH: 17.7 u[IU]/mL — ABNORMAL HIGH (ref 0.450–4.500)

## 2020-10-30 LAB — T4, FREE: Free T4: 0.51 ng/dL — ABNORMAL LOW (ref 0.82–1.77)

## 2020-10-31 ENCOUNTER — Telehealth: Payer: Self-pay

## 2020-10-31 ENCOUNTER — Other Ambulatory Visit: Payer: Self-pay | Admitting: Family Medicine

## 2020-10-31 DIAGNOSIS — I129 Hypertensive chronic kidney disease with stage 1 through stage 4 chronic kidney disease, or unspecified chronic kidney disease: Secondary | ICD-10-CM

## 2020-10-31 NOTE — Telephone Encounter (Signed)
Patient was called and a voicemail was left informing patient to return phone call for lab results. 

## 2020-10-31 NOTE — Telephone Encounter (Signed)
-----   Message from Charlott Rakes, MD sent at 10/31/2020  8:54 AM EST ----- Thyroid lab is abnormal but due to the fact that she has been out of her medication I will make no regimen changes at this time but encouraged her to continue with her current dose.  Kidney function is abnormal I have referred her to nephrology for this.  Potassium is also slightly elevated which could be due to kidney function.  I will repeat at her next visit.

## 2020-11-20 ENCOUNTER — Encounter (HOSPITAL_COMMUNITY): Payer: Self-pay | Admitting: Emergency Medicine

## 2020-11-20 ENCOUNTER — Encounter: Payer: Self-pay | Admitting: Endocrinology

## 2020-11-20 ENCOUNTER — Inpatient Hospital Stay (HOSPITAL_COMMUNITY)
Admission: EM | Admit: 2020-11-20 | Discharge: 2020-11-24 | DRG: 291 | Disposition: A | Discharge status: Home / Self Care | Payer: Self-pay | Attending: Internal Medicine | Admitting: Internal Medicine

## 2020-11-20 ENCOUNTER — Emergency Department (HOSPITAL_COMMUNITY): Payer: Self-pay

## 2020-11-20 DIAGNOSIS — Z20822 Contact with and (suspected) exposure to covid-19: Secondary | ICD-10-CM | POA: Diagnosis present

## 2020-11-20 DIAGNOSIS — E871 Hypo-osmolality and hyponatremia: Secondary | ICD-10-CM | POA: Diagnosis present

## 2020-11-20 DIAGNOSIS — J9601 Acute respiratory failure with hypoxia: Secondary | ICD-10-CM | POA: Diagnosis present

## 2020-11-20 DIAGNOSIS — Z79899 Other long term (current) drug therapy: Secondary | ICD-10-CM

## 2020-11-20 DIAGNOSIS — I509 Heart failure, unspecified: Secondary | ICD-10-CM

## 2020-11-20 DIAGNOSIS — I130 Hypertensive heart and chronic kidney disease with heart failure and stage 1 through stage 4 chronic kidney disease, or unspecified chronic kidney disease: Principal | ICD-10-CM | POA: Diagnosis present

## 2020-11-20 DIAGNOSIS — R0602 Shortness of breath: Secondary | ICD-10-CM

## 2020-11-20 DIAGNOSIS — Z8249 Family history of ischemic heart disease and other diseases of the circulatory system: Secondary | ICD-10-CM

## 2020-11-20 DIAGNOSIS — J189 Pneumonia, unspecified organism: Secondary | ICD-10-CM | POA: Diagnosis present

## 2020-11-20 DIAGNOSIS — D631 Anemia in chronic kidney disease: Secondary | ICD-10-CM | POA: Diagnosis present

## 2020-11-20 DIAGNOSIS — Z7982 Long term (current) use of aspirin: Secondary | ICD-10-CM

## 2020-11-20 DIAGNOSIS — E611 Iron deficiency: Secondary | ICD-10-CM | POA: Diagnosis present

## 2020-11-20 DIAGNOSIS — K5900 Constipation, unspecified: Secondary | ICD-10-CM | POA: Diagnosis present

## 2020-11-20 DIAGNOSIS — E2740 Unspecified adrenocortical insufficiency: Secondary | ICD-10-CM | POA: Diagnosis present

## 2020-11-20 DIAGNOSIS — E039 Hypothyroidism, unspecified: Secondary | ICD-10-CM | POA: Diagnosis present

## 2020-11-20 DIAGNOSIS — Z833 Family history of diabetes mellitus: Secondary | ICD-10-CM

## 2020-11-20 DIAGNOSIS — I10 Essential (primary) hypertension: Secondary | ICD-10-CM | POA: Diagnosis present

## 2020-11-20 DIAGNOSIS — I5033 Acute on chronic diastolic (congestive) heart failure: Secondary | ICD-10-CM | POA: Diagnosis present

## 2020-11-20 DIAGNOSIS — E1122 Type 2 diabetes mellitus with diabetic chronic kidney disease: Secondary | ICD-10-CM | POA: Diagnosis present

## 2020-11-20 DIAGNOSIS — K529 Noninfective gastroenteritis and colitis, unspecified: Secondary | ICD-10-CM | POA: Diagnosis present

## 2020-11-20 DIAGNOSIS — N184 Chronic kidney disease, stage 4 (severe): Secondary | ICD-10-CM | POA: Diagnosis present

## 2020-11-20 DIAGNOSIS — N179 Acute kidney failure, unspecified: Secondary | ICD-10-CM | POA: Diagnosis present

## 2020-11-20 DIAGNOSIS — Z79890 Hormone replacement therapy (postmenopausal): Secondary | ICD-10-CM

## 2020-11-20 DIAGNOSIS — Z885 Allergy status to narcotic agent status: Secondary | ICD-10-CM

## 2020-11-20 DIAGNOSIS — R109 Unspecified abdominal pain: Secondary | ICD-10-CM

## 2020-11-20 DIAGNOSIS — R269 Unspecified abnormalities of gait and mobility: Secondary | ICD-10-CM | POA: Diagnosis present

## 2020-11-20 LAB — COMPREHENSIVE METABOLIC PANEL
ALT: 43 U/L (ref 0–44)
AST: 34 U/L (ref 15–41)
Albumin: 4.4 g/dL (ref 3.5–5.0)
Alkaline Phosphatase: 118 U/L (ref 38–126)
Anion gap: 10 (ref 5–15)
BUN: 42 mg/dL — ABNORMAL HIGH (ref 8–23)
CO2: 18 mmol/L — ABNORMAL LOW (ref 22–32)
Calcium: 8.8 mg/dL — ABNORMAL LOW (ref 8.9–10.3)
Chloride: 106 mmol/L (ref 98–111)
Creatinine, Ser: 2.18 mg/dL — ABNORMAL HIGH (ref 0.44–1.00)
GFR, Estimated: 25 mL/min — ABNORMAL LOW (ref >60)
Glucose, Bld: 159 mg/dL — ABNORMAL HIGH (ref 70–99)
Potassium: 4.5 mmol/L (ref 3.5–5.1)
Sodium: 134 mmol/L — ABNORMAL LOW (ref 135–145)
Total Bilirubin: 0.9 mg/dL (ref 0.3–1.2)
Total Protein: 7.9 g/dL (ref 6.5–8.1)

## 2020-11-20 LAB — CBC
HCT: 25.8 % — ABNORMAL LOW (ref 36.0–46.0)
Hemoglobin: 8.2 g/dL — ABNORMAL LOW (ref 12.0–15.0)
MCH: 30.1 pg (ref 26.0–34.0)
MCHC: 31.8 g/dL (ref 30.0–36.0)
MCV: 94.9 fL (ref 80.0–100.0)
Platelets: 146 10*3/uL — ABNORMAL LOW (ref 150–400)
RBC: 2.72 MIL/uL — ABNORMAL LOW (ref 3.87–5.11)
RDW: 13.2 % (ref 11.5–15.5)
WBC: 13.2 10*3/uL — ABNORMAL HIGH (ref 4.0–10.5)
nRBC: 0 % (ref 0.0–0.2)

## 2020-11-20 LAB — LIPASE, BLOOD: Lipase: 22 U/L (ref 11–51)

## 2020-11-20 LAB — CBG MONITORING, ED
Glucose-Capillary: 152 mg/dL — ABNORMAL HIGH (ref 70–99)
Glucose-Capillary: 141 mg/dL — ABNORMAL HIGH (ref 70–99)

## 2020-11-20 LAB — LACTIC ACID, PLASMA: Lactic Acid, Venous: 1 mmol/L (ref 0.5–1.9)

## 2020-11-20 MED ORDER — FENTANYL CITRATE (PF) 100 MCG/2ML IJ SOLN
75.0000 ug | Freq: Once | INTRAMUSCULAR | Status: AC
  Administered 2020-11-20: 75 ug via INTRAVENOUS
  Filled 2020-11-20: qty 2

## 2020-11-20 MED ORDER — FUROSEMIDE 10 MG/ML IJ SOLN
20.0000 mg | Freq: Once | INTRAMUSCULAR | Status: AC
  Administered 2020-11-21: 20 mg via INTRAVENOUS
  Filled 2020-11-20: qty 4

## 2020-11-20 MED ORDER — HYDROCORTISONE NA SUCCINATE PF 100 MG IJ SOLR
100.0000 mg | Freq: Once | INTRAMUSCULAR | Status: AC
  Administered 2020-11-21: 100 mg via INTRAVENOUS
  Filled 2020-11-20: qty 2

## 2020-11-20 MED ORDER — DICYCLOMINE HCL 20 MG PO TABS: 20.0000 mg | ORAL_TABLET | Freq: Two times a day (BID) | ORAL | 0 refills | Status: DC

## 2020-11-20 MED ORDER — SODIUM CHLORIDE 0.9 % IV BOLUS
500.0000 mL | Freq: Once | INTRAVENOUS | Status: AC
  Administered 2020-11-20: 500 mL via INTRAVENOUS

## 2020-11-20 MED ORDER — ONDANSETRON 4 MG PO TBDP: 4.0000 mg | ORAL_TABLET | Freq: Three times a day (TID) | ORAL | 0 refills | Status: DC | PRN

## 2020-11-20 MED ORDER — ONDANSETRON HCL 4 MG/2ML IJ SOLN
4.0000 mg | Freq: Once | INTRAMUSCULAR | Status: AC
  Administered 2020-11-20: 4 mg via INTRAVENOUS
  Filled 2020-11-20: qty 2

## 2020-11-20 MED ORDER — POLYETHYLENE GLYCOL 3350 17 G PO PACK: 17.0000 g | PACK | Freq: Every day | ORAL | 1 refills | Status: DC

## 2020-11-20 NOTE — ED Provider Notes (Addendum)
[facility] Emergency Department Provider Note MRN:  [id number]  Arrival date & time: 11/20/20     Chief Complaint   Constipation, Headache, and Fatigue   History of Present Illness   [name] is a 59 y.o. year-old female with a history of hypertension, diabetes presenting to the ED with chief complaint of constipation.  3 days with no bowel movement. Endorsing moderate to severe diffuse abdominal pain and bloating. Unsure if she is passing gas. Endorsing nausea but no vomiting. Also endorsing dull frontal headache, fatigue, malaise.  Review of Systems  A complete 10 system review of systems was obtained and all systems are negative except as noted in the HPI and PMH.   Patient's Health History    Past Medical History:  Diagnosis Date  . Diabetes mellitus without complication ([place])   . Hypertension   . Thyroid disease     Past Surgical History:  Procedure Laterality Date  . CESAREAN SECTION      Family History  Problem Relation Age of Onset  . Diabetes Mother   . Hyperlipidemia Father   . Hypertension Father     Social History   Socioeconomic History  . Marital status: Single    Spouse name: Not on file  . Number of children: Not on file  . Years of education: Not on file  . Highest education level: Not on file  Occupational History  . Not on file  Tobacco Use  . Smoking status: Never Smoker  . Smokeless tobacco: Never Used  Substance and Sexual Activity  . Alcohol use: No  . Drug use: No  . Sexual activity: Not on file  Other Topics Concern  . Not on file  Social History Narrative  . Not on file   Social Determinants of Health   Financial Resource Strain:   . Difficulty of Paying Living Expenses: Not on file  Food Insecurity:   . Worried About Charity fundraiser in the Last Year: Not on file  . Ran Out of Food in the Last Year: Not on file  Transportation Needs:   . Lack of Transportation (Medical): Not on file  .  Lack of Transportation (Non-Medical): Not on file  Physical Activity:   . Days of Exercise per Week: Not on file  . Minutes of Exercise per Session: Not on file  Stress:   . Feeling of Stress : Not on file  Social Connections:   . Frequency of Communication with Friends and Family: Not on file  . Frequency of Social Gatherings with Friends and Family: Not on file  . Attends Religious Services: Not on file  . Active Member of Clubs or Organizations: Not on file  . Attends Archivist Meetings: Not on file  . Marital Status: Not on file  Intimate Partner Violence:   . Fear of Current or Ex-Partner: Not on file  . Emotionally Abused: Not on file  . Physically Abused: Not on file  . Sexually Abused: Not on file     Physical Exam   Vitals:   11/20/20 2230 11/20/20 2330  BP: (!) 181/62 (!) 175/68  Pulse: 84 83  Resp: 18 18  Temp:    SpO2: 92% (!) 89%    CONSTITUTIONAL: Chronically ill-appearing, NAD NEURO:  Alert and oriented x 3, no focal deficits EYES:  eyes equal and reactive ENT/NECK:  no LAD, no JVD CARDIO: Regular rate, well-perfused, normal S1 and S2 PULM:  CTAB no wheezing or rhonchi GI/GU:  normal bowel sounds, non-distended, mild diffuse abdominal tenderness MSK/SPINE:  No gross deformities, no edema SKIN:  no rash, atraumatic PSYCH:  Appropriate speech and behavior  *Additional and/or pertinent findings included in MDM below  Diagnostic and Interventional Summary    EKG Interpretation  Date/Time:  Thursday November 20 2020 23:44:52 EST Ventricular Rate:  83 PR Interval:    QRS Duration: 97 QT Interval:  358 QTC Calculation: 421 R Axis:   -28 Text Interpretation: Sinus rhythm Borderline left axis deviation Nonspecific T abnrm, anterolateral leads Confirmed by Gerlene Fee [phone]) on 11/20/2020 11:50:17 PM      Labs Reviewed  CBC - Abnormal; Notable for the following components:      Result Value   WBC 13.2 (*)    RBC 2.72 (*)    Hemoglobin  8.2 (*)    HCT 25.8 (*)    Platelets 146 (*)    All other components within normal limits  COMPREHENSIVE METABOLIC PANEL - Abnormal; Notable for the following components:   Sodium 134 (*)    CO2 18 (*)    Glucose, Bld 159 (*)    BUN 42 (*)    Creatinine, Ser 2.18 (*)    Calcium 8.8 (*)    GFR, Estimated 25 (*)    All other components within normal limits  CBG MONITORING, ED - Abnormal; Notable for the following components:   Glucose-Capillary 152 (*)    All other components within normal limits  CBG MONITORING, ED - Abnormal; Notable for the following components:   Glucose-Capillary 141 (*)    All other components within normal limits  RESP PANEL BY RT-PCR (FLU A&B, COVID) ARPGX2  LIPASE, BLOOD  LACTIC ACID, PLASMA  BRAIN NATRIURETIC PEPTIDE  D-DIMER, QUANTITATIVE (NOT AT [facility])  TROPONIN I (HIGH SENSITIVITY)    DG Chest Port 1 View  Final Result    CT HEAD WO CONTRAST  Final Result    CT ABDOMEN PELVIS WO CONTRAST  Final Result      Medications  hydrocortisone sodium succinate (SOLU-CORTEF) 100 MG injection 100 mg (has no administration in time range)  furosemide (LASIX) injection 20 mg (has no administration in time range)  ondansetron (ZOFRAN) injection 4 mg (4 mg Intravenous Given 11/20/20 2143)  sodium chloride 0.9 % bolus 500 mL (0 mLs Intravenous Stopped 11/20/20 2215)  fentaNYL (SUBLIMAZE) injection 75 mcg (75 mcg Intravenous Given 11/20/20 2143)     Procedures  /  Critical Care .Critical Care Performed by: Maudie Flakes, MD Authorized by: Maudie Flakes, MD   Critical care provider statement:    Critical care time (minutes):  32   Critical care was necessary to treat or prevent imminent or life-threatening deterioration of the following conditions:  Respiratory failure   Critical care was time spent personally by me on the following activities:  Discussions with consultants, evaluation of patient's response to treatment, examination of patient, ordering and  performing treatments and interventions, ordering and review of laboratory studies, ordering and review of radiographic studies, pulse oximetry, re-evaluation of patient's condition, obtaining history from patient or surrogate and review of old charts Ultrasound ED Echo  Date/Time: 11/21/2020 12:10 AM Performed by: Maudie Flakes, MD Authorized by: Maudie Flakes, MD   Procedure details:    Indications: dyspnea     Views: subxiphoid, parasternal long axis view, parasternal short axis view and apical 4 chamber view     Images: not archived   Findings:    Pericardium: no pericardial effusion  RV Diameter: normal      ED Course and Medical Decision Making  I have reviewed the triage vital signs, the nursing notes, and pertinent available records from the EMR.  Listed above are laboratory and imaging tests that I personally ordered, reviewed, and interpreted and then considered in my medical decision making (see below for details).  Constipation, diffuse abdominal tenderness, unsure if passing gas. Patient initially stated that she felt she was impacted and so a fecal disimpaction was attempted but there was no stool in the vault. Given her writhing in pain will CT to exclude obstructive process.     Work-up reveals a mild enteritis on CT abdomen.  CT head is unchanged.  Patient is feeling better, abdomen benign, tolerating p.o. patient was briefly put on 2 L nasal cannula for some mild hypoxia that was thought to be in relation to the dose of fentanyl provided.  Her hypoxia seems to be worsening despite receiving no further opioids for the past 2 hours.  On my assessment she is now 70% on room air.  She is taking rapid shallow breaths due to abdominal discomfort.  She denies any chest pain or shortness of breath.  She has no leg pain or swelling.  Considering COVID-19, pneumonia, PE.  Will obtain further testing here in the ED and then likely admit.  Signed out to oncoming provider at shift  change.  Barth Kirks. [place] Small, [place] [email or web address]  Final Clinical Impressions(s) / ED Diagnoses     ICD-10-CM   1. Enteritis  K52.9   2. Constipation, unspecified constipation type  K59.00   3. Acute respiratory failure with hypoxia [facility])  J96.01       Discharge Instructions Discussed with and Provided to Patient:   Discharge Instructions   None       Maudie Flakes, MD 11/20/20 2320    Maudie Flakes, MD 11/20/20 2354    Maudie Flakes, MD 11/21/20 [phone]

## 2020-11-20 NOTE — ED Triage Notes (Signed)
Pt states she is unable to have a BM. LBM was 3 days ago. States she used laxatives and enema with no relief. Denies issues urinating. Also c/o HA and malaise x 3 days.

## 2020-11-21 ENCOUNTER — Inpatient Hospital Stay (HOSPITAL_COMMUNITY): Payer: Self-pay

## 2020-11-21 ENCOUNTER — Encounter (HOSPITAL_COMMUNITY): Payer: Self-pay | Admitting: Internal Medicine

## 2020-11-21 DIAGNOSIS — J9601 Acute respiratory failure with hypoxia: Secondary | ICD-10-CM

## 2020-11-21 DIAGNOSIS — I509 Heart failure, unspecified: Secondary | ICD-10-CM

## 2020-11-21 DIAGNOSIS — R7989 Other specified abnormal findings of blood chemistry: Secondary | ICD-10-CM

## 2020-11-21 DIAGNOSIS — R609 Edema, unspecified: Secondary | ICD-10-CM

## 2020-11-21 DIAGNOSIS — K5900 Constipation, unspecified: Secondary | ICD-10-CM

## 2020-11-21 DIAGNOSIS — E2740 Unspecified adrenocortical insufficiency: Secondary | ICD-10-CM

## 2020-11-21 DIAGNOSIS — N184 Chronic kidney disease, stage 4 (severe): Secondary | ICD-10-CM

## 2020-11-21 DIAGNOSIS — M7989 Other specified soft tissue disorders: Secondary | ICD-10-CM

## 2020-11-21 DIAGNOSIS — I5031 Acute diastolic (congestive) heart failure: Secondary | ICD-10-CM

## 2020-11-21 DIAGNOSIS — I10 Essential (primary) hypertension: Secondary | ICD-10-CM | POA: Diagnosis present

## 2020-11-21 LAB — CBC WITH DIFFERENTIAL/PLATELET
Abs Immature Granulocytes: 0.07 10*3/uL (ref 0.00–0.07)
Basophils Absolute: 0 10*3/uL (ref 0.0–0.1)
Basophils Relative: 0 %
Eosinophils Absolute: 0 10*3/uL (ref 0.0–0.5)
Eosinophils Relative: 0 %
HCT: 22.8 % — ABNORMAL LOW (ref 36.0–46.0)
Hemoglobin: 7.1 g/dL — ABNORMAL LOW (ref 12.0–15.0)
Immature Granulocytes: 1 %
Lymphocytes Relative: 7 %
Lymphs Abs: 1 10*3/uL (ref 0.7–4.0)
MCH: 30 pg (ref 26.0–34.0)
MCHC: 31.1 g/dL (ref 30.0–36.0)
MCV: 96.2 fL (ref 80.0–100.0)
Monocytes Absolute: 0.5 10*3/uL (ref 0.1–1.0)
Monocytes Relative: 4 %
Neutro Abs: 11.8 10*3/uL — ABNORMAL HIGH (ref 1.7–7.7)
Neutrophils Relative %: 88 %
Platelets: 139 10*3/uL — ABNORMAL LOW (ref 150–400)
RBC: 2.37 MIL/uL — ABNORMAL LOW (ref 3.87–5.11)
RDW: 13.3 % (ref 11.5–15.5)
WBC: 13.3 10*3/uL — ABNORMAL HIGH (ref 4.0–10.5)
nRBC: 0 % (ref 0.0–0.2)

## 2020-11-21 LAB — COMPREHENSIVE METABOLIC PANEL
ALT: 34 U/L (ref 0–44)
AST: 29 U/L (ref 15–41)
Albumin: 3.7 g/dL (ref 3.5–5.0)
Alkaline Phosphatase: 103 U/L (ref 38–126)
Anion gap: 12 (ref 5–15)
BUN: 44 mg/dL — ABNORMAL HIGH (ref 8–23)
CO2: 19 mmol/L — ABNORMAL LOW (ref 22–32)
Calcium: 8.1 mg/dL — ABNORMAL LOW (ref 8.9–10.3)
Chloride: 104 mmol/L (ref 98–111)
Creatinine, Ser: 2.52 mg/dL — ABNORMAL HIGH (ref 0.44–1.00)
GFR, Estimated: 21 mL/min — ABNORMAL LOW (ref >60)
Glucose, Bld: 144 mg/dL — ABNORMAL HIGH (ref 70–99)
Potassium: 4 mmol/L (ref 3.5–5.1)
Sodium: 135 mmol/L (ref 135–145)
Total Bilirubin: 0.8 mg/dL (ref 0.3–1.2)
Total Protein: 6.9 g/dL (ref 6.5–8.1)

## 2020-11-21 LAB — D-DIMER, QUANTITATIVE: D-Dimer, Quant: 1.83 ug/mL-FEU — ABNORMAL HIGH (ref 0.00–0.50)

## 2020-11-21 LAB — ECHOCARDIOGRAM COMPLETE
Area-P 1/2: 3.42 cm2
S' Lateral: 2.8 cm

## 2020-11-21 LAB — MAGNESIUM: Magnesium: 2.1 mg/dL (ref 1.7–2.4)

## 2020-11-21 LAB — BRAIN NATRIURETIC PEPTIDE: B Natriuretic Peptide: 704.5 pg/mL — ABNORMAL HIGH (ref 0.0–100.0)

## 2020-11-21 LAB — CBG MONITORING, ED: Glucose-Capillary: 262 mg/dL — ABNORMAL HIGH (ref 70–99)

## 2020-11-21 LAB — RESP PANEL BY RT-PCR (FLU A&B, COVID) ARPGX2
Influenza A by PCR: NEGATIVE
Influenza B by PCR: NEGATIVE
SARS Coronavirus 2 by RT PCR: NEGATIVE

## 2020-11-21 LAB — TROPONIN I (HIGH SENSITIVITY)
Troponin I (High Sensitivity): 12 ng/L (ref <18)
Troponin I (High Sensitivity): 5 ng/L (ref <18)

## 2020-11-21 LAB — HIV ANTIBODY (ROUTINE TESTING W REFLEX): HIV Screen 4th Generation wRfx: NONREACTIVE

## 2020-11-21 LAB — PHOSPHORUS: Phosphorus: 4.1 mg/dL (ref 2.5–4.6)

## 2020-11-21 LAB — LACTIC ACID, PLASMA: Lactic Acid, Venous: 0.9 mmol/L (ref 0.5–1.9)

## 2020-11-21 MED ORDER — FUROSEMIDE 10 MG/ML IJ SOLN
40.0000 mg | Freq: Once | INTRAMUSCULAR | Status: AC
  Administered 2020-11-21: 40 mg via INTRAVENOUS
  Filled 2020-11-21: qty 4

## 2020-11-21 MED ORDER — HYDROCORTISONE 10 MG PO TABS
10.0000 mg | ORAL_TABLET | Freq: Two times a day (BID) | ORAL | Status: DC
  Administered 2020-11-21 – 2020-11-24 (×7): 10 mg via ORAL
  Filled 2020-11-21 (×8): qty 1

## 2020-11-21 MED ORDER — ENOXAPARIN SODIUM 40 MG/0.4ML ~~LOC~~ SOLN
40.0000 mg | SUBCUTANEOUS | Status: DC
  Administered 2020-11-21: 40 mg via SUBCUTANEOUS
  Filled 2020-11-21: qty 0.4

## 2020-11-21 MED ORDER — AMLODIPINE BESYLATE 10 MG PO TABS
10.0000 mg | ORAL_TABLET | Freq: Every day | ORAL | Status: DC
  Administered 2020-11-21 – 2020-11-24 (×4): 10 mg via ORAL
  Filled 2020-11-21 (×2): qty 1
  Filled 2020-11-21: qty 2
  Filled 2020-11-21: qty 1

## 2020-11-21 MED ORDER — FUROSEMIDE 10 MG/ML IJ SOLN: 40.0000 mg | Freq: Once | INTRAMUSCULAR | Status: DC

## 2020-11-21 MED ORDER — FENTANYL CITRATE (PF) 100 MCG/2ML IJ SOLN
25.0000 ug | INTRAMUSCULAR | Status: DC | PRN
  Administered 2020-11-21 – 2020-11-23 (×2): 50 ug via INTRAVENOUS
  Filled 2020-11-21 (×2): qty 2

## 2020-11-21 MED ORDER — ACETAMINOPHEN 325 MG PO TABS
650.0000 mg | ORAL_TABLET | ORAL | Status: DC | PRN
  Administered 2020-11-22 – 2020-11-23 (×4): 650 mg via ORAL
  Filled 2020-11-21 (×4): qty 2

## 2020-11-21 MED ORDER — SODIUM CHLORIDE 0.9% FLUSH: 3.0000 mL | INTRAVENOUS | Status: DC | PRN

## 2020-11-21 MED ORDER — ASPIRIN 81 MG PO CHEW
81.0000 mg | CHEWABLE_TABLET | Freq: Every day | ORAL | Status: DC
  Administered 2020-11-21 – 2020-11-24 (×4): 81 mg via ORAL
  Filled 2020-11-21 (×4): qty 1

## 2020-11-21 MED ORDER — TECHNETIUM TO 99M ALBUMIN AGGREGATED
4.1000 | Freq: Once | INTRAVENOUS | Status: AC
  Administered 2020-11-21: 4.1 via INTRAVENOUS

## 2020-11-21 MED ORDER — ONDANSETRON HCL 4 MG/2ML IJ SOLN
4.0000 mg | Freq: Four times a day (QID) | INTRAMUSCULAR | Status: DC | PRN
  Administered 2020-11-21: 4 mg via INTRAVENOUS
  Filled 2020-11-21: qty 2

## 2020-11-21 MED ORDER — ADULT MULTIVITAMIN W/MINERALS CH
1.0000 | ORAL_TABLET | Freq: Every day | ORAL | Status: DC
  Administered 2020-11-21 – 2020-11-24 (×4): 1 via ORAL
  Filled 2020-11-21 (×4): qty 1

## 2020-11-21 MED ORDER — FLUTICASONE PROPIONATE 50 MCG/ACT NA SUSP
2.0000 | Freq: Every day | NASAL | Status: DC
  Administered 2020-11-21: 2 via NASAL
  Filled 2020-11-21 (×2): qty 16

## 2020-11-21 MED ORDER — SENNOSIDES-DOCUSATE SODIUM 8.6-50 MG PO TABS
1.0000 | ORAL_TABLET | Freq: Two times a day (BID) | ORAL | Status: DC
  Administered 2020-11-21 – 2020-11-24 (×7): 1 via ORAL
  Filled 2020-11-21 (×7): qty 1

## 2020-11-21 MED ORDER — HYDROCORTISONE NA SUCCINATE PF 100 MG IJ SOLR: 50.0000 mg | Freq: Three times a day (TID) | INTRAMUSCULAR | Status: DC

## 2020-11-21 MED ORDER — LEVOTHYROXINE SODIUM 50 MCG PO TABS: 150.0000 ug | ORAL_TABLET | Freq: Every day | ORAL | Status: DC

## 2020-11-21 MED ORDER — LABETALOL HCL 5 MG/ML IV SOLN: 10.0000 mg | INTRAVENOUS | Status: DC | PRN

## 2020-11-21 MED ORDER — SODIUM CHLORIDE 0.9% FLUSH
3.0000 mL | Freq: Two times a day (BID) | INTRAVENOUS | Status: DC
  Administered 2020-11-21 – 2020-11-24 (×6): 3 mL via INTRAVENOUS

## 2020-11-21 MED ORDER — HYDRALAZINE HCL 50 MG PO TABS
100.0000 mg | ORAL_TABLET | Freq: Three times a day (TID) | ORAL | Status: DC
  Administered 2020-11-21 – 2020-11-24 (×10): 100 mg via ORAL
  Filled 2020-11-21 (×12): qty 2

## 2020-11-21 MED ORDER — SODIUM CHLORIDE 0.9 % IV SOLN: 250.0000 mL | INTRAVENOUS | Status: DC | PRN

## 2020-11-21 MED ORDER — POLYETHYLENE GLYCOL 3350 17 G PO PACK: 17.0000 g | PACK | Freq: Every day | ORAL | Status: DC | PRN

## 2020-11-21 MED ORDER — LEVOTHYROXINE SODIUM 75 MCG PO TABS
150.0000 ug | ORAL_TABLET | Freq: Every day | ORAL | Status: DC
  Administered 2020-11-21 – 2020-11-24 (×4): 150 ug via ORAL
  Filled 2020-11-21 (×3): qty 2
  Filled 2020-11-21: qty 1

## 2020-11-21 MED ORDER — ENOXAPARIN SODIUM 30 MG/0.3ML ~~LOC~~ SOLN
30.0000 mg | SUBCUTANEOUS | Status: DC
  Administered 2020-11-22: 30 mg via SUBCUTANEOUS
  Filled 2020-11-21: qty 0.3

## 2020-11-21 MED ORDER — POLYETHYLENE GLYCOL 3350 17 G PO PACK
17.0000 g | PACK | Freq: Two times a day (BID) | ORAL | Status: DC
  Administered 2020-11-21 – 2020-11-24 (×7): 17 g via ORAL
  Filled 2020-11-21 (×7): qty 1

## 2020-11-21 NOTE — H&P (Addendum)
History and Physical    [name] JJO:[id number] DOB: 07-30-1961 DOA: 11/20/2020  PCP: Charlott Rakes, MD  Patient coming from: Home  I have personally briefly reviewed patient's old medical records in [place]  Chief Complaint: Abd pain  HPI: [name] is a 59 y.o. female with medical history significant of HTN, CKD3-4, adrenal insufficiency on cortef, hypothyroidism.  Pt presents to the ED with c/o 3 day h/o severe, diffuse abd pain and bloating.  3 days with no BM.  Having nausea but no vomiting.  Has headache and fatigue.  No CP.  Has just started having SOB since presentation to ED.  No fevers, chills.   ED Course: BP 180-190 over 50-60.  WBC 13.2k  HGB 8.2 (has chronic anemia) Creat 2.1 (baseline).  CT abd/pelvis just shows mild enteritis.  Trop neg  Lactate 1.0  Given 500cc NS bolus.  While in ED she develops new O2 requirement and CXR shows pulmonary edema.  BNP 700.  Given 20mg  IV lasix and 100mg  solucortef.  Hospitalist asked to admit.   Review of Systems: As per HPI, otherwise all review of systems negative.  Past Medical History:  Diagnosis Date  . Diabetes mellitus without complication ([place])   . Hypertension   . Thyroid disease     Past Surgical History:  Procedure Laterality Date  . CESAREAN SECTION       reports that she has never smoked. She has never used smokeless tobacco. She reports that she does not drink alcohol and does not use drugs.  Allergies  Allergen Reactions  . Percocet [Oxycodone-Acetaminophen] Rash    Family History  Problem Relation Age of Onset  . Diabetes Mother   . Hyperlipidemia Father   . Hypertension Father      Prior to Admission medications   Medication Sig Start Date End Date Taking? Authorizing Provider  acetaminophen (TYLENOL) 325 MG tablet Take 2 tablets (650 mg total) by mouth every 6 (six) hours as needed for mild pain. 03/31/18  Yes Dixie Dials, MD  amLODipine (NORVASC) 10  MG tablet Take 1 tablet (10 mg total) by mouth daily. 10/29/20  Yes Charlott Rakes, MD  aspirin 81 MG chewable tablet Chew 81 mg by mouth daily.   Yes [provider]  Calcium Carbonate-Vit D-Min (CALCIUM 1200 PO) Take 1,200 mg by mouth daily.   Yes [provider]  fluticasone (FLONASE) 50 MCG/ACT nasal spray Place 2 sprays into both nostrils daily. 10/29/20  Yes Charlott Rakes, MD  hydrALAZINE (APRESOLINE) 100 MG tablet Take 1 tablet (100 mg total) by mouth in the morning and at bedtime. 10/29/20  Yes Charlott Rakes, MD  hydrocortisone (CORTEF) 10 MG tablet Take 10 mg by mouth 2 (two) times daily.   Yes [provider]  ibuprofen (ADVIL) 600 MG tablet Take 1 tablet (600 mg total) by mouth every 6 (six) hours as needed. Patient taking differently: Take 600 mg by mouth every 6 (six) hours as needed for moderate pain.  01/28/20  Yes Charlann Lange, PA-C  levothyroxine (SYNTHROID) 150 MCG tablet Take 1 tablet (150 mcg total) by mouth daily before breakfast. 10/29/20  Yes Newlin, Enobong, MD  Multiple Vitamin (MULTIVITAMIN WITH MINERALS) TABS tablet Take 1 tablet by mouth daily.   Yes [provider]  polyethylene glycol (MIRALAX / GLYCOLAX) 17 g packet Take 17 g by mouth daily as needed.   Yes [provider]  dicyclomine (BENTYL) 20 MG tablet Take 1 tablet (20 mg total) by mouth 2 (  two) times daily. 11/20/20   Maudie Flakes, MD  ondansetron (ZOFRAN ODT) 4 MG disintegrating tablet Take 1 tablet (4 mg total) by mouth every 8 (eight) hours as needed for nausea or vomiting. 11/20/20   Maudie Flakes, MD  polyethylene glycol (MIRALAX) 17 g packet Take 17 g by mouth daily. 11/20/20   Maudie Flakes, MD    Physical Exam: Vitals:   11/21/20 0045 11/21/20 0100 11/21/20 0115 11/21/20 0145  BP: (!) 162/59 (!) 177/60 (!) 184/57 (!) 183/61  Pulse: 74 76 76 79  Resp: 19 (!) 29 (!) 25 (!) 32  Temp:      TempSrc:      SpO2: 95% 94% 95% 95%    Constitutional:  NAD, calm, Uncomfortable with abd pain Eyes: PERRL, lids and conjunctivae normal ENMT: Mucous membranes are moist. Posterior pharynx clear of any exudate or lesions.Normal dentition.  Neck: normal, supple, no masses, no thyromegaly Respiratory: clear to auscultation bilaterally, no wheezing, no crackles. Normal respiratory effort. No accessory muscle use.  Cardiovascular: Regular rate and rhythm, no murmurs / rubs / gallops. No extremity edema. 2+ pedal pulses. No carotid bruits.  Abdomen: Diffuse TTP. Musculoskeletal: no clubbing / cyanosis. No joint deformity upper and lower extremities. Good ROM, no contractures. Normal muscle tone.  Skin: no rashes, lesions, ulcers. No induration Neurologic: CN 2-12 grossly intact. Sensation intact, DTR normal. Strength 5/5 in all 4.  Psychiatric: Normal judgment and insight. Alert and oriented x 3. Normal mood.    Labs on Admission: I have personally reviewed following labs and imaging studies  CBC: Recent Labs  Lab 11/20/20 2144  WBC 13.2*  HGB 8.2*  HCT 25.8*  MCV 94.9  PLT 270*   Basic Metabolic Panel: Recent Labs  Lab 11/20/20 2144  NA 134*  K 4.5  CL 106  CO2 18*  GLUCOSE 159*  BUN 42*  CREATININE 2.18*  CALCIUM 8.8*   GFR: CrCl cannot be calculated (Unknown ideal weight.). Liver Function Tests: Recent Labs  Lab 11/20/20 2144  AST 34  ALT 43  ALKPHOS 118  BILITOT 0.9  PROT 7.9  ALBUMIN 4.4   Recent Labs  Lab 11/20/20 2144  LIPASE 22   No results for input(s): AMMONIA in the last 168 hours. Coagulation Profile: No results for input(s): INR, PROTIME in the last 168 hours. Cardiac Enzymes: No results for input(s): CKTOTAL, CKMB, CKMBINDEX, TROPONINI in the last 168 hours. BNP (last 3 results) No results for input(s): PROBNP in the last 8760 hours. HbA1C: No results for input(s): HGBA1C in the last 72 hours. CBG: Recent Labs  Lab 11/20/20 1928 11/20/20 2121  GLUCAP 152* 141*   Lipid Profile: No results  for input(s): CHOL, HDL, LDLCALC, TRIG, CHOLHDL, LDLDIRECT in the last 72 hours. Thyroid Function Tests: No results for input(s): TSH, T4TOTAL, FREET4, T3FREE, THYROIDAB in the last 72 hours. Anemia Panel: No results for input(s): VITAMINB12, FOLATE, FERRITIN, TIBC, IRON, RETICCTPCT in the last 72 hours. Urine analysis:    Component Value Date/Time   COLORURINE YELLOW 03/27/2018 0424   APPEARANCEUR CLEAR 03/27/2018 0424   LABSPEC 1.017 03/27/2018 0424   PHURINE 5.0 03/27/2018 0424   GLUCOSEU NEGATIVE 03/27/2018 0424   HGBUR SMALL (A) 03/27/2018 0424   BILIRUBINUR NEGATIVE 03/27/2018 0424   KETONESUR NEGATIVE 03/27/2018 0424   PROTEINUR NEGATIVE 03/27/2018 0424   UROBILINOGEN 0.2 06/12/2011 1904   NITRITE NEGATIVE 03/27/2018 0424   LEUKOCYTESUR MODERATE (A) 03/27/2018 0424    Radiological Exams on Admission: CT ABDOMEN  PELVIS WO CONTRAST  Result Date: 11/20/2020 CLINICAL DATA:  Acute abdominal pain and history suggestive of constipation EXAM: CT ABDOMEN AND PELVIS WITHOUT CONTRAST TECHNIQUE: Multidetector CT imaging of the abdomen and pelvis was performed following the standard protocol without IV contrast. COMPARISON:  12/09/2017 FINDINGS: Lower chest: Small pleural effusions are noted right greater than left with associated atelectatic changes. Hepatobiliary: No focal liver abnormality is seen. No gallstones, gallbladder wall thickening, or biliary dilatation. Pancreas: Unremarkable. No pancreatic ductal dilatation or surrounding inflammatory changes. Spleen: Normal in size without focal abnormality. Adrenals/Urinary Tract: Adrenal glands are within normal limits. Kidneys are well visualized with prominent right extrarenal pelvis. No obstructive changes are seen. The bladder is well distended. Stomach/Bowel: Mild fecal material is noted within the colon without definitive obstructive change. No findings to suggest constipation are identified. The appendix is not well visualized. No  inflammatory changes to suggest appendicitis are noted. Stomach is decompressed. A few mildly dilated loops of small bowel are noted without definitive obstructive change. This may be related to mild enteritis. Vascular/Lymphatic: Aortic atherosclerosis. No enlarged abdominal or pelvic lymph nodes. Reproductive: Uterus and bilateral adnexa are unremarkable. Other: No abdominal wall hernia or abnormality. No abdominopelvic ascites. Musculoskeletal: No acute or significant osseous findings. IMPRESSION: Bilateral pleural effusions right greater than left with associated atelectatic changes. A few mildly dilated loops of small bowel without definitive obstructive change. This likely represents a mild enteritis. No findings to suggest constipation are noted. Electronically Signed   By: Inez Catalina M.D.   On: 11/20/2020 23:06   CT HEAD WO CONTRAST  Result Date: 11/20/2020 CLINICAL DATA:  Increasing headaches EXAM: CT HEAD WITHOUT CONTRAST TECHNIQUE: Contiguous axial images were obtained from the base of the skull through the vertex without intravenous contrast. COMPARISON:  01/28/2020 FINDINGS: Brain: Ventricles are again prominent. No findings to suggest acute hemorrhage, acute infarction or space-occupying mass lesion are noted. Vascular: No hyperdense vessel or unexpected calcification. Skull: Normal. Negative for fracture or focal lesion. Sinuses/Orbits: No acute finding. Other: None. IMPRESSION: Prominent ventricular system stable from the prior exam. No new focal abnormality is noted. Electronically Signed   By: Inez Catalina M.D.   On: 11/20/2020 23:08   DG Chest Port 1 View  Result Date: 11/20/2020 CLINICAL DATA:  59 year old female with shortness of breath. EXAM: PORTABLE CHEST 1 VIEW COMPARISON:  Chest radiograph dated 03/27/2018. FINDINGS: There is cardiomegaly with vascular congestion and edema. Superimposed pneumonia is not excluded clinical correlation is recommended. No large pleural effusion or  pneumothorax. No acute osseous pathology. IMPRESSION: Cardiomegaly with findings of CHF. Superimposed pneumonia is not excluded. Electronically Signed   By: Anner Crete M.D.   On: 11/20/2020 23:46    EKG: Independently reviewed.  Lateral TWI present, but these were present on EKG in 2019.  Assessment/Plan Principal Problem:   New onset of congestive heart failure (HCC) Active Problems:   Adrenal insufficiency (HCC)   CKD (chronic kidney disease) stage 4, GFR 15-29 ml/min (HCC)   HTN (hypertension)    1. New onset CHF - With new O2 requirement. 1. HTN urgency? 2. R/o ACS by getting a repeat trop, first is neg 3. Normal RV diameter on Dr. Wende Bushy bedside US and HTN not really what id expect from PE 4. CT abd was non-contrast but no mention of aortic widening or anything to suggest dissection.  Also no chest pain at all. 5. CHF pathway 6. Lasix 20mg  IV in ED 7. 2d echo ordered 8. Repeat lactic acid 2.  HTN - 1. Trying to control BP as this may be responsible for CHF 2. Resume home hydralazine and increase to TID dosing 3. Resume home amlodipine 4. Add PRN labetalol IV 3. Adrenal insufficiency - 1. Got solucortef 100mg  in ED 2. Will just put her back on 10mg  BID for the moment 3. SBPs 180s-190s not really consistent with adrenal crisis. 4. CKD 4 - chronic and baseline  DVT prophylaxis: Lovenox Code Status: Full Family Communication: No family in room Disposition Plan: Home after CHF work up and treatment Consults called: None Admission status: Admit to inpatient  Severity of Illness: The appropriate patient status for this patient is INPATIENT. Inpatient status is judged to be reasonable and necessary in order to provide the required intensity of service to ensure the patient's safety. The patient's presenting symptoms, physical exam findings, and initial radiographic and laboratory data in the context of their chronic comorbidities is felt to place them at high risk for  further clinical deterioration. Furthermore, it is not anticipated that the patient will be medically stable for discharge from the hospital within 2 midnights of admission. The following factors support the patient status of inpatient.   IP status due to new onset CHF.   * I certify that at the point of admission it is my clinical judgment that the patient will require inpatient hospital care spanning beyond 2 midnights from the point of admission due to high intensity of service, high risk for further deterioration and high frequency of surveillance required.*    Nataleigh Griffin M. DO [facility]  How to contact the [facility] Attending or Consulting provider [place] or covering provider during after hours [place], for this patient?  1. Check the care team in [facility] and look for a) attending/consulting [facility] provider listed and b) the [facility] team listed 2. Log into [email or web address]  Amion Physician Scheduling and messaging for groups and whole hospitals  On call and physician scheduling software for group practices, residents, hospitalists and other medical providers for call, clinic, rotation and shift schedules. On[employer] is a hospital-wide system for scheduling doctors and paging doctors on call. EasyPlot is for scientific plotting and data analysis.  [email or web address]  and use [place]'s universal password to access. If you do not have the password, please contact the hospital operator.  3. Locate the [facility] provider you are looking for under [facility] and page to a number that you can be directly reached. 4. If you still have difficulty reaching the provider, please page the [facility] (Director on Call) for the Hospitalists listed on amion for assistance.  11/21/2020, 2:11 AM

## 2020-11-21 NOTE — Progress Notes (Signed)
Echocardiogram 2D Echocardiogram has been performed.  [name] 11/21/2020, 10:07 AM

## 2020-11-21 NOTE — ED Notes (Signed)
Report given to 4E Charge RN Abby

## 2020-11-21 NOTE — ED Provider Notes (Signed)
I assumed care of this patient.  Please see previous provider note for further details of Hx, PE.  Briefly patient is a 59 y.o. female who presented abd pain and headache. Work up for this reassuring, but became hypoxic. Work expanded.   Work consistent with new onset HF. Admitted to medicine for further work up and management.       Fatima Blank, MD 11/21/20 [phone]

## 2020-11-21 NOTE — Progress Notes (Signed)
Lower extremity venous has been completed.   Preliminary results in CV Proc.   Abram Sander 11/21/2020 1:19 PM

## 2020-11-21 NOTE — Progress Notes (Signed)
Care started in the emergency room and patient is admitted early this morning by Dr. Jennette Kettle and I am in current agreement with his assessment and plan.  Additional changes to the plan of care been made accordingly.  The patient is a 59 year old female with a past medical history significant for but not limited to hypertension, history of chronic kidney disease stage IIIb culture being stage IV, history of adrenal insufficiency on Cortef, hypothyroidism as well as other comorbidities who presented to the ED with a chief complaint of 3-day of history of severe diffuse abdominal pain and bloating also showed with no bowel movement for 3 days.  She also started having nausea vomiting and a headache and fatigue.  Since presented to the ED she developed shortness of breath and was placed on supplemental oxygen.  A CT of the abdomen pelvis was done and showed mild enteritis.  She is given a 500 mL bolus.  Because of her new oxygen requirement chest x-ray was obtained and shows pulmonary edema and she had a BNP was 704.5.  She is given 20 mg of IV Lasix and 100 mg of Solu-Cortef and hospitalist was asked to admit.  Patient's hemoglobin dropped to 7.1 and likely is in the setting of chronic kidney disease.  Will need to continue to monitor for signs symptoms of bleeding.  She was placed back on her hydrocortisone 10 mg p.o. twice daily and because of her concern for volume overload we will consult cardiology.  We will start the patient on a bowel regimen and schedule her MiraLAX twice daily and start senna docusate 1 tab p.o. twice daily.  Since patient has an elevated D-dimer will obtain a VQ scan given her renal function and no extremity venous duplex to rule out DVT.  We will also give another dose of IV Lasix 40 mg x 1 and monitor her volume status.  Continue to monitor patient's clinical response to intervention and repeat a chest x-ray and blood work and follow her clinical course closely and follow-up on her  lower extremity venous duplex and VQ scan.

## 2020-11-22 ENCOUNTER — Inpatient Hospital Stay (HOSPITAL_COMMUNITY): Payer: Self-pay

## 2020-11-22 ENCOUNTER — Other Ambulatory Visit: Payer: Self-pay

## 2020-11-22 DIAGNOSIS — J189 Pneumonia, unspecified organism: Secondary | ICD-10-CM

## 2020-11-22 LAB — MAGNESIUM: Magnesium: 2.2 mg/dL (ref 1.7–2.4)

## 2020-11-22 LAB — COMPREHENSIVE METABOLIC PANEL
ALT: 32 U/L (ref 0–44)
AST: 30 U/L (ref 15–41)
Albumin: 3.9 g/dL (ref 3.5–5.0)
Alkaline Phosphatase: 112 U/L (ref 38–126)
Anion gap: 11 (ref 5–15)
BUN: 51 mg/dL — ABNORMAL HIGH (ref 8–23)
CO2: 21 mmol/L — ABNORMAL LOW (ref 22–32)
Calcium: 8.3 mg/dL — ABNORMAL LOW (ref 8.9–10.3)
Chloride: 98 mmol/L (ref 98–111)
Creatinine, Ser: 2.89 mg/dL — ABNORMAL HIGH (ref 0.44–1.00)
GFR, Estimated: 18 mL/min — ABNORMAL LOW (ref >60)
Glucose, Bld: 215 mg/dL — ABNORMAL HIGH (ref 70–99)
Potassium: 3.5 mmol/L (ref 3.5–5.1)
Sodium: 130 mmol/L — ABNORMAL LOW (ref 135–145)
Total Bilirubin: 0.8 mg/dL (ref 0.3–1.2)
Total Protein: 7.1 g/dL (ref 6.5–8.1)

## 2020-11-22 LAB — CBC WITH DIFFERENTIAL/PLATELET
Abs Immature Granulocytes: 0.11 10*3/uL — ABNORMAL HIGH (ref 0.00–0.07)
Basophils Absolute: 0.1 10*3/uL (ref 0.0–0.1)
Basophils Relative: 0 %
Eosinophils Absolute: 0.2 10*3/uL (ref 0.0–0.5)
Eosinophils Relative: 1 %
HCT: 22 % — ABNORMAL LOW (ref 36.0–46.0)
Hemoglobin: 7 g/dL — ABNORMAL LOW (ref 12.0–15.0)
Immature Granulocytes: 1 %
Lymphocytes Relative: 15 %
Lymphs Abs: 2.1 10*3/uL (ref 0.7–4.0)
MCH: 29.9 pg (ref 26.0–34.0)
MCHC: 31.8 g/dL (ref 30.0–36.0)
MCV: 94 fL (ref 80.0–100.0)
Monocytes Absolute: 0.8 10*3/uL (ref 0.1–1.0)
Monocytes Relative: 6 %
Neutro Abs: 11 10*3/uL — ABNORMAL HIGH (ref 1.7–7.7)
Neutrophils Relative %: 77 %
Platelets: 165 10*3/uL (ref 150–400)
RBC: 2.34 MIL/uL — ABNORMAL LOW (ref 3.87–5.11)
RDW: 13.2 % (ref 11.5–15.5)
WBC: 14.3 10*3/uL — ABNORMAL HIGH (ref 4.0–10.5)
nRBC: 0 % (ref 0.0–0.2)

## 2020-11-22 LAB — URINALYSIS, ROUTINE W REFLEX MICROSCOPIC
Bilirubin Urine: NEGATIVE
Glucose, UA: NEGATIVE mg/dL
Hgb urine dipstick: NEGATIVE
Ketones, ur: NEGATIVE mg/dL
Leukocytes,Ua: NEGATIVE
Nitrite: NEGATIVE
Protein, ur: NEGATIVE mg/dL
Specific Gravity, Urine: 1.009 (ref 1.005–1.030)
pH: 5 (ref 5.0–8.0)

## 2020-11-22 LAB — PHOSPHORUS: Phosphorus: 3.2 mg/dL (ref 2.5–4.6)

## 2020-11-22 LAB — IRON AND TIBC
Iron: 24 ug/dL — ABNORMAL LOW (ref 28–170)
Saturation Ratios: 9 % — ABNORMAL LOW (ref 10.4–31.8)
TIBC: 270 ug/dL (ref 250–450)
UIBC: 246 ug/dL

## 2020-11-22 LAB — GLUCOSE, CAPILLARY
Glucose-Capillary: 256 mg/dL — ABNORMAL HIGH (ref 70–99)
Glucose-Capillary: 123 mg/dL — ABNORMAL HIGH (ref 70–99)
Glucose-Capillary: 263 mg/dL — ABNORMAL HIGH (ref 70–99)

## 2020-11-22 LAB — PROCALCITONIN: Procalcitonin: 3.28 ng/mL

## 2020-11-22 MED ORDER — SODIUM CHLORIDE 0.9 % IV SOLN
500.0000 mg | INTRAVENOUS | Status: DC
  Administered 2020-11-22: 500 mg via INTRAVENOUS
  Filled 2020-11-22: qty 500

## 2020-11-22 MED ORDER — INSULIN ASPART 100 UNIT/ML ~~LOC~~ SOLN
0.0000 [IU] | Freq: Every day | SUBCUTANEOUS | Status: DC
  Administered 2020-11-22: 3 [IU] via SUBCUTANEOUS

## 2020-11-22 MED ORDER — PANTOPRAZOLE SODIUM 40 MG PO TBEC
40.0000 mg | DELAYED_RELEASE_TABLET | Freq: Every day | ORAL | Status: DC
  Administered 2020-11-22 – 2020-11-24 (×3): 40 mg via ORAL
  Filled 2020-11-22 (×3): qty 1

## 2020-11-22 MED ORDER — NON FORMULARY: 5.0000 mg | Freq: Every day | Status: DC

## 2020-11-22 MED ORDER — CALCIUM CARBONATE ANTACID 500 MG PO CHEW
1.0000 | CHEWABLE_TABLET | Freq: Three times a day (TID) | ORAL | Status: DC
  Administered 2020-11-22 – 2020-11-24 (×5): 200 mg via ORAL
  Filled 2020-11-22 (×5): qty 1

## 2020-11-22 MED ORDER — SODIUM CHLORIDE 0.9 % IV SOLN
1.0000 g | INTRAVENOUS | Status: DC
  Administered 2020-11-22 – 2020-11-23 (×2): 1 g via INTRAVENOUS
  Filled 2020-11-22: qty 1
  Filled 2020-11-22: qty 10
  Filled 2020-11-22: qty 1

## 2020-11-22 MED ORDER — MELATONIN 5 MG PO TABS
5.0000 mg | ORAL_TABLET | Freq: Every day | ORAL | Status: DC
  Administered 2020-11-22 – 2020-11-23 (×2): 5 mg via ORAL
  Filled 2020-11-22 (×2): qty 1

## 2020-11-22 MED ORDER — INSULIN ASPART 100 UNIT/ML ~~LOC~~ SOLN
0.0000 [IU] | Freq: Three times a day (TID) | SUBCUTANEOUS | Status: DC
  Administered 2020-11-22: 5 [IU] via SUBCUTANEOUS
  Administered 2020-11-22: 1 [IU] via SUBCUTANEOUS
  Administered 2020-11-23: 3 [IU] via SUBCUTANEOUS
  Administered 2020-11-23: 5 [IU] via SUBCUTANEOUS
  Administered 2020-11-23: 2 [IU] via SUBCUTANEOUS
  Administered 2020-11-24: 1 [IU] via SUBCUTANEOUS

## 2020-11-22 NOTE — Progress Notes (Addendum)
PROGRESS NOTE    [name]  ZJI:[id number] DOB: 1961-07-25 DOA: 11/20/2020 PCP: Charlott Rakes, MD   Brief Narrative:   The patient is a 59 year old female with a past medical history significant for but not limited to hypertension, history of chronic kidney disease stage IIIb,, history of adrenal insufficiency on Cortef, hypothyroidism as well as other comorbidities who presented to the ED with a chief complaint of 3-day of history of severe diffuse abdominal pain and bloating also showed with no bowel movement for 3 days.  12/4: Got lasix yesterday. Echo w/ G2DD. Hold lasix today as she is dry. Procal is elevated. She has an elevated white count and fever today. I would tend to think this is more pneumonia than anything. I see her BNP is also elevated. She has more the appearance of a chronic HF w/ a new PNA. Will start rocephin, zithro for CAP coverage. Has dyspepsia. Add protonix. Family reports gait disturbance at home. Will have PT eval.   Assessment & Plan: Newly Dx'd, diastolic HF not in exacerbation CAP Acute hypoxic respiratory failure     - rocephin, zithro, mucinex     - IS     - hold lasix today     - wean O2 as able  HTN     - continue amlodipine, hydralazine  AKI on CKD4     - Scr up to 2.89 today     - hold lasix     - gentle hydration today.  Normocytic anemia     - iron deficiency and chronic disease     - transfuse for Hgb < 7.0  Hyponatremia     - gentle hydration today  Gait disturbance [place]     - PT eval  Dyspepsia     - PPI, TUMS  Chronic adrenal insufficiency     - continue cortef  DVT prophylaxis: SCDs Code Status: FULL Family Communication: With dtr by phone   Status is: Inpatient  Remains inpatient appropriate because:Inpatient level of care appropriate due to severity of illness   Dispo: The patient is from: Home              Anticipated d/c is to: Home              Anticipated d/c date is: 2 days              Patient  currently is not medically stable to d/c.  Consultants:   None  Procedures:   None  Antimicrobials:  . Rocephin, zithro   ROS:  Reports dyspnea, dyspepsia. Denies CP, palpitations, N/V . Remainder ROS is negative for all not previously mentioned.  Subjective: "It's a little better."  Objective: Vitals:   11/21/20 1930 11/21/20 2011 11/22/20 0032 11/22/20 0436  BP: (!) 158/51 (!) 156/54 (!) 155/58 (!) 150/44  Pulse: 86 84 79 84  Resp: 17 18  16   Temp: 98.5 F (36.9 C) 98.4 F (36.9 C) 98.4 F (36.9 C) 99.7 F (37.6 C)  TempSrc: Oral Oral Oral Oral  SpO2: 97% 96%  96%  Weight:   52 kg   Height:   5\' 2"  (1.575 m)     Intake/Output Summary (Last 24 hours) at 11/22/2020 0730 Last data filed at 11/22/2020 0400 Gross per 24 hour  Intake --  Output 500 ml  Net -500 ml   Filed Weights   11/22/20 0032  Weight: 52 kg    Examination:  General: 59 y.o. female resting in chair in NAD  Eyes: PERRL, normal sclera ENMT: Nares patent w/o discharge, orophaynx clear, dentition normal, ears w/o discharge/lesions/ulcers Neck: Supple, trachea midline Cardiovascular: RRR, +S1, S2, no m/g/r, equal pulses throughout Respiratory: CTABL, no w/r/r, normal WOB GI: BS+, NDNT, no masses noted, no organomegaly noted MSK: No e/c/c Skin: No rashes, bruises, ulcerations noted Neuro: A&O x 3, no focal deficits Psyc: Appropriate interaction and affect, calm/cooperative   Data Reviewed: I have personally reviewed following labs and imaging studies.  CBC: Recent Labs  Lab 11/20/20 2144 11/21/20 1124  WBC 13.2* 13.3*  NEUTROABS  --  11.8*  HGB 8.2* 7.1*  HCT 25.8* 22.8*  MCV 94.9 96.2  PLT 146* 196*   Basic Metabolic Panel: Recent Labs  Lab 11/20/20 2144 11/21/20 1124  NA 134* 135  K 4.5 4.0  CL 106 104  CO2 18* 19*  GLUCOSE 159* 144*  BUN 42* 44*  CREATININE 2.18* 2.52*  CALCIUM 8.8* 8.1*  MG  --  2.1  PHOS  --  4.1   GFR: Estimated Creatinine Clearance: 18.5 mL/min  (A) (by C-G formula based on SCr of 2.52 mg/dL (H)). Liver Function Tests: Recent Labs  Lab 11/20/20 2144 11/21/20 1124  AST 34 29  ALT 43 34  ALKPHOS 118 103  BILITOT 0.9 0.8  PROT 7.9 6.9  ALBUMIN 4.4 3.7   Recent Labs  Lab 11/20/20 2144  LIPASE 22   No results for input(s): AMMONIA in the last 168 hours. Coagulation Profile: No results for input(s): INR, PROTIME in the last 168 hours. Cardiac Enzymes: No results for input(s): CKTOTAL, CKMB, CKMBINDEX, TROPONINI in the last 168 hours. BNP (last 3 results) No results for input(s): PROBNP in the last 8760 hours. HbA1C: No results for input(s): HGBA1C in the last 72 hours. CBG: Recent Labs  Lab 11/20/20 1928 11/20/20 2121 11/21/20 1448  GLUCAP 152* 141* 262*   Lipid Profile: No results for input(s): CHOL, HDL, LDLCALC, TRIG, CHOLHDL, LDLDIRECT in the last 72 hours. Thyroid Function Tests: No results for input(s): TSH, T4TOTAL, FREET4, T3FREE, THYROIDAB in the last 72 hours. Anemia Panel: No results for input(s): VITAMINB12, FOLATE, FERRITIN, TIBC, IRON, RETICCTPCT in the last 72 hours. Sepsis Labs: Recent Labs  Lab 11/20/20 2144 11/21/20 0207  LATICACIDVEN 1.0 0.9    Recent Results (from the past 240 hour(s))  Resp Panel by RT-PCR (Flu A&B, Covid) Nasopharyngeal Swab     Status: None   Collection Time: 11/20/20 11:33 PM   Specimen: Nasopharyngeal Swab; Nasopharyngeal(NP) swabs in vial transport medium  Result Value Ref Range Status   SARS Coronavirus 2 by RT PCR NEGATIVE NEGATIVE Final    Comment: (NOTE) SARS-CoV-2 target nucleic acids are NOT DETECTED.  The SARS-CoV-2 RNA is generally detectable in upper respiratory specimens during the acute phase of infection. The lowest concentration of SARS-CoV-2 viral copies this assay can detect is 138 copies/mL. A negative result does not preclude SARS-Cov-2 infection and should not be used as the sole basis for treatment or other patient management decisions. A  negative result may occur with  improper specimen collection/handling, submission of specimen other than nasopharyngeal swab, presence of viral mutation(s) within the areas targeted by this assay, and inadequate number of viral copies(<138 copies/mL). A negative result must be combined with clinical observations, patient history, and epidemiological information. The expected result is Negative.  Fact Sheet for Patients:  EntrepreneurPulse.com.au  Fact Sheet for Healthcare Providers:  IncredibleEmployment.be  This test is no t yet approved or cleared by the [place] and  has been authorized for detection and/or diagnosis of SARS-CoV-2 by FDA under an Emergency Use Authorization (EUA). This EUA will remain  in effect (meaning this test can be used) for the duration of the COVID-19 declaration under Section 564(b)(1) of the Act, 21 U.S.C.section 360bbb-3(b)(1), unless the authorization is terminated  or revoked sooner.       Influenza A by PCR NEGATIVE NEGATIVE Final   Influenza B by PCR NEGATIVE NEGATIVE Final    Comment: (NOTE) The Xpert Xpress SARS-CoV-2/FLU/RSV plus assay is intended as an aid in the diagnosis of influenza from Nasopharyngeal swab specimens and should not be used as a sole basis for treatment. Nasal washings and aspirates are unacceptable for Xpert Xpress SARS-CoV-2/FLU/RSV testing.  Fact Sheet for Patients: EntrepreneurPulse.com.au  Fact Sheet for Healthcare Providers: IncredibleEmployment.be  This test is not yet approved or cleared by the [place] FDA and has been authorized for detection and/or diagnosis of SARS-CoV-2 by FDA under an Emergency Use Authorization (EUA). This EUA will remain in effect (meaning this test can be used) for the duration of the COVID-19 declaration under Section 564(b)(1) of the Act, 21 U.S.C. section 360bbb-3(b)(1), unless the authorization  is terminated or revoked.  Performed at [facility], [place] [address], [place], [place] [address]       Radiology Studies: CT ABDOMEN PELVIS WO CONTRAST  Result Date: 11/20/2020 CLINICAL DATA:  Acute abdominal pain and history suggestive of constipation EXAM: CT ABDOMEN AND PELVIS WITHOUT CONTRAST TECHNIQUE: Multidetector CT imaging of the abdomen and pelvis was performed following the standard protocol without IV contrast. COMPARISON:  12/09/2017 FINDINGS: Lower chest: Small pleural effusions are noted right greater than left with associated atelectatic changes. Hepatobiliary: No focal liver abnormality is seen. No gallstones, gallbladder wall thickening, or biliary dilatation. Pancreas: Unremarkable. No pancreatic ductal dilatation or surrounding inflammatory changes. Spleen: Normal in size without focal abnormality. Adrenals/Urinary Tract: Adrenal glands are within normal limits. Kidneys are well visualized with prominent right extrarenal pelvis. No obstructive changes are seen. The bladder is well distended. Stomach/Bowel: Mild fecal material is noted within the colon without definitive obstructive change. No findings to suggest constipation are identified. The appendix is not well visualized. No inflammatory changes to suggest appendicitis are noted. Stomach is decompressed. A few mildly dilated loops of small bowel are noted without definitive obstructive change. This may be related to mild enteritis. Vascular/Lymphatic: Aortic atherosclerosis. No enlarged abdominal or pelvic lymph nodes. Reproductive: Uterus and bilateral adnexa are unremarkable. Other: No abdominal wall hernia or abnormality. No abdominopelvic ascites. Musculoskeletal: No acute or significant osseous findings. IMPRESSION: Bilateral pleural effusions right greater than left with associated atelectatic changes. A few mildly dilated loops of small bowel without definitive obstructive change. This likely represents  a mild enteritis. No findings to suggest constipation are noted. Electronically Signed   By: Inez Catalina M.D.   On: 11/20/2020 23:06   CT HEAD WO CONTRAST  Result Date: 11/20/2020 CLINICAL DATA:  Increasing headaches EXAM: CT HEAD WITHOUT CONTRAST TECHNIQUE: Contiguous axial images were obtained from the base of the skull through the vertex without intravenous contrast. COMPARISON:  01/28/2020 FINDINGS: Brain: Ventricles are again prominent. No findings to suggest acute hemorrhage, acute infarction or space-occupying mass lesion are noted. Vascular: No hyperdense vessel or unexpected calcification. Skull: Normal. Negative for fracture or focal lesion. Sinuses/Orbits: No acute finding. Other: None. IMPRESSION: Prominent ventricular system stable from the prior exam. No new focal abnormality is noted. Electronically Signed   By: Inez Catalina  M.D.   On: 11/20/2020 23:08   NM Pulmonary Perfusion  Result Date: 11/21/2020 CLINICAL DATA:  Respiratory failure.  Elevated D-dimer. EXAM: NUCLEAR MEDICINE PERFUSION LUNG SCAN TECHNIQUE: Perfusion images were obtained in multiple projections after intravenous injection of radiopharmaceutical. Ventilation scans intentionally deferred if perfusion scan and chest x-ray adequate for interpretation during COVID 19 epidemic. RADIOPHARMACEUTICALS:  4.1 mCi Tc-56m MAA IV COMPARISON:  Chest x-ray November 20, 2020 FINDINGS: No segmental defects are identified. IMPRESSION: No evidence of pulmonary embolus. Electronically Signed   By: Dorise Bullion III M.D   On: 11/21/2020 15:04   DG CHEST PORT 1 VIEW  Result Date: 11/22/2020 CLINICAL DATA:  Shortness of breath EXAM: PORTABLE CHEST 1 VIEW COMPARISON:  Chest x-rays dated 11/20/2020 and 03/27/2018. FINDINGS: Patchy bilateral perihilar airspace opacities are slightly improved compared to most recent chest x-ray of 11/20/2020. no new lung findings. No pleural effusion or pneumothorax is seen. Stable cardiomegaly. IMPRESSION: 1.  Patchy bilateral perihilar airspace opacities are slightly improved compared to chest x-ray of 11/20/2020. In the absence of fever, I favor CHF with interval improvement in fluid status. 2. Stable cardiomegaly. Electronically Signed   By: Franki Cabot M.D.   On: 11/22/2020 04:42   DG Chest Port 1 View  Result Date: 11/20/2020 CLINICAL DATA:  59 year old female with shortness of breath. EXAM: PORTABLE CHEST 1 VIEW COMPARISON:  Chest radiograph dated 03/27/2018. FINDINGS: There is cardiomegaly with vascular congestion and edema. Superimposed pneumonia is not excluded clinical correlation is recommended. No large pleural effusion or pneumothorax. No acute osseous pathology. IMPRESSION: Cardiomegaly with findings of CHF. Superimposed pneumonia is not excluded. Electronically Signed   By: Anner Crete M.D.   On: 11/20/2020 23:46   ECHOCARDIOGRAM COMPLETE  Result Date: 11/21/2020    ECHOCARDIOGRAM REPORT   Patient Name:   IRAM ASTORINO Date of Exam: [birth year] Medical Rec #:  [id number]      Height:       63.0 in Accession #:    [id number]     Weight:       109.6 lb Date of Birth:  1961/10/22     BSA:          1.497 m Patient Age:    54 years       BP:           148/50 mmHg Patient Gender: F              HR:           91 bpm. Exam Location:  Inpatient Procedure: 2D Echo, Color Doppler and Cardiac Doppler Indications:    I09.73 Acute diastolic (congestive) heart failure  History:        Patient has no prior history of Echocardiogram examinations.                 Risk Factors:Hypertension and Diabetes.  Sonographer:    Raquel Sarna Senior RDCS Referring Phys: [place]  1. Left ventricular ejection fraction, by estimation, is 65 to 70%. The left ventricle has normal function. The left ventricle has no regional wall motion abnormalities. Left ventricular diastolic parameters are consistent with Grade II diastolic dysfunction (pseudonormalization).  2. Right ventricular systolic function is  normal. The right ventricular size is normal. There is mildly elevated pulmonary artery systolic pressure.  3. The mitral valve is grossly normal. Mild mitral valve regurgitation. No evidence of mitral stenosis.  4. The aortic valve is normal in structure. Aortic valve regurgitation is not visualized. No  aortic stenosis is present.  5. The inferior vena cava is normal in size with greater than 50% respiratory variability, suggesting right atrial pressure of 3 mmHg. FINDINGS  Left Ventricle: Left ventricular ejection fraction, by estimation, is 65 to 70%. The left ventricle has normal function. The left ventricle has no regional wall motion abnormalities. The left ventricular internal cavity size was normal in size. There is  no left ventricular hypertrophy. Left ventricular diastolic parameters are consistent with Grade II diastolic dysfunction (pseudonormalization). Right Ventricle: The right ventricular size is normal. No increase in right ventricular wall thickness. Right ventricular systolic function is normal. There is mildly elevated pulmonary artery systolic pressure. The tricuspid regurgitant velocity is 3.00  m/s, and with an assumed right atrial pressure of 8 mmHg, the estimated right ventricular systolic pressure is 06.2 mmHg. Left Atrium: Left atrial size was normal in size. Right Atrium: Right atrial size was normal in size. Pericardium: There is no evidence of pericardial effusion. Mitral Valve: The mitral valve is grossly normal. Mild mitral valve regurgitation. No evidence of mitral valve stenosis. MV peak gradient, 12.1 mmHg. The mean mitral valve gradient is 6.0 mmHg. Tricuspid Valve: The tricuspid valve is normal in structure. Tricuspid valve regurgitation is mild. Aortic Valve: The aortic valve is normal in structure. Aortic valve regurgitation is not visualized. No aortic stenosis is present. Pulmonic Valve: The pulmonic valve was normal in structure. Pulmonic valve regurgitation is not  visualized. Aorta: The aortic root and ascending aorta are structurally normal, with no evidence of dilitation. Venous: The inferior vena cava is normal in size with greater than 50% respiratory variability, suggesting right atrial pressure of 3 mmHg. IAS/Shunts: The atrial septum is grossly normal.  LEFT VENTRICLE PLAX 2D LVIDd:         4.90 cm  Diastology LVIDs:         2.80 cm  LV e' medial:    6.85 cm/s LV PW:         0.90 cm  LV E/e' medial:  20.4 LV IVS:        1.10 cm  LV e' lateral:   4.68 cm/s LVOT diam:     1.70 cm  LV E/e' lateral: 29.9 LV SV:         50 LV SV Index:   34 LVOT Area:     2.27 cm  RIGHT VENTRICLE RV S prime:     15.20 cm/s TAPSE (M-mode): 2.4 cm LEFT ATRIUM             Index       RIGHT ATRIUM           Index LA diam:        3.90 cm 2.60 cm/m  RA Area:     14.30 cm LA Vol (A2C):   60.7 ml 40.54 ml/m RA Volume:   33.20 ml  22.17 ml/m LA Vol (A4C):   37.4 ml 24.98 ml/m LA Biplane Vol: 49.1 ml 32.79 ml/m  AORTIC VALVE LVOT Vmax:   116.00 cm/s LVOT Vmean:  81.500 cm/s LVOT VTI:    0.222 m  AORTA Ao Root diam: 2.70 cm Ao Asc diam:  2.60 cm MITRAL VALVE                TRICUSPID VALVE MV Area (PHT): 3.42 cm     TR Peak grad:   36.0 mmHg MV Peak grad:  12.1 mmHg    TR Vmax:        300.00 cm/s  MV Mean grad:  6.0 mmHg MV Vmax:       1.74 m/s     SHUNTS MV Vmean:      113.0 cm/s   Systemic VTI:  0.22 m MV Decel Time: 222 msec     Systemic Diam: 1.70 cm MV E velocity: 140.00 cm/s MV A velocity: 106.00 cm/s MV E/A ratio:  1.32 Mertie Moores MD Electronically signed by Mertie Moores MD Signature Date/Time: 11/21/2020/12:18:25 PM    Final    VAS [place] LOWER EXTREMITY VENOUS (DVT)  Result Date: 11/21/2020  Lower Venous DVT Study Indications: Elevated ddimer, Swelling, and Edema.  Comparison Study: no prior Performing Technologist: Abram Sander RVS  Examination Guidelines: A complete evaluation includes B-mode imaging, spectral Doppler, color Doppler, and power Doppler as needed of all accessible  portions of each vessel. Bilateral testing is considered an integral part of a complete examination. Limited examinations for reoccurring indications may be performed as noted. The reflux portion of the exam is performed with the patient in reverse Trendelenburg.  +---------+---------------+---------+-----------+----------+--------------+ RIGHT    CompressibilityPhasicitySpontaneityPropertiesThrombus Aging +---------+---------------+---------+-----------+----------+--------------+ CFV      Full           Yes      Yes                                 +---------+---------------+---------+-----------+----------+--------------+ SFJ      Full                                                        +---------+---------------+---------+-----------+----------+--------------+ FV Prox  Full                                                        +---------+---------------+---------+-----------+----------+--------------+ FV Mid   Full                                                        +---------+---------------+---------+-----------+----------+--------------+ FV DistalFull                                                        +---------+---------------+---------+-----------+----------+--------------+ PFV      Full                                                        +---------+---------------+---------+-----------+----------+--------------+ POP      Full           Yes      Yes                                 +---------+---------------+---------+-----------+----------+--------------+  PTV      Full                                                        +---------+---------------+---------+-----------+----------+--------------+ PERO     Full                                                        +---------+---------------+---------+-----------+----------+--------------+   +---------+---------------+---------+-----------+----------+--------------+ LEFT      CompressibilityPhasicitySpontaneityPropertiesThrombus Aging +---------+---------------+---------+-----------+----------+--------------+ CFV      Full           Yes      Yes                                 +---------+---------------+---------+-----------+----------+--------------+ SFJ      Full                                                        +---------+---------------+---------+-----------+----------+--------------+ FV Prox  Full                                                        +---------+---------------+---------+-----------+----------+--------------+ FV Mid   Full                                                        +---------+---------------+---------+-----------+----------+--------------+ FV DistalFull                                                        +---------+---------------+---------+-----------+----------+--------------+ PFV      Full                                                        +---------+---------------+---------+-----------+----------+--------------+ POP      Full           Yes      Yes                                 +---------+---------------+---------+-----------+----------+--------------+ PTV      Full                                                        +---------+---------------+---------+-----------+----------+--------------+  PERO     Full                                                        +---------+---------------+---------+-----------+----------+--------------+     Summary: BILATERAL: - No evidence of deep vein thrombosis seen in the lower extremities, bilaterally. - No evidence of superficial venous thrombosis in the lower extremities, bilaterally. -No evidence of popliteal cyst, bilaterally.   *See table(s) above for measurements and observations.    Preliminary      Scheduled Meds: . amLODipine  10 mg Oral Daily  . aspirin  81 mg Oral Daily  . enoxaparin (LOVENOX) injection  30 mg  Subcutaneous Q24H  . fluticasone  2 spray Each Nare Daily  . hydrALAZINE  100 mg Oral Q8H  . hydrocortisone  10 mg Oral BID  . levothyroxine  150 mcg Oral Q0600  . multivitamin with minerals  1 tablet Oral Daily  . polyethylene glycol  17 g Oral BID  . senna-docusate  1 tablet Oral BID  . sodium chloride flush  3 mL Intravenous Q12H   Continuous Infusions: . sodium chloride       LOS: 1 day    Time spent: 35 minutes spent in the coordination of care today.    Jonnie Finner, DO [facility]  If 7PM-7AM, please contact night-coverage [email or web address] 11/22/2020, 7:30 AM

## 2020-11-23 ENCOUNTER — Inpatient Hospital Stay (HOSPITAL_COMMUNITY): Payer: Self-pay

## 2020-11-23 LAB — CBC WITH DIFFERENTIAL/PLATELET
Abs Immature Granulocytes: 0.11 10*3/uL — ABNORMAL HIGH (ref 0.00–0.07)
Basophils Absolute: 0.1 10*3/uL (ref 0.0–0.1)
Basophils Relative: 1 %
Eosinophils Absolute: 0.2 10*3/uL (ref 0.0–0.5)
Eosinophils Relative: 2 %
HCT: 21.2 % — ABNORMAL LOW (ref 36.0–46.0)
Hemoglobin: 6.8 g/dL — CL (ref 12.0–15.0)
Immature Granulocytes: 1 %
Lymphocytes Relative: 14 %
Lymphs Abs: 1.5 10*3/uL (ref 0.7–4.0)
MCH: 30 pg (ref 26.0–34.0)
MCHC: 32.1 g/dL (ref 30.0–36.0)
MCV: 93.4 fL (ref 80.0–100.0)
Monocytes Absolute: 0.6 10*3/uL (ref 0.1–1.0)
Monocytes Relative: 5 %
Neutro Abs: 8.2 10*3/uL — ABNORMAL HIGH (ref 1.7–7.7)
Neutrophils Relative %: 77 %
Platelets: 152 10*3/uL (ref 150–400)
RBC: 2.27 MIL/uL — ABNORMAL LOW (ref 3.87–5.11)
RDW: 13 % (ref 11.5–15.5)
WBC: 10.7 10*3/uL — ABNORMAL HIGH (ref 4.0–10.5)
nRBC: 0 % (ref 0.0–0.2)

## 2020-11-23 LAB — BASIC METABOLIC PANEL
Anion gap: 12 (ref 5–15)
BUN: 48 mg/dL — ABNORMAL HIGH (ref 8–23)
CO2: 21 mmol/L — ABNORMAL LOW (ref 22–32)
Calcium: 8.3 mg/dL — ABNORMAL LOW (ref 8.9–10.3)
Chloride: 99 mmol/L (ref 98–111)
Creatinine, Ser: 2.51 mg/dL — ABNORMAL HIGH (ref 0.44–1.00)
GFR, Estimated: 21 mL/min — ABNORMAL LOW (ref >60)
Glucose, Bld: 191 mg/dL — ABNORMAL HIGH (ref 70–99)
Potassium: 3.7 mmol/L (ref 3.5–5.1)
Sodium: 132 mmol/L — ABNORMAL LOW (ref 135–145)

## 2020-11-23 LAB — OCCULT BLOOD X 1 CARD TO LAB, STOOL: Fecal Occult Bld: NEGATIVE

## 2020-11-23 LAB — GLUCOSE, CAPILLARY
Glucose-Capillary: 158 mg/dL — ABNORMAL HIGH (ref 70–99)
Glucose-Capillary: 209 mg/dL — ABNORMAL HIGH (ref 70–99)
Glucose-Capillary: 258 mg/dL — ABNORMAL HIGH (ref 70–99)
Glucose-Capillary: 98 mg/dL (ref 70–99)

## 2020-11-23 LAB — HEMOGLOBIN AND HEMATOCRIT, BLOOD
HCT: 25.9 % — ABNORMAL LOW (ref 36.0–46.0)
Hemoglobin: 8.6 g/dL — ABNORMAL LOW (ref 12.0–15.0)

## 2020-11-23 LAB — PREPARE RBC (CROSSMATCH)

## 2020-11-23 MED ORDER — ISOSORBIDE MONONITRATE ER 30 MG PO TB24
30.0000 mg | ORAL_TABLET | Freq: Every day | ORAL | Status: DC
  Administered 2020-11-23 – 2020-11-24 (×2): 30 mg via ORAL
  Filled 2020-11-23 (×2): qty 1

## 2020-11-23 MED ORDER — AZITHROMYCIN 250 MG PO TABS
500.0000 mg | ORAL_TABLET | Freq: Every day | ORAL | Status: DC
  Administered 2020-11-23: 500 mg via ORAL
  Filled 2020-11-23: qty 2

## 2020-11-23 MED ORDER — METOPROLOL SUCCINATE ER 25 MG PO TB24
12.5000 mg | ORAL_TABLET | Freq: Every day | ORAL | Status: DC
  Administered 2020-11-23 – 2020-11-24 (×2): 12.5 mg via ORAL
  Filled 2020-11-23 (×2): qty 1

## 2020-11-23 MED ORDER — SODIUM CHLORIDE 0.9% IV SOLUTION: Freq: Once | INTRAVENOUS | Status: AC

## 2020-11-23 MED ORDER — BENZONATATE 100 MG PO CAPS: 200.0000 mg | ORAL_CAPSULE | Freq: Two times a day (BID) | ORAL | Status: DC | PRN

## 2020-11-23 NOTE — Progress Notes (Signed)
Critical Lab Hemoglobin: 6.8 provider made aware

## 2020-11-23 NOTE — Plan of Care (Signed)
  Problem: Clinical Measurements: Goal: Diagnostic test results will improve Outcome: Progressing Goal: Respiratory complications will improve Outcome: Progressing Goal: Cardiovascular complication will be avoided Outcome: Progressing   

## 2020-11-23 NOTE — Evaluation (Signed)
Physical Therapy Evaluation Patient Details Name: [name] MRN: [id number] DOB: [birth year] Today's Date: 11/23/2020   History of Present Illness  The patient is a 59 year old female with a past medical history significant for but not limited to hypertension, history of chronic kidney disease stage IIIb,, history of adrenal insufficiency on Cortef, hypothyroidism as well as other comorbidities who presented to the ED with a chief complaint of 3-day of history of severe diffuse abdominal pain and bloating. MD treating for PNA.  Clinical Impression  Pt admitted with above diagnosis.  Pt currently with functional limitations due to the deficits listed below (see PT Problem List). Pt will benefit from skilled PT to increase their independence and safety with mobility to allow discharge to the venue listed below.  Pt's Hgb 6.8 and awaiting a unit of blood, but RN okayed session.  Pt with no c/o dizziness, just weakness overall. Able to ambulate on RA with 02 93%.  Her gait was unsteady with HHA with reaching out for rail or wall for support. Unsure if this is due to low Hgb today.  Will continue to assess need for RW vs. Cane.  Pt has 24 hour A and does not want HH.     Follow Up Recommendations No PT follow up;Supervision for mobility/OOB    Equipment Recommendations  Other (comment) (cane vs. RW depending on progress)    Recommendations for Other Services       Precautions / Restrictions Precautions Precautions: Fall Restrictions Weight Bearing Restrictions: No      Mobility  Bed Mobility Overal bed mobility: Needs Assistance Bed Mobility: Supine to Sit     Supine to sit: Supervision;HOB elevated          Transfers Overall transfer level: Needs assistance Equipment used: 1 person hand held assist Transfers: Sit to/from Stand Sit to Stand: Min assist         General transfer comment: MIN HHA to power up  Ambulation/Gait Ambulation/Gait assistance: Min assist Gait  Distance (Feet): 70 Feet Assistive device: 1 person hand held assist Gait Pattern/deviations: Wide base of support;Decreased step length - right;Decreased step length - left;Decreased dorsiflexion - right;Decreased dorsiflexion - left     General Gait Details: Pt with WBOS and reaching out for rail/wall with other hand. Reports feeling more wobbly than normal. o2 93% after gait on RA  Stairs            Wheelchair Mobility    Modified Rankin (Stroke Patients Only)       Balance Overall balance assessment: History of Falls;Needs assistance Sitting-balance support: Feet supported Sitting balance-Leahy Scale: Good       Standing balance-Leahy Scale: Poor                               Pertinent Vitals/Pain Pain Assessment: No/denies pain    Home Living Family/patient expects to be discharged to:: Private residence Living Arrangements: Children Available Help at Discharge: Family;Available 24 hours/day Type of Home: House       Home Layout: Two level Home Equipment: None      Prior Function Level of Independence: Independent         Comments: Used to walk with a cane, but lost it. Sounds like she furniture/wall cruises.     Hand Dominance        Extremity/Trunk Assessment   Upper Extremity Assessment Upper Extremity Assessment: Overall WFL for tasks assessed    Lower Extremity  Assessment Lower Extremity Assessment: Generalized weakness;Overall [facility] for tasks assessed    Cervical / Trunk Assessment Cervical / Trunk Assessment: Normal  Communication   Communication: No difficulties  Cognition Arousal/Alertness: Awake/alert Behavior During Therapy: WFL for tasks assessed/performed Overall Cognitive Status: Within Functional Limits for tasks assessed                                        General Comments      Exercises     Assessment/Plan    PT Assessment Patient needs continued PT services  PT Problem List  Decreased strength;Decreased activity tolerance;Decreased balance;Decreased mobility;Decreased knowledge of use of DME       PT Treatment Interventions DME instruction;Gait training;Functional mobility training;Neuromuscular re-education;Balance training;Therapeutic exercise;Therapeutic activities    PT Goals (Current goals can be found in the Care Plan section)  Acute Rehab PT Goals Patient Stated Goal: not feel so weak PT Goal Formulation: With patient Time For Goal Achievement: 12/07/20 Potential to Achieve Goals: Good    Frequency Min 3X/week   Barriers to discharge        Co-evaluation               AM-PAC PT "6 Clicks" Mobility  Outcome Measure Help needed turning from your back to your side while in a flat bed without using bedrails?: None Help needed moving from lying on your back to sitting on the side of a flat bed without using bedrails?: A Little Help needed moving to and from a bed to a chair (including a wheelchair)?: A Little Help needed standing up from a chair using your arms (e.g., wheelchair or bedside chair)?: A Little Help needed to walk in hospital room?: A Little Help needed climbing 3-5 steps with a railing? : A Lot 6 Click Score: 18    End of Session Equipment Utilized During Treatment: Gait belt Activity Tolerance: Patient limited by fatigue Patient left: in bed;with call bell/phone within reach Nurse Communication: Mobility status PT Visit Diagnosis: Unsteadiness on feet (R26.81);Other abnormalities of gait and mobility (R26.89);Muscle weakness (generalized) (M62.81)    Time: 9191-6606 PT Time Calculation (min) (ACUTE ONLY): 15 min   Charges:   PT Evaluation $PT Eval Moderate Complexity: 1 Mod          Gena Laski L. Tamala Julian, [place] Pager [phone] 11/23/2020   Galen Manila 11/23/2020, 11:08 AM

## 2020-11-23 NOTE — Progress Notes (Signed)
PHARMACIST - PHYSICIAN COMMUNICATION  CONCERNING: Antibiotic IV to Oral Route Change Policy  RECOMMENDATION: This patient is receiving azithromycin by the intravenous route.  Based on criteria approved by the Pharmacy and Therapeutics Committee, the antibiotic(s) is/are being converted to the equivalent oral dose form(s).   DESCRIPTION: These criteria include:  Patient being treated for a respiratory tract infection, urinary tract infection, cellulitis or clostridium difficile associated diarrhea if on metronidazole  The patient is not neutropenic and does not exhibit a GI malabsorption state  The patient is eating (either orally or via tube) and/or has been taking other orally administered medications for a least 24 hours  The patient is improving clinically and has a Tmax < 100.5  If you have questions about this conversion, please contact the Pharmacy Department  []  ( [phone] )  [place] []  ( [phone] )  [facility] []  ( [phone] )  [place] []  ( [phone] )  [facility] [x]  ( [phone] )  [facility]  

## 2020-11-23 NOTE — Progress Notes (Signed)
PROGRESS NOTE    [name]  KGU:[id number] DOB: 11/14/1961 DOA: 11/20/2020 PCP: Charlott Rakes, MD   Brief Narrative:   The patient is a 59 year old female with a past medical history significant for but not limited to hypertension, history of chronic kidney disease stage IIIb,, history of adrenal insufficiency on Cortef, hypothyroidism as well as other comorbidities who presented to the ED with a chief complaint of 3-day of history of severe diffuse abdominal pain and bloating also showed with no bowel movement for 3 days.  12/5: Hgb down again today. Transfuse 1 unit pRBCs, check FOBT. C/o RUQ ab pain, worse with eating. Check RUQ [place]. Respiratory status is improving. Continue to wean O2.    Assessment & Plan: Newly Dx'd, diastolic HF not in exacerbation     - her BNP is elevated, but her exam is euvolemic     - echo: EF 65-70%, G2DD, no RWA     - needs follow up with PCP/cardiology     - renal function too poor to start ACEi, ARB     - currently on hydralazine, add imdur     - if she can tolerate a little beta blocker, will add as well  CAP Acute hypoxic respiratory failure     - rocephin, zithro, mucinex     - IS     - wean O2 as able     - 12/5: she's more alert and says she's breathing better today. Continue zithro to tomorrow, transition to cefdinir in the AM to complete 5 total days of cephalosporins.  HTN     - continue amlodipine, hydralazine  AKI on CKD4     - Scr down to 2.51 today     - renal function too poor to tolerate ARB/ACEi right now     - follow  Normocytic anemia     - iron deficiency and chronic disease     - Hgb is 6.8 this morning, transfuse 1 unit pRBCs, check FOBT  Hyponatremia     - improving, follow  Gait disturbance Falls     - PT eval: No further follow up needed  Dyspepsia     - PPI, TUMS     - check RUQ [place]  Chronic adrenal insufficiency     - continue cortef  DVT prophylaxis: SCDs Code Status: FULL Family  Communication: With dtr at bedside.   Status is: Inpatient  Remains inpatient appropriate because:Inpatient level of care appropriate due to severity of illness   Dispo: The patient is from: Home              Anticipated d/c is to: Home              Anticipated d/c date is: 1 day              Patient currently is not medically stable to d/c.  Consultants:   None  Antimicrobials:  . Rocephin, zithro   ROS:  Reports improved breathing. Denies CP, N, V, ab pain . Remainder ROS is negative for all not previously mentioned.  Subjective: "Thank you."  Objective: Vitals:   11/22/20 1014 11/22/20 1206 11/22/20 2050 11/23/20 0520  BP:  (!) 154/44 (!) 148/46 (!) 154/52  Pulse:  76 70 80  Resp:  16 20 16   Temp:  (!) 100.4 F (38 C) 98.5 F (36.9 C) 98.5 F (36.9 C)  TempSrc:  Oral Oral Oral  SpO2: 93% 92% 97% 97%  Weight:    50.7 kg  Height:        Intake/Output Summary (Last 24 hours) at 11/23/2020 0724 Last data filed at 11/23/2020 0531 Gross per 24 hour  Intake 558.33 ml  Output 1350 ml  Net -791.67 ml   Filed Weights   11/22/20 0032 11/23/20 0520  Weight: 52 kg 50.7 kg    Examination:  General: 59 y.o. female resting in bed in NAD Eyes: PERRL, normal sclera ENMT: Nares patent w/o discharge, orophaynx clear, dentition normal, ears w/o discharge/lesions/ulcers Neck: Supple, trachea midline Cardiovascular: RRR, +S1, S2, no m/g/r, equal pulses throughout Respiratory: CTABL, no w/r/r, normal WOB GI: BS+, NDNT, no masses noted, no organomegaly noted MSK: No e/c/c Skin: No rashes, bruises, ulcerations noted Neuro: A&O x 3, no focal deficits Psyc: Appropriate interaction and affect, calm/cooperative   Data Reviewed: I have personally reviewed following labs and imaging studies.  CBC: Recent Labs  Lab 11/20/20 2144 11/21/20 1124 11/22/20 0635 11/23/20 0629  WBC 13.2* 13.3* 14.3* 10.7*  NEUTROABS  --  11.8* 11.0* 8.2*  HGB 8.2* 7.1* 7.0* 6.8*  HCT 25.8*  22.8* 22.0* 21.2*  MCV 94.9 96.2 94.0 93.4  PLT 146* 139* 165 545   Basic Metabolic Panel: Recent Labs  Lab 11/20/20 2144 11/21/20 1124 11/22/20 0635  NA 134* 135 130*  K 4.5 4.0 3.5  CL 106 104 98  CO2 18* 19* 21*  GLUCOSE 159* 144* 215*  BUN 42* 44* 51*  CREATININE 2.18* 2.52* 2.89*  CALCIUM 8.8* 8.1* 8.3*  MG  --  2.1 2.2  PHOS  --  4.1 3.2   GFR: Estimated Creatinine Clearance: 16.2 mL/min (A) (by C-G formula based on SCr of 2.89 mg/dL (H)). Liver Function Tests: Recent Labs  Lab 11/20/20 2144 11/21/20 1124 11/22/20 0635  AST 34 29 30  ALT 43 34 32  ALKPHOS 118 103 112  BILITOT 0.9 0.8 0.8  PROT 7.9 6.9 7.1  ALBUMIN 4.4 3.7 3.9   Recent Labs  Lab 11/20/20 2144  LIPASE 22   No results for input(s): AMMONIA in the last 168 hours. Coagulation Profile: No results for input(s): INR, PROTIME in the last 168 hours. Cardiac Enzymes: No results for input(s): CKTOTAL, CKMB, CKMBINDEX, TROPONINI in the last 168 hours. BNP (last 3 results) No results for input(s): PROBNP in the last 8760 hours. HbA1C: No results for input(s): HGBA1C in the last 72 hours. CBG: Recent Labs  Lab 11/20/20 2121 11/21/20 1448 11/22/20 1105 11/22/20 1625 11/22/20 2053  GLUCAP 141* 262* 256* 123* 263*   Lipid Profile: No results for input(s): CHOL, HDL, LDLCALC, TRIG, CHOLHDL, LDLDIRECT in the last 72 hours. Thyroid Function Tests: No results for input(s): TSH, T4TOTAL, FREET4, T3FREE, THYROIDAB in the last 72 hours. Anemia Panel: Recent Labs    11/22/20 0858  TIBC 270  IRON 24*   Sepsis Labs: Recent Labs  Lab 11/20/20 2144 11/21/20 0207 11/22/20 0859  PROCALCITON  --   --  3.28  LATICACIDVEN 1.0 0.9  --     Recent Results (from the past 240 hour(s))  Resp Panel by RT-PCR (Flu A&B, Covid) Nasopharyngeal Swab     Status: None   Collection Time: 11/20/20 11:33 PM   Specimen: Nasopharyngeal Swab; Nasopharyngeal(NP) swabs in vial transport medium  Result Value Ref  Range Status   SARS Coronavirus 2 by RT PCR NEGATIVE NEGATIVE Final    Comment: (NOTE) SARS-CoV-2 target nucleic acids are NOT DETECTED.  The SARS-CoV-2 RNA is generally detectable in upper respiratory specimens during the acute phase of  infection. The lowest concentration of SARS-CoV-2 viral copies this assay can detect is 138 copies/mL. A negative result does not preclude SARS-Cov-2 infection and should not be used as the sole basis for treatment or other patient management decisions. A negative result may occur with  improper specimen collection/handling, submission of specimen other than nasopharyngeal swab, presence of viral mutation(s) within the areas targeted by this assay, and inadequate number of viral copies(<138 copies/mL). A negative result must be combined with clinical observations, patient history, and epidemiological information. The expected result is Negative.  Fact Sheet for Patients:  EntrepreneurPulse.com.au  Fact Sheet for Healthcare Providers:  IncredibleEmployment.be  This test is no t yet approved or cleared by the [place] FDA and  has been authorized for detection and/or diagnosis of SARS-CoV-2 by FDA under an Emergency Use Authorization (EUA). This EUA will remain  in effect (meaning this test can be used) for the duration of the COVID-19 declaration under Section 564(b)(1) of the Act, 21 U.S.C.section 360bbb-3(b)(1), unless the authorization is terminated  or revoked sooner.       Influenza A by PCR NEGATIVE NEGATIVE Final   Influenza B by PCR NEGATIVE NEGATIVE Final    Comment: (NOTE) The Xpert Xpress SARS-CoV-2/FLU/RSV plus assay is intended as an aid in the diagnosis of influenza from Nasopharyngeal swab specimens and should not be used as a sole basis for treatment. Nasal washings and aspirates are unacceptable for Xpert Xpress SARS-CoV-2/FLU/RSV testing.  Fact Sheet for  Patients: EntrepreneurPulse.com.au  Fact Sheet for Healthcare Providers: IncredibleEmployment.be  This test is not yet approved or cleared by the [place] FDA and has been authorized for detection and/or diagnosis of SARS-CoV-2 by FDA under an Emergency Use Authorization (EUA). This EUA will remain in effect (meaning this test can be used) for the duration of the COVID-19 declaration under Section 564(b)(1) of the Act, 21 U.S.C. section 360bbb-3(b)(1), unless the authorization is terminated or revoked.  Performed at [facility], [place] [address], [place], [place] [address]       Radiology Studies: NM Pulmonary Perfusion  Result Date: 11/21/2020 CLINICAL DATA:  Respiratory failure.  Elevated D-dimer. EXAM: NUCLEAR MEDICINE PERFUSION LUNG SCAN TECHNIQUE: Perfusion images were obtained in multiple projections after intravenous injection of radiopharmaceutical. Ventilation scans intentionally deferred if perfusion scan and chest x-ray adequate for interpretation during COVID 19 epidemic. RADIOPHARMACEUTICALS:  4.1 mCi Tc-82m MAA IV COMPARISON:  Chest x-ray November 20, 2020 FINDINGS: No segmental defects are identified. IMPRESSION: No evidence of pulmonary embolus. Electronically Signed   By: Dorise Bullion III M.D   On: 11/21/2020 15:04   DG CHEST PORT 1 VIEW  Result Date: 11/22/2020 CLINICAL DATA:  Shortness of breath EXAM: PORTABLE CHEST 1 VIEW COMPARISON:  Chest x-rays dated 11/20/2020 and 03/27/2018. FINDINGS: Patchy bilateral perihilar airspace opacities are slightly improved compared to most recent chest x-ray of 11/20/2020. no new lung findings. No pleural effusion or pneumothorax is seen. Stable cardiomegaly. IMPRESSION: 1. Patchy bilateral perihilar airspace opacities are slightly improved compared to chest x-ray of 11/20/2020. In the absence of fever, I favor CHF with interval improvement in fluid status. 2. Stable  cardiomegaly. Electronically Signed   By: Franki Cabot M.D.   On: 11/22/2020 04:42   ECHOCARDIOGRAM COMPLETE  Result Date: 11/21/2020    ECHOCARDIOGRAM REPORT   Patient Name:   ALIZEY NOREN Date of Exam: 08/22/8181 Medical Rec #:  [id number]      Height:       63.0 in Accession #:  [id number]     Weight:       109.6 lb Date of Birth:  09/30/1961     BSA:          1.497 m Patient Age:    59 years       BP:           148/50 mmHg Patient Gender: F              HR:           91 bpm. Exam Location:  Inpatient Procedure: 2D Echo, Color Doppler and Cardiac Doppler Indications:    A19.37 Acute diastolic (congestive) heart failure  History:        Patient has no prior history of Echocardiogram examinations.                 Risk Factors:Hypertension and Diabetes.  Sonographer:    Raquel Sarna Senior RDCS Referring Phys: [place]  1. Left ventricular ejection fraction, by estimation, is 65 to 70%. The left ventricle has normal function. The left ventricle has no regional wall motion abnormalities. Left ventricular diastolic parameters are consistent with Grade II diastolic dysfunction (pseudonormalization).  2. Right ventricular systolic function is normal. The right ventricular size is normal. There is mildly elevated pulmonary artery systolic pressure.  3. The mitral valve is grossly normal. Mild mitral valve regurgitation. No evidence of mitral stenosis.  4. The aortic valve is normal in structure. Aortic valve regurgitation is not visualized. No aortic stenosis is present.  5. The inferior vena cava is normal in size with greater than 50% respiratory variability, suggesting right atrial pressure of 3 mmHg. FINDINGS  Left Ventricle: Left ventricular ejection fraction, by estimation, is 65 to 70%. The left ventricle has normal function. The left ventricle has no regional wall motion abnormalities. The left ventricular internal cavity size was normal in size. There is  no left ventricular hypertrophy.  Left ventricular diastolic parameters are consistent with Grade II diastolic dysfunction (pseudonormalization). Right Ventricle: The right ventricular size is normal. No increase in right ventricular wall thickness. Right ventricular systolic function is normal. There is mildly elevated pulmonary artery systolic pressure. The tricuspid regurgitant velocity is 3.00  m/s, and with an assumed right atrial pressure of 8 mmHg, the estimated right ventricular systolic pressure is 90.2 mmHg. Left Atrium: Left atrial size was normal in size. Right Atrium: Right atrial size was normal in size. Pericardium: There is no evidence of pericardial effusion. Mitral Valve: The mitral valve is grossly normal. Mild mitral valve regurgitation. No evidence of mitral valve stenosis. MV peak gradient, 12.1 mmHg. The mean mitral valve gradient is 6.0 mmHg. Tricuspid Valve: The tricuspid valve is normal in structure. Tricuspid valve regurgitation is mild. Aortic Valve: The aortic valve is normal in structure. Aortic valve regurgitation is not visualized. No aortic stenosis is present. Pulmonic Valve: The pulmonic valve was normal in structure. Pulmonic valve regurgitation is not visualized. Aorta: The aortic root and ascending aorta are structurally normal, with no evidence of dilitation. Venous: The inferior vena cava is normal in size with greater than 50% respiratory variability, suggesting right atrial pressure of 3 mmHg. IAS/Shunts: The atrial septum is grossly normal.  LEFT VENTRICLE PLAX 2D LVIDd:         4.90 cm  Diastology LVIDs:         2.80 cm  LV e' medial:    6.85 cm/s LV PW:         0.90 cm  LV E/e' medial:  20.4 LV IVS:        1.10 cm  LV e' lateral:   4.68 cm/s LVOT diam:     1.70 cm  LV E/e' lateral: 29.9 LV SV:         50 LV SV Index:   34 LVOT Area:     2.27 cm  RIGHT VENTRICLE RV S prime:     15.20 cm/s TAPSE (M-mode): 2.4 cm LEFT ATRIUM             Index       RIGHT ATRIUM           Index LA diam:        3.90 cm 2.60  cm/m  RA Area:     14.30 cm LA Vol (A2C):   60.7 ml 40.54 ml/m RA Volume:   33.20 ml  22.17 ml/m LA Vol (A4C):   37.4 ml 24.98 ml/m LA Biplane Vol: 49.1 ml 32.79 ml/m  AORTIC VALVE LVOT Vmax:   116.00 cm/s LVOT Vmean:  81.500 cm/s LVOT VTI:    0.222 m  AORTA Ao Root diam: 2.70 cm Ao Asc diam:  2.60 cm MITRAL VALVE                TRICUSPID VALVE MV Area (PHT): 3.42 cm     TR Peak grad:   36.0 mmHg MV Peak grad:  12.1 mmHg    TR Vmax:        300.00 cm/s MV Mean grad:  6.0 mmHg MV Vmax:       1.74 m/s     SHUNTS MV Vmean:      113.0 cm/s   Systemic VTI:  0.22 m MV Decel Time: 222 msec     Systemic Diam: 1.70 cm MV E velocity: 140.00 cm/s MV A velocity: 106.00 cm/s MV E/A ratio:  1.32 Mertie Moores MD Electronically signed by Mertie Moores MD Signature Date/Time: 11/21/2020/12:18:25 PM    Final    VAS [place] LOWER EXTREMITY VENOUS (DVT)  Result Date: 11/22/2020  Lower Venous DVT Study Indications: Elevated ddimer, Swelling, and Edema.  Comparison Study: no prior Performing Technologist: Abram Sander RVS  Examination Guidelines: A complete evaluation includes B-mode imaging, spectral Doppler, color Doppler, and power Doppler as needed of all accessible portions of each vessel. Bilateral testing is considered an integral part of a complete examination. Limited examinations for reoccurring indications may be performed as noted. The reflux portion of the exam is performed with the patient in reverse Trendelenburg.  +---------+---------------+---------+-----------+----------+--------------+ RIGHT    CompressibilityPhasicitySpontaneityPropertiesThrombus Aging +---------+---------------+---------+-----------+----------+--------------+ CFV      Full           Yes      Yes                                 +---------+---------------+---------+-----------+----------+--------------+ SFJ      Full                                                         +---------+---------------+---------+-----------+----------+--------------+ FV Prox  Full                                                        +---------+---------------+---------+-----------+----------+--------------+  FV Mid   Full                                                        +---------+---------------+---------+-----------+----------+--------------+ FV DistalFull                                                        +---------+---------------+---------+-----------+----------+--------------+ PFV      Full                                                        +---------+---------------+---------+-----------+----------+--------------+ POP      Full           Yes      Yes                                 +---------+---------------+---------+-----------+----------+--------------+ PTV      Full                                                        +---------+---------------+---------+-----------+----------+--------------+ PERO     Full                                                        +---------+---------------+---------+-----------+----------+--------------+   +---------+---------------+---------+-----------+----------+--------------+ LEFT     CompressibilityPhasicitySpontaneityPropertiesThrombus Aging +---------+---------------+---------+-----------+----------+--------------+ CFV      Full           Yes      Yes                                 +---------+---------------+---------+-----------+----------+--------------+ SFJ      Full                                                        +---------+---------------+---------+-----------+----------+--------------+ FV Prox  Full                                                        +---------+---------------+---------+-----------+----------+--------------+ FV Mid   Full                                                         +---------+---------------+---------+-----------+----------+--------------+  FV DistalFull                                                        +---------+---------------+---------+-----------+----------+--------------+ PFV      Full                                                        +---------+---------------+---------+-----------+----------+--------------+ POP      Full           Yes      Yes                                 +---------+---------------+---------+-----------+----------+--------------+ PTV      Full                                                        +---------+---------------+---------+-----------+----------+--------------+ PERO     Full                                                        +---------+---------------+---------+-----------+----------+--------------+     Summary: BILATERAL: - No evidence of deep vein thrombosis seen in the lower extremities, bilaterally. - No evidence of superficial venous thrombosis in the lower extremities, bilaterally. -No evidence of popliteal cyst, bilaterally.   *See table(s) above for measurements and observations. Electronically signed by Harold Barban MD on 11/22/2020 at 5:36:32 PM.    Final      Scheduled Meds: . sodium chloride   Intravenous Once  . amLODipine  10 mg Oral Daily  . aspirin  81 mg Oral Daily  . calcium carbonate  1 tablet Oral TID WC  . fluticasone  2 spray Each Nare Daily  . hydrALAZINE  100 mg Oral Q8H  . hydrocortisone  10 mg Oral BID  . insulin aspart  0-5 Units Subcutaneous QHS  . insulin aspart  0-9 Units Subcutaneous TID WC  . levothyroxine  150 mcg Oral Q0600  . melatonin  5 mg Oral QHS  . multivitamin with minerals  1 tablet Oral Daily  . pantoprazole  40 mg Oral Daily  . polyethylene glycol  17 g Oral BID  . senna-docusate  1 tablet Oral BID  . sodium chloride flush  3 mL Intravenous Q12H   Continuous Infusions: . sodium chloride    . azithromycin 500 mg (11/22/20  1713)  . cefTRIAXone (ROCEPHIN)  IV 1 g (11/22/20 1637)     LOS: 2 days    Time spent: 25 minutes spent in the coordination of care today.    Jonnie Finner, DO [facility]  If 7PM-7AM, please contact night-coverage [email or web address] 11/23/2020, 7:24 AM

## 2020-11-24 LAB — TYPE AND SCREEN
ABO/RH(D): A POS
Antibody Screen: NEGATIVE
Unit division: 0

## 2020-11-24 LAB — BPAM RBC
Blood Product Expiration Date: 202112302359
ISSUE DATE / TIME: 202112051104
Unit Type and Rh: 6200

## 2020-11-24 LAB — BASIC METABOLIC PANEL
Anion gap: 12 (ref 5–15)
BUN: 48 mg/dL — ABNORMAL HIGH (ref 8–23)
CO2: 21 mmol/L — ABNORMAL LOW (ref 22–32)
Calcium: 8.5 mg/dL — ABNORMAL LOW (ref 8.9–10.3)
Chloride: 100 mmol/L (ref 98–111)
Creatinine, Ser: 2.49 mg/dL — ABNORMAL HIGH (ref 0.44–1.00)
GFR, Estimated: 21 mL/min — ABNORMAL LOW (ref >60)
Glucose, Bld: 149 mg/dL — ABNORMAL HIGH (ref 70–99)
Potassium: 3.7 mmol/L (ref 3.5–5.1)
Sodium: 133 mmol/L — ABNORMAL LOW (ref 135–145)

## 2020-11-24 LAB — GLUCOSE, CAPILLARY
Glucose-Capillary: 139 mg/dL — ABNORMAL HIGH (ref 70–99)
Glucose-Capillary: 180 mg/dL — ABNORMAL HIGH (ref 70–99)

## 2020-11-24 MED ORDER — HYDRALAZINE HCL 100 MG PO TABS
100.0000 mg | ORAL_TABLET | Freq: Three times a day (TID) | ORAL | 3 refills | Status: DC
Start: 2020-11-24 — End: 2021-03-30

## 2020-11-24 MED ORDER — CEFDINIR 300 MG PO CAPS: 300.0000 mg | ORAL_CAPSULE | Freq: Every day | ORAL | 0 refills | Status: AC

## 2020-11-24 MED ORDER — ISOSORBIDE MONONITRATE ER 30 MG PO TB24
30.0000 mg | ORAL_TABLET | Freq: Every day | ORAL | 3 refills | Status: DC
Start: 2020-11-24 — End: 2021-03-30

## 2020-11-24 MED ORDER — METOPROLOL SUCCINATE ER 25 MG PO TB24
12.5000 mg | ORAL_TABLET | Freq: Every day | ORAL | 3 refills | Status: DC
Start: 2020-11-24 — End: 2021-03-30

## 2020-11-24 NOTE — Plan of Care (Signed)
  Problem: Health Behavior/Discharge Planning: Goal: Ability to manage health-related needs will improve Outcome: Adequate for Discharge   Problem: Clinical Measurements: Goal: Ability to maintain clinical measurements within normal limits will improve Outcome: Adequate for Discharge Goal: Will remain free from infection Outcome: Adequate for Discharge Goal: Diagnostic test results will improve Outcome: Adequate for Discharge Goal: Respiratory complications will improve Outcome: Adequate for Discharge Goal: Cardiovascular complication will be avoided Outcome: Adequate for Discharge   Problem: Activity: Goal: Risk for activity intolerance will decrease Outcome: Adequate for Discharge   Problem: Nutrition: Goal: Adequate nutrition will be maintained Outcome: Adequate for Discharge   Problem: Coping: Goal: Level of anxiety will decrease Outcome: Adequate for Discharge   Problem: Elimination: Goal: Will not experience complications related to bowel motility Outcome: Adequate for Discharge Goal: Will not experience complications related to urinary retention Outcome: Adequate for Discharge   Problem: Pain Managment: Goal: General experience of comfort will improve Outcome: Adequate for Discharge   Problem: Safety: Goal: Ability to remain free from injury will improve Outcome: Adequate for Discharge   Problem: Skin Integrity: Goal: Risk for impaired skin integrity will decrease Outcome: Adequate for Discharge   Problem: Education: Goal: Ability to demonstrate management of disease process will improve Outcome: Adequate for Discharge Goal: Ability to verbalize understanding of medication therapies will improve Outcome: Adequate for Discharge Goal: Individualized Educational Video(s) Outcome: Adequate for Discharge   Problem: Activity: Goal: Capacity to carry out activities will improve Outcome: Adequate for Discharge   Problem: Cardiac: Goal: Ability to achieve and  maintain adequate cardiopulmonary perfusion will improve Outcome: Adequate for Discharge   Problem: Education: Goal: Ability to describe self-care measures that may prevent or decrease complications (Diabetes Survival Skills Education) will improve Outcome: Adequate for Discharge Goal: Individualized Educational Video(s) Outcome: Adequate for Discharge   Problem: Coping: Goal: Ability to adjust to condition or change in health will improve Outcome: Adequate for Discharge   Problem: Fluid Volume: Goal: Ability to maintain a balanced intake and output will improve Outcome: Adequate for Discharge   Problem: Health Behavior/Discharge Planning: Goal: Ability to identify and utilize available resources and services will improve Outcome: Adequate for Discharge Goal: Ability to manage health-related needs will improve Outcome: Adequate for Discharge   Problem: Metabolic: Goal: Ability to maintain appropriate glucose levels will improve Outcome: Adequate for Discharge   Problem: Nutritional: Goal: Maintenance of adequate nutrition will improve Outcome: Adequate for Discharge Goal: Progress toward achieving an optimal weight will improve Outcome: Adequate for Discharge   Problem: Skin Integrity: Goal: Risk for impaired skin integrity will decrease Outcome: Adequate for Discharge   Problem: Tissue Perfusion: Goal: Adequacy of tissue perfusion will improve Outcome: Adequate for Discharge

## 2020-11-25 ENCOUNTER — Other Ambulatory Visit: Payer: Self-pay | Admitting: Family Medicine

## 2020-11-25 ENCOUNTER — Encounter: Payer: Self-pay | Admitting: Family Medicine

## 2020-11-25 ENCOUNTER — Telehealth: Payer: Self-pay

## 2020-11-25 MED ORDER — HYDROCORTISONE 10 MG PO TABS
10.0000 mg | ORAL_TABLET | Freq: Two times a day (BID) | ORAL | 0 refills | Status: DC
Start: 2020-11-25 — End: 2020-11-26

## 2020-11-25 NOTE — Telephone Encounter (Signed)
Transition Care Management Follow-up Telephone Call  Call completed with patient's daughter, [name].  DPR on file to speak with her.  [name] was at work and not with her mother at the time of this call.   Date of discharge and from where: 11/24/2020, [facility]   How have you been since you were released from the hospital? [name] said that her sibling was with her mother. They have an oximeter for her mother and her sibling said that her mother's O2 level was dropping.  It was 89% earlier today.  Her bother had purchased O2 for his mother and they currently have her on the O2. [name] was not sure of her current O2 sat that warranted O2. She said that her mother has not complained of shortness of breath. She was not sure where her brother got the O2 or how many liters/min her mother is using.O2 was not ordered at discharge.  She said she is waiting for a call back from her sibling with a report of her mother's status.  She said that after she receives this information she will decide if she needs to call EMS  For her mother or contact Dr Margarita Rana for advice.   Any questions or concerns? Yes - noted above.   She also would is requesting a refill of the hydrocortisone.  It is still listed in the AVS but her mother has run out.   Items Reviewed:  Did the pt receive and understand the discharge instructions provided? Yes   Medications obtained and verified? yes, she said that they have all medications, including the new ones,  except the hydrocortisone as noted above. no questions about the med regime at this time  Other? No   Any new allergies since your discharge? No   Do you have support at home? Yes  - she has a family member with her since she has been home.  She is staying at her son's house.   Home Care and Equipment/Supplies: Were home health services ordered? NO If so, what is the name of the agency? n/a Has the agency set up a time to come to the patient's home? n/a Were any new  equipment or medical supplies ordered?  No What is the name of the medical supply agency? n/a Were you able to get the supplies/equipment?n/a Do you have any questions related to the use of the equipment or supplies? No, n/a.  They have purchased a scale for her mother to check daily weights.  She did not weigh herself this morning but [name] said that they have the instructions noting when to call the provider about weight gain.  A referral has been made to outpatient cardiac rehab.   Functional Questionnaire: (I = Independent and D = Dependent) ADLs: independent  Follow up appointments reviewed:   PCP Hospital f/u appt confirmed? Yes  - Dr Margarita Rana 12/02/2020  [facility] f/u appt confirmed? none scheduled at this time  Are transportation arrangements needed? No , family drives  If their condition worsens, is the pt aware to call PCP or go to the Emergency Dept.? Yes  Was the patient provided with contact information for the PCP's office or ED? They have the phone number for [facility] and her daughter has access to [place]  Was to pt encouraged to call back with questions or concerns? yes

## 2020-11-25 NOTE — Telephone Encounter (Signed)
Her daughter has been sending me 'my chart' messages as well and I have refilled the Hydrocortisone. She was discharged with an O2 sat of 91% and would not qualify for O2 unless she has sats of 88% or lower and this can be addressed at her office visit to assess if she would qualify. I am not sure how they came about Oxygen when she was not discharged on it. If she is dyspneic I would recommend going to the ED

## 2020-11-25 NOTE — Discharge Summary (Signed)
Physician Discharge Summary   [name] SEG:[id number] DOB: 1961/05/04 DOA: 11/20/2020  PCP: Charlott Rakes, MD  Admit date: 11/20/2020 Discharge date: 11/25/2020  Admitted From: home Disposition:  home Discharging physician: Dwyane Dee, MD  Recommendations for Outpatient Follow-up:  1. Needs referral to cardiology for new diagnosis dCHF 2. Adjust BP meds as needed   Patient discharged to home in Discharge Condition: stable CODE STATUS: Full Diet recommendation:  Diet Orders (From admission, onward)    Start     Ordered   11/24/20 0000  Diet - low sodium heart healthy        11/24/20 1022          Hospital Course: [name] is a 59 year old female with a past medical history significant for but not limited to hypertension, history of chronic kidney disease stage IIIb,, history of adrenal insufficiency on Cortef, hypothyroidism as well as other comorbidities who presented to the ED with a chief complaint of 3-day of history of severe diffuse abdominal pain and bloating also showed with no bowel movement for 3 days.  She underwent a CT abdomen/pelvis on admission which showed some mildly dilated loops of small bowel but no obstruction nor significant constipation.  A right upper quadrant ultrasound was also obtained which showed no findings to support her abdominal complaints.  A VQ scan was also obtained which showed low probability of PE. Bilateral lower extremity duplex was also negative for DVT. She then underwent an echo which showed normal EF, 65 to 70% and grade 2 diastolic dysfunction.  The diastolic dysfunction was a new diagnosis for the patient.  She was started on amlodipine, hydralazine, Imdur as well as beta-blocker.  Her renal function was too poor for addition of ACE or ARB.   She was also treated for what appears to be an underlying pneumonia.  She responded fairly well to azithromycin, Rocephin, Mucinex.  She was discharged with cefdinir to complete  course outpatient as well.  She overall improved clinically and was considered stable for discharging home with outpatient follow-up with PCP and cardiology referral.  The patient's chronic medical conditions were treated accordingly per the patient's home medication regimen except as noted.  On day of discharge, patient was felt deemed stable for discharge. Patient/family member advised to call PCP or come back to ER if needed.   Principal Diagnosis: New onset of congestive heart failure ([place])  Discharge Diagnoses: Active Hospital Problems   Diagnosis Date Noted  . New onset of congestive heart failure ([place]) 11/21/2020  . CKD (chronic kidney disease) stage 4, GFR 15-29 ml/min ([facility]) 11/21/2020  . HTN (hypertension) 11/21/2020  . Adrenal insufficiency ([place]) 03/27/2018    Resolved Hospital Problems  No resolved problems to display.    Discharge Instructions    Amb Referral to Cardiac Rehabilitation   Complete by: As directed    Diagnosis: Heart Failure (see criteria below if ordering Phase II)   Heart Failure Type: Chronic Diastolic   After initial evaluation and assessments completed: Virtual Based Care may be provided alone or in conjunction with Phase 2 Cardiac Rehab based on patient barriers.: Yes   Diet - low sodium heart healthy   Complete by: As directed    Increase activity slowly   Complete by: As directed      Allergies as of 11/24/2020      Reactions   Percocet [oxycodone-acetaminophen] Rash      Medication List    TAKE these medications   acetaminophen 325 MG tablet Commonly  known as: TYLENOL Take 2 tablets (650 mg total) by mouth every 6 (six) hours as needed for mild pain.   amLODipine 10 MG tablet Commonly known as: NORVASC Take 1 tablet (10 mg total) by mouth daily.   aspirin 81 MG chewable tablet Chew 81 mg by mouth daily.   CALCIUM 1200 PO Take 1,200 mg by mouth daily.   cefdinir 300 MG capsule Commonly known as: OMNICEF Take 1 capsule (300 mg  total) by mouth daily for 3 days.   fluticasone 50 MCG/ACT nasal spray Commonly known as: FLONASE Place 2 sprays into both nostrils daily.   hydrALAZINE 100 MG tablet Commonly known as: APRESOLINE Take 1 tablet (100 mg total) by mouth 3 (three) times daily. What changed: when to take this   ibuprofen 600 MG tablet Commonly known as: ADVIL Take 1 tablet (600 mg total) by mouth every 6 (six) hours as needed. What changed: reasons to take this   isosorbide mononitrate 30 MG 24 hr tablet Commonly known as: IMDUR Take 1 tablet (30 mg total) by mouth daily.   levothyroxine 150 MCG tablet Commonly known as: SYNTHROID Take 1 tablet (150 mcg total) by mouth daily before breakfast.   metoprolol succinate 25 MG 24 hr tablet Commonly known as: TOPROL-XL Take 0.5 tablets (12.5 mg total) by mouth daily.   multivitamin with minerals Tabs tablet Take 1 tablet by mouth daily.   polyethylene glycol 17 g packet Commonly known as: MIRALAX / GLYCOLAX Take 17 g by mouth daily as needed.       Allergies  Allergen Reactions  . Percocet [Oxycodone-Acetaminophen] Rash    Consultations:   Discharge Exam: BP (!) 161/52   Pulse 73   Temp 98.5 F (36.9 C) (Oral)   Resp 18   Ht 5\' 2"  (1.575 m)   Wt 50.7 kg   SpO2 91%   BMI 20.44 kg/m  General appearance: alert, cooperative and no distress Head: Normocephalic, without obvious abnormality, atraumatic Eyes: EOMI Lungs: clear to auscultation bilaterally Heart: regular rate and rhythm and S1, S2 normal Abdomen: normal findings: bowel sounds normal and soft, non-tender Extremities: no edema Skin: mobility and turgor normal Neurologic: Grossly normal  The results of significant diagnostics from this hospitalization (including imaging, microbiology, ancillary and laboratory) are listed below for reference.   Microbiology: Recent Results (from the past 240 hour(s))  Resp Panel by RT-PCR (Flu A&B, Covid) Nasopharyngeal Swab     Status:  None   Collection Time: 11/20/20 11:33 PM   Specimen: Nasopharyngeal Swab; Nasopharyngeal(NP) swabs in vial transport medium  Result Value Ref Range Status   SARS Coronavirus 2 by RT PCR NEGATIVE NEGATIVE Final    Comment: (NOTE) SARS-CoV-2 target nucleic acids are NOT DETECTED.  The SARS-CoV-2 RNA is generally detectable in upper respiratory specimens during the acute phase of infection. The lowest concentration of SARS-CoV-2 viral copies this assay can detect is 138 copies/mL. A negative result does not preclude SARS-Cov-2 infection and should not be used as the sole basis for treatment or other patient management decisions. A negative result may occur with  improper specimen collection/handling, submission of specimen other than nasopharyngeal swab, presence of viral mutation(s) within the areas targeted by this assay, and inadequate number of viral copies(<138 copies/mL). A negative result must be combined with clinical observations, patient history, and epidemiological information. The expected result is Negative.  Fact Sheet for Patients:  EntrepreneurPulse.com.au  Fact Sheet for Healthcare Providers:  IncredibleEmployment.be  This test is no t yet approved  or cleared by the [place] and  has been authorized for detection and/or diagnosis of SARS-CoV-2 by FDA under an Emergency Use Authorization (EUA). This EUA will remain  in effect (meaning this test can be used) for the duration of the COVID-19 declaration under Section 564(b)(1) of the Act, 21 U.S.C.section 360bbb-3(b)(1), unless the authorization is terminated  or revoked sooner.       Influenza A by PCR NEGATIVE NEGATIVE Final   Influenza B by PCR NEGATIVE NEGATIVE Final    Comment: (NOTE) The Xpert Xpress SARS-CoV-2/FLU/RSV plus assay is intended as an aid in the diagnosis of influenza from Nasopharyngeal swab specimens and should not be used as a sole basis for  treatment. Nasal washings and aspirates are unacceptable for Xpert Xpress SARS-CoV-2/FLU/RSV testing.  Fact Sheet for Patients: EntrepreneurPulse.com.au  Fact Sheet for Healthcare Providers: IncredibleEmployment.be  This test is not yet approved or cleared by the [place] FDA and has been authorized for detection and/or diagnosis of SARS-CoV-2 by FDA under an Emergency Use Authorization (EUA). This EUA will remain in effect (meaning this test can be used) for the duration of the COVID-19 declaration under Section 564(b)(1) of the Act, 21 U.S.C. section 360bbb-3(b)(1), unless the authorization is terminated or revoked.  Performed at [facility], [place] [address], [place], [place] [address]      Labs: BNP (last 3 results) Recent Labs    11/20/20 2333  BNP 283.1*   Basic Metabolic Panel: Recent Labs  Lab 11/20/20 2144 11/21/20 1124 11/22/20 0635 11/23/20 0629 11/24/20 0509  NA 134* 135 130* 132* 133*  K 4.5 4.0 3.5 3.7 3.7  CL 106 104 98 99 100  CO2 18* 19* 21* 21* 21*  GLUCOSE 159* 144* 215* 191* 149*  BUN 42* 44* 51* 48* 48*  CREATININE 2.18* 2.52* 2.89* 2.51* 2.49*  CALCIUM 8.8* 8.1* 8.3* 8.3* 8.5*  MG  --  2.1 2.2  --   --   PHOS  --  4.1 3.2  --   --    Liver Function Tests: Recent Labs  Lab 11/20/20 2144 11/21/20 1124 11/22/20 0635  AST 34 29 30  ALT 43 34 32  ALKPHOS 118 103 112  BILITOT 0.9 0.8 0.8  PROT 7.9 6.9 7.1  ALBUMIN 4.4 3.7 3.9   Recent Labs  Lab 11/20/20 2144  LIPASE 22   No results for input(s): AMMONIA in the last 168 hours. CBC: Recent Labs  Lab 11/20/20 2144 11/21/20 1124 11/22/20 0635 11/23/20 0629 11/23/20 1603  WBC 13.2* 13.3* 14.3* 10.7*  --   NEUTROABS  --  11.8* 11.0* 8.2*  --   HGB 8.2* 7.1* 7.0* 6.8* 8.6*  HCT 25.8* 22.8* 22.0* 21.2* 25.9*  MCV 94.9 96.2 94.0 93.4  --   PLT 146* 139* 165 152  --    Cardiac Enzymes: No results for input(s): CKTOTAL,  CKMB, CKMBINDEX, TROPONINI in the last 168 hours. BNP: Invalid input(s): POCBNP CBG: Recent Labs  Lab 11/23/20 1220 11/23/20 1633 11/23/20 2040 11/24/20 0728 11/24/20 1116  GLUCAP 209* 258* 98 139* 180*   D-Dimer No results for input(s): DDIMER in the last 72 hours. Hgb A1c No results for input(s): HGBA1C in the last 72 hours. Lipid Profile No results for input(s): CHOL, HDL, LDLCALC, TRIG, CHOLHDL, LDLDIRECT in the last 72 hours. Thyroid function studies No results for input(s): TSH, T4TOTAL, T3FREE, THYROIDAB in the last 72 hours.  Invalid input(s): FREET3 Anemia work up No results for input(s): VITAMINB12, FOLATE,  FERRITIN, TIBC, IRON, RETICCTPCT in the last 72 hours. Urinalysis    Component Value Date/Time   COLORURINE YELLOW 11/21/2020 0926   APPEARANCEUR CLEAR 11/21/2020 0926   LABSPEC 1.009 11/21/2020 0926   PHURINE 5.0 11/21/2020 0926   GLUCOSEU NEGATIVE 11/21/2020 0926   HGBUR NEGATIVE 11/21/2020 0926   BILIRUBINUR NEGATIVE 11/21/2020 0926   KETONESUR NEGATIVE 11/21/2020 0926   PROTEINUR NEGATIVE 11/21/2020 0926   UROBILINOGEN 0.2 06/12/2011 1904   NITRITE NEGATIVE 11/21/2020 0926   LEUKOCYTESUR NEGATIVE 11/21/2020 0926   Sepsis Labs Invalid input(s): PROCALCITONIN,  WBC,  LACTICIDVEN Microbiology Recent Results (from the past 240 hour(s))  Resp Panel by RT-PCR (Flu A&B, Covid) Nasopharyngeal Swab     Status: None   Collection Time: 11/20/20 11:33 PM   Specimen: Nasopharyngeal Swab; Nasopharyngeal(NP) swabs in vial transport medium  Result Value Ref Range Status   SARS Coronavirus 2 by RT PCR NEGATIVE NEGATIVE Final    Comment: (NOTE) SARS-CoV-2 target nucleic acids are NOT DETECTED.  The SARS-CoV-2 RNA is generally detectable in upper respiratory specimens during the acute phase of infection. The lowest concentration of SARS-CoV-2 viral copies this assay can detect is 138 copies/mL. A negative result does not preclude SARS-Cov-2 infection and should  not be used as the sole basis for treatment or other patient management decisions. A negative result may occur with  improper specimen collection/handling, submission of specimen other than nasopharyngeal swab, presence of viral mutation(s) within the areas targeted by this assay, and inadequate number of viral copies(<138 copies/mL). A negative result must be combined with clinical observations, patient history, and epidemiological information. The expected result is Negative.  Fact Sheet for Patients:  EntrepreneurPulse.com.au  Fact Sheet for Healthcare Providers:  IncredibleEmployment.be  This test is no t yet approved or cleared by the [place] FDA and  has been authorized for detection and/or diagnosis of SARS-CoV-2 by FDA under an Emergency Use Authorization (EUA). This EUA will remain  in effect (meaning this test can be used) for the duration of the COVID-19 declaration under Section 564(b)(1) of the Act, 21 U.S.C.section 360bbb-3(b)(1), unless the authorization is terminated  or revoked sooner.       Influenza A by PCR NEGATIVE NEGATIVE Final   Influenza B by PCR NEGATIVE NEGATIVE Final    Comment: (NOTE) The Xpert Xpress SARS-CoV-2/FLU/RSV plus assay is intended as an aid in the diagnosis of influenza from Nasopharyngeal swab specimens and should not be used as a sole basis for treatment. Nasal washings and aspirates are unacceptable for Xpert Xpress SARS-CoV-2/FLU/RSV testing.  Fact Sheet for Patients: EntrepreneurPulse.com.au  Fact Sheet for Healthcare Providers: IncredibleEmployment.be  This test is not yet approved or cleared by the [place] FDA and has been authorized for detection and/or diagnosis of SARS-CoV-2 by FDA under an Emergency Use Authorization (EUA). This EUA will remain in effect (meaning this test can be used) for the duration of the COVID-19 declaration under  Section 564(b)(1) of the Act, 21 U.S.C. section 360bbb-3(b)(1), unless the authorization is terminated or revoked.  Performed at [facility], [place] [address] [place], [place] [address]     Procedures/Studies: CT ABDOMEN PELVIS WO CONTRAST  Result Date: 11/20/2020 CLINICAL DATA:  Acute abdominal pain and history suggestive of constipation EXAM: CT ABDOMEN AND PELVIS WITHOUT CONTRAST TECHNIQUE: Multidetector CT imaging of the abdomen and pelvis was performed following the standard protocol without IV contrast. COMPARISON:  12/09/2017 FINDINGS: Lower chest: Small pleural effusions are noted right greater than left with associated atelectatic  changes. Hepatobiliary: No focal liver abnormality is seen. No gallstones, gallbladder wall thickening, or biliary dilatation. Pancreas: Unremarkable. No pancreatic ductal dilatation or surrounding inflammatory changes. Spleen: Normal in size without focal abnormality. Adrenals/Urinary Tract: Adrenal glands are within normal limits. Kidneys are well visualized with prominent right extrarenal pelvis. No obstructive changes are seen. The bladder is well distended. Stomach/Bowel: Mild fecal material is noted within the colon without definitive obstructive change. No findings to suggest constipation are identified. The appendix is not well visualized. No inflammatory changes to suggest appendicitis are noted. Stomach is decompressed. A few mildly dilated loops of small bowel are noted without definitive obstructive change. This may be related to mild enteritis. Vascular/Lymphatic: Aortic atherosclerosis. No enlarged abdominal or pelvic lymph nodes. Reproductive: Uterus and bilateral adnexa are unremarkable. Other: No abdominal wall hernia or abnormality. No abdominopelvic ascites. Musculoskeletal: No acute or significant osseous findings. IMPRESSION: Bilateral pleural effusions right greater than left with associated atelectatic changes. A few mildly  dilated loops of small bowel without definitive obstructive change. This likely represents a mild enteritis. No findings to suggest constipation are noted. Electronically Signed   By: Inez Catalina M.D.   On: 11/20/2020 23:06   CT HEAD WO CONTRAST  Result Date: 11/20/2020 CLINICAL DATA:  Increasing headaches EXAM: CT HEAD WITHOUT CONTRAST TECHNIQUE: Contiguous axial images were obtained from the base of the skull through the vertex without intravenous contrast. COMPARISON:  01/28/2020 FINDINGS: Brain: Ventricles are again prominent. No findings to suggest acute hemorrhage, acute infarction or space-occupying mass lesion are noted. Vascular: No hyperdense vessel or unexpected calcification. Skull: Normal. Negative for fracture or focal lesion. Sinuses/Orbits: No acute finding. Other: None. IMPRESSION: Prominent ventricular system stable from the prior exam. No new focal abnormality is noted. Electronically Signed   By: Inez Catalina M.D.   On: 11/20/2020 23:08   NM Pulmonary Perfusion  Result Date: 11/21/2020 CLINICAL DATA:  Respiratory failure.  Elevated D-dimer. EXAM: NUCLEAR MEDICINE PERFUSION LUNG SCAN TECHNIQUE: Perfusion images were obtained in multiple projections after intravenous injection of radiopharmaceutical. Ventilation scans intentionally deferred if perfusion scan and chest x-ray adequate for interpretation during COVID 19 epidemic. RADIOPHARMACEUTICALS:  4.1 mCi Tc-41m MAA IV COMPARISON:  Chest x-ray November 20, 2020 FINDINGS: No segmental defects are identified. IMPRESSION: No evidence of pulmonary embolus. Electronically Signed   By: Dorise Bullion III M.D   On: 11/21/2020 15:04   DG CHEST PORT 1 VIEW  Result Date: 11/22/2020 CLINICAL DATA:  Shortness of breath EXAM: PORTABLE CHEST 1 VIEW COMPARISON:  Chest x-rays dated 11/20/2020 and 03/27/2018. FINDINGS: Patchy bilateral perihilar airspace opacities are slightly improved compared to most recent chest x-ray of 11/20/2020. no new lung  findings. No pleural effusion or pneumothorax is seen. Stable cardiomegaly. IMPRESSION: 1. Patchy bilateral perihilar airspace opacities are slightly improved compared to chest x-ray of 11/20/2020. In the absence of fever, I favor CHF with interval improvement in fluid status. 2. Stable cardiomegaly. Electronically Signed   By: Franki Cabot M.D.   On: 11/22/2020 04:42   DG Chest Port 1 View  Result Date: 11/20/2020 CLINICAL DATA:  59 year old female with shortness of breath. EXAM: PORTABLE CHEST 1 VIEW COMPARISON:  Chest radiograph dated 03/27/2018. FINDINGS: There is cardiomegaly with vascular congestion and edema. Superimposed pneumonia is not excluded clinical correlation is recommended. No large pleural effusion or pneumothorax. No acute osseous pathology. IMPRESSION: Cardiomegaly with findings of CHF. Superimposed pneumonia is not excluded. Electronically Signed   By: Anner Crete M.D.   On: 11/20/2020  23:46   ECHOCARDIOGRAM COMPLETE  Result Date: 11/21/2020    ECHOCARDIOGRAM REPORT   Patient Name:   [name] Date of Exam: [birth year] Medical Rec #:  [id number]      Height:       63.0 in Accession #:    [id number]     Weight:       109.6 lb Date of Birth:  1961-07-16     BSA:          1.497 m Patient Age:    28 years       BP:           148/50 mmHg Patient Gender: F              HR:           91 bpm. Exam Location:  Inpatient Procedure: 2D Echo, Color Doppler and Cardiac Doppler Indications:    X38.18 Acute diastolic (congestive) heart failure  History:        Patient has no prior history of Echocardiogram examinations.                 Risk Factors:Hypertension and Diabetes.  Sonographer:    Raquel Sarna Senior RDCS Referring Phys: [place]  1. Left ventricular ejection fraction, by estimation, is 65 to 70%. The left ventricle has normal function. The left ventricle has no regional wall motion abnormalities. Left ventricular diastolic parameters are consistent with Grade II  diastolic dysfunction (pseudonormalization).  2. Right ventricular systolic function is normal. The right ventricular size is normal. There is mildly elevated pulmonary artery systolic pressure.  3. The mitral valve is grossly normal. Mild mitral valve regurgitation. No evidence of mitral stenosis.  4. The aortic valve is normal in structure. Aortic valve regurgitation is not visualized. No aortic stenosis is present.  5. The inferior vena cava is normal in size with greater than 50% respiratory variability, suggesting right atrial pressure of 3 mmHg. FINDINGS  Left Ventricle: Left ventricular ejection fraction, by estimation, is 65 to 70%. The left ventricle has normal function. The left ventricle has no regional wall motion abnormalities. The left ventricular internal cavity size was normal in size. There is  no left ventricular hypertrophy. Left ventricular diastolic parameters are consistent with Grade II diastolic dysfunction (pseudonormalization). Right Ventricle: The right ventricular size is normal. No increase in right ventricular wall thickness. Right ventricular systolic function is normal. There is mildly elevated pulmonary artery systolic pressure. The tricuspid regurgitant velocity is 3.00  m/s, and with an assumed right atrial pressure of 8 mmHg, the estimated right ventricular systolic pressure is 29.9 mmHg. Left Atrium: Left atrial size was normal in size. Right Atrium: Right atrial size was normal in size. Pericardium: There is no evidence of pericardial effusion. Mitral Valve: The mitral valve is grossly normal. Mild mitral valve regurgitation. No evidence of mitral valve stenosis. MV peak gradient, 12.1 mmHg. The mean mitral valve gradient is 6.0 mmHg. Tricuspid Valve: The tricuspid valve is normal in structure. Tricuspid valve regurgitation is mild. Aortic Valve: The aortic valve is normal in structure. Aortic valve regurgitation is not visualized. No aortic stenosis is present. Pulmonic Valve:  The pulmonic valve was normal in structure. Pulmonic valve regurgitation is not visualized. Aorta: The aortic root and ascending aorta are structurally normal, with no evidence of dilitation. Venous: The inferior vena cava is normal in size with greater than 50% respiratory variability, suggesting right atrial pressure of 3 mmHg. IAS/Shunts: The atrial septum is grossly  normal.  LEFT VENTRICLE PLAX 2D LVIDd:         4.90 cm  Diastology LVIDs:         2.80 cm  LV e' medial:    6.85 cm/s LV PW:         0.90 cm  LV E/e' medial:  20.4 LV IVS:        1.10 cm  LV e' lateral:   4.68 cm/s LVOT diam:     1.70 cm  LV E/e' lateral: 29.9 LV SV:         50 LV SV Index:   34 LVOT Area:     2.27 cm  RIGHT VENTRICLE RV S prime:     15.20 cm/s TAPSE (M-mode): 2.4 cm LEFT ATRIUM             Index       RIGHT ATRIUM           Index LA diam:        3.90 cm 2.60 cm/m  RA Area:     14.30 cm LA Vol (A2C):   60.7 ml 40.54 ml/m RA Volume:   33.20 ml  22.17 ml/m LA Vol (A4C):   37.4 ml 24.98 ml/m LA Biplane Vol: 49.1 ml 32.79 ml/m  AORTIC VALVE LVOT Vmax:   116.00 cm/s LVOT Vmean:  81.500 cm/s LVOT VTI:    0.222 m  AORTA Ao Root diam: 2.70 cm Ao Asc diam:  2.60 cm MITRAL VALVE                TRICUSPID VALVE MV Area (PHT): 3.42 cm     TR Peak grad:   36.0 mmHg MV Peak grad:  12.1 mmHg    TR Vmax:        300.00 cm/s MV Mean grad:  6.0 mmHg MV Vmax:       1.74 m/s     SHUNTS MV Vmean:      113.0 cm/s   Systemic VTI:  0.22 m MV Decel Time: 222 msec     Systemic Diam: 1.70 cm MV E velocity: 140.00 cm/s MV A velocity: 106.00 cm/s MV E/A ratio:  1.32 Mertie Moores MD Electronically signed by Mertie Moores MD Signature Date/Time: 11/21/2020/12:18:25 PM    Final    VAS [place] LOWER EXTREMITY VENOUS (DVT)  Result Date: 11/22/2020  Lower Venous DVT Study Indications: Elevated ddimer, Swelling, and Edema.  Comparison Study: no prior Performing Technologist: Abram Sander RVS  Examination Guidelines: A complete evaluation includes B-mode imaging,  spectral Doppler, color Doppler, and power Doppler as needed of all accessible portions of each vessel. Bilateral testing is considered an integral part of a complete examination. Limited examinations for reoccurring indications may be performed as noted. The reflux portion of the exam is performed with the patient in reverse Trendelenburg.  +---------+---------------+---------+-----------+----------+--------------+ RIGHT    CompressibilityPhasicitySpontaneityPropertiesThrombus Aging +---------+---------------+---------+-----------+----------+--------------+ CFV      Full           Yes      Yes                                 +---------+---------------+---------+-----------+----------+--------------+ SFJ      Full                                                        +---------+---------------+---------+-----------+----------+--------------+  FV Prox  Full                                                        +---------+---------------+---------+-----------+----------+--------------+ FV Mid   Full                                                        +---------+---------------+---------+-----------+----------+--------------+ FV DistalFull                                                        +---------+---------------+---------+-----------+----------+--------------+ PFV      Full                                                        +---------+---------------+---------+-----------+----------+--------------+ POP      Full           Yes      Yes                                 +---------+---------------+---------+-----------+----------+--------------+ PTV      Full                                                        +---------+---------------+---------+-----------+----------+--------------+ PERO     Full                                                        +---------+---------------+---------+-----------+----------+--------------+    +---------+---------------+---------+-----------+----------+--------------+ LEFT     CompressibilityPhasicitySpontaneityPropertiesThrombus Aging +---------+---------------+---------+-----------+----------+--------------+ CFV      Full           Yes      Yes                                 +---------+---------------+---------+-----------+----------+--------------+ SFJ      Full                                                        +---------+---------------+---------+-----------+----------+--------------+ FV Prox  Full                                                        +---------+---------------+---------+-----------+----------+--------------+  FV Mid   Full                                                        +---------+---------------+---------+-----------+----------+--------------+ FV DistalFull                                                        +---------+---------------+---------+-----------+----------+--------------+ PFV      Full                                                        +---------+---------------+---------+-----------+----------+--------------+ POP      Full           Yes      Yes                                 +---------+---------------+---------+-----------+----------+--------------+ PTV      Full                                                        +---------+---------------+---------+-----------+----------+--------------+ PERO     Full                                                        +---------+---------------+---------+-----------+----------+--------------+     Summary: BILATERAL: - No evidence of deep vein thrombosis seen in the lower extremities, bilaterally. - No evidence of superficial venous thrombosis in the lower extremities, bilaterally. -No evidence of popliteal cyst, bilaterally.   *See table(s) above for measurements and observations. Electronically signed by Harold Barban MD on 11/22/2020 at  5:36:32 PM.    Final    US Abdomen Limited RUQ (LIVER/GB)  Result Date: 11/23/2020 CLINICAL DATA:  Abdominal pain EXAM: ULTRASOUND ABDOMEN LIMITED RIGHT UPPER QUADRANT COMPARISON:  CT 11/20/2020, renal ultrasound 03/31/2018 FINDINGS: Gallbladder: No visible biliary stones or sludge. No gallbladder wall thickening or pericholecystic fluid. Sonographic Percell Miller sign is reportedly negative. Common bile duct: Diameter: 2.7 mm, nondilated Liver: No focal lesion identified. Within normal limits in parenchymal echogenicity. No intrahepatic biliary dilatation. Portal vein is patent on color Doppler imaging with normal direction of blood flow towards the liver. Other: Right pleural effusion. IMPRESSION: Right pleural effusion. Otherwise unremarkable right upper quadrant ultrasound. Electronically Signed   By: Lovena Le M.D.   On: 11/23/2020 20:16     Time coordinating discharge: Over 30 minutes    Dwyane Dee, MD  [facility] 11/25/2020, 6:13 PM

## 2020-11-25 NOTE — Telephone Encounter (Signed)
From the discharge call:  Call completed with patient's daughter, [name].  DPR on file to speak with her.  [name] was at work and not with her mother at the time of this call.      [name] said that her sibling was with her mother. They have an oximeter for her mother and her sibling said that her mother's O2 level was dropping.  It was 89% earlier today.  Her bother had purchased O2 for his mother and they currently have her on the O2. [name] was not sure of her current O2 sat that warranted O2. She said that her mother has not complained of shortness of breath. She was not sure where her brother got the O2 or how many liters/min her mother is using.O2 was not ordered at discharge.  She said she is waiting for a call back from her sibling with a report of her mother's status.  She said that after she receives this information she will decide if she needs to call EMS  For her mother or contact Dr Margarita Rana for advice.    She also would is requesting a refill of the hydrocortisone.  It is still listed in the AVS but her mother has run out. They have all other medications.   They have purchased a scale for her mother to check daily weights.  She did not weigh herself this morning but [name] said that they have the instructions noting when to call the provider about weight gain.   Appointment with  Dr Margarita Rana 12/02/2020

## 2020-11-26 ENCOUNTER — Other Ambulatory Visit: Payer: Self-pay | Admitting: Family Medicine

## 2020-11-26 ENCOUNTER — Ambulatory Visit: Payer: Self-pay

## 2020-11-26 MED ORDER — HYDROCORTISONE 10 MG PO TABS
10.0000 mg | ORAL_TABLET | Freq: Two times a day (BID) | ORAL | 0 refills | Status: DC
Start: 2020-11-26 — End: 2021-02-13

## 2020-11-26 MED FILL — HYDROCORTISONE 10 MG TABLET: 10 | 30 days supply | Qty: 60 | Fill #0

## 2020-11-26 NOTE — Telephone Encounter (Signed)
Patietns daughter called and states that her mothers eyes are swelling today.  She states that the welling is in the upper eyelids and under eyelids to the cheeks.  She is concerned that she may be reacting to some new medications she was just place on after hospitalization for pneumonia and CHF.Patient denies itching or pain. Per daughter her mothers lips are not swollen and she is having no problem swallowing. She has redness that extend from her under eyes to under her nose.  Today VS temp 99.5. O2 sat 95%. Yesterday she states her mother temp was 101.5 Per protocol patient will go to UC today for evaluation. Care advice read to daughter.  She verbalized understanding.  Reason for Disposition . [1] SEVERE eyelid swelling (i.e., shut or almost) AND [2] involves both eyes (Exception: itchy eyes, which  are probably an allergic reaction)  Additional Information . Negative: [1] SEVERE eyelid swelling (i.e., shut or almost) AND [2] Taking an ACE Inhibitor medication (e.g., benazepril/LOTENSIN, captopril/CAPOTEN, enalapril/VASOTEC, lisinopril/ZESTRIL)    Face red around and under nose  Answer Assessment - Initial Assessment Questions 1. ONSET: "When did the swelling start?" (e.g., minutes, hours, days)     Today  2. LOCATION: "What part of the face is swollen?"     Eye area 3. SEVERITY: "How swollen is it?"    Puffy moderate 4. ITCHING: "Is there any itching?" If Yes, ask: "How much?"   (Scale 1-10; mild, moderate or severe)     no 5. PAIN: "Is the swelling painful to touch?" If Yes, ask: "How painful is it?"   (Scale 1-10; mild, moderate or severe)    no 6. FEVER: "Do you have a fever?" If Yes, ask: "What is it, how was it measured, and when did it start?"    Yesterday 99.5 101.5 O2 95 today 7. CAUSE: "What do you think is causing the face swelling?"    CHF was in hospital released 2 days ago 8. RECURRENT SYMPTOM: "Have you had face swelling before?" If Yes, ask: "When was the last time?" "What  happened that time?"    Never 9. OTHER SYMPTOMS: "Do you have any other symptoms?" (e.g., toothache, leg swelling)    No swelling of legs 10. PREGNANCY: "Is there any chance you are pregnant?" "When was your last menstrual period?"       N/A  Answer Assessment - Initial Assessment Questions 1. ONSET: "When did the swelling start?" (e.g., minutes, hours, days)     today 2. LOCATION: "What part of the eyelids is swollen?"     Both eyes and under eye 3. SEVERITY: "How swollen is it?"     moderate 4. ITCHING: "Is there any itching?" If Yes, ask: "How much?"   (Scale 1-10; mild, moderate or severe)    No 5. PAIN: "Is the swelling painful to touch?" If Yes, ask: "How painful is it?"   (Scale 1-10; mild, moderate or severe)   No 6. FEVER: "Do you have a fever?" If Yes, ask: "What is it, how was it measured, and when did it start?"      99.5 7. CAUSE: "What do you think is causing the swelling?"    unsure 8. RECURRENT SYMPTOM: "Have you had eyelid swelling before?" If Yes, ask: "When was the last time?" "What happened that time?"   No 9. OTHER SYMPTOMS: "Do you have any other symptoms?" (e.g., blurred vision, eye discharge, rash, runny nose)     Watery reddish under cheek 10. PREGNANCY: "Is there  any chance you are pregnant?" "When was your last menstrual period?"      N/A  Protocols used: EYE - [facility]

## 2020-11-26 NOTE — Telephone Encounter (Signed)
Call placed to patient's daughter, [name], and informed her that Dr Margarita Rana placed the order for hydrocortisone.   She said that she is not sure she can get to the pharmacy this week and will have her sister pick it up 12/02/2020 when she accompanies her mother to the appointment at [facility].  She said that swelling of her mother's eyelids and cheeks that she reported this morning  has improved slightly.    [name] also explained that she is not sure exactly how her brother got the O2 for her mother.  She also noted that her brother had stated that her O2 sat was < 88 however, [name] has checked on her mother since then and the O2 has not been less than 88 and has ranged into the 90's. Reviewed when to call EMS and explained that her O2 sat can be measured when she is in the office for her appointment next week. [name] did state that her mother is not in any respiratory distress.   This CM also explained that if she qualifies for O2 she does not have insurance and would need to pay out of pocket. [name] said that she understood.,   [name] then inquired about financial assistance.  Explained to her the difference between the [employer] and [employer] assistance and the process for submitting the application.  She said her sister would pick up an application next week at her mother's appointment. She is also interested in applying for medicaid.

## 2020-11-30 ENCOUNTER — Encounter: Payer: Self-pay | Admitting: Family Medicine

## 2020-12-02 ENCOUNTER — Other Ambulatory Visit: Payer: Self-pay | Admitting: Family Medicine

## 2020-12-02 ENCOUNTER — Ambulatory Visit: Payer: Self-pay | Attending: Family Medicine | Admitting: Family Medicine

## 2020-12-02 ENCOUNTER — Other Ambulatory Visit: Payer: Self-pay

## 2020-12-02 VITALS — BP 140/44 | HR 53 | Ht 62.0 in | Wt 112.4 lb

## 2020-12-02 DIAGNOSIS — E038 Other specified hypothyroidism: Secondary | ICD-10-CM

## 2020-12-02 DIAGNOSIS — E2740 Unspecified adrenocortical insufficiency: Secondary | ICD-10-CM

## 2020-12-02 DIAGNOSIS — K219 Gastro-esophageal reflux disease without esophagitis: Secondary | ICD-10-CM

## 2020-12-02 DIAGNOSIS — E1122 Type 2 diabetes mellitus with diabetic chronic kidney disease: Secondary | ICD-10-CM

## 2020-12-02 DIAGNOSIS — I5032 Chronic diastolic (congestive) heart failure: Secondary | ICD-10-CM

## 2020-12-02 MED ORDER — ATORVASTATIN CALCIUM 20 MG PO TABS: 20.0000 mg | ORAL_TABLET | Freq: Every day | ORAL | 3 refills | Status: DC

## 2020-12-02 MED ORDER — OMEPRAZOLE 40 MG PO CPDR: 40.0000 mg | DELAYED_RELEASE_CAPSULE | Freq: Every day | ORAL | 3 refills | Status: DC

## 2020-12-02 MED FILL — ATORVASTATIN CALCIUM 20 MG: 20 | 30 days supply | Qty: 30 | Fill #0

## 2020-12-02 MED FILL — OMEPRAZOLE DR 40 MG CAPSULE: 40 | 30 days supply | Qty: 30 | Fill #0

## 2020-12-02 NOTE — Progress Notes (Signed)
Subjective:  Patient ID: [name], female    DOB: December 07, 1961  Age: 59 y.o. MRN: [id number]  CC: Hospitalization Follow-up   HPI Aries Parisi is a 59 year old female with a history of chronic adrenal insufficiency, hypertension, type 2 diabetes mellitus (A1c 6.6), stage III chronic kidney disease, diastolic CHF (newly diagnosed with EF of 65 to 70%) here for follow-up at the transitional care clinic. She was recently hospitalized at [facility] from 11/20/2020 through 11/24/2020 for enteritis.  During hospitalization was also diagnosed with community acquired pneumonia which was treated with antibiotics. Also diagnosed with a new diagnosis of diastolic CHF and commenced on cardiac medications for optimization of cardiac function. Echo 11/21/2020: IMPRESSIONS  1. Left ventricular ejection fraction, by estimation, is 65 to 70%. The  left ventricle has normal function. The left ventricle has no regional  wall motion abnormalities. Left ventricular diastolic parameters are  consistent with Grade II diastolic  dysfunction (pseudonormalization).  2. Right ventricular systolic function is normal. The right ventricular  size is normal. There is mildly elevated pulmonary artery systolic  pressure.  3. The mitral valve is grossly normal. Mild mitral valve regurgitation.  No evidence of mitral stenosis.  4. The aortic valve is normal in structure. Aortic valve regurgitation is  not visualized. No aortic stenosis is present.  5. The inferior vena cava is normal in size with greater than 50%  respiratory variability, suggesting right atrial pressure of 3 mmHg.   At discharge cardiology follow-up was recommended outpatient.  After her discharge her daughter had been sending 'my chart' messages about the patient having low oxygen saturations and they obtained oxygen from an outside source (of note this was not prescribed at discharge; her discharge oxygen saturation of 91%) Today  her oxygen saturation at rest is 98%. She states she feels good but her daughter complains she has had reflux symptoms especially with eating spicy foods. For her adrenal insufficiency had previously referred her to endocrine and her chart reveals multiple attempts of calling the patient for an appointment failed.  I had informed daughter of this. Daughter is in the process of calling Endocrine and Nephrology for an appointment.  She will also be calling to schedule an appointment with cardiac rehab.  Past Medical History:  Diagnosis Date  . Diabetes mellitus without complication ([place])   . Hypertension   . Thyroid disease     Past Surgical History:  Procedure Laterality Date  . CESAREAN SECTION      Family History  Problem Relation Age of Onset  . Diabetes Mother   . Hyperlipidemia Father   . Hypertension Father     Allergies  Allergen Reactions  . Percocet [Oxycodone-Acetaminophen] Rash    Outpatient Medications Prior to Visit  Medication Sig Dispense Refill  . acetaminophen (TYLENOL) 325 MG tablet Take 2 tablets (650 mg total) by mouth every 6 (six) hours as needed for mild pain.    Marland Kitchen amLODipine (NORVASC) 10 MG tablet Take 1 tablet (10 mg total) by mouth daily. 30 tablet 3  . aspirin 81 MG chewable tablet Chew 81 mg by mouth daily.    . Calcium Carbonate-Vit D-Min (CALCIUM 1200 PO) Take 1,200 mg by mouth daily.    . fluticasone (FLONASE) 50 MCG/ACT nasal spray Place 2 sprays into both nostrils daily. 16 g 1  . hydrALAZINE (APRESOLINE) 100 MG tablet Take 1 tablet (100 mg total) by mouth 3 (three) times daily. 90 tablet 3  . hydrocortisone (CORTEF) 10 MG tablet Take  1 tablet (10 mg total) by mouth 2 (two) times daily. 60 tablet 0  . ibuprofen (ADVIL) 600 MG tablet Take 1 tablet (600 mg total) by mouth every 6 (six) hours as needed. (Patient taking differently: Take 600 mg by mouth every 6 (six) hours as needed for moderate pain.) 30 tablet 0  . isosorbide mononitrate (IMDUR) 30  MG 24 hr tablet Take 1 tablet (30 mg total) by mouth daily. 30 tablet 3  . levothyroxine (SYNTHROID) 150 MCG tablet Take 1 tablet (150 mcg total) by mouth daily before breakfast. 30 tablet 3  . metoprolol succinate (TOPROL-XL) 25 MG 24 hr tablet Take 0.5 tablets (12.5 mg total) by mouth daily. 30 tablet 3  . Multiple Vitamin (MULTIVITAMIN WITH MINERALS) TABS tablet Take 1 tablet by mouth daily.    . polyethylene glycol (MIRALAX / GLYCOLAX) 17 g packet Take 17 g by mouth daily as needed.     No facility-administered medications prior to visit.     ROS Review of Systems  Constitutional: Negative for activity change, appetite change and fatigue.  HENT: Negative for congestion, sinus pressure and sore throat.   Eyes: Negative for visual disturbance.  Respiratory: Negative for cough, chest tightness, shortness of breath and wheezing.   Cardiovascular: Negative for chest pain and palpitations.  Gastrointestinal: Negative for abdominal distention, abdominal pain and constipation.  Endocrine: Negative for polydipsia.  Genitourinary: Negative for dysuria and frequency.  Musculoskeletal: Negative for arthralgias and back pain.  Skin: Negative for rash.  Neurological: Negative for tremors, light-headedness and numbness.  Hematological: Does not bruise/bleed easily.  Psychiatric/Behavioral: Negative for agitation and behavioral problems.    Objective:  BP (!) 140/44   Pulse (!) 53   Ht 5\' 2"  (1.575 m)   Wt 112 lb 6.4 oz (51 kg)   SpO2 98%   BMI 20.56 kg/m   BP/Weight 12/02/2020 11/24/2020 10/24/7261  Systolic BP 035 597 -  Diastolic BP 44 52 -  Wt. (Lbs) 112.4 111.77 -  BMI 20.56 - 20.44      Physical Exam Constitutional:      Appearance: She is well-developed.  Neck:     Vascular: No JVD.  Cardiovascular:     Rate and Rhythm: Bradycardia present.     Heart sounds: Normal heart sounds. No murmur heard.   Pulmonary:     Effort: Pulmonary effort is normal.     Breath sounds:  Normal breath sounds. No wheezing or rales.  Chest:     Chest wall: No tenderness.  Abdominal:     General: Bowel sounds are normal. There is no distension.     Palpations: Abdomen is soft. There is no mass.     Tenderness: There is no abdominal tenderness.  Musculoskeletal:        General: Normal range of motion.     Right lower leg: No edema.     Left lower leg: No edema.  Neurological:     Mental Status: She is alert and oriented to person, place, and time.  Psychiatric:        Mood and Affect: Mood normal.     CMP Latest Ref Rng & Units 11/24/2020 11/23/2020 11/22/2020  Glucose 70 - 99 mg/dL 149(H) 191(H) 215(H)  BUN 8 - 23 mg/dL 48(H) 48(H) 51(H)  Creatinine 0.44 - 1.00 mg/dL 2.49(H) 2.51(H) 2.89(H)  Sodium 135 - 145 mmol/L 133(L) 132(L) 130(L)  Potassium 3.5 - 5.1 mmol/L 3.7 3.7 3.5  Chloride 98 - 111 mmol/L 100 99 98  CO2 22 - 32 mmol/L 21(L) 21(L) 21(L)  Calcium 8.9 - 10.3 mg/dL 8.5(L) 8.3(L) 8.3(L)  Total Protein 6.5 - 8.1 g/dL - - 7.1  Total Bilirubin 0.3 - 1.2 mg/dL - - 0.8  Alkaline Phos 38 - 126 U/L - - 112  AST 15 - 41 U/L - - 30  ALT 0 - 44 U/L - - 32    Lipid Panel     Component Value Date/Time   CHOL 218 (H) 06/13/2011 0810   TRIG 85 06/13/2011 0810   HDL 65 06/13/2011 0810   CHOLHDL 3.4 06/13/2011 0810   VLDL 17 06/13/2011 0810   LDLCALC 136 (H) 06/13/2011 0810    CBC    Component Value Date/Time   WBC 10.7 (H) 11/23/2020 0629   RBC 2.27 (L) 11/23/2020 0629   HGB 8.6 (L) 11/23/2020 1603   HCT 25.9 (L) 11/23/2020 1603   PLT 152 11/23/2020 0629   MCV 93.4 11/23/2020 0629   MCH 30.0 11/23/2020 0629   MCHC 32.1 11/23/2020 0629   RDW 13.0 11/23/2020 0629   LYMPHSABS 1.5 11/23/2020 0629   MONOABS 0.6 11/23/2020 0629   EOSABS 0.2 11/23/2020 0629   BASOSABS 0.1 11/23/2020 0629    Lab Results  Component Value Date   HGBA1C 6.6 10/29/2020    Assessment & Plan:  1. Chronic diastolic congestive heart failure (HCC) EF of 65 to  70% Euvolemic She is bradycardic likely due to beta-blocker use Atorvastatin initiated for cardiovascular prevention Referred to cardiology for optimization of management Oxygen saturation is normal and she has been advised she does not need oxygen We will benefit from cardiac rehab but has no medical coverage.  She has been advised to obtain the financial assistance paperwork at the front office to facilitate referral Case manager called in to see the patient to coordinate care and referral to multiple specialties - Ambulatory referral to Cardiology - atorvastatin (LIPITOR) 20 MG tablet; Take 1 tablet (20 mg total) by mouth daily.  Dispense: 30 tablet; Refill: 3  2. 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 Adrenal insufficiency ([place]) Currently on Cortef Advise a subsequent refills to be coming from her endocrinologist  4. Other specified hypothyroidism Controlled on levothyroxine  5. Gastroesophageal reflux disease without esophagitis Counseled against ingesting foods that trigger symptoms Avoid recumbency up to 2 hours post meals, avoid late meals We will initiate PPI - omeprazole (PRILOSEC) 40 MG capsule; Take 1 capsule (40 mg total) by mouth daily.  Dispense: 30 capsule; Refill: 3    Meds ordered this encounter  Medications  . omeprazole (PRILOSEC) 40 MG capsule    Sig: Take 1 capsule (40 mg total) by mouth daily.    Dispense:  30 capsule    Refill:  3  . atorvastatin (LIPITOR) 20 MG tablet    Sig: Take 1 tablet (20 mg total) by mouth daily.    Dispense:  30 tablet    Refill:  3    Follow-up: Return in about 3 months (around 03/02/2021) for Chronic disease management.       Charlott Rakes,  MD, FAAFP. [facility], [place]   12/02/2020, 12:33 PM

## 2020-12-02 NOTE — Progress Notes (Signed)
HFU-11/20/20

## 2020-12-02 NOTE — Patient Instructions (Signed)

## 2020-12-06 ENCOUNTER — Encounter: Payer: Self-pay | Admitting: Family Medicine

## 2020-12-09 ENCOUNTER — Ambulatory Visit: Payer: Self-pay

## 2020-12-17 ENCOUNTER — Ambulatory Visit: Payer: Self-pay

## 2020-12-17 MED FILL — LEVOTHYROXINE SODIUM 150 MC: 150 | 30 days supply | Qty: 30 | Fill #1

## 2020-12-17 MED FILL — AMLODIPINE BESYLATE 10 MG T: 10 | 30 days supply | Qty: 30 | Fill #1

## 2020-12-23 ENCOUNTER — Ambulatory Visit: Payer: Self-pay | Admitting: Family Medicine

## 2020-12-26 ENCOUNTER — Encounter: Payer: Self-pay | Admitting: Cardiology

## 2020-12-26 ENCOUNTER — Other Ambulatory Visit: Payer: Self-pay

## 2020-12-26 ENCOUNTER — Ambulatory Visit (INDEPENDENT_AMBULATORY_CARE_PROVIDER_SITE_OTHER): Payer: Self-pay | Admitting: Cardiology

## 2020-12-26 VITALS — BP 190/46 | HR 57 | Ht 64.0 in | Wt 111.8 lb

## 2020-12-26 DIAGNOSIS — R011 Cardiac murmur, unspecified: Secondary | ICD-10-CM

## 2020-12-26 DIAGNOSIS — E119 Type 2 diabetes mellitus without complications: Secondary | ICD-10-CM

## 2020-12-26 DIAGNOSIS — N184 Chronic kidney disease, stage 4 (severe): Secondary | ICD-10-CM

## 2020-12-26 DIAGNOSIS — I10 Essential (primary) hypertension: Secondary | ICD-10-CM

## 2020-12-26 DIAGNOSIS — I5032 Chronic diastolic (congestive) heart failure: Secondary | ICD-10-CM

## 2020-12-26 DIAGNOSIS — Z7189 Other specified counseling: Secondary | ICD-10-CM

## 2020-12-26 NOTE — Progress Notes (Signed)
Cardiology Office Note:    Date:  12/26/2020   ID:  [name], DOB [birth year], MRN [id number]  PCP:  Charlott Rakes, MD  Cardiologist:  Buford Dresser, MD  Referring MD: Charlott Rakes, MD   CC: new patient evaluation for diastolic heart failure.  History of Present Illness:    [name] is a 60 y.o. female with a hx of hypertension, type II diabetes, chronic kidney disease who is seen as a new consult at the request of Charlott Rakes, MD for the evaluation and management of diastolic heart failure.  Note from Dr. Margarita Rana reviewed from 12/02/20. Also reviewed recent admission 11/2020, was admitted for enteritis and pneumonia but noted to have new diastolic heart failure that admission.  Here with daughter. Not told anything about her heart during her admission. Still has wheezing and shortness of breath with exertion. We did extensive education about what diastolic dysfunction is, how it is managed, heart failure education.  Had Covid in early October, then enteritis/pneumonia 11/2020. Working on increasing activity back to her prior baseline  Vegetarian, no salt or sugar in diet. We discussed diet and exercise recommendations today.  Mild swelling since starting amlodipine. Overall swelling is better than when she was in the ER. Sees [facility] 01/09/21 and endocrinology in 01/2021. Blood pressure is elevated today. She is taking medications as prescribed.  Denies chest pain, shortness of breath at rest. No PND, orthopnea, worsening LE edema or unexpected weight gain. No syncope or palpitations.  Past Medical History:  Diagnosis Date  . Diabetes mellitus without complication ([place])   . Hypertension   . Thyroid disease     Past Surgical History:  Procedure Laterality Date  . CESAREAN SECTION      Current Medications: Current Outpatient Medications on File Prior to Visit  Medication Sig  . acetaminophen (TYLENOL) 325 MG tablet Take 2 tablets (650 mg  total) by mouth every 6 (six) hours as needed for mild pain.  Marland Kitchen amLODipine (NORVASC) 10 MG tablet Take 1 tablet (10 mg total) by mouth daily.  Marland Kitchen aspirin 81 MG chewable tablet Chew 81 mg by mouth daily.  Marland Kitchen atorvastatin (LIPITOR) 20 MG tablet Take 1 tablet (20 mg total) by mouth daily.  . Calcium Carbonate-Vit D-Min (CALCIUM 1200 PO) Take 1,200 mg by mouth daily.  . fluticasone (FLONASE) 50 MCG/ACT nasal spray Place 2 sprays into both nostrils daily.  . hydrALAZINE (APRESOLINE) 100 MG tablet Take 1 tablet (100 mg total) by mouth 3 (three) times daily.  . hydrocortisone (CORTEF) 10 MG tablet Take 1 tablet (10 mg total) by mouth 2 (two) times daily.  . isosorbide mononitrate (IMDUR) 30 MG 24 hr tablet Take 1 tablet (30 mg total) by mouth daily.  Marland Kitchen levothyroxine (SYNTHROID) 150 MCG tablet Take 1 tablet (150 mcg total) by mouth daily before breakfast.  . metoprolol succinate (TOPROL-XL) 25 MG 24 hr tablet Take 0.5 tablets (12.5 mg total) by mouth daily.  . Multiple Vitamin (MULTIVITAMIN WITH MINERALS) TABS tablet Take 1 tablet by mouth daily.  Marland Kitchen omeprazole (PRILOSEC) 40 MG capsule Take 1 capsule (40 mg total) by mouth daily.  . polyethylene glycol (MIRALAX / GLYCOLAX) 17 g packet Take 17 g by mouth daily as needed.   No current facility-administered medications on file prior to visit.     Allergies:   Percocet [oxycodone-acetaminophen]   Social History   Tobacco Use  . Smoking status: Never Smoker  . Smokeless tobacco: Never Used  Substance Use Topics  . Alcohol  use: No  . Drug use: No    Family History: family history includes Diabetes in her mother; Hyperlipidemia in her father; Hypertension in her father.  ROS:   Please see the history of present illness.  Additional pertinent ROS: Constitutional: Negative for chills, fever, night sweats, unintentional weight loss  HENT: Negative for ear pain and hearing loss.   Eyes: Negative for loss of vision and eye pain.  Respiratory: Negative  for cough, sputum, wheezing.   Cardiovascular: See HPI. Gastrointestinal: Negative for abdominal pain, melena, and hematochezia.  Genitourinary: Negative for dysuria and hematuria.  Musculoskeletal: Negative for falls and myalgias.  Skin: Negative for itching and rash.  Neurological: Negative for focal weakness, focal sensory changes and loss of consciousness.  Endo/Heme/Allergies: Does not bruise/bleed easily.     EKGs/Labs/Other Studies Reviewed:    The following studies were reviewed today: Echo 11/21/20 1. Left ventricular ejection fraction, by estimation, is 65 to 70%. The  left ventricle has normal function. The left ventricle has no regional  wall motion abnormalities. Left ventricular diastolic parameters are  consistent with Grade II diastolic  dysfunction (pseudonormalization).  2. Right ventricular systolic function is normal. The right ventricular  size is normal. There is mildly elevated pulmonary artery systolic  pressure.  3. The mitral valve is grossly normal. Mild mitral valve regurgitation.  No evidence of mitral stenosis.  4. The aortic valve is normal in structure. Aortic valve regurgitation is  not visualized. No aortic stenosis is present.  5. The inferior vena cava is normal in size with greater than 50%  respiratory variability, suggesting right atrial pressure of 3 mmHg.   EKG:  EKG is personally reviewed.  The ekg ordered today demonstrates sinus bradycardia at 57 bpm  Recent Labs: 10/29/2020: TSH 17.700 11/20/2020: B Natriuretic Peptide 704.5 11/22/2020: ALT 32; Magnesium 2.2 11/23/2020: Hemoglobin 8.6; Platelets 152 11/24/2020: BUN 48; Creatinine, Ser 2.49; Potassium 3.7; Sodium 133  Recent Lipid Panel    Component Value Date/Time   CHOL 218 (H) 06/13/2011 0810   TRIG 85 06/13/2011 0810   HDL 65 06/13/2011 0810   CHOLHDL 3.4 06/13/2011 0810   VLDL 17 06/13/2011 0810   LDLCALC 136 (H) 06/13/2011 0810    Physical Exam:    VS:  BP (!) 190/46    Pulse (!) 57   Ht 5\' 4"  (1.626 m)   Wt 111 lb 12.8 oz (50.7 kg)   SpO2 96%   BMI 19.19 kg/m     Wt Readings from Last 3 Encounters:  12/26/20 111 lb 12.8 oz (50.7 kg)  12/02/20 112 lb 6.4 oz (51 kg)  11/24/20 111 lb 12.4 oz (50.7 kg)    GEN: Well nourished, well developed in no acute distress HEENT: Normal, moist mucous membranes NECK: No JVD CARDIAC: regular rhythm, normal S1 and S2, no rubs or gallops. 1/6 systolic murmur. VASCULAR: Radial and DP pulses 2+ bilaterally. No carotid bruits. +renal bruit RESPIRATORY:  Clear to auscultation without rales, wheezing or rhonchi  ABDOMEN: Soft, non-tender, non-distended MUSCULOSKELETAL:  Ambulates independently SKIN: Warm and dry, trivial bilateral LE edema NEUROLOGIC:  Alert and oriented x 3. No focal neuro deficits noted. PSYCHIATRIC:  Normal affect    ASSESSMENT:    1. Chronic diastolic heart failure ([place])   2. Encounter for education about heart failure   3. Essential hypertension   4. Murmur, cardiac   5. Cardiac risk counseling   6. Counseling on health promotion and disease prevention   7. CKD (chronic kidney disease)  stage 4, GFR 15-29 ml/min (HCC)   8. Type 2 diabetes mellitus without obesity (HCC)    PLAN:    Chronic diastolic heart failure Heart failure education Do the following things EVERY DAY:  1) Weigh yourself EVERY morning after you go to the bathroom but before you eat or drink anything. Write this number down in a weight log/diary. If you gain 3 pounds overnight or 5 pounds in a week, call the office.  2) Take your medicines as prescribed. If you have concerns about your medications, please call us before you stop taking them.   3) Eat low salt foods--Limit salt (sodium) to 2000 mg per day. This will help prevent your body from holding onto fluid. Read food labels as many processed foods have a lot of sodium, especially canned goods and prepackaged meats. If you would like some assistance choosing low  sodium foods, we would be happy to set you up with a nutritionist.  4) Stay as active as you can everyday. Staying active will give you more energy and make your muscles stronger. Start with 5 minutes at a time and work your way up to 30 minutes a day. Break up your activities--do some in the morning and some in the afternoon. Start with 3 days per week and work your way up to 5 days as you can.  If you have chest pain, feel short of breath, dizzy, or lightheaded, STOP. If you don't feel better after a short rest, call 911. If you do feel better, call the office to let us know you have symptoms with exercise.  5) Limit all fluids for the day to less than 2 liters. Fluid includes all drinks, coffee, juice, ice chips, soup, jello, and all other liquids.  -we discussed that fluid and blood pressure management is the mainstay of treatment.  -renal function precludes use of entresto or SGLT2i -avoid bradycardia given diastolic dysfunction  Hypertension Chronic kidney disease stage 4 -very elevated BP in the office today, asymptomatic -taking amlodipine 10 mg daily, hydralazine 100 mg TID, isosorbide 30 mg daily, metoprolol succinate 12.5 mg daily -recent BP at PCP 140/44 -may need to use MAP goal given low diastolic numbers -last GFR 21, consistent with chronic kidney disease stage 4 -given CKD, will not start ACEi/ARB/ARNI/MRA/diuretic.  -I did hear an abdominal bruit. Would recommend vascular duplex to evaluate for renal artery stenosis. As they plan to see nephrologist soon, will defer to them, but I am also happy to order -continue to monitor BP, contact me if remains elevated -would use diuretic only PRN for fluid from a cardiac perspective, but defer to nephrology -with low diastolic number, will avoid increasing imdur.   Type II diabetes -last A1c 6.6 -not currently on diabetes medication -would typically aim for SGLT2i, but given renal disease this is not an option  Murmur: -reviewed  echo, not high risk  Cardiac risk counseling and prevention recommendations: -recommend heart healthy/Mediterranean diet, with whole grains, fruits, vegetable, fish, lean meats, nuts, and olive oil. Limit salt. -recommend moderate walking, 3-5 times/week for 30-50 minutes each session. Aim for at least 150 minutes.week. Goal should be pace of 3 miles/hours, or walking 1.5 miles in 30 minutes -recommend avoidance of tobacco products. Avoid excess alcohol. -ASCVD risk score: The ASCVD Risk score Mikey Bussing DC Jr., et al., 2013) failed to calculate for the following reasons:   Cannot find a previous HDL lab   Cannot find a previous total cholesterol lab    Plan for  follow up: 6 mos or sooner as needed  Buford Dresser, MD, PhD, [facility]    Medication Adjustments/Labs and Tests Ordered: Current medicines are reviewed at length with the patient today.  Concerns regarding medicines are outlined above.  Orders Placed This Encounter  Procedures  . EKG 12-Lead   No orders of the defined types were placed in this encounter.   Patient Instructions  Medication Instructions:  Your Physician recommend you continue on your current medication as directed.    *If you need a refill on your cardiac medications before your next appointment, please call your pharmacy*   Lab Work: None   Testing/Procedures: None   Follow-Up: At [facility], you and your health needs are our priority.  As part of our continuing mission to provide you with exceptional heart care, we have created designated Provider Care Teams.  These Care Teams include your primary Cardiologist (physician) and Advanced Practice Providers (APPs -  Physician Assistants and Nurse Practitioners) who all work together to provide you with the care you need, when you need it.  We recommend signing up for the patient portal called "MyChart".  Sign up information is provided on this After Visit Summary.   MyChart is used to connect with patients for Virtual Visits (Telemedicine).  Patients are able to view lab/test results, encounter notes, upcoming appointments, etc.  Non-urgent messages can be sent to your provider as well.   To learn more about what you can do with MyChart, go to [email or web address].    Your next appointment:   6 month(s)  The format for your next appointment:   In Person  Provider:   Buford Dresser, MD    Do the following things EVERY DAY:  6) Weigh yourself EVERY morning after you go to the bathroom but before you eat or drink anything. Write this number down in a weight log/diary. If you gain 3 pounds overnight or 5 pounds in a week, call the office.  7) Take your medicines as prescribed. If you have concerns about your medications, please call us before you stop taking them.   8) Eat low salt foods--Limit salt (sodium) to 2000 mg per day. This will help prevent your body from holding onto fluid. Read food labels as many processed foods have a lot of sodium, especially canned goods and prepackaged meats. If you would like some assistance choosing low sodium foods, we would be happy to set you up with a nutritionist.  9) Stay as active as you can everyday. Staying active will give you more energy and make your muscles stronger. Start with 5 minutes at a time and work your way up to 30 minutes a day. Break up your activities--do some in the morning and some in the afternoon. Start with 3 days per week and work your way up to 5 days as you can.  If you have chest pain, feel short of breath, dizzy, or lightheaded, STOP. If you don't feel better after a short rest, call 911. If you do feel better, call the office to let us know you have symptoms with exercise.  10) Limit all fluids for the day to less than 2 liters. Fluid includes all drinks, coffee, juice, ice chips, soup, jello, and all other liquids.  I heard what is called a bruit in your abdomen today. You  had an ultrasounds of your kidneys in 2019, but I did not see an ultrasound that shows blood flow to the kidneys. I  would recommend this be done to make sure that there is not narrowed vessels feeding the kidneys. I will defer to your kidney doctor on this, but I am also happy to order that ultrasound if needed.  Aim to gradually increase to 150 min/week of walking.   Signed, Buford Dresser, MD PhD 12/26/2020 11:28 AM    [place]

## 2020-12-26 NOTE — Patient Instructions (Addendum)
Medication Instructions:  Your Physician recommend you continue on your current medication as directed.    *If you need a refill on your cardiac medications before your next appointment, please call your pharmacy*   Lab Work: None   Testing/Procedures: None   Follow-Up: At [facility], you and your health needs are our priority.  As part of our continuing mission to provide you with exceptional heart care, we have created designated Provider Care Teams.  These Care Teams include your primary Cardiologist (physician) and Advanced Practice Providers (APPs -  Physician Assistants and Nurse Practitioners) who all work together to provide you with the care you need, when you need it.  We recommend signing up for the patient portal called "MyChart".  Sign up information is provided on this After Visit Summary.  MyChart is used to connect with patients for Virtual Visits (Telemedicine).  Patients are able to view lab/test results, encounter notes, upcoming appointments, etc.  Non-urgent messages can be sent to your provider as well.   To learn more about what you can do with MyChart, go to [email or web address].    Your next appointment:   6 month(s)  The format for your next appointment:   In Person  Provider:   Buford Dresser, MD    Do the following things EVERY DAY:  1) Weigh yourself EVERY morning after you go to the bathroom but before you eat or drink anything. Write this number down in a weight log/diary. If you gain 3 pounds overnight or 5 pounds in a week, call the office.  2) Take your medicines as prescribed. If you have concerns about your medications, please call us before you stop taking them.   3) Eat low salt foods-Limit salt (sodium) to 2000 mg per day. This will help prevent your body from holding onto fluid. Read food labels as many processed foods have a lot of sodium, especially canned goods and prepackaged meats. If you would like some assistance  choosing low sodium foods, we would be happy to set you up with a nutritionist.  4) Stay as active as you can everyday. Staying active will give you more energy and make your muscles stronger. Start with 5 minutes at a time and work your way up to 30 minutes a day. Break up your activities--do some in the morning and some in the afternoon. Start with 3 days per week and work your way up to 5 days as you can.  If you have chest pain, feel short of breath, dizzy, or lightheaded, STOP. If you don't feel better after a short rest, call 911. If you do feel better, call the office to let us know you have symptoms with exercise.  5) Limit all fluids for the day to less than 2 liters. Fluid includes all drinks, coffee, juice, ice chips, soup, jello, and all other liquids.  I heard what is called a bruit in your abdomen today. You had an ultrasounds of your kidneys in 2019, but I did not see an ultrasound that shows blood flow to the kidneys. I would recommend this be done to make sure that there is not narrowed vessels feeding the kidneys. I will defer to your kidney doctor on this, but I am also happy to order that ultrasound if needed.  Aim to gradually increase to 150 min/week of walking.

## 2021-01-14 MED FILL — OMEPRAZOLE DR 40 MG CAPSULE: 40 | 30 days supply | Qty: 30 | Fill #1

## 2021-01-14 MED FILL — AMLODIPINE BESYLATE 10 MG T: 10 | 30 days supply | Qty: 30 | Fill #2

## 2021-01-14 MED FILL — ATORVASTATIN CALCIUM 20 MG: 20 | 30 days supply | Qty: 30 | Fill #1

## 2021-01-27 ENCOUNTER — Ambulatory Visit: Payer: Self-pay | Admitting: Endocrinology

## 2021-02-03 ENCOUNTER — Other Ambulatory Visit: Payer: Self-pay | Admitting: Internal Medicine

## 2021-02-03 MED FILL — CHLORTHALIDONE 25 MG TAB: 25 | 30 days supply | Qty: 15 | Fill #0

## 2021-02-03 MED FILL — PRAVASTATIN SODIUM 20 MG TA: 20 | 30 days supply | Qty: 30 | Fill #0

## 2021-02-09 ENCOUNTER — Other Ambulatory Visit: Payer: Self-pay | Admitting: Family Medicine

## 2021-02-09 MED FILL — LEVOTHYROXINE SODIUM 150 MC: 150 | 30 days supply | Qty: 30 | Fill #2

## 2021-02-09 NOTE — Telephone Encounter (Signed)
Requested medication (s) are due for refill today:   Provider to review for refill  Requested medication (s) are on the active medication list:   Yes  Future visit scheduled:   Yes with Dr. Wynetta Emery   Last ordered: 11/26/2020 #60, 0 refills  Clinic note:  Returned because there isn't a protocol for this medication   Requested Prescriptions  Pending Prescriptions Disp Refills   hydrocortisone (CORTEF) 10 MG tablet [Pharmacy Med Name: HYDROCORTISONE 10 MG TABLET 10 Tablet] 60 tablet 0    Sig: Take 1 tablet (10 mg total) by mouth 2 (two) times daily.      Not Delegated - Endocrinology:  Oral Corticosteroids Failed - 02/09/2021  2:14 PM      Failed - This refill cannot be delegated      Failed - Last BP in normal range    BP Readings from Last 1 Encounters:  12/26/20 (!) 190/46          Passed - Valid encounter within last 6 months    Recent Outpatient Visits           2 months ago Chronic diastolic congestive heart failure ([place])   [place], Charlane Ferretti, MD   3 months ago Hypoglycemia associated with diabetes [facility])   [place], MD       Future Appointments             In 1 month Wynetta Emery, Dalbert Batman, MD [place]

## 2021-02-11 ENCOUNTER — Other Ambulatory Visit: Payer: Self-pay | Admitting: Internal Medicine

## 2021-02-11 DIAGNOSIS — I129 Hypertensive chronic kidney disease with stage 1 through stage 4 chronic kidney disease, or unspecified chronic kidney disease: Secondary | ICD-10-CM

## 2021-02-11 DIAGNOSIS — N184 Chronic kidney disease, stage 4 (severe): Secondary | ICD-10-CM

## 2021-02-13 ENCOUNTER — Ambulatory Visit (INDEPENDENT_AMBULATORY_CARE_PROVIDER_SITE_OTHER): Payer: Self-pay | Admitting: Endocrinology

## 2021-02-13 ENCOUNTER — Encounter: Payer: Self-pay | Admitting: Endocrinology

## 2021-02-13 ENCOUNTER — Other Ambulatory Visit: Payer: Self-pay

## 2021-02-13 ENCOUNTER — Other Ambulatory Visit: Payer: Self-pay | Admitting: Endocrinology

## 2021-02-13 VITALS — BP 160/40 | HR 64 | Ht 65.0 in | Wt 121.0 lb

## 2021-02-13 DIAGNOSIS — E2740 Unspecified adrenocortical insufficiency: Secondary | ICD-10-CM

## 2021-02-13 MED ORDER — COSYNTROPIN 0.25 MG IJ SOLR
0.2500 mg | Freq: Once | INTRAMUSCULAR | Status: AC
  Administered 2021-02-13: 0.25 mg via INTRAVENOUS

## 2021-02-13 MED ORDER — HYDROCORTISONE 10 MG PO TABS
10.0000 mg | ORAL_TABLET | Freq: Two times a day (BID) | ORAL | 0 refills | Status: DC
Start: 2021-02-13 — End: 2021-02-15

## 2021-02-13 NOTE — Progress Notes (Signed)
Subjective:    Patient ID: [name], female    DOB: June 19, 1961, 60 y.o.   MRN: [id number]  HPI Pt is ref by Dr Margarita Rana, for poss adrenal insuff.  She has been on cortef since 2012.  She has been on her current cortef 10-BID, for at least several years. Main symptom is fatigue.  Pt says she never misses the levothyroxine, and that the dosage has not recently been changed.   Past Medical History:  Diagnosis Date  . Diabetes mellitus without complication ([place])   . Hypertension   . Thyroid disease     Past Surgical History:  Procedure Laterality Date  . CESAREAN SECTION      Social History   Socioeconomic History  . Marital status: Single    Spouse name: Not on file  . Number of children: Not on file  . Years of education: Not on file  . Highest education level: Not on file  Occupational History  . Not on file  Tobacco Use  . Smoking status: Never Smoker  . Smokeless tobacco: Never Used  Substance and Sexual Activity  . Alcohol use: No  . Drug use: No  . Sexual activity: Not on file  Other Topics Concern  . Not on file  Social History Narrative  . Not on file   Social Determinants of Health   Financial Resource Strain: Not on file  Food Insecurity: Not on file  Transportation Needs: Not on file  Physical Activity: Not on file  Stress: Not on file  Social Connections: Not on file  Intimate Partner Violence: Not on file    Current Outpatient Medications on File Prior to Visit  Medication Sig Dispense Refill  . acetaminophen (TYLENOL) 325 MG tablet Take 2 tablets (650 mg total) by mouth every 6 (six) hours as needed for mild pain.    Marland Kitchen amLODipine (NORVASC) 10 MG tablet Take 1 tablet (10 mg total) by mouth daily. 30 tablet 3  . aspirin 81 MG chewable tablet Chew 81 mg by mouth daily.    Marland Kitchen atorvastatin (LIPITOR) 20 MG tablet Take 1 tablet (20 mg total) by mouth daily. 30 tablet 3  . Calcium Carbonate-Vit D-Min (CALCIUM 1200 PO) Take 1,200 mg by mouth daily.     . fluticasone (FLONASE) 50 MCG/ACT nasal spray Place 2 sprays into both nostrils daily. 16 g 1  . hydrALAZINE (APRESOLINE) 100 MG tablet Take 1 tablet (100 mg total) by mouth 3 (three) times daily. 90 tablet 3  . isosorbide mononitrate (IMDUR) 30 MG 24 hr tablet Take 1 tablet (30 mg total) by mouth daily. 30 tablet 3  . levothyroxine (SYNTHROID) 150 MCG tablet Take 1 tablet (150 mcg total) by mouth daily before breakfast. 30 tablet 3  . metoprolol succinate (TOPROL-XL) 25 MG 24 hr tablet Take 0.5 tablets (12.5 mg total) by mouth daily. 30 tablet 3  . Multiple Vitamin (MULTIVITAMIN WITH MINERALS) TABS tablet Take 1 tablet by mouth daily.    Marland Kitchen omeprazole (PRILOSEC) 40 MG capsule Take 1 capsule (40 mg total) by mouth daily. 30 capsule 3  . polyethylene glycol (MIRALAX / GLYCOLAX) 17 g packet Take 17 g by mouth daily as needed.     No current facility-administered medications on file prior to visit.    Allergies  Allergen Reactions  . Percocet [Oxycodone-Acetaminophen] Rash    Family History  Problem Relation Age of Onset  . Diabetes Mother   . Hyperlipidemia Father   . Hypertension Father   .  Adrenal disorder Neg Hx     BP (!) 160/40 (BP Location: Right Arm, Patient Position: Sitting, Cuff Size: Normal)   Pulse 64   Ht '5\' 5"'$  (1.651 m)   Wt 121 lb (54.9 kg)   SpO2 95%   BMI 20.14 kg/m    Review of Systems Denies weight loss, dizziness, n/v, abd pain, vitiligo, and change in skin tone.      Objective:   Physical Exam VS: see vs page GEN: no distress HEAD: head: no deformity eyes: no periorbital swelling, no proptosis external nose and ears are normal NECK: supple, thyroid is not enlarged CHEST WALL: no deformity LUNGS: clear to auscultation CV: reg rate and rhythm; syst murmur is noted MUSCULOSKELETAL: gait is normal and steady.  EXTEMITIES: no deformity.  no leg edema NEURO:  readily moves all 4's.  sensation is intact to touch on all 4's.   SKIN:  Normal texture and  temperature.  No rash or suspicious lesion is visible.   NODES:  None palpable at the neck.  PSYCH: alert, cooperative.  Does not appear anxious nor depressed.    outside test results are reviewed: ACTH stimulation test is done (2012): baseline cortisol level=0.5 then Cosyntropin 250 mcg is given im 45 minutes later, cortisol level=15 (borderline normal response).  Lab Results  Component Value Date   CREATININE 2.49 (H) 11/24/2020   BUN 48 (H) 11/24/2020   NA 133 (L) 11/24/2020   K 3.7 11/24/2020   CL 100 11/24/2020   CO2 21 (L) 11/24/2020     CT (2021): adrenals are normal.    She was admitted in 2012 for pneumonia complicated by septic shock.   She responded clinically to solu-cortef.    Lab Results  Component Value Date   TSH 3.03 02/13/2021   T3TOTAL 22.6 (L) 05/11/2011   T4TOTAL 0.4 (L) 05/11/2011   ACTH stimulation test is done: baseline cortisol level=16 then Cosyntropin 250 mcg is given im 45 minutes later, cortisol level=16 (borderline normal response)  Lab Results  Component Value Date   HGBA1C 8.6 (H) 02/13/2021       Assessment & Plan:  Hypothyroidism, well-replaced.  Please continue the same synthroid.   Adrenal insuff: this is a borderline normal response, after being off cortef for just a few days.  Pt is advised to stay off cortef, and come back for a follow-up appointment in 1 month.  Type 2 DM, with stage 5 CRI: uncontrolled.  I have sent a prescription to your pharmacy, for repaglinide.  Patient Instructions  Blood tests are requested for you today.  We'll let you know about the results.  These tests will tell us if you can go off the hydrocortisone.  If you can go off it, that would help your blood pressure, diabetes, and heart.

## 2021-02-13 NOTE — Patient Instructions (Signed)
Blood tests are requested for you today.  We'll let you know about the results.  These tests will tell us if you can go off the hydrocortisone.  If you can go off it, that would help your blood pressure, diabetes, and heart.

## 2021-02-15 ENCOUNTER — Other Ambulatory Visit: Payer: Self-pay | Admitting: Endocrinology

## 2021-02-15 MED ORDER — REPAGLINIDE 0.5 MG PO TABS: 0.5000 mg | ORAL_TABLET | Freq: Two times a day (BID) | ORAL | 11 refills | Status: DC

## 2021-02-16 MED FILL — REPAGLINIDE 0.5 MG TABS: 0.5 | 30 days supply | Qty: 60 | Fill #0

## 2021-02-16 MED FILL — HYDROCORTISONE 10 MG TABLET: 10 | 30 days supply | Qty: 60 | Fill #0

## 2021-02-17 ENCOUNTER — Encounter: Payer: Self-pay | Admitting: Endocrinology

## 2021-02-17 LAB — HEMOGLOBIN A1C
Hgb A1c MFr Bld: 8.6 % of total Hgb — ABNORMAL HIGH (ref <5.7)
Mean Plasma Glucose: 200 mg/dL
eAG (mmol/L): 11.1 mmol/L

## 2021-02-17 LAB — CORTISOL
Cortisol, Plasma: 16.1 ug/dL
Cortisol, Plasma: 15.7 ug/dL

## 2021-02-17 LAB — TSH: TSH: 3.03 mIU/L (ref 0.40–4.50)

## 2021-02-17 LAB — FRUCTOSAMINE: Fructosamine: 461 umol/L — ABNORMAL HIGH (ref 205–285)

## 2021-02-17 LAB — ACTH: C206 ACTH: <5 pg/mL — ABNORMAL LOW (ref 6–50)

## 2021-02-20 ENCOUNTER — Encounter: Payer: Self-pay | Admitting: Internal Medicine

## 2021-02-20 MED FILL — OMEPRAZOLE DR 40 MG CAPSULE: 40 | 30 days supply | Qty: 30 | Fill #2

## 2021-02-20 MED FILL — AMLODIPINE BESYLATE 10 MG T: 10 | 30 days supply | Qty: 30 | Fill #3

## 2021-03-02 ENCOUNTER — Ambulatory Visit: Payer: Self-pay | Admitting: Family Medicine

## 2021-03-04 ENCOUNTER — Other Ambulatory Visit: Payer: Self-pay | Admitting: Internal Medicine

## 2021-03-13 ENCOUNTER — Ambulatory Visit
Admission: RE | Admit: 2021-03-13 | Discharge: 2021-03-13 | Disposition: A | Discharge status: Home / Self Care | Payer: Self-pay | Source: Ambulatory Visit | Attending: Internal Medicine | Admitting: Internal Medicine

## 2021-03-13 DIAGNOSIS — N184 Chronic kidney disease, stage 4 (severe): Secondary | ICD-10-CM

## 2021-03-21 ENCOUNTER — Other Ambulatory Visit: Payer: Self-pay

## 2021-03-24 ENCOUNTER — Other Ambulatory Visit: Payer: Self-pay

## 2021-03-24 MED FILL — Pravastatin Sodium Tab 20 MG: ORAL | 30 days supply | Qty: 30 | Fill #0 | Status: CN

## 2021-03-24 MED FILL — Omeprazole Cap Delayed Release 40 MG: ORAL | 30 days supply | Qty: 30 | Fill #0 | Status: CN

## 2021-03-24 MED FILL — Chlorthalidone Tab 25 MG: ORAL | 30 days supply | Qty: 15 | Fill #0 | Status: CN

## 2021-03-24 MED FILL — Repaglinide Tab 0.5 MG: ORAL | 30 days supply | Qty: 60 | Fill #0 | Status: CN

## 2021-03-25 ENCOUNTER — Other Ambulatory Visit: Payer: Self-pay

## 2021-03-30 ENCOUNTER — Other Ambulatory Visit: Payer: Self-pay

## 2021-03-30 ENCOUNTER — Ambulatory Visit: Payer: Self-pay | Attending: Family Medicine | Admitting: Internal Medicine

## 2021-03-30 ENCOUNTER — Encounter: Payer: Self-pay | Admitting: Internal Medicine

## 2021-03-30 VITALS — BP 150/60 | HR 86 | Resp 16 | Wt 112.4 lb

## 2021-03-30 DIAGNOSIS — E785 Hyperlipidemia, unspecified: Secondary | ICD-10-CM

## 2021-03-30 DIAGNOSIS — I129 Hypertensive chronic kidney disease with stage 1 through stage 4 chronic kidney disease, or unspecified chronic kidney disease: Secondary | ICD-10-CM

## 2021-03-30 DIAGNOSIS — E1169 Type 2 diabetes mellitus with other specified complication: Secondary | ICD-10-CM

## 2021-03-30 DIAGNOSIS — N185 Chronic kidney disease, stage 5: Secondary | ICD-10-CM

## 2021-03-30 DIAGNOSIS — M1731 Unilateral post-traumatic osteoarthritis, right knee: Secondary | ICD-10-CM

## 2021-03-30 DIAGNOSIS — E1122 Type 2 diabetes mellitus with diabetic chronic kidney disease: Secondary | ICD-10-CM

## 2021-03-30 DIAGNOSIS — I5032 Chronic diastolic (congestive) heart failure: Secondary | ICD-10-CM

## 2021-03-30 DIAGNOSIS — I120 Hypertensive chronic kidney disease with stage 5 chronic kidney disease or end stage renal disease: Secondary | ICD-10-CM

## 2021-03-30 DIAGNOSIS — IMO0001 Reserved for inherently not codable concepts without codable children: Secondary | ICD-10-CM

## 2021-03-30 MED ORDER — METOPROLOL SUCCINATE ER 25 MG PO TB24
25.0000 mg | ORAL_TABLET | Freq: Every day | ORAL | 3 refills | Status: DC
  Filled 2021-03-30: qty 30, 30d supply, fill #0
  Filled 2021-05-06: qty 30, 30d supply, fill #1

## 2021-03-30 MED ORDER — HYDRALAZINE HCL 100 MG PO TABS
100.0000 mg | ORAL_TABLET | Freq: Three times a day (TID) | ORAL | 3 refills | Status: DC
  Filled 2021-03-30: qty 90, 30d supply, fill #0
  Filled 2021-05-19: qty 90, 30d supply, fill #1

## 2021-03-30 NOTE — Progress Notes (Signed)
Patient ID: [name], female    DOB: 08/25/1961  MRN: [id number]  CC: re-establish   Subjective: [name] is a 60 y.o. female who presents for chronic ds management.  Her daughter [name] is with her.  Previous PCP was Dr. Margarita Rana.  They decided to change because they felt that they were not getting the assistance they needed. Her concerns today include:  [name] is a 60 year old female with a history of chronic adrenal insufficiency (followed by Dr. Loanne Drilling), HTN, DM type II, stage 4 CKD, diastolic CHF (newly diagnosed with EF of 65 to 70%, followed by Dr. Harrell Gave), hypothyroid, GERD  HYPERTENSION/dCHF Currently taking: see medication list Med Adherence: '[x]'$  Yes    '[]'$  No Medication side effects: '[]'$  Yes    '[x]'$  No Adherence with salt restriction: '[x]'$  Yes    '[]'$  No Home Monitoring?: '[x]'$  Yes    '[]'$  No Monitoring Frequency: Daily Home BP results range: 148/60 this morning.  I note that her blood pressure readings 1 visits with other specialists this year have been elevated. SOB? '[x]'$  Yes -endorses dyspnea on exertion and some PND at nights. Chest Pain?: '[]'$  Yes    '[x]'$  No Leg swelling?: '[x]'$  Yes -sometimes but not much today. Headaches?: '[]'$  Yes    '[x]'$  No Dizziness? '[]'$  Yes    '[x]'$  No Comments:   DM: Recent A1c was 8.6 when she saw Dr. Loanne Drilling in February.  He added her on Prandin.  Checks blood sugars 3 times a day with meals.  Gives range of 130-140.  Feels she is doing okay with her eating habits.  Hypothyroidism: She is on levothyroxine.  Compliant with taking the medicine.  TSH in February of this year was 3.0.  HL: Taking and tolerating atorvastatin.  Adrenal insufficiency: Cortisol level checked back in February by her endocrinologist was in normal range.  He had her discontinue hydrocortisone.  Complains of some intermittent pain in the right knee especially in damp and cold weather.  She had fractured the knee about 5 years ago.  She then fell and reinjured it  about 2 years ago.  Denies any swelling.  She is not using anything for pain.  CKD stage IV: Followed by a nephrologist Dr. Johnney Ou.  Had renal ultrasound done today with findings suspicious for bilateral renal artery stenosis worst on the right.  Reports compliance with taking her blood pressure medications.  Patient Active Problem List   Diagnosis Date Noted  . CKD (chronic kidney disease) stage 4, GFR 15-29 ml/min (HCC) 11/21/2020  . HTN (hypertension) 11/21/2020  . New onset of congestive heart failure ([place]) 11/21/2020  . Adrenal insufficiency ([place]) 03/27/2018  . Dehydration 12/09/2017  . Abdominal pain, bilateral lower quadrant 12/09/2017  . Hypoglycemia 07/08/2017  . Hypoglycemia associated with diabetes ([place]) 05/10/2017  . Malnutrition of moderate degree 05/10/2017     Current Outpatient Medications on File Prior to Visit  Medication Sig Dispense Refill  . acetaminophen (TYLENOL) 325 MG tablet Take 2 tablets (650 mg total) by mouth every 6 (six) hours as needed for mild pain.    Marland Kitchen amLODipine (NORVASC) 10 MG tablet TAKE 1 TABLET (10 MG TOTAL) BY MOUTH DAILY. 30 tablet 3  . aspirin 81 MG chewable tablet Chew 81 mg by mouth daily.    Marland Kitchen atorvastatin (LIPITOR) 20 MG tablet TAKE 1 TABLET (20 MG TOTAL) BY MOUTH DAILY. 30 tablet 3  . Calcium Carbonate-Vit D-Min (CALCIUM 1200 PO) Take 1,200 mg by mouth daily.    Marland Kitchen  chlorthalidone (HYGROTON) 25 MG tablet TAKE 1/2 TABLET BY MOUTH ONCE A DAY 15 tablet 12  . fluticasone (FLONASE) 50 MCG/ACT nasal spray PLACE 2 SPRAYS INTO BOTH NOSTRILS DAILY. 16 g 1  . isosorbide mononitrate (IMDUR) 60 MG 24 hr tablet TAKE 1 TABLET BY MOUTH DAILY 90 tablet 3  . levothyroxine (SYNTHROID) 150 MCG tablet TAKE 1 TABLET (150 MCG TOTAL) BY MOUTH DAILY BEFORE BREAKFAST. 30 tablet 3  . Multiple Vitamin (MULTIVITAMIN WITH MINERALS) TABS tablet Take 1 tablet by mouth daily.    Marland Kitchen omeprazole (PRILOSEC) 40 MG capsule TAKE 1 CAPSULE (40 MG TOTAL) BY MOUTH DAILY. 30 capsule 3   . polyethylene glycol (MIRALAX / GLYCOLAX) 17 g packet Take 17 g by mouth daily as needed.    . pravastatin (PRAVACHOL) 20 MG tablet TAKE 1 TABLET BY MOUTH DAILY 30 tablet 12  . repaglinide (PRANDIN) 0.5 MG tablet TAKE 1 TABLET (0.5 MG TOTAL) BY MOUTH 2 (TWO) TIMES DAILY BEFORE A MEAL. 60 tablet 11   No current facility-administered medications on file prior to visit.    Allergies  Allergen Reactions  . Percocet [Oxycodone-Acetaminophen] Rash    Social History   Socioeconomic History  . Marital status: Single    Spouse name: Not on file  . Number of children: Not on file  . Years of education: Not on file  . Highest education level: Not on file  Occupational History  . Not on file  Tobacco Use  . Smoking status: Never Smoker  . Smokeless tobacco: Never Used  Substance and Sexual Activity  . Alcohol use: No  . Drug use: No  . Sexual activity: Not on file  Other Topics Concern  . Not on file  Social History Narrative  . Not on file   Social Determinants of Health   Financial Resource Strain: Not on file  Food Insecurity: Not on file  Transportation Needs: Not on file  Physical Activity: Not on file  Stress: Not on file  Social Connections: Not on file  Intimate Partner Violence: Not on file    Family History  Problem Relation Age of Onset  . Diabetes Mother   . Hyperlipidemia Father   . Hypertension Father   . Adrenal disorder Neg Hx     Past Surgical History:  Procedure Laterality Date  . CESAREAN SECTION      ROS: Review of Systems Negative except as stated above  PHYSICAL EXAM: BP (!) 150/60   Pulse 86   Resp 16   Wt 112 lb 6.4 oz (51 kg)   SpO2 97%   BMI 18.70 kg/m   Wt Readings from Last 3 Encounters:  03/30/21 112 lb 6.4 oz (51 kg)  02/13/21 121 lb (54.9 kg)  12/26/20 111 lb 12.8 oz (50.7 kg)    Physical Exam  General appearance - alert, well appearing, and in no distress Mental status - normal mood, behavior, speech, dress, motor  activity, and thought processes Neck - supple, no significant adenopathy Chest - clear to auscultation, no wheezes, rales or rhonchi, symmetric air entry Heart - normal rate, regular rhythm, normal S1, S2, no murmurs, rubs, clicks or gallops.  No JVD. Musculoskeletal -right knee: Mild joint enlargement.  No edema noted.  She has mild crepitus on the lateral aspect of the knee with passive range of motion.  She has good range of motion.   Extremities -trace ankle edema.  CMP Latest Ref Rng & Units 11/24/2020 11/23/2020 11/22/2020  Glucose 70 - 99 mg/dL  149(H) 191(H) 215(H)  BUN 8 - 23 mg/dL 48(H) 48(H) 51(H)  Creatinine 0.44 - 1.00 mg/dL 2.49(H) 2.51(H) 2.89(H)  Sodium 135 - 145 mmol/L 133(L) 132(L) 130(L)  Potassium 3.5 - 5.1 mmol/L 3.7 3.7 3.5  Chloride 98 - 111 mmol/L 100 99 98  CO2 22 - 32 mmol/L 21(L) 21(L) 21(L)  Calcium 8.9 - 10.3 mg/dL 8.5(L) 8.3(L) 8.3(L)  Total Protein 6.5 - 8.1 g/dL - - 7.1  Total Bilirubin 0.3 - 1.2 mg/dL - - 0.8  Alkaline Phos 38 - 126 U/L - - 112  AST 15 - 41 U/L - - 30  ALT 0 - 44 U/L - - 32   Lipid Panel     Component Value Date/Time   CHOL 218 (H) 06/13/2011 0810   TRIG 85 06/13/2011 0810   HDL 65 06/13/2011 0810   CHOLHDL 3.4 06/13/2011 0810   VLDL 17 06/13/2011 0810   LDLCALC 136 (H) 06/13/2011 0810    CBC    Component Value Date/Time   WBC 10.7 (H) 11/23/2020 0629   RBC 2.27 (L) 11/23/2020 0629   HGB 8.6 (L) 11/23/2020 1603   HCT 25.9 (L) 11/23/2020 1603   PLT 152 11/23/2020 0629   MCV 93.4 11/23/2020 0629   MCH 30.0 11/23/2020 0629   MCHC 32.1 11/23/2020 0629   RDW 13.0 11/23/2020 0629   LYMPHSABS 1.5 11/23/2020 0629   MONOABS 0.6 11/23/2020 0629   EOSABS 0.2 11/23/2020 0629   BASOSABS 0.1 11/23/2020 0629    ASSESSMENT AND PLAN: 1. 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  Continue current medications.  Increase metoprolol to 25 mg daily.  Continue home blood pressure monitoring with goal  being 130/80 or lower. - metoprolol succinate (TOPROL-XL) 25 MG 24 hr tablet; Take 1 tablet (25 mg total) by mouth daily.  Dispense: 90 tablet; Refill: 3 - hydrALAZINE (APRESOLINE) 100 MG tablet; Take 1 tablet (100 mg total) by mouth 3 (three) times daily.  Dispense: 90 tablet; Refill: 3  2. Type 2 diabetes mellitus with chronic kidney disease and hypertension ([place]) Reported blood sugar readings are good.  Continue Prandin.  3. Post-traumatic osteoarthritis of right knee Recommend over-the-counter Tylenol 650 mg 3 times a day as needed.  4. Hyperlipidemia associated with type 2 diabetes mellitus ([place]) Continue atorvastatin  5. Chronic diastolic congestive heart failure (HCC) Stable and compensated based on physical exam. Schedule follow-up with Dr. Harrell Gave in June of this year.    Patient was given the opportunity to ask questions.  Patient verbalized understanding of the plan and was able to repeat key elements of the plan.   No orders of the defined types were placed in this encounter.    Requested Prescriptions   Signed Prescriptions Disp Refills  . metoprolol succinate (TOPROL-XL) 25 MG 24 hr tablet 90 tablet 3    Sig: Take 1 tablet (25 mg total) by mouth daily.  . hydrALAZINE (APRESOLINE) 100 MG tablet 90 tablet 3    Sig: Take 1 tablet (100 mg total) by mouth 3 (three) times daily.    Return in about 4 months (around 07/30/2021).  Karle Plumber, MD, FACP

## 2021-03-30 NOTE — Patient Instructions (Signed)
Your blood pressure is not well controlled.  The goal is 130/80 or lower.  Increase metoprolol to 25 mg daily.

## 2021-03-31 ENCOUNTER — Other Ambulatory Visit: Payer: Self-pay

## 2021-04-01 ENCOUNTER — Other Ambulatory Visit: Payer: Self-pay

## 2021-04-01 MED FILL — Repaglinide Tab 0.5 MG: ORAL | 30 days supply | Qty: 60 | Fill #0 | Status: AC

## 2021-04-02 ENCOUNTER — Other Ambulatory Visit: Payer: Self-pay

## 2021-04-02 MED FILL — Omeprazole Cap Delayed Release 40 MG: ORAL | 30 days supply | Qty: 30 | Fill #0 | Status: AC

## 2021-04-02 MED FILL — Isosorbide Mononitrate Tab ER 24HR 60 MG: ORAL | 30 days supply | Qty: 30 | Fill #0 | Status: AC

## 2021-04-02 MED FILL — Chlorthalidone Tab 25 MG: ORAL | 30 days supply | Qty: 15 | Fill #0 | Status: AC

## 2021-04-06 ENCOUNTER — Encounter: Payer: Self-pay | Admitting: Internal Medicine

## 2021-04-06 MED FILL — Pravastatin Sodium Tab 20 MG: ORAL | 30 days supply | Qty: 30 | Fill #0 | Status: AC

## 2021-04-06 MED FILL — Levothyroxine Sodium Tab 150 MCG: ORAL | 30 days supply | Qty: 30 | Fill #0 | Status: AC

## 2021-04-07 ENCOUNTER — Other Ambulatory Visit: Payer: Self-pay

## 2021-04-08 ENCOUNTER — Other Ambulatory Visit: Payer: Self-pay

## 2021-04-08 ENCOUNTER — Other Ambulatory Visit: Payer: Self-pay | Admitting: Family Medicine

## 2021-04-08 ENCOUNTER — Encounter: Payer: Self-pay | Admitting: Internal Medicine

## 2021-04-08 DIAGNOSIS — N183 Chronic kidney disease, stage 3 unspecified: Secondary | ICD-10-CM

## 2021-04-08 DIAGNOSIS — E1122 Type 2 diabetes mellitus with diabetic chronic kidney disease: Secondary | ICD-10-CM

## 2021-04-08 MED ORDER — AMLODIPINE BESYLATE 10 MG PO TABS
ORAL_TABLET | Freq: Every day | ORAL | 3 refills | Status: DC
  Filled 2021-04-08 – 2021-05-06 (×2): qty 30, 30d supply, fill #0
  Filled 2021-06-12: qty 30, 30d supply, fill #1
  Filled 2021-07-15: qty 30, 30d supply, fill #2
  Filled 2021-09-07: qty 30, 30d supply, fill #3

## 2021-04-09 ENCOUNTER — Other Ambulatory Visit: Payer: Self-pay

## 2021-04-16 ENCOUNTER — Other Ambulatory Visit: Payer: Self-pay

## 2021-04-18 ENCOUNTER — Encounter: Payer: Self-pay | Admitting: Internal Medicine

## 2021-04-19 DIAGNOSIS — N184 Chronic kidney disease, stage 4 (severe): Secondary | ICD-10-CM

## 2021-04-19 HISTORY — DX: Chronic kidney disease, stage 4 (severe): N18.4

## 2021-04-23 ENCOUNTER — Other Ambulatory Visit: Payer: Self-pay

## 2021-04-23 MED FILL — Atorvastatin Calcium Tab 20 MG (Base Equivalent): ORAL | 30 days supply | Qty: 30 | Fill #0 | Status: AC

## 2021-04-24 ENCOUNTER — Other Ambulatory Visit: Payer: Self-pay

## 2021-05-06 ENCOUNTER — Other Ambulatory Visit: Payer: Self-pay

## 2021-05-06 ENCOUNTER — Other Ambulatory Visit: Payer: Self-pay | Admitting: Family Medicine

## 2021-05-06 DIAGNOSIS — K219 Gastro-esophageal reflux disease without esophagitis: Secondary | ICD-10-CM

## 2021-05-06 MED ORDER — OMEPRAZOLE 40 MG PO CPDR
DELAYED_RELEASE_CAPSULE | Freq: Every day | ORAL | 0 refills | Status: DC
  Filled 2021-05-06: qty 30, 30d supply, fill #0
  Filled 2021-06-12: qty 30, 30d supply, fill #1
  Filled 2021-07-15: qty 30, 30d supply, fill #2

## 2021-05-06 MED FILL — Repaglinide Tab 0.5 MG: ORAL | 30 days supply | Qty: 60 | Fill #1 | Status: AC

## 2021-05-06 MED FILL — Pravastatin Sodium Tab 20 MG: ORAL | 30 days supply | Qty: 30 | Fill #1 | Status: AC

## 2021-05-06 MED FILL — Isosorbide Mononitrate Tab ER 24HR 60 MG: ORAL | 30 days supply | Qty: 30 | Fill #1 | Status: AC

## 2021-05-06 MED FILL — Chlorthalidone Tab 25 MG: ORAL | 30 days supply | Qty: 15 | Fill #1 | Status: AC

## 2021-05-07 ENCOUNTER — Other Ambulatory Visit: Payer: Self-pay

## 2021-05-12 ENCOUNTER — Inpatient Hospital Stay (HOSPITAL_BASED_OUTPATIENT_CLINIC_OR_DEPARTMENT_OTHER)
Admission: EM | Admit: 2021-05-12 | Discharge: 2021-05-16 | DRG: 392 | Disposition: A | Discharge status: Home / Self Care | Payer: Medicaid Other | Attending: Internal Medicine | Admitting: Internal Medicine

## 2021-05-12 ENCOUNTER — Other Ambulatory Visit: Payer: Self-pay

## 2021-05-12 ENCOUNTER — Emergency Department (HOSPITAL_BASED_OUTPATIENT_CLINIC_OR_DEPARTMENT_OTHER): Payer: Medicaid Other

## 2021-05-12 ENCOUNTER — Encounter (HOSPITAL_BASED_OUTPATIENT_CLINIC_OR_DEPARTMENT_OTHER): Payer: Self-pay | Admitting: Emergency Medicine

## 2021-05-12 DIAGNOSIS — E876 Hypokalemia: Secondary | ICD-10-CM | POA: Diagnosis present

## 2021-05-12 DIAGNOSIS — G9389 Other specified disorders of brain: Secondary | ICD-10-CM

## 2021-05-12 DIAGNOSIS — K5641 Fecal impaction: Secondary | ICD-10-CM | POA: Diagnosis not present

## 2021-05-12 DIAGNOSIS — E162 Hypoglycemia, unspecified: Secondary | ICD-10-CM

## 2021-05-12 DIAGNOSIS — E271 Primary adrenocortical insufficiency: Secondary | ICD-10-CM | POA: Diagnosis not present

## 2021-05-12 DIAGNOSIS — E1122 Type 2 diabetes mellitus with diabetic chronic kidney disease: Secondary | ICD-10-CM | POA: Diagnosis not present

## 2021-05-12 DIAGNOSIS — Z8249 Family history of ischemic heart disease and other diseases of the circulatory system: Secondary | ICD-10-CM

## 2021-05-12 DIAGNOSIS — E039 Hypothyroidism, unspecified: Secondary | ICD-10-CM | POA: Diagnosis present

## 2021-05-12 DIAGNOSIS — I129 Hypertensive chronic kidney disease with stage 1 through stage 4 chronic kidney disease, or unspecified chronic kidney disease: Secondary | ICD-10-CM | POA: Diagnosis present

## 2021-05-12 DIAGNOSIS — R4182 Altered mental status, unspecified: Secondary | ICD-10-CM | POA: Diagnosis not present

## 2021-05-12 DIAGNOSIS — Z20822 Contact with and (suspected) exposure to covid-19: Secondary | ICD-10-CM | POA: Diagnosis not present

## 2021-05-12 DIAGNOSIS — Z79899 Other long term (current) drug therapy: Secondary | ICD-10-CM

## 2021-05-12 DIAGNOSIS — D509 Iron deficiency anemia, unspecified: Secondary | ICD-10-CM | POA: Diagnosis present

## 2021-05-12 DIAGNOSIS — Z79890 Hormone replacement therapy (postmenopausal): Secondary | ICD-10-CM

## 2021-05-12 DIAGNOSIS — R42 Dizziness and giddiness: Secondary | ICD-10-CM

## 2021-05-12 DIAGNOSIS — Z7984 Long term (current) use of oral hypoglycemic drugs: Secondary | ICD-10-CM

## 2021-05-12 DIAGNOSIS — N189 Chronic kidney disease, unspecified: Secondary | ICD-10-CM | POA: Diagnosis present

## 2021-05-12 DIAGNOSIS — N184 Chronic kidney disease, stage 4 (severe): Secondary | ICD-10-CM | POA: Diagnosis not present

## 2021-05-12 DIAGNOSIS — E538 Deficiency of other specified B group vitamins: Secondary | ICD-10-CM | POA: Diagnosis not present

## 2021-05-12 DIAGNOSIS — Z7982 Long term (current) use of aspirin: Secondary | ICD-10-CM | POA: Diagnosis not present

## 2021-05-12 DIAGNOSIS — N179 Acute kidney failure, unspecified: Secondary | ICD-10-CM | POA: Diagnosis not present

## 2021-05-12 DIAGNOSIS — Z833 Family history of diabetes mellitus: Secondary | ICD-10-CM | POA: Diagnosis not present

## 2021-05-12 DIAGNOSIS — Z885 Allergy status to narcotic agent status: Secondary | ICD-10-CM | POA: Diagnosis not present

## 2021-05-12 DIAGNOSIS — D631 Anemia in chronic kidney disease: Secondary | ICD-10-CM | POA: Diagnosis not present

## 2021-05-12 DIAGNOSIS — K5289 Other specified noninfective gastroenteritis and colitis: Principal | ICD-10-CM | POA: Diagnosis present

## 2021-05-12 DIAGNOSIS — E2740 Unspecified adrenocortical insufficiency: Secondary | ICD-10-CM

## 2021-05-12 HISTORY — DX: Heart failure, unspecified: I50.9

## 2021-05-12 HISTORY — DX: Hypothyroidism, unspecified: E03.9

## 2021-05-12 HISTORY — DX: Primary adrenocortical insufficiency: E27.1

## 2021-05-12 LAB — URINALYSIS, ROUTINE W REFLEX MICROSCOPIC
Bilirubin Urine: NEGATIVE
Glucose, UA: NEGATIVE mg/dL
Hgb urine dipstick: NEGATIVE
Ketones, ur: NEGATIVE mg/dL
Leukocytes,Ua: NEGATIVE
Nitrite: NEGATIVE
Protein, ur: 30 mg/dL — AB
Specific Gravity, Urine: 1.015 (ref 1.005–1.030)
pH: 5 (ref 5.0–8.0)

## 2021-05-12 LAB — CBG MONITORING, ED
Glucose-Capillary: 179 mg/dL — ABNORMAL HIGH (ref 70–99)
Glucose-Capillary: 176 mg/dL — ABNORMAL HIGH (ref 70–99)

## 2021-05-12 LAB — CBC
HCT: 23.8 % — ABNORMAL LOW (ref 36.0–46.0)
Hemoglobin: 7.9 g/dL — ABNORMAL LOW (ref 12.0–15.0)
MCH: 28.5 pg (ref 26.0–34.0)
MCHC: 33.2 g/dL (ref 30.0–36.0)
MCV: 85.9 fL (ref 80.0–100.0)
Platelets: 205 10*3/uL (ref 150–400)
RBC: 2.77 MIL/uL — ABNORMAL LOW (ref 3.87–5.11)
RDW: 13.8 % (ref 11.5–15.5)
WBC: 10.7 10*3/uL — ABNORMAL HIGH (ref 4.0–10.5)
nRBC: 0 % (ref 0.0–0.2)

## 2021-05-12 LAB — COMPREHENSIVE METABOLIC PANEL
ALT: 14 U/L (ref 0–44)
AST: 28 U/L (ref 15–41)
Albumin: 4.1 g/dL (ref 3.5–5.0)
Alkaline Phosphatase: 103 U/L (ref 38–126)
Anion gap: 12 (ref 5–15)
BUN: 27 mg/dL — ABNORMAL HIGH (ref 8–23)
CO2: 20 mmol/L — ABNORMAL LOW (ref 22–32)
Calcium: 9 mg/dL (ref 8.9–10.3)
Chloride: 103 mmol/L (ref 98–111)
Creatinine, Ser: 2.49 mg/dL — ABNORMAL HIGH (ref 0.44–1.00)
GFR, Estimated: 21 mL/min — ABNORMAL LOW (ref >60)
Glucose, Bld: 176 mg/dL — ABNORMAL HIGH (ref 70–99)
Potassium: 2.7 mmol/L — CL (ref 3.5–5.1)
Sodium: 135 mmol/L (ref 135–145)
Total Bilirubin: 0.9 mg/dL (ref 0.3–1.2)
Total Protein: 7.4 g/dL (ref 6.5–8.1)

## 2021-05-12 LAB — AMMONIA: Ammonia: 34 umol/L (ref 9–35)

## 2021-05-12 LAB — RESP PANEL BY RT-PCR (FLU A&B, COVID) ARPGX2
Influenza A by PCR: NEGATIVE
Influenza B by PCR: NEGATIVE
SARS Coronavirus 2 by RT PCR: NEGATIVE

## 2021-05-12 LAB — LIPASE, BLOOD: Lipase: 33 U/L (ref 11–51)

## 2021-05-12 MED ORDER — AMLODIPINE BESYLATE 10 MG PO TABS
10.0000 mg | ORAL_TABLET | Freq: Every day | ORAL | Status: DC
  Administered 2021-05-13 – 2021-05-16 (×4): 10 mg via ORAL
  Filled 2021-05-12 (×4): qty 1

## 2021-05-12 MED ORDER — PRAVASTATIN SODIUM 10 MG PO TABS
20.0000 mg | ORAL_TABLET | Freq: Every day | ORAL | Status: DC
  Administered 2021-05-13 – 2021-05-16 (×4): 20 mg via ORAL
  Filled 2021-05-12 (×4): qty 2

## 2021-05-12 MED ORDER — SODIUM CHLORIDE 0.9 % IV SOLN: Freq: Once | INTRAVENOUS | Status: AC

## 2021-05-12 MED ORDER — LEVOTHYROXINE SODIUM 75 MCG PO TABS
150.0000 ug | ORAL_TABLET | Freq: Every day | ORAL | Status: DC
  Administered 2021-05-13 – 2021-05-16 (×4): 150 ug via ORAL
  Filled 2021-05-12 (×4): qty 2

## 2021-05-12 MED ORDER — METOPROLOL SUCCINATE ER 25 MG PO TB24
25.0000 mg | ORAL_TABLET | Freq: Every day | ORAL | Status: DC
  Administered 2021-05-13 – 2021-05-16 (×4): 25 mg via ORAL
  Filled 2021-05-12 (×4): qty 1

## 2021-05-12 MED ORDER — ONDANSETRON HCL 4 MG/2ML IJ SOLN
4.0000 mg | Freq: Once | INTRAMUSCULAR | Status: AC | PRN
  Administered 2021-05-12: 4 mg via INTRAVENOUS
  Filled 2021-05-12: qty 2

## 2021-05-12 MED ORDER — POTASSIUM CHLORIDE CRYS ER 20 MEQ PO TBCR
40.0000 meq | EXTENDED_RELEASE_TABLET | Freq: Once | ORAL | Status: AC
  Administered 2021-05-12: 40 meq via ORAL
  Filled 2021-05-12: qty 2

## 2021-05-12 MED ORDER — HYDROCORTISONE NA SUCCINATE PF 100 MG IJ SOLR: 50.0000 mg | Freq: Three times a day (TID) | INTRAMUSCULAR | Status: DC

## 2021-05-12 MED ORDER — ISOSORBIDE MONONITRATE ER 60 MG PO TB24
60.0000 mg | ORAL_TABLET | Freq: Every day | ORAL | Status: DC
  Administered 2021-05-13 – 2021-05-16 (×4): 60 mg via ORAL
  Filled 2021-05-12 (×4): qty 1

## 2021-05-12 MED ORDER — POTASSIUM CHLORIDE 10 MEQ/100ML IV SOLN
10.0000 meq | Freq: Once | INTRAVENOUS | Status: AC
  Administered 2021-05-12: 10 meq via INTRAVENOUS
  Filled 2021-05-12: qty 100

## 2021-05-12 MED ORDER — ATORVASTATIN CALCIUM 10 MG PO TABS
20.0000 mg | ORAL_TABLET | Freq: Every day | ORAL | Status: DC
  Administered 2021-05-13 – 2021-05-16 (×4): 20 mg via ORAL
  Filled 2021-05-12 (×4): qty 2

## 2021-05-12 MED ORDER — PANTOPRAZOLE SODIUM 40 MG PO TBEC
40.0000 mg | DELAYED_RELEASE_TABLET | Freq: Every day | ORAL | Status: DC
  Administered 2021-05-13 – 2021-05-16 (×4): 40 mg via ORAL
  Filled 2021-05-12 (×4): qty 1

## 2021-05-12 MED ORDER — HEPARIN SODIUM (PORCINE) 5000 UNIT/ML IJ SOLN
5000.0000 [IU] | Freq: Three times a day (TID) | INTRAMUSCULAR | Status: DC
  Administered 2021-05-13 – 2021-05-16 (×9): 5000 [IU] via SUBCUTANEOUS
  Filled 2021-05-12 (×9): qty 1

## 2021-05-12 MED ORDER — HYDROCORTISONE NA SUCCINATE PF 100 MG IJ SOLR
100.0000 mg | Freq: Once | INTRAMUSCULAR | Status: AC
  Administered 2021-05-12: 100 mg via INTRAVENOUS
  Filled 2021-05-12: qty 2

## 2021-05-12 MED ORDER — HYDRALAZINE HCL 50 MG PO TABS
100.0000 mg | ORAL_TABLET | Freq: Three times a day (TID) | ORAL | Status: DC
  Administered 2021-05-13 – 2021-05-16 (×10): 100 mg via ORAL
  Filled 2021-05-12 (×10): qty 2

## 2021-05-12 MED ORDER — INSULIN ASPART 100 UNIT/ML IJ SOLN
0.0000 [IU] | Freq: Three times a day (TID) | INTRAMUSCULAR | Status: DC
  Administered 2021-05-13 (×2): 2 [IU] via SUBCUTANEOUS
  Administered 2021-05-14 (×2): 3 [IU] via SUBCUTANEOUS
  Administered 2021-05-15 – 2021-05-16 (×2): 2 [IU] via SUBCUTANEOUS

## 2021-05-12 NOTE — ED Notes (Signed)
Patient report called to [place], [place]

## 2021-05-12 NOTE — ED Provider Notes (Signed)
[facility] EMERGENCY DEPT Provider Note   CSN: [id number] Arrival date & time: 05/12/21  1317     History Chief Complaint  Patient presents with  . Weakness    [name] is a 60 y.o. female.  HPI   60 year old female past medical history of DM, HTN, CKD, adrenal insufficiency presents the emergency department accompanied by her daughter-in-law for concern of drowsiness and altered mental status.  Apparently over the past 2 days patient has been sleeping majority of the day.  She also had multiple episodes of vomiting prior to arrival in the car.  She is usually independent and takes care of herself however has been very weak over the past couple days and unable to ambulate.  Drowsy on exam, does not participate in history.  Every now and then wakes up and has clear speech, states that she is having abdominal pain and demands for a blanket because she is cold and denies any pain complaints.  Moves all 4 extremities.  Past Medical History:  Diagnosis Date  . Diabetes mellitus without complication ([place])   . Hypertension   . Thyroid disease     Patient Active Problem List   Diagnosis Date Noted  . CKD (chronic kidney disease) stage 4, GFR 15-29 ml/min (HCC) 11/21/2020  . HTN (hypertension) 11/21/2020  . New onset of congestive heart failure ([place]) 11/21/2020  . Adrenal insufficiency ([place]) 03/27/2018  . Dehydration 12/09/2017  . Abdominal pain, bilateral lower quadrant 12/09/2017  . Hypoglycemia 07/08/2017  . Hypoglycemia associated with diabetes ([place]) 05/10/2017  . Malnutrition of moderate degree 05/10/2017    Past Surgical History:  Procedure Laterality Date  . CESAREAN SECTION       OB History   No obstetric history on file.     Family History  Problem Relation Age of Onset  . Diabetes Mother   . Hyperlipidemia Father   . Hypertension Father   . Adrenal disorder Neg Hx     Social History   Tobacco Use  . Smoking status: Never Smoker  .  Smokeless tobacco: Never Used  Substance Use Topics  . Alcohol use: No  . Drug use: No    Home Medications Prior to Admission medications   Medication Sig Start Date End Date Taking? Authorizing Provider  acetaminophen (TYLENOL) 325 MG tablet Take 2 tablets (650 mg total) by mouth every 6 (six) hours as needed for mild pain. 03/31/18   Dixie Dials, MD  amLODipine (NORVASC) 10 MG tablet TAKE 1 TABLET (10 MG TOTAL) BY MOUTH DAILY. 04/08/21 04/08/22  Ladell Pier, MD  aspirin 81 MG chewable tablet Chew 81 mg by mouth daily.    [provider]  atorvastatin (LIPITOR) 20 MG tablet TAKE 1 TABLET (20 MG TOTAL) BY MOUTH DAILY. 12/02/20 12/02/21  Charlott Rakes, MD  Calcium Carbonate-Vit D-Min (CALCIUM 1200 PO) Take 1,200 mg by mouth daily.    [provider]  chlorthalidone (HYGROTON) 25 MG tablet TAKE 1/2 TABLET BY MOUTH ONCE A DAY 02/03/21 02/03/22  Justin Mend, MD  fluticasone (FLONASE) 50 MCG/ACT nasal spray PLACE 2 SPRAYS INTO BOTH NOSTRILS DAILY. 10/29/20 10/29/21  Charlott Rakes, MD  hydrALAZINE (APRESOLINE) 100 MG tablet Take 1 tablet (100 mg total) by mouth 3 (three) times daily. 03/30/21   Ladell Pier, MD  isosorbide mononitrate (IMDUR) 60 MG 24 hr tablet TAKE 1 TABLET BY MOUTH DAILY 03/04/21 03/04/22  Justin Mend, MD  levothyroxine (SYNTHROID) 150 MCG tablet TAKE 1 TABLET (150 MCG TOTAL)  BY MOUTH DAILY BEFORE BREAKFAST. 10/29/20 10/29/21  Charlott Rakes, MD  metoprolol succinate (TOPROL-XL) 25 MG 24 hr tablet Take 1 tablet (25 mg total) by mouth daily. 03/30/21   Ladell Pier, MD  Multiple Vitamin (MULTIVITAMIN WITH MINERALS) TABS tablet Take 1 tablet by mouth daily.    [provider]  omeprazole (PRILOSEC) 40 MG capsule TAKE 1 CAPSULE (40 MG TOTAL) BY MOUTH DAILY. 05/06/21 05/06/22  Charlott Rakes, MD  polyethylene glycol (MIRALAX / GLYCOLAX) 17 g packet Take 17 g by mouth daily as needed.    [provider]  pravastatin  (PRAVACHOL) 20 MG tablet TAKE 1 TABLET BY MOUTH DAILY 02/03/21 02/03/22  Justin Mend, MD  repaglinide (PRANDIN) 0.5 MG tablet TAKE 1 TABLET (0.5 MG TOTAL) BY MOUTH 2 (TWO) TIMES DAILY BEFORE A MEAL. 02/15/21 02/15/22  Renato Shin, MD    Allergies    Percocet [oxycodone-acetaminophen]  Review of Systems   Review of Systems  Unable to perform ROS: Mental status change    Physical Exam Updated Vital Signs BP (!) 133/48   Pulse 78   Temp 97.7 F (36.5 C) (Oral)   Resp 16   Ht '5\' 5"'$  (1.651 m)   Wt 51.2 kg   SpO2 97%   BMI 18.77 kg/m   Physical Exam Vitals and nursing note reviewed.  Constitutional:      Appearance: Normal appearance.     Comments: Drowsy but arousable, randomly wakes up and is verbal and articulate  HENT:     Head: Normocephalic.     Mouth/Throat:     Mouth: Mucous membranes are moist.  Eyes:     Pupils: Pupils are equal, round, and reactive to light.  Cardiovascular:     Rate and Rhythm: Normal rate.  Pulmonary:     Effort: Pulmonary effort is normal. No respiratory distress.  Abdominal:     Palpations: Abdomen is soft.     Tenderness: There is no abdominal tenderness.  Skin:    General: Skin is warm.  Neurological:     General: No focal deficit present.     Mental Status: She is alert and oriented to person, place, and time. Mental status is at baseline.  Psychiatric:        Mood and Affect: Mood normal.     ED Results / Procedures / Treatments   Labs (all labs ordered are listed, but only abnormal results are displayed) Labs Reviewed  CBC - Abnormal; Notable for the following components:      Result Value   WBC 10.7 (*)    RBC 2.77 (*)    Hemoglobin 7.9 (*)    HCT 23.8 (*)    All other components within normal limits  CBG MONITORING, ED - Abnormal; Notable for the following components:   Glucose-Capillary 179 (*)    All other components within normal limits  RESP PANEL BY RT-PCR (FLU A&B, COVID) ARPGX2  LIPASE, BLOOD   COMPREHENSIVE METABOLIC PANEL  URINALYSIS, ROUTINE W REFLEX MICROSCOPIC  AMMONIA    EKG EKG Interpretation  Date/Time:  Tuesday May 12 2021 13:25:38 EDT Ventricular Rate:  88 PR Interval:  229 QRS Duration: 117 QT Interval:  379 QTC Calculation: 459 R Axis:   -34 Text Interpretation: Sinus rhythm Prolonged PR interval Left atrial enlargement Nonspecific intraventricular conduction delay Probable anterior infarct, age indeterminate Lateral leads are also involved NSR, firstdegree block, lateral ST changes more pronounced Confirmed by Lavenia Atlas [phone]) on 05/12/2021 1:30:59 PM   Radiology No  results found.  Procedures Procedures   Medications Ordered in ED Medications  ondansetron (ZOFRAN) injection 4 mg (4 mg Intravenous Given 05/12/21 1351)    ED Course  I have reviewed the triage vital signs and the nursing notes.  Pertinent labs & imaging results that were available during my care of the patient were reviewed by me and considered in my medical decision making (see chart for details).    MDM Rules/Calculators/A&P                          60 year old female presents the emergency department accompanied by her daughter-in-law for concern for drowsiness, weakness and altered mental status.  Patient had multiple episodes of vomiting in route.  Vitals are stable on arrival.  Patient is very drowsy on exam, not contributory to her history.  But she does randomly wake up, localizes, is articulate, complains of abdominal pain, asks for a blanket, states that she is cold and denies any complaints.  She is able to follow most of the commands and then easily falls back to sleep.  Appears nonfocal on exam.  Plan for infectious and metabolic work-up.  Patient is flu and COVID-negative, she has a baseline AKI as well as a baseline anemia, acute hypokalemia of 2.7.  EKG has baseline ST abnormalities that seems slightly more pronounced here, no active chest pain.  Patient is pending  completion of her metabolic work-up, chest x-ray and CT of the abdomen pelvis.  However given her level of drowsiness and hypokalemia I do anticipate admission.  Patient signed out.  Final Clinical Impression(s) / ED Diagnoses Final diagnoses:  None    Rx / DC Orders ED Discharge Orders    None       Lorelle Gibbs, DO 05/12/21 1542

## 2021-05-12 NOTE — ED Notes (Signed)
While sleeping, pt's SpO2 decreased to 82-84%. Pt placed on 2LNC. SpO2 currently 94-96%.

## 2021-05-12 NOTE — ED Notes (Signed)
Pt's CBG result was 179. Informed Ikrame - RN.

## 2021-05-12 NOTE — ED Triage Notes (Signed)
Fever today , weakness, emesis , dizziness. Hx CHF. Kidney failure . Diabetes.   negative home covid test.

## 2021-05-12 NOTE — ED Provider Notes (Signed)
  Physical Exam  BP (!) 136/46   Pulse 70   Temp 97.7 F (36.5 C) (Oral)   Resp (!) 23   Ht '5\' 5"'$  (1.651 m)   Wt 51.2 kg   SpO2 91%   BMI 18.77 kg/m   Physical Exam  ED Course/Procedures   Clinical Course as of 05/12/21 1856  Tue May 12, 2021  1744 Fecal disimpaction performed with good result. [AW]  [id number] I spoke with Dr. Jonelle Sidle. [AW]    Clinical Course User Index [AW] Arnaldo Natal, MD    Procedures  MDM  Mortimer Fries scented with a variety of symptoms.  She does have a history of adrenal insufficiency.  She had altered mental status, fever, abdominal pain.  She was given stress dose steroids.  ED work-up was unremarkable as far as the exact etiology of her symptoms.  She has an equivocal chest x-ray read for possible pneumonia but no other symptoms of pneumonia.  Regardless, due to her chronic and underlying conditions, she will be admitted for observation.  Cultures are pending.     Arnaldo Natal, MD 05/12/21 [phone]

## 2021-05-12 NOTE — H&P (Incomplete)
History and Physical    [name] [id number] DOB: May 28, 1961 DOA: 05/12/2021  PCP: Ladell Pier, MD  Patient coming from: ***  Chief Complaint: ***  HPI: [name] is a 60 y.o. female with ***   ED Course: ***  Review of Systems: As per HPI, rest all negative.   Past Medical History:  Diagnosis Date  . Diabetes mellitus without complication ([place])   . Hypertension   . Thyroid disease     Past Surgical History:  Procedure Laterality Date  . CESAREAN SECTION       reports that she has never smoked. She has never used smokeless tobacco. She reports that she does not drink alcohol and does not use drugs.  Allergies  Allergen Reactions  . Percocet [Oxycodone-Acetaminophen] Rash    Family History  Problem Relation Age of Onset  . Diabetes Mother   . Hyperlipidemia Father   . Hypertension Father   . Adrenal disorder Neg Hx     Prior to Admission medications   Medication Sig Start Date End Date Taking? Authorizing Provider  acetaminophen (TYLENOL) 325 MG tablet Take 2 tablets (650 mg total) by mouth every 6 (six) hours as needed for mild pain. 03/31/18   Dixie Dials, MD  amLODipine (NORVASC) 10 MG tablet TAKE 1 TABLET (10 MG TOTAL) BY MOUTH DAILY. 04/08/21 04/08/22  Ladell Pier, MD  aspirin 81 MG chewable tablet Chew 81 mg by mouth daily.    [provider]  atorvastatin (LIPITOR) 20 MG tablet TAKE 1 TABLET (20 MG TOTAL) BY MOUTH DAILY. 12/02/20 12/02/21  Charlott Rakes, MD  Calcium Carbonate-Vit D-Min (CALCIUM 1200 PO) Take 1,200 mg by mouth daily.    [provider]  chlorthalidone (HYGROTON) 25 MG tablet TAKE 1/2 TABLET BY MOUTH ONCE A DAY 02/03/21 02/03/22  Justin Mend, MD  fluticasone (FLONASE) 50 MCG/ACT nasal spray PLACE 2 SPRAYS INTO BOTH NOSTRILS DAILY. 10/29/20 10/29/21  Charlott Rakes, MD  hydrALAZINE (APRESOLINE) 100 MG tablet Take 1 tablet (100 mg total) by mouth 3 (three) times daily. 03/30/21   Ladell Pier, MD  isosorbide mononitrate (IMDUR) 60 MG 24 hr tablet TAKE 1 TABLET BY MOUTH DAILY 03/04/21 03/04/22  Justin Mend, MD  levothyroxine (SYNTHROID) 150 MCG tablet TAKE 1 TABLET (150 MCG TOTAL) BY MOUTH DAILY BEFORE BREAKFAST. 10/29/20 10/29/21  Charlott Rakes, MD  metoprolol succinate (TOPROL-XL) 25 MG 24 hr tablet Take 1 tablet (25 mg total) by mouth daily. 03/30/21   Ladell Pier, MD  Multiple Vitamin (MULTIVITAMIN WITH MINERALS) TABS tablet Take 1 tablet by mouth daily.    [provider]  omeprazole (PRILOSEC) 40 MG capsule TAKE 1 CAPSULE (40 MG TOTAL) BY MOUTH DAILY. 05/06/21 05/06/22  Charlott Rakes, MD  polyethylene glycol (MIRALAX / GLYCOLAX) 17 g packet Take 17 g by mouth daily as needed.    [provider]  pravastatin (PRAVACHOL) 20 MG tablet TAKE 1 TABLET BY MOUTH DAILY 02/03/21 02/03/22  Justin Mend, MD  repaglinide (PRANDIN) 0.5 MG tablet TAKE 1 TABLET (0.5 MG TOTAL) BY MOUTH 2 (TWO) TIMES DAILY BEFORE A MEAL. 02/15/21 02/15/22  Renato Shin, MD    Physical Exam: Constitutional: *** Vitals:   05/12/21 2015 05/12/21 2030 05/12/21 2227 05/12/21 2301  BP: (!) 121/37 (!) 140/49 (!) 134/41 (!) 121/39  Pulse: 67 74 79 82  Resp: '18 19 18 20  '$ Temp:   98.4 F (36.9 C) 97.9 F (36.6 C)  TempSrc:   Oral Oral  SpO2: 99% 96% 97% 97%  Weight:      Height:       Eyes: *** ENMT: *** Neck: *** Respiratory: *** Cardiovascular: *** Abdomen: *** Musculoskeletal: *** Skin: *** Neurologic:*** Psychiatric: ***   Labs on Admission: I have personally reviewed following labs and imaging studies  CBC: Recent Labs  Lab 05/12/21 1346  WBC 10.7*  HGB 7.9*  HCT 23.8*  MCV 85.9  PLT [id number]   Basic Metabolic Panel: Recent Labs  Lab 05/12/21 1346  NA 135  K 2.7*  CL 103  CO2 20*  GLUCOSE 176*  BUN 27*  CREATININE 2.49*  CALCIUM 9.0   GFR: Estimated Creatinine Clearance: 19.2 mL/min (A) (by C-G formula based on SCr of 2.49 mg/dL  (H)). Liver Function Tests: Recent Labs  Lab 05/12/21 1346  AST 28  ALT 14  ALKPHOS 103  BILITOT 0.9  PROT 7.4  ALBUMIN 4.1   Recent Labs  Lab 05/12/21 1346  LIPASE 33   Recent Labs  Lab 05/12/21 1448  AMMONIA 34   Coagulation Profile: No results for input(s): INR, PROTIME in the last 168 hours. Cardiac Enzymes: No results for input(s): CKTOTAL, CKMB, CKMBINDEX, TROPONINI in the last 168 hours. BNP (last 3 results) No results for input(s): PROBNP in the last 8760 hours. HbA1C: No results for input(s): HGBA1C in the last 72 hours. CBG: Recent Labs  Lab 05/12/21 1326 05/12/21 2214  GLUCAP 179* 176*   Lipid Profile: No results for input(s): CHOL, HDL, LDLCALC, TRIG, CHOLHDL, LDLDIRECT in the last 72 hours. Thyroid Function Tests: No results for input(s): TSH, T4TOTAL, FREET4, T3FREE, THYROIDAB in the last 72 hours. Anemia Panel: No results for input(s): VITAMINB12, FOLATE, FERRITIN, TIBC, IRON, RETICCTPCT in the last 72 hours. Urine analysis:    Component Value Date/Time   COLORURINE YELLOW 05/12/2021 1720   APPEARANCEUR CLEAR 05/12/2021 1720   LABSPEC 1.015 05/12/2021 1720   PHURINE 5.0 05/12/2021 1720   GLUCOSEU NEGATIVE 05/12/2021 1720   HGBUR NEGATIVE 05/12/2021 1720   BILIRUBINUR NEGATIVE 05/12/2021 1720   KETONESUR NEGATIVE 05/12/2021 1720   PROTEINUR 30 (A) 05/12/2021 1720   UROBILINOGEN 0.2 06/12/2011 1904   NITRITE NEGATIVE 05/12/2021 1720   LEUKOCYTESUR NEGATIVE 05/12/2021 1720   Sepsis Labs: '@LABRCNTIP'$ (procalcitonin:4,lacticidven:4) ) Recent Results (from the past 240 hour(s))  Resp Panel by RT-PCR (Flu A&B, Covid) Nasopharyngeal Swab     Status: None   Collection Time: 05/12/21  2:48 PM   Specimen: Nasopharyngeal Swab; Nasopharyngeal(NP) swabs in vial transport medium  Result Value Ref Range Status   SARS Coronavirus 2 by RT PCR NEGATIVE NEGATIVE Final    Comment: (NOTE) SARS-CoV-2 target nucleic acids are NOT DETECTED.  The SARS-CoV-2  RNA is generally detectable in upper respiratory specimens during the acute phase of infection. The lowest concentration of SARS-CoV-2 viral copies this assay can detect is 138 copies/mL. A negative result does not preclude SARS-Cov-2 infection and should not be used as the sole basis for treatment or other patient management decisions. A negative result may occur with  improper specimen collection/handling, submission of specimen other than nasopharyngeal swab, presence of viral mutation(s) within the areas targeted by this assay, and inadequate number of viral copies(<138 copies/mL). A negative result must be combined with clinical observations, patient history, and epidemiological information. The expected result is Negative.  Fact Sheet for Patients:  EntrepreneurPulse.com.au  Fact Sheet for Healthcare Providers:  IncredibleEmployment.be  This test is no t yet approved or cleared by the [place] and  has been authorized for detection and/or diagnosis of SARS-CoV-2 by FDA under an Emergency Use Authorization (EUA). This EUA will remain  in effect (meaning this test can be used) for the duration of the COVID-19 declaration under Section 564(b)(1) of the Act, 21 U.S.C.section 360bbb-3(b)(1), unless the authorization is terminated  or revoked sooner.       Influenza A by PCR NEGATIVE NEGATIVE Final   Influenza B by PCR NEGATIVE NEGATIVE Final    Comment: (NOTE) The Xpert Xpress SARS-CoV-2/FLU/RSV plus assay is intended as an aid in the diagnosis of influenza from Nasopharyngeal swab specimens and should not be used as a sole basis for treatment. Nasal washings and aspirates are unacceptable for Xpert Xpress SARS-CoV-2/FLU/RSV testing.  Fact Sheet for Patients: EntrepreneurPulse.com.au  Fact Sheet for Healthcare Providers: IncredibleEmployment.be  This test is not yet approved or cleared by the  [place] FDA and has been authorized for detection and/or diagnosis of SARS-CoV-2 by FDA under an Emergency Use Authorization (EUA). This EUA will remain in effect (meaning this test can be used) for the duration of the COVID-19 declaration under Section 564(b)(1) of the Act, 21 U.S.C. section 360bbb-3(b)(1), unless the authorization is terminated or revoked.  Performed at [facility], [address], [place],  [address]      Radiological Exams on Admission: CT Abdomen Pelvis Wo Contrast  Result Date: 05/12/2021 CLINICAL DATA:  Acute abdominal pain. Altered mental status and drowsiness. Weakness EXAM: CT ABDOMEN AND PELVIS WITHOUT CONTRAST TECHNIQUE: Multidetector CT imaging of the abdomen and pelvis was performed following the standard protocol without IV contrast. COMPARISON:  CT 11/20/2020 FINDINGS: Lower chest: Borderline cardiomegaly with trace pericardial effusion. Previous pleural effusions have resolved. Suggestion of mild ground-glass opacity at the lung bases, not well assessed due to motion. Small amount of high-density material in the distal esophagus. Hepatobiliary: No focal liver abnormality is seen. No gallstones, gallbladder wall thickening, or biliary dilatation. Pancreas: Mild parenchymal atrophy. No ductal dilatation or inflammation. Spleen: Normal in size without focal abnormality. Splenule anteriorly Adrenals/Urinary Tract: Normal adrenal glands. No hydronephrosis or renal calculi. No perinephric edema. 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 Decompressed ureters. Partially distended urinary bladder. Stomach/Bowel: Small amount of high-density material in the distal stomach. Stomach decompressed. No small bowel obstruction or obvious inflammation. Normal appendix. There is a large volume of stool distending the rectum, rectal distention of 6.4 cm. Associated rectal wall thickening and perirectal fat  stranding. No focal fluid collection or pneumatosis. Moderate stool in the sigmoid, with small volume of stool in the more proximal colon. Vascular/Lymphatic: Aortic atherosclerosis without aneurysm. No bulky abdominopelvic adenopathy. Reproductive: Normal for age uterine atrophy.  No adnexal mass. Other: Perirectal fat stranding and inflammation. Trace presacral inflammation and edema. No other ascites. No free air or focal fluid collection. No abdominal wall hernia. Musculoskeletal: There are no acute or suspicious osseous abnormalities. IMPRESSION: 1. Large volume of stool distending the rectum, rectal distention of 6.4 cm with associated rectal wall thickening and perirectal fat stranding. Findings consistent with fecal impaction and stercoral colitis. No perforation or abscess. 2. Small amount of high-density material in the distal esophagus, can be seen with reflux or slow transit. 3. Suggestion of mild ground-glass opacity at the lung bases, not well assessed due to motion, but possibly pulmonary edema. Aortic Atherosclerosis (ICD10-I70.0). Electronically Signed   By: Keith Rake M.D.   On: 05/12/2021 16:13   DG Chest [facility] 1 View  Result  Date: 05/12/2021 CLINICAL DATA:  Fever and weakness. EXAM: PORTABLE CHEST 1 VIEW COMPARISON:  11/22/2020 FINDINGS: Improved lung volumes from prior exam. Cardiomegaly is unchanged. Stable mediastinal contours. Previous patchy airspace opacities have resolved in the interim. There is mild bronchial thickening. Question of mild septal thickening at the bases. No significant pleural effusion. No pneumothorax. Stable osseous structures. IMPRESSION: Bronchial thickening with possible mild septal thickening at the bases, pulmonary edema versus atypical infection. Cardiomegaly is unchanged. Electronically Signed   By: Keith Rake M.D.   On: 05/12/2021 15:58    EKG: Independently reviewed. ***  Assessment/Plan Principal Problem:   Adrenal insufficiency  (HCC) Active Problems:   CKD (chronic kidney disease) stage 4, GFR 15-29 ml/min (HCC)   Hypokalemia   Fecal impaction (HCC)   Anemia due to chronic kidney disease   Adrenal insufficiency (Addison's disease) ([place])    1. ***   DVT prophylaxis: *** Code Status: ***  Family Communication: ***  Disposition Plan: ***  Consults called: ***  Admission status: ***    Rise Patience MD [facility] Pager [phone].  If 7PM-7AM, please contact night-coverage [email or web address] Password [facility]  05/12/2021, 11:50 PM

## 2021-05-13 ENCOUNTER — Observation Stay (HOSPITAL_COMMUNITY): Payer: Medicaid Other

## 2021-05-13 DIAGNOSIS — N184 Chronic kidney disease, stage 4 (severe): Secondary | ICD-10-CM

## 2021-05-13 DIAGNOSIS — E876 Hypokalemia: Secondary | ICD-10-CM

## 2021-05-13 DIAGNOSIS — K5289 Other specified noninfective gastroenteritis and colitis: Secondary | ICD-10-CM

## 2021-05-13 DIAGNOSIS — D631 Anemia in chronic kidney disease: Secondary | ICD-10-CM

## 2021-05-13 DIAGNOSIS — K5641 Fecal impaction: Secondary | ICD-10-CM

## 2021-05-13 DIAGNOSIS — R42 Dizziness and giddiness: Secondary | ICD-10-CM

## 2021-05-13 LAB — BASIC METABOLIC PANEL
Anion gap: 8 (ref 5–15)
Anion gap: 9 (ref 5–15)
BUN: 29 mg/dL — ABNORMAL HIGH (ref 8–23)
BUN: 30 mg/dL — ABNORMAL HIGH (ref 8–23)
CO2: 17 mmol/L — ABNORMAL LOW (ref 22–32)
CO2: 18 mmol/L — ABNORMAL LOW (ref 22–32)
Calcium: 8.5 mg/dL — ABNORMAL LOW (ref 8.9–10.3)
Calcium: 8.7 mg/dL — ABNORMAL LOW (ref 8.9–10.3)
Chloride: 107 mmol/L (ref 98–111)
Chloride: 107 mmol/L (ref 98–111)
Creatinine, Ser: 2.61 mg/dL — ABNORMAL HIGH (ref 0.44–1.00)
Creatinine, Ser: 2.57 mg/dL — ABNORMAL HIGH (ref 0.44–1.00)
GFR, Estimated: 20 mL/min — ABNORMAL LOW (ref >60)
GFR, Estimated: 21 mL/min — ABNORMAL LOW (ref >60)
Glucose, Bld: 185 mg/dL — ABNORMAL HIGH (ref 70–99)
Glucose, Bld: 180 mg/dL — ABNORMAL HIGH (ref 70–99)
Potassium: 5.2 mmol/L — ABNORMAL HIGH (ref 3.5–5.1)
Potassium: 4.8 mmol/L (ref 3.5–5.1)
Sodium: 132 mmol/L — ABNORMAL LOW (ref 135–145)
Sodium: 134 mmol/L — ABNORMAL LOW (ref 135–145)

## 2021-05-13 LAB — CBC
HCT: 23.5 % — ABNORMAL LOW (ref 36.0–46.0)
HCT: 24 % — ABNORMAL LOW (ref 36.0–46.0)
Hemoglobin: 7.7 g/dL — ABNORMAL LOW (ref 12.0–15.0)
Hemoglobin: 7.7 g/dL — ABNORMAL LOW (ref 12.0–15.0)
MCH: 28.8 pg (ref 26.0–34.0)
MCH: 28.2 pg (ref 26.0–34.0)
MCHC: 32.8 g/dL (ref 30.0–36.0)
MCHC: 32.1 g/dL (ref 30.0–36.0)
MCV: 88 fL (ref 80.0–100.0)
MCV: 87.9 fL (ref 80.0–100.0)
Platelets: 185 10*3/uL (ref 150–400)
Platelets: 171 10*3/uL (ref 150–400)
RBC: 2.67 MIL/uL — ABNORMAL LOW (ref 3.87–5.11)
RBC: 2.73 MIL/uL — ABNORMAL LOW (ref 3.87–5.11)
RDW: 13.9 % (ref 11.5–15.5)
RDW: 13.9 % (ref 11.5–15.5)
WBC: 12.2 10*3/uL — ABNORMAL HIGH (ref 4.0–10.5)
WBC: 10.6 10*3/uL — ABNORMAL HIGH (ref 4.0–10.5)
nRBC: 0 % (ref 0.0–0.2)
nRBC: 0 % (ref 0.0–0.2)

## 2021-05-13 LAB — GLUCOSE, CAPILLARY
Glucose-Capillary: 121 mg/dL — ABNORMAL HIGH (ref 70–99)
Glucose-Capillary: 197 mg/dL — ABNORMAL HIGH (ref 70–99)
Glucose-Capillary: 183 mg/dL — ABNORMAL HIGH (ref 70–99)
Glucose-Capillary: 106 mg/dL — ABNORMAL HIGH (ref 70–99)
Glucose-Capillary: 112 mg/dL — ABNORMAL HIGH (ref 70–99)

## 2021-05-13 LAB — MAGNESIUM: Magnesium: 2 mg/dL (ref 1.7–2.4)

## 2021-05-13 LAB — TSH: TSH: 0.989 u[IU]/mL (ref 0.350–4.500)

## 2021-05-13 LAB — FERRITIN: Ferritin: 117 ng/mL (ref 11–307)

## 2021-05-13 LAB — VITAMIN B12: Vitamin B-12: 269 pg/mL (ref 180–914)

## 2021-05-13 LAB — FOLATE: Folate: 27 ng/mL (ref >5.9)

## 2021-05-13 LAB — IRON AND TIBC
Iron: 16 ug/dL — ABNORMAL LOW (ref 28–170)
Saturation Ratios: 6 % — ABNORMAL LOW (ref 10.4–31.8)
TIBC: 246 ug/dL — ABNORMAL LOW (ref 250–450)
UIBC: 230 ug/dL

## 2021-05-13 LAB — CORTISOL: Cortisol, Plasma: 55.1 ug/dL

## 2021-05-13 MED ORDER — HYDRALAZINE HCL 20 MG/ML IJ SOLN: 10.0000 mg | INTRAMUSCULAR | Status: DC | PRN

## 2021-05-13 MED ORDER — SENNOSIDES-DOCUSATE SODIUM 8.6-50 MG PO TABS
1.0000 | ORAL_TABLET | Freq: Two times a day (BID) | ORAL | Status: DC
  Administered 2021-05-13 – 2021-05-14 (×3): 1 via ORAL
  Filled 2021-05-13 (×3): qty 1

## 2021-05-13 MED ORDER — SODIUM CHLORIDE 0.9 % IV SOLN: 25.0000 mg | Freq: Once | INTRAVENOUS | Status: DC

## 2021-05-13 MED ORDER — CYANOCOBALAMIN 1000 MCG/ML IJ SOLN
1000.0000 ug | Freq: Every day | INTRAMUSCULAR | Status: DC
  Administered 2021-05-13 – 2021-05-16 (×4): 1000 ug via INTRAMUSCULAR
  Filled 2021-05-13 (×3): qty 1

## 2021-05-13 MED ORDER — SODIUM CHLORIDE 0.9 % IV SOLN
250.0000 mg | Freq: Every day | INTRAVENOUS | Status: DC
  Filled 2021-05-13: qty 20

## 2021-05-13 MED ORDER — MAGNESIUM CITRATE PO SOLN
1.0000 | Freq: Once | ORAL | Status: DC
  Filled 2021-05-13: qty 296

## 2021-05-13 MED ORDER — ACETAMINOPHEN 325 MG PO TABS
650.0000 mg | ORAL_TABLET | Freq: Four times a day (QID) | ORAL | Status: DC | PRN
  Administered 2021-05-13 – 2021-05-15 (×7): 650 mg via ORAL
  Filled 2021-05-13 (×8): qty 2

## 2021-05-13 MED ORDER — SODIUM CHLORIDE 0.9 % IV SOLN: 1000.0000 mg | Freq: Once | INTRAVENOUS | Status: DC

## 2021-05-13 MED ORDER — POLYETHYLENE GLYCOL 3350 17 G PO PACK
17.0000 g | PACK | Freq: Every day | ORAL | Status: DC
  Administered 2021-05-14: 17 g via ORAL
  Filled 2021-05-13: qty 1

## 2021-05-13 MED ORDER — SODIUM CHLORIDE 0.9 % IV SOLN
510.0000 mg | Freq: Once | INTRAVENOUS | Status: AC
  Administered 2021-05-13: 510 mg via INTRAVENOUS
  Filled 2021-05-13: qty 17

## 2021-05-13 NOTE — Evaluation (Signed)
Physical Therapy Evaluation Patient Details Name: [name] MRN: [id number] DOB: 08-28-1961 Today's Date: 05/13/2021   History of Present Illness  60 y.o. female was brought to the ER 05/12/21 with complaints of having dizziness headache and nausea vomiting since yesterday morning. +hypokalemia, CT abdomen showed large stool burden required disimpaction; CXR with cardiomegaly and pulmonary edema; CT head Persistent prominent ventricles with otherwise no acute intracranial  abnormality.  PMH- hypertension, diabetes mellitus type 2, chronic kidney disease stage IV, chronic anemia, hypothyroidism and previous history of adrenal insufficiency  Clinical Impression   Pt admitted secondary to problem above with deficits below. PTA patient was independent until she became very dizzy with headache and N/V. Pt currently denies any dizziness.  Pt currently requires minguard assist for ambulation (slight drifting rt and lt noted during initial 25 ft of ambulation).  Family will provide 24/7 care, including feeling safe assisting pt with gait if needed (they were using 2 family members to help her walk PTA due to dizziness). Will continue to follow acutely to maximize functional mobility independence and safety.       Follow Up Recommendations No PT follow up;Supervision/Assistance - 24 hour    Equipment Recommendations  None recommended by PT    Recommendations for Other Services OT consult     Precautions / Restrictions Precautions Precautions: Fall Precaution Comments: near falls PTA due to dizziness      Mobility  Bed Mobility Overal bed mobility: Needs Assistance Bed Mobility: Rolling;Supine to Sit Rolling: Independent   Supine to sit: Supervision     General bed mobility comments: close supervision for safety due to recent dizziness and slightly impulsive behaviour    Transfers Overall transfer level: Needs assistance Equipment used: 1 person hand held assist Transfers: Sit  to/from Stand Sit to Stand: Min guard         General transfer comment: from bed and toilet x 2  Ambulation/Gait Ambulation/Gait assistance: Min guard Gait Distance (Feet): 25 Feet (x2) Assistive device: None Gait Pattern/deviations: Step-through pattern;Drifts right/left Gait velocity: diminished   General Gait Details: pt groggy/awakening on arrival; intially drifted as walking to the bathroom; once more awake did not have drift from bathroom to chair  Stairs            Wheelchair Mobility    Modified Rankin (Stroke Patients Only)       Balance Overall balance assessment: Mild deficits observed, not formally tested                                           Pertinent Vitals/Pain Pain Assessment: No/denies pain    Home Living Family/patient expects to be discharged to:: Private residence Living Arrangements: Children Available Help at Discharge: Family;Available 24 hours/day Type of Home: House         Home Equipment: None      Prior Function Level of Independence: Independent         Comments: just prior to admission required +2 assist to walk due to dizziness     Hand Dominance        Extremity/Trunk Assessment   Upper Extremity Assessment Upper Extremity Assessment: Generalized weakness    Lower Extremity Assessment Lower Extremity Assessment: Generalized weakness    Cervical / Trunk Assessment Cervical / Trunk Assessment: Normal  Communication   Communication: No difficulties  Cognition Arousal/Alertness: Awake/alert Behavior During Therapy: Impulsive Overall Cognitive Status:  Impaired/Different from baseline                                 General Comments: pt unaware she had been incontinent of BM in the bed; daughter reports this is unlike her      General Comments General comments (skin integrity, edema, etc.): Daughter arrived mid-session and confirmed 24/7 assist on discharge     Exercises     Assessment/Plan    PT Assessment Patient needs continued PT services  PT Problem List Decreased balance;Decreased mobility;Decreased cognition;Decreased safety awareness       PT Treatment Interventions DME instruction;Gait training;Stair training;Functional mobility training;Therapeutic activities;Therapeutic exercise;Balance training;Cognitive remediation;Patient/family education    PT Goals (Current goals can be found in the Care Plan section)  Acute Rehab PT Goals Patient Stated Goal: regain strength with walking PT Goal Formulation: With patient Time For Goal Achievement: 05/20/21 Potential to Achieve Goals: Good    Frequency Min 3X/week   Barriers to discharge        Co-evaluation               AM-PAC PT "6 Clicks" Mobility  Outcome Measure Help needed turning from your back to your side while in a flat bed without using bedrails?: A Little Help needed moving from lying on your back to sitting on the side of a flat bed without using bedrails?: A Little Help needed moving to and from a bed to a chair (including a wheelchair)?: A Little Help needed standing up from a chair using your arms (e.g., wheelchair or bedside chair)?: A Little Help needed to walk in hospital room?: A Little Help needed climbing 3-5 steps with a railing? : A Little 6 Click Score: 18    End of Session Equipment Utilized During Treatment: Gait belt Activity Tolerance: Patient tolerated treatment well Patient left: in chair;with call bell/phone within reach;with chair alarm set;with family/visitor present Nurse Communication: Mobility status PT Visit Diagnosis: Unsteadiness on feet (R26.81);Dizziness and giddiness (R42)    Time: [id number] PT Time Calculation (min) (ACUTE ONLY): 20 min   Charges:   PT Evaluation $PT Eval Low Complexity: 1 Low           Arby Barrette, PT Pager [phone]   Rexanne Mano 05/13/2021, 12:29 PM

## 2021-05-13 NOTE — H&P (Signed)
History and Physical    [name] [id number] DOB: 01-13-1961 DOA: 05/12/2021  PCP: Ladell Pier, MD  Patient coming from: Home.  Chief Complaint: Dizziness and nausea vomiting.  HPI: [name] is a 60 y.o. female with history of hypertension, diabetes mellitus type 2, chronic kidney disease stage IV, chronic anemia, hypothyroidism and previous history of adrenal insufficiency was brought to the ER with complaints of having dizziness headache and nausea vomiting since yesterday morning.  Patient's headache is mostly right frontal.  Patient states yesterday he threw up 3 times.  No diarrhea.  Patient also was found to be confused when patient is to ER.  ED Course: In the ER patient had CT abdomen pelvis which showed large stool burden and stercoral colitis for which patient had stool disimpaction done.  Patient's labs were significant for potassium of 2.7.  Patient hemoglobin and creatinine were at baseline.  Patient was given potassium replacement and also 1 dose of hydrocortisone stress dose given history of adrenal insufficiency.  Patient admitted for further work-up.  Chest x-ray shows cardiomegaly with possible pulm edema.  COVID test was negative.  Review of Systems: As per HPI, rest all negative.   Past Medical History:  Diagnosis Date  . Diabetes mellitus without complication ([place])   . Hypertension   . Thyroid disease     Past Surgical History:  Procedure Laterality Date  . CESAREAN SECTION       reports that she has never smoked. She has never used smokeless tobacco. She reports that she does not drink alcohol and does not use drugs.  Allergies  Allergen Reactions  . Percocet [Oxycodone-Acetaminophen] Rash    Family History  Problem Relation Age of Onset  . Diabetes Mother   . Hyperlipidemia Father   . Hypertension Father   . Adrenal disorder Neg Hx     Prior to Admission medications   Medication Sig Start Date End Date Taking? Authorizing  Provider  acetaminophen (TYLENOL) 325 MG tablet Take 2 tablets (650 mg total) by mouth every 6 (six) hours as needed for mild pain. 03/31/18   Dixie Dials, MD  amLODipine (NORVASC) 10 MG tablet TAKE 1 TABLET (10 MG TOTAL) BY MOUTH DAILY. 04/08/21 04/08/22  Ladell Pier, MD  aspirin 81 MG chewable tablet Chew 81 mg by mouth daily.    [provider]  atorvastatin (LIPITOR) 20 MG tablet TAKE 1 TABLET (20 MG TOTAL) BY MOUTH DAILY. 12/02/20 12/02/21  Charlott Rakes, MD  Calcium Carbonate-Vit D-Min (CALCIUM 1200 PO) Take 1,200 mg by mouth daily.    [provider]  chlorthalidone (HYGROTON) 25 MG tablet TAKE 1/2 TABLET BY MOUTH ONCE A DAY 02/03/21 02/03/22  Justin Mend, MD  fluticasone (FLONASE) 50 MCG/ACT nasal spray PLACE 2 SPRAYS INTO BOTH NOSTRILS DAILY. 10/29/20 10/29/21  Charlott Rakes, MD  hydrALAZINE (APRESOLINE) 100 MG tablet Take 1 tablet (100 mg total) by mouth 3 (three) times daily. 03/30/21   Ladell Pier, MD  isosorbide mononitrate (IMDUR) 60 MG 24 hr tablet TAKE 1 TABLET BY MOUTH DAILY 03/04/21 03/04/22  Justin Mend, MD  levothyroxine (SYNTHROID) 150 MCG tablet TAKE 1 TABLET (150 MCG TOTAL) BY MOUTH DAILY BEFORE BREAKFAST. 10/29/20 10/29/21  Charlott Rakes, MD  metoprolol succinate (TOPROL-XL) 25 MG 24 hr tablet Take 1 tablet (25 mg total) by mouth daily. 03/30/21   Ladell Pier, MD  Multiple Vitamin (MULTIVITAMIN WITH MINERALS) TABS tablet Take 1 tablet by mouth daily.    [provider]  omeprazole (PRILOSEC) 40 MG capsule TAKE 1 CAPSULE (40 MG TOTAL) BY MOUTH DAILY. 05/06/21 05/06/22  Charlott Rakes, MD  polyethylene glycol (MIRALAX / GLYCOLAX) 17 g packet Take 17 g by mouth daily as needed.    [provider]  pravastatin (PRAVACHOL) 20 MG tablet TAKE 1 TABLET BY MOUTH DAILY 02/03/21 02/03/22  Justin Mend, MD  repaglinide (PRANDIN) 0.5 MG tablet TAKE 1 TABLET (0.5 MG TOTAL) BY MOUTH 2 (TWO) TIMES DAILY BEFORE A MEAL.  02/15/21 02/15/22  Renato Shin, MD    Physical Exam: Constitutional: Moderately built and nourished. Vitals:   05/12/21 2015 05/12/21 2030 05/12/21 2227 05/12/21 2301  BP: (!) 121/37 (!) 140/49 (!) 134/41 (!) 121/39  Pulse: 67 74 79 82  Resp: '18 19 18 20  '$ Temp:   98.4 F (36.9 C) 97.9 F (36.6 C)  TempSrc:   Oral Oral  SpO2: 99% 96% 97% 97%  Weight:      Height:       Eyes: Anicteric no pallor. ENMT: No discharge from the ears eyes nose or mouth. Neck: No mass felt.  No neck rigidity. Respiratory: No rhonchi or crepitations. Cardiovascular: S1-S2 heard. Abdomen: Soft nontender bowel sound present. Musculoskeletal: No edema. Skin: No rash. Neurologic: Alert awake oriented to time place and person.  Moves all extremities. Psychiatric: Appears normal.  Normal affect.   Labs on Admission: I have personally reviewed following labs and imaging studies  CBC: Recent Labs  Lab 05/12/21 1346 05/13/21 0035 05/13/21 0246  WBC 10.7* 12.2* 10.6*  HGB 7.9* 7.7* 7.7*  HCT 23.8* 23.5* 24.0*  MCV 85.9 88.0 87.9  PLT 205 185 [id number]   Basic Metabolic Panel: Recent Labs  Lab 05/12/21 1346 05/13/21 0035 05/13/21 0246  NA 135 132* 134*  K 2.7* 5.2* 4.8  CL 103 107 107  CO2 20* 17* 18*  GLUCOSE 176* 185* 180*  BUN 27* 29* 30*  CREATININE 2.49* 2.61* 2.57*  CALCIUM 9.0 8.5* 8.7*  MG  --  2.0  --    GFR: Estimated Creatinine Clearance: 18.6 mL/min (A) (by C-G formula based on SCr of 2.57 mg/dL (H)). Liver Function Tests: Recent Labs  Lab 05/12/21 1346  AST 28  ALT 14  ALKPHOS 103  BILITOT 0.9  PROT 7.4  ALBUMIN 4.1   Recent Labs  Lab 05/12/21 1346  LIPASE 33   Recent Labs  Lab 05/12/21 1448  AMMONIA 34   Coagulation Profile: No results for input(s): INR, PROTIME in the last 168 hours. Cardiac Enzymes: No results for input(s): CKTOTAL, CKMB, CKMBINDEX, TROPONINI in the last 168 hours. BNP (last 3 results) No results for input(s): PROBNP in the last 8760  hours. HbA1C: No results for input(s): HGBA1C in the last 72 hours. CBG: Recent Labs  Lab 05/12/21 1326 05/12/21 2214  GLUCAP 179* 176*   Lipid Profile: No results for input(s): CHOL, HDL, LDLCALC, TRIG, CHOLHDL, LDLDIRECT in the last 72 hours. Thyroid Function Tests: No results for input(s): TSH, T4TOTAL, FREET4, T3FREE, THYROIDAB in the last 72 hours. Anemia Panel: No results for input(s): VITAMINB12, FOLATE, FERRITIN, TIBC, IRON, RETICCTPCT in the last 72 hours. Urine analysis:    Component Value Date/Time   COLORURINE YELLOW 05/12/2021 1720   APPEARANCEUR CLEAR 05/12/2021 1720   LABSPEC 1.015 05/12/2021 1720   PHURINE 5.0 05/12/2021 1720   GLUCOSEU NEGATIVE 05/12/2021 1720   HGBUR NEGATIVE 05/12/2021 1720   BILIRUBINUR NEGATIVE 05/12/2021 [place] 05/12/2021 1720   PROTEINUR 30 (A)  05/12/2021 1720   UROBILINOGEN 0.2 06/12/2011 1904   NITRITE NEGATIVE 05/12/2021 1720   LEUKOCYTESUR NEGATIVE 05/12/2021 1720   Sepsis Labs: '@LABRCNTIP'$ (procalcitonin:4,lacticidven:4) ) Recent Results (from the past 240 hour(s))  Resp Panel by RT-PCR (Flu A&B, Covid) Nasopharyngeal Swab     Status: None   Collection Time: 05/12/21  2:48 PM   Specimen: Nasopharyngeal Swab; Nasopharyngeal(NP) swabs in vial transport medium  Result Value Ref Range Status   SARS Coronavirus 2 by RT PCR NEGATIVE NEGATIVE Final    Comment: (NOTE) SARS-CoV-2 target nucleic acids are NOT DETECTED.  The SARS-CoV-2 RNA is generally detectable in upper respiratory specimens during the acute phase of infection. The lowest concentration of SARS-CoV-2 viral copies this assay can detect is 138 copies/mL. A negative result does not preclude SARS-Cov-2 infection and should not be used as the sole basis for treatment or other patient management decisions. A negative result may occur with  improper specimen collection/handling, submission of specimen other than nasopharyngeal swab, presence of viral  mutation(s) within the areas targeted by this assay, and inadequate number of viral copies(<138 copies/mL). A negative result must be combined with clinical observations, patient history, and epidemiological information. The expected result is Negative.  Fact Sheet for Patients:  EntrepreneurPulse.com.au  Fact Sheet for Healthcare Providers:  IncredibleEmployment.be  This test is no t yet approved or cleared by the [place] FDA and  has been authorized for detection and/or diagnosis of SARS-CoV-2 by FDA under an Emergency Use Authorization (EUA). This EUA will remain  in effect (meaning this test can be used) for the duration of the COVID-19 declaration under Section 564(b)(1) of the Act, 21 U.S.C.section 360bbb-3(b)(1), unless the authorization is terminated  or revoked sooner.       Influenza A by PCR NEGATIVE NEGATIVE Final   Influenza B by PCR NEGATIVE NEGATIVE Final    Comment: (NOTE) The Xpert Xpress SARS-CoV-2/FLU/RSV plus assay is intended as an aid in the diagnosis of influenza from Nasopharyngeal swab specimens and should not be used as a sole basis for treatment. Nasal washings and aspirates are unacceptable for Xpert Xpress SARS-CoV-2/FLU/RSV testing.  Fact Sheet for Patients: EntrepreneurPulse.com.au  Fact Sheet for Healthcare Providers: IncredibleEmployment.be  This test is not yet approved or cleared by the [place] FDA and has been authorized for detection and/or diagnosis of SARS-CoV-2 by FDA under an Emergency Use Authorization (EUA). This EUA will remain in effect (meaning this test can be used) for the duration of the COVID-19 declaration under Section 564(b)(1) of the Act, 21 U.S.C. section 360bbb-3(b)(1), unless the authorization is terminated or revoked.  Performed at [facility], [address], [place], [place] [address]      Radiological  Exams on Admission: CT Abdomen Pelvis Wo Contrast  Result Date: 05/12/2021 CLINICAL DATA:  Acute abdominal pain. Altered mental status and drowsiness. Weakness EXAM: CT ABDOMEN AND PELVIS WITHOUT CONTRAST TECHNIQUE: Multidetector CT imaging of the abdomen and pelvis was performed following the standard protocol without IV contrast. COMPARISON:  CT 11/20/2020 FINDINGS: Lower chest: Borderline cardiomegaly with trace pericardial effusion. Previous pleural effusions have resolved. Suggestion of mild ground-glass opacity at the lung bases, not well assessed due to motion. Small amount of high-density material in the distal esophagus. Hepatobiliary: No focal liver abnormality is seen. No gallstones, gallbladder wall thickening, or biliary dilatation. Pancreas: Mild parenchymal atrophy. No ductal dilatation or inflammation. Spleen: Normal in size without focal abnormality. Splenule anteriorly Adrenals/Urinary Tract: Normal adrenal glands. No hydronephrosis or renal calculi. No perinephric  edema. 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 Decompressed ureters. Partially distended urinary bladder. Stomach/Bowel: Small amount of high-density material in the distal stomach. Stomach decompressed. No small bowel obstruction or obvious inflammation. Normal appendix. There is a large volume of stool distending the rectum, rectal distention of 6.4 cm. Associated rectal wall thickening and perirectal fat stranding. No focal fluid collection or pneumatosis. Moderate stool in the sigmoid, with small volume of stool in the more proximal colon. Vascular/Lymphatic: Aortic atherosclerosis without aneurysm. No bulky abdominopelvic adenopathy. Reproductive: Normal for age uterine atrophy.  No adnexal mass. Other: Perirectal fat stranding and inflammation. Trace presacral inflammation and edema. No other ascites. No free air or focal fluid collection. No abdominal wall hernia. Musculoskeletal:  There are no acute or suspicious osseous abnormalities. IMPRESSION: 1. Large volume of stool distending the rectum, rectal distention of 6.4 cm with associated rectal wall thickening and perirectal fat stranding. Findings consistent with fecal impaction and stercoral colitis. No perforation or abscess. 2. Small amount of high-density material in the distal esophagus, can be seen with reflux or slow transit. 3. Suggestion of mild ground-glass opacity at the lung bases, not well assessed due to motion, but possibly pulmonary edema. Aortic Atherosclerosis (ICD10-I70.0). Electronically Signed   By: Keith Rake M.D.   On: 05/12/2021 16:13   CT HEAD WO CONTRAST  Result Date: 05/13/2021 CLINICAL DATA:  Right-sided headache. EXAM: CT HEAD WITHOUT CONTRAST TECHNIQUE: Contiguous axial images were obtained from the base of the skull through the vertex without intravenous contrast. COMPARISON:  CT head 11/20/2020 FINDINGS: Brain: No evidence of large-territorial acute infarction. No parenchymal hemorrhage. No mass lesion. No extra-axial collection. No mass effect or midline shift. Redemonstration of prominent ventricles. Basilar cisterns are patent. Vascular: No hyperdense vessel. Skull: No acute fracture or focal lesion. Sinuses/Orbits: Paranasal sinuses and mastoid air cells are clear. The orbits are unremarkable. Other: None. IMPRESSION: Persistent prominent ventricles with otherwise no acute intracranial abnormality. Electronically Signed   By: Iven Finn M.D.   On: 05/13/2021 01:25   DG Chest Port 1 View  Result Date: 05/12/2021 CLINICAL DATA:  Fever and weakness. EXAM: PORTABLE CHEST 1 VIEW COMPARISON:  11/22/2020 FINDINGS: Improved lung volumes from prior exam. Cardiomegaly is unchanged. Stable mediastinal contours. Previous patchy airspace opacities have resolved in the interim. There is mild bronchial thickening. Question of mild septal thickening at the bases. No significant pleural effusion. No  pneumothorax. Stable osseous structures. IMPRESSION: Bronchial thickening with possible mild septal thickening at the bases, pulmonary edema versus atypical infection. Cardiomegaly is unchanged. Electronically Signed   By: Keith Rake M.D.   On: 05/12/2021 15:58    EKG: Independently reviewed.  Normal sinus rhythm.  Assessment/Plan Principal Problem:   Dizziness Active Problems:   CKD (chronic kidney disease) stage 4, GFR 15-29 ml/min (HCC)   Hypokalemia   Fecal impaction (HCC)   Anemia due to chronic kidney disease    1. Dizziness headache with nausea vomiting we will get a CT head.  At this time the patient appears nonfocal.  We will get physical therapy consult. 2. Nausea vomiting cause not clear abdomen appears benign patient did have large stool burden with for which stools were disimpacted in the ER.  We will continue to monitor.  Follow CT head. 3. Hypokalemia could be from nausea vomiting and patient also using hydrochlorothiazide.  Replace recheck. 4. Diabetes mellitus type 2 we will keep patient on sliding scale coverage. 5. Stool impaction was  disimpacted in the ER. 6. Anemia secondary renal disease follow CBC hemoglobin appears to be at baseline. 7. Hypertension we will hold hydrochlorothiazide due to hypokalemia at this time.  Continue metoprolol and Imdur amlodipine hydralazine and as needed IV hydralazine. 8. History of adrenal insufficiency -patient had followed up with endocrinologist and February 2022 when patient's Solu-Cortef was discontinued and was advised not to start.  Will need to follow-up with endocrinologist.  Will check cortisol level. 9. Hypothyroidism on Synthroid.  Check TSH with next blood draw.   DVT prophylaxis: Heparin. Code Status: Full code. Family Communication: Discussed with patient. Disposition Plan: Home. Consults called: Physical therapy. Admission status: Observation.   Rise Patience MD [facility] Pager [phone].  If 7PM-7AM, please contact night-coverage [email or web address] Password [facility]  05/13/2021, 5:07 AM

## 2021-05-13 NOTE — Plan of Care (Signed)
  Problem: Education: Goal: Knowledge of General Education information will improve Description: Including pain rating scale, medication(s)/side effects and non-pharmacologic comfort measures Reactivated   Problem: Health Behavior/Discharge Planning: Goal: Ability to manage health-related needs will improve Reactivated   Problem: Clinical Measurements: Goal: Ability to maintain clinical measurements within normal limits will improve Reactivated Goal: Will remain free from infection Reactivated Goal: Diagnostic test results will improve Reactivated Goal: Respiratory complications will improve Reactivated Goal: Cardiovascular complication will be avoided Reactivated   Problem: Activity: Goal: Risk for activity intolerance will decrease Reactivated   Problem: Nutrition: Goal: Adequate nutrition will be maintained Reactivated   Problem: Coping: Goal: Level of anxiety will decrease Reactivated   Problem: Elimination: Goal: Will not experience complications related to bowel motility Reactivated Goal: Will not experience complications related to urinary retention Reactivated   Problem: Pain Managment: Goal: General experience of comfort will improve Reactivated   Problem: Safety: Goal: Ability to remain free from injury will improve Reactivated   Problem: Skin Integrity: Goal: Risk for impaired skin integrity will decrease Reactivated

## 2021-05-13 NOTE — Progress Notes (Signed)
PT Cancellation Note  Patient Details Name: [name] MRN: [id number] DOB: [birth year]   Cancelled Treatment:    Reason Eval/Treat Not Completed: Fatigue/lethargy limiting ability to participate  Pt sleeping soundly and unable to keep eyes open. Will reassess later this morning.    Arby Barrette, PT Pager [phone]   Rexanne Mano 05/13/2021, 9:14 AM

## 2021-05-13 NOTE — Progress Notes (Signed)
Pt has an allergy banner to tylenol/oxycodone in chart. Pt states that she takes tylenol PM at night to sleep and for pain. Discussed w/ Dr Hal Hope concerning pts leg pain. Told pt we will monitor her for signs/symptoms of tylenol allergy.

## 2021-05-13 NOTE — Progress Notes (Signed)
OT Cancellation Note  Patient Details Name: Lucile Coch MRN: [id number] DOB: Oct 01, 1961   Cancelled Treatment:    Reason Eval/Treat Not Completed: Fatigue/lethargy limiting ability to participate. Patient asleep in bed upon entry. Not easily aroused. Will check back as time allows.   Gloris Manchester OTR/L Supplemental OT, Department of rehab services [phone]  Lindy Garczynski R H. 05/13/2021, 2:13 PM

## 2021-05-13 NOTE — Progress Notes (Signed)
Progress Note    [name]   [id number]  DOB: 10-24-1961  DOA: 05/12/2021     1  PCP: Ladell Pier, MD  CC: dizziness, HA, N/V  Hospital Course: [name] is a 60 y.o. female with history of hypertension, diabetes mellitus type 2, chronic kidney disease stage IV, chronic anemia, hypothyroidism and previous history of adrenal insufficiency was brought to the ER with complaints of having dizziness headache and nausea vomiting since yesterday morning. Patient's headache is mostly right frontal.  Patient states yesterday he threw up 3 times.  No diarrhea. Patient also was found to be confused when patient is to ER.  In the ER patient had CT abdomen pelvis which showed large stool burden and stercoral colitis for which patient had stool disimpaction done.  Patient's labs were significant for potassium of 2.7.  Patient hemoglobin and creatinine were at baseline.  Patient was given potassium replacement and also 1 dose of hydrocortisone stress dose given history of adrenal insufficiency.   Patient admitted for further work-up.  Chest x-ray shows cardiomegaly with possible pulm edema.  COVID test was negative.   Interval History:  Patient seen in room this afternoon with her daughter bedside.  She was eating lunch and doing relatively okay.  Still complaining of some weakness/lethargy.  With further discussion, she does endorse being constipated at home but seems to underreport the amount of constipation.  We discussed being on a more aggressive bowel regimen.  ROS: Constitutional: positive for fatigue and malaise, Respiratory: negative for cough, Cardiovascular: negative for chest pain and Gastrointestinal: negative for abdominal pain  Assessment & Plan:  Dizziness, N/V - no obvious etiology; possibly related to underlying severe constipation - continue supportive care - no PT needs at discharge -CT head and MRI brain do show ventriculomegaly however similar to findings  seen in 2021 but progressed from 2006  Hypokalemia -Possibly multifactorial from HCTZ and nausea/vomiting  Type 2 diabetes - Continue SSI and CBG monitoring  Severe constipation Stercoral colitis - Patient does endorse chronic constipation at home and only takes oral laxative - Start on scheduled MiraLAX and Senokot-S - Magnesium citrate x1 also  Normocytic anemia - Patient takes oral iron tablet at home - Etiology likely in setting of underlying CKD - Check iron studies and folate, B12  Hypertension - HCTZ on hold - Continue metoprolol, Imdur, amlodipine, hydralazine  History of adrenal insufficiency - Follows with endocrinology.  Patient was taken off of hydrocortisone in February 2022 and has a 22-monthfollow-up with endocrinology anticipated - Cortisol level 55  Hypothyroidism - TSH 0.98 9 - Continue Synthroid    Old records reviewed in assessment of this patient  Antimicrobials: n/a  DVT prophylaxis: heparin injection 5,000 Units Start: 05/13/21 1400   Code Status:   Code Status: Full Code Family Communication: daughter  Disposition Plan: Status is: Observation  The patient will require care spanning > 2 midnights and should be moved to inpatient because: Inpatient level of care appropriate due to severity of illness  Dispo: The patient is from: Home              Anticipated d/c is to: Home              Patient currently is not medically stable to d/c.   Difficult to place patient No   Risk of unplanned readmission score:     Objective: Blood pressure (!) 123/45, pulse 77, temperature 98.4 F (36.9 C), temperature source Oral, resp. rate 16, height  $'5\' 5"'C$  (1.651 m), weight 51.2 kg, SpO2 95 %.  Examination: General appearance: cooperative, fatigued and no distress Head: Normocephalic, without obvious abnormality, atraumatic Eyes: EOMI Lungs: clear to auscultation bilaterally Heart: regular rate and rhythm and S1, S2 normal Abdomen: normal findings:  bowel sounds normal and soft, non-tender Extremities: no edema Skin: mobility and turgor normal Neurologic: Grossly normal  Consultants:   n/a  Procedures:   n/a  Data Reviewed: I have personally reviewed following labs and imaging studies Results for orders placed or performed during the hospital encounter of 05/12/21 (from the past 24 hour(s))  Blood culture (routine x 2)     Status: None (Preliminary result)   Collection Time: 05/12/21  4:30 PM   Specimen: BLOOD  Result Value Ref Range   Specimen Description      BLOOD Performed at [facility], [address] [place], [place] [address]    Special Requests      Blood Culture adequate volume BOTTLES DRAWN AEROBIC AND ANAEROBIC LEFT ANTECUBITAL Performed at [facility], [address] [place], [place] [address]    Culture      NO GROWTH < 12 HOURS Performed at [facility], [place] [address], [place], [place] [address]    Report Status PENDING   Blood culture (routine x 2)     Status: None (Preliminary result)   Collection Time: 05/12/21  5:00 PM   Specimen: BLOOD  Result Value Ref Range   Specimen Description      BLOOD BLOOD LEFT FOREARM Performed at [facility], [address] [place], [place] [address]    Special Requests      BOTTLES DRAWN AEROBIC AND ANAEROBIC Blood Culture adequate volume Performed at [facility], [address] [place], [address] [address]    Culture      NO GROWTH < 12 HOURS Performed at [facility] [address] [place], [place] [address]    Report Status PENDING   Urinalysis, Routine w reflex microscopic Urine, Catheterized     Status: Abnormal   Collection Time: 05/12/21  5:20 PM  Result Value Ref Range   Color, Urine YELLOW YELLOW   APPearance CLEAR CLEAR   Specific Gravity, Urine 1.015 1.005 - 1.030   pH 5.0 5.0 - 8.0   Glucose, UA NEGATIVE NEGATIVE mg/dL   Hgb urine dipstick  NEGATIVE NEGATIVE   Bilirubin Urine NEGATIVE NEGATIVE   Ketones, ur NEGATIVE NEGATIVE mg/dL   Protein, ur 30 (A) NEGATIVE mg/dL   Nitrite NEGATIVE NEGATIVE   Leukocytes,Ua NEGATIVE NEGATIVE   RBC / HPF 0-5 0 - 5 RBC/hpf   WBC, UA 0-5 0 - 5 WBC/hpf   Mucus PRESENT    Hyaline Casts, UA PRESENT    Granular Casts, UA PRESENT   CBG monitoring, ED     Status: Abnormal   Collection Time: 05/12/21 10:14 PM  Result Value Ref Range   Glucose-Capillary 176 (H) 70 - 99 mg/dL  CBC     Status: Abnormal   Collection Time: 05/13/21 12:35 AM  Result Value Ref Range   WBC 12.2 (H) 4.0 - 10.5 K/uL   RBC 2.67 (L) 3.87 - 5.11 MIL/uL   Hemoglobin 7.7 (L) 12.0 - 15.0 g/dL   HCT 23.5 (L) 36.0 - 46.0 %   MCV 88.0 80.0 - 100.0 fL   MCH 28.8 26.0 - 34.0 pg   MCHC 32.8 30.0 - 36.0 g/dL   RDW 13.9 11.5 - 15.5 %  Platelets 185 150 - 400 K/uL   nRBC 0.0 0.0 - 0.2 %  Basic metabolic panel     Status: Abnormal   Collection Time: 05/13/21 12:35 AM  Result Value Ref Range   Sodium 132 (L) 135 - 145 mmol/L   Potassium 5.2 (H) 3.5 - 5.1 mmol/L   Chloride 107 98 - 111 mmol/L   CO2 17 (L) 22 - 32 mmol/L   Glucose, Bld 185 (H) 70 - 99 mg/dL   BUN 29 (H) 8 - 23 mg/dL   Creatinine, Ser 2.61 (H) 0.44 - 1.00 mg/dL   Calcium 8.5 (L) 8.9 - 10.3 mg/dL   GFR, Estimated 20 (L) >60 mL/min   Anion gap 8 5 - 15  Magnesium     Status: None   Collection Time: 05/13/21 12:35 AM  Result Value Ref Range   Magnesium 2.0 1.7 - 2.4 mg/dL  Cortisol     Status: None   Collection Time: 05/13/21 12:52 AM  Result Value Ref Range   Cortisol, Plasma 55.1 ug/dL  TSH     Status: None   Collection Time: 05/13/21 12:52 AM  Result Value Ref Range   TSH 0.989 0.350 - 4.500 uIU/mL  Ferritin     Status: None   Collection Time: 05/13/21 12:52 AM  Result Value Ref Range   Ferritin 117 11 - 307 ng/mL  Folate     Status: None   Collection Time: 05/13/21 12:52 AM  Result Value Ref Range   Folate 27.0 >5.9 ng/mL  Iron and TIBC      Status: Abnormal   Collection Time: 05/13/21 12:52 AM  Result Value Ref Range   Iron 16 (L) 28 - 170 ug/dL   TIBC 246 (L) 250 - 450 ug/dL   Saturation Ratios 6 (L) 10.4 - 31.8 %   UIBC 230 ug/dL  Vitamin B12     Status: None   Collection Time: 05/13/21 12:52 AM  Result Value Ref Range   Vitamin B-12 269 180 - 914 pg/mL  Basic metabolic panel     Status: Abnormal   Collection Time: 05/13/21  2:46 AM  Result Value Ref Range   Sodium 134 (L) 135 - 145 mmol/L   Potassium 4.8 3.5 - 5.1 mmol/L   Chloride 107 98 - 111 mmol/L   CO2 18 (L) 22 - 32 mmol/L   Glucose, Bld 180 (H) 70 - 99 mg/dL   BUN 30 (H) 8 - 23 mg/dL   Creatinine, Ser 2.57 (H) 0.44 - 1.00 mg/dL   Calcium 8.7 (L) 8.9 - 10.3 mg/dL   GFR, Estimated 21 (L) >60 mL/min   Anion gap 9 5 - 15  CBC     Status: Abnormal   Collection Time: 05/13/21  2:46 AM  Result Value Ref Range   WBC 10.6 (H) 4.0 - 10.5 K/uL   RBC 2.73 (L) 3.87 - 5.11 MIL/uL   Hemoglobin 7.7 (L) 12.0 - 15.0 g/dL   HCT 24.0 (L) 36.0 - 46.0 %   MCV 87.9 80.0 - 100.0 fL   MCH 28.2 26.0 - 34.0 pg   MCHC 32.1 30.0 - 36.0 g/dL   RDW 13.9 11.5 - 15.5 %   Platelets 171 150 - 400 K/uL   nRBC 0.0 0.0 - 0.2 %  Glucose, capillary     Status: Abnormal   Collection Time: 05/13/21  7:37 AM  Result Value Ref Range   Glucose-Capillary 121 (H) 70 - 99 mg/dL  Glucose, capillary  Status: Abnormal   Collection Time: 05/13/21 11:42 AM  Result Value Ref Range   Glucose-Capillary 197 (H) 70 - 99 mg/dL    Recent Results (from the past 240 hour(s))  Resp Panel by RT-PCR (Flu A&B, Covid) Nasopharyngeal Swab     Status: None   Collection Time: 05/12/21  2:48 PM   Specimen: Nasopharyngeal Swab; Nasopharyngeal(NP) swabs in vial transport medium  Result Value Ref Range Status   SARS Coronavirus 2 by RT PCR NEGATIVE NEGATIVE Final    Comment: (NOTE) SARS-CoV-2 target nucleic acids are NOT DETECTED.  The SARS-CoV-2 RNA is generally detectable in upper respiratory specimens  during the acute phase of infection. The lowest concentration of SARS-CoV-2 viral copies this assay can detect is 138 copies/mL. A negative result does not preclude SARS-Cov-2 infection and should not be used as the sole basis for treatment or other patient management decisions. A negative result may occur with  improper specimen collection/handling, submission of specimen other than nasopharyngeal swab, presence of viral mutation(s) within the areas targeted by this assay, and inadequate number of viral copies(<138 copies/mL). A negative result must be combined with clinical observations, patient history, and epidemiological information. The expected result is Negative.  Fact Sheet for Patients:  EntrepreneurPulse.com.au  Fact Sheet for Healthcare Providers:  IncredibleEmployment.be  This test is no t yet approved or cleared by the [place] FDA and  has been authorized for detection and/or diagnosis of SARS-CoV-2 by FDA under an Emergency Use Authorization (EUA). This EUA will remain  in effect (meaning this test can be used) for the duration of the COVID-19 declaration under Section 564(b)(1) of the Act, 21 U.S.C.section 360bbb-3(b)(1), unless the authorization is terminated  or revoked sooner.       Influenza A by PCR NEGATIVE NEGATIVE Final   Influenza B by PCR NEGATIVE NEGATIVE Final    Comment: (NOTE) The Xpert Xpress SARS-CoV-2/FLU/RSV plus assay is intended as an aid in the diagnosis of influenza from Nasopharyngeal swab specimens and should not be used as a sole basis for treatment. Nasal washings and aspirates are unacceptable for Xpert Xpress SARS-CoV-2/FLU/RSV testing.  Fact Sheet for Patients: EntrepreneurPulse.com.au  Fact Sheet for Healthcare Providers: IncredibleEmployment.be  This test is not yet approved or cleared by the [place] FDA and has been authorized for detection  and/or diagnosis of SARS-CoV-2 by FDA under an Emergency Use Authorization (EUA). This EUA will remain in effect (meaning this test can be used) for the duration of the COVID-19 declaration under Section 564(b)(1) of the Act, 21 U.S.C. section 360bbb-3(b)(1), unless the authorization is terminated or revoked.  Performed at [facility], [address], [place], [place] [address]   Blood culture (routine x 2)     Status: None (Preliminary result)   Collection Time: 05/12/21  4:30 PM   Specimen: BLOOD  Result Value Ref Range Status   Specimen Description   Final    BLOOD Performed at [facility], [address], [place], [place] [address]    Special Requests   Final    Blood Culture adequate volume BOTTLES DRAWN AEROBIC AND ANAEROBIC LEFT ANTECUBITAL Performed at [facility], [address], [place], [place] [address]    Culture   Final    NO GROWTH < 12 HOURS Performed at [facility], [place] [address], [place], [place] [address]    Report Status PENDING  Incomplete  Blood culture (routine x 2)     Status: None (Preliminary result)  Collection Time: 05/12/21  5:00 PM   Specimen: BLOOD  Result Value Ref Range Status   Specimen Description   Final    BLOOD BLOOD LEFT FOREARM Performed at [facility], [address] [place], [place] [address]    Special Requests   Final    BOTTLES DRAWN AEROBIC AND ANAEROBIC Blood Culture adequate volume Performed at [facility], [address] [place], [place] [address]    Culture   Final    NO GROWTH < 12 HOURS Performed at [facility], [place] [address], [place], [place] [address]    Report Status PENDING  Incomplete     Radiology Studies: CT Abdomen Pelvis Wo Contrast  Result Date: 05/12/2021 CLINICAL DATA:  Acute abdominal pain. Altered mental status and drowsiness. Weakness EXAM: CT ABDOMEN AND PELVIS WITHOUT CONTRAST  TECHNIQUE: Multidetector CT imaging of the abdomen and pelvis was performed following the standard protocol without IV contrast. COMPARISON:  CT 11/20/2020 FINDINGS: Lower chest: Borderline cardiomegaly with trace pericardial effusion. Previous pleural effusions have resolved. Suggestion of mild ground-glass opacity at the lung bases, not well assessed due to motion. Small amount of high-density material in the distal esophagus. Hepatobiliary: No focal liver abnormality is seen. No gallstones, gallbladder wall thickening, or biliary dilatation. Pancreas: Mild parenchymal atrophy. No ductal dilatation or inflammation. Spleen: Normal in size without focal abnormality. Splenule anteriorly Adrenals/Urinary Tract: Normal adrenal glands. No hydronephrosis or renal calculi. No perinephric edema. 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 Decompressed ureters. Partially distended urinary bladder. Stomach/Bowel: Small amount of high-density material in the distal stomach. Stomach decompressed. No small bowel obstruction or obvious inflammation. Normal appendix. There is a large volume of stool distending the rectum, rectal distention of 6.4 cm. Associated rectal wall thickening and perirectal fat stranding. No focal fluid collection or pneumatosis. Moderate stool in the sigmoid, with small volume of stool in the more proximal colon. Vascular/Lymphatic: Aortic atherosclerosis without aneurysm. No bulky abdominopelvic adenopathy. Reproductive: Normal for age uterine atrophy.  No adnexal mass. Other: Perirectal fat stranding and inflammation. Trace presacral inflammation and edema. No other ascites. No free air or focal fluid collection. No abdominal wall hernia. Musculoskeletal: There are no acute or suspicious osseous abnormalities. IMPRESSION: 1. Large volume of stool distending the rectum, rectal distention of 6.4 cm with associated rectal wall thickening and perirectal fat stranding.  Findings consistent with fecal impaction and stercoral colitis. No perforation or abscess. 2. Small amount of high-density material in the distal esophagus, can be seen with reflux or slow transit. 3. Suggestion of mild ground-glass opacity at the lung bases, not well assessed due to motion, but possibly pulmonary edema. Aortic Atherosclerosis (ICD10-I70.0). Electronically Signed   By: Keith Rake M.D.   On: 05/12/2021 16:13   CT HEAD WO CONTRAST  Result Date: 05/13/2021 CLINICAL DATA:  Right-sided headache. EXAM: CT HEAD WITHOUT CONTRAST TECHNIQUE: Contiguous axial images were obtained from the base of the skull through the vertex without intravenous contrast. COMPARISON:  CT head 11/20/2020 FINDINGS: Brain: No evidence of large-territorial acute infarction. No parenchymal hemorrhage. No mass lesion. No extra-axial collection. No mass effect or midline shift. Redemonstration of prominent ventricles. Basilar cisterns are patent. Vascular: No hyperdense vessel. Skull: No acute fracture or focal lesion. Sinuses/Orbits: Paranasal sinuses and mastoid air cells are clear. The orbits are unremarkable. Other: None. IMPRESSION: Persistent prominent ventricles with otherwise no acute intracranial abnormality. Electronically Signed   By: Clelia Croft.D.  On: 05/13/2021 01:25   MR BRAIN WO CONTRAST  Result Date: 05/13/2021 CLINICAL DATA:  Dizziness. EXAM: MRI HEAD WITHOUT CONTRAST TECHNIQUE: Multiplanar, multiecho pulse sequences of the brain and surrounding structures were obtained without intravenous contrast. COMPARISON:  Multiple priors. Most recent prior is the CT head from the same day. FINDINGS: Brain: Similar ventriculomegaly, which is out of proportion to the degree of sulcal volume loss. Ventricular dilation is similar to priors dating back to 2021 and progressed from the more remote 2006 prior. No obvious obstructing mass lesion on this noncontrast study. The foramen of Monro and cerebral aquaduct  appear patent. No acute infarct. No extra-axial fluid collection. No acute hemorrhage. No midline shift. Basal cisterns are patent. Normal position of the cerebellar tonsils. Partially empty sella. Vascular: Major arterial flow voids are maintained. Small right transverse sinus. Skull and upper cervical spine: Normal marrow signal. Sinuses/Orbits: Mild pansinus mucosal thickening with small frontal sinuses. Other: Small bilateral mastoid effusions. IMPRESSION: 1. Ventriculomegaly, which is out of proportion to the degree of sulcal volume loss. This finding is similar to priors dating back to 2021 and progressed from the more remote 2006 prior. Consider communicating hydrocephalus (including normal-pressure hydrocephalus). 2. Otherwise, no acute intracranial abnormality.  No acute infarct. 3. Mild pansinus paranasal sinus mucosal thickening and small bilateral mastoid effusions. 4. Partially empty sella. This finding is nonspecific and may be incidental, but can be seen with idiopathic intracranial hypertension in correct clinical setting. Electronically Signed   By: Margaretha Sheffield MD   On: 05/13/2021 07:01   DG Chest Port 1 View  Result Date: 05/12/2021 CLINICAL DATA:  Fever and weakness. EXAM: PORTABLE CHEST 1 VIEW COMPARISON:  11/22/2020 FINDINGS: Improved lung volumes from prior exam. Cardiomegaly is unchanged. Stable mediastinal contours. Previous patchy airspace opacities have resolved in the interim. There is mild bronchial thickening. Question of mild septal thickening at the bases. No significant pleural effusion. No pneumothorax. Stable osseous structures. IMPRESSION: Bronchial thickening with possible mild septal thickening at the bases, pulmonary edema versus atypical infection. Cardiomegaly is unchanged. Electronically Signed   By: Keith Rake M.D.   On: 05/12/2021 15:58   MR BRAIN WO CONTRAST  Final Result    CT HEAD WO CONTRAST  Final Result    CT Abdomen Pelvis Wo Contrast  Final  Result    DG Chest Port 1 View  Final Result      Scheduled Meds: . amLODipine  10 mg Oral Daily  . atorvastatin  20 mg Oral Daily  . heparin  5,000 Units Subcutaneous Q8H  . hydrALAZINE  100 mg Oral Q8H  . insulin aspart  0-9 Units Subcutaneous TID WC  . isosorbide mononitrate  60 mg Oral Daily  . levothyroxine  150 mcg Oral Q0600  . magnesium citrate  1 Bottle Oral Once  . metoprolol succinate  25 mg Oral Daily  . pantoprazole  40 mg Oral Daily  . polyethylene glycol  17 g Oral Daily  . pravastatin  20 mg Oral Daily  . senna-docusate  1 tablet Oral BID   PRN Meds: acetaminophen, hydrALAZINE Continuous Infusions:   LOS: 1 day  Time spent: Greater than 50% of the 35 minute visit was spent in counseling/coordination of care for the patient as laid out in the A&P.   Dwyane Dee, MD [facility] 05/13/2021, 3:34 PM

## 2021-05-14 ENCOUNTER — Encounter (HOSPITAL_COMMUNITY): Payer: Self-pay | Admitting: Internal Medicine

## 2021-05-14 DIAGNOSIS — Z20822 Contact with and (suspected) exposure to covid-19: Secondary | ICD-10-CM | POA: Diagnosis present

## 2021-05-14 DIAGNOSIS — K5641 Fecal impaction: Secondary | ICD-10-CM | POA: Diagnosis present

## 2021-05-14 DIAGNOSIS — R42 Dizziness and giddiness: Secondary | ICD-10-CM | POA: Diagnosis not present

## 2021-05-14 DIAGNOSIS — K5900 Constipation, unspecified: Secondary | ICD-10-CM | POA: Diagnosis not present

## 2021-05-14 DIAGNOSIS — R4182 Altered mental status, unspecified: Secondary | ICD-10-CM | POA: Diagnosis present

## 2021-05-14 DIAGNOSIS — Z833 Family history of diabetes mellitus: Secondary | ICD-10-CM | POA: Diagnosis not present

## 2021-05-14 DIAGNOSIS — Z8249 Family history of ischemic heart disease and other diseases of the circulatory system: Secondary | ICD-10-CM | POA: Diagnosis not present

## 2021-05-14 DIAGNOSIS — D631 Anemia in chronic kidney disease: Secondary | ICD-10-CM

## 2021-05-14 DIAGNOSIS — Z7984 Long term (current) use of oral hypoglycemic drugs: Secondary | ICD-10-CM | POA: Diagnosis not present

## 2021-05-14 DIAGNOSIS — N179 Acute kidney failure, unspecified: Secondary | ICD-10-CM | POA: Diagnosis present

## 2021-05-14 DIAGNOSIS — N184 Chronic kidney disease, stage 4 (severe): Secondary | ICD-10-CM

## 2021-05-14 DIAGNOSIS — Z885 Allergy status to narcotic agent status: Secondary | ICD-10-CM | POA: Diagnosis not present

## 2021-05-14 DIAGNOSIS — Z79890 Hormone replacement therapy (postmenopausal): Secondary | ICD-10-CM | POA: Diagnosis not present

## 2021-05-14 DIAGNOSIS — Z7982 Long term (current) use of aspirin: Secondary | ICD-10-CM | POA: Diagnosis not present

## 2021-05-14 DIAGNOSIS — E271 Primary adrenocortical insufficiency: Secondary | ICD-10-CM | POA: Diagnosis present

## 2021-05-14 DIAGNOSIS — I129 Hypertensive chronic kidney disease with stage 1 through stage 4 chronic kidney disease, or unspecified chronic kidney disease: Secondary | ICD-10-CM | POA: Diagnosis present

## 2021-05-14 DIAGNOSIS — E538 Deficiency of other specified B group vitamins: Secondary | ICD-10-CM | POA: Diagnosis present

## 2021-05-14 DIAGNOSIS — E039 Hypothyroidism, unspecified: Secondary | ICD-10-CM | POA: Diagnosis present

## 2021-05-14 DIAGNOSIS — E1122 Type 2 diabetes mellitus with diabetic chronic kidney disease: Secondary | ICD-10-CM | POA: Diagnosis present

## 2021-05-14 DIAGNOSIS — K5909 Other constipation: Secondary | ICD-10-CM | POA: Diagnosis not present

## 2021-05-14 DIAGNOSIS — D509 Iron deficiency anemia, unspecified: Secondary | ICD-10-CM | POA: Diagnosis present

## 2021-05-14 DIAGNOSIS — Z79899 Other long term (current) drug therapy: Secondary | ICD-10-CM | POA: Diagnosis not present

## 2021-05-14 DIAGNOSIS — E876 Hypokalemia: Secondary | ICD-10-CM | POA: Diagnosis present

## 2021-05-14 DIAGNOSIS — K5289 Other specified noninfective gastroenteritis and colitis: Secondary | ICD-10-CM | POA: Diagnosis present

## 2021-05-14 LAB — BASIC METABOLIC PANEL
Anion gap: 9 (ref 5–15)
BUN: 31 mg/dL — ABNORMAL HIGH (ref 8–23)
CO2: 20 mmol/L — ABNORMAL LOW (ref 22–32)
Calcium: 9 mg/dL (ref 8.9–10.3)
Chloride: 106 mmol/L (ref 98–111)
Creatinine, Ser: 2.6 mg/dL — ABNORMAL HIGH (ref 0.44–1.00)
GFR, Estimated: 20 mL/min — ABNORMAL LOW (ref >60)
Glucose, Bld: 158 mg/dL — ABNORMAL HIGH (ref 70–99)
Potassium: 3.9 mmol/L (ref 3.5–5.1)
Sodium: 135 mmol/L (ref 135–145)

## 2021-05-14 LAB — CBC WITH DIFFERENTIAL/PLATELET
Abs Immature Granulocytes: 0.04 10*3/uL (ref 0.00–0.07)
Basophils Absolute: 0.1 10*3/uL (ref 0.0–0.1)
Basophils Relative: 1 %
Eosinophils Absolute: 0.2 10*3/uL (ref 0.0–0.5)
Eosinophils Relative: 3 %
HCT: 22.7 % — ABNORMAL LOW (ref 36.0–46.0)
Hemoglobin: 7.3 g/dL — ABNORMAL LOW (ref 12.0–15.0)
Immature Granulocytes: 1 %
Lymphocytes Relative: 28 %
Lymphs Abs: 2.2 10*3/uL (ref 0.7–4.0)
MCH: 28.5 pg (ref 26.0–34.0)
MCHC: 32.2 g/dL (ref 30.0–36.0)
MCV: 88.7 fL (ref 80.0–100.0)
Monocytes Absolute: 0.4 10*3/uL (ref 0.1–1.0)
Monocytes Relative: 5 %
Neutro Abs: 5 10*3/uL (ref 1.7–7.7)
Neutrophils Relative %: 62 %
Platelets: 164 10*3/uL (ref 150–400)
RBC: 2.56 MIL/uL — ABNORMAL LOW (ref 3.87–5.11)
RDW: 14.1 % (ref 11.5–15.5)
WBC: 8 10*3/uL (ref 4.0–10.5)
nRBC: 0 % (ref 0.0–0.2)

## 2021-05-14 LAB — MAGNESIUM: Magnesium: 2.6 mg/dL — ABNORMAL HIGH (ref 1.7–2.4)

## 2021-05-14 LAB — GLUCOSE, CAPILLARY
Glucose-Capillary: 91 mg/dL (ref 70–99)
Glucose-Capillary: 242 mg/dL — ABNORMAL HIGH (ref 70–99)
Glucose-Capillary: 239 mg/dL — ABNORMAL HIGH (ref 70–99)
Glucose-Capillary: 125 mg/dL — ABNORMAL HIGH (ref 70–99)

## 2021-05-14 MED ORDER — PEG-KCL-NACL-NASULF-NA ASC-C 100 G PO SOLR: 1.0000 | Freq: Once | ORAL | Status: DC

## 2021-05-14 MED ORDER — PEG-KCL-NACL-NASULF-NA ASC-C 100 G PO SOLR
0.5000 | Freq: Once | ORAL | Status: DC
  Filled 2021-05-14: qty 1

## 2021-05-14 MED ORDER — METOCLOPRAMIDE HCL 5 MG/ML IJ SOLN
10.0000 mg | Freq: Once | INTRAMUSCULAR | Status: AC
  Administered 2021-05-14: 10 mg via INTRAVENOUS
  Filled 2021-05-14: qty 2

## 2021-05-14 MED ORDER — POLYETHYLENE GLYCOL 3350 17 G PO PACK
17.0000 g | PACK | Freq: Once | ORAL | Status: AC
  Administered 2021-05-14: 17 g via ORAL
  Filled 2021-05-14: qty 1

## 2021-05-14 MED ORDER — POLYETHYLENE GLYCOL 3350 17 G PO PACK
17.0000 g | PACK | Freq: Two times a day (BID) | ORAL | Status: DC
  Filled 2021-05-14: qty 1

## 2021-05-14 MED ORDER — METOCLOPRAMIDE HCL 5 MG/ML IJ SOLN: 10.0000 mg | Freq: Once | INTRAMUSCULAR | Status: DC

## 2021-05-14 MED ORDER — DICLOFENAC SODIUM 1 % EX GEL
2.0000 g | Freq: Four times a day (QID) | CUTANEOUS | Status: DC
  Administered 2021-05-14 – 2021-05-16 (×7): 2 g via TOPICAL
  Filled 2021-05-14: qty 100

## 2021-05-14 MED ORDER — BISACODYL 5 MG PO TBEC
20.0000 mg | DELAYED_RELEASE_TABLET | Freq: Once | ORAL | Status: AC
  Administered 2021-05-14: 20 mg via ORAL
  Filled 2021-05-14: qty 4

## 2021-05-14 NOTE — Plan of Care (Signed)

## 2021-05-14 NOTE — Progress Notes (Signed)
Progress Note    Female Masis   [id number]  DOB: Mar 23, 1961  DOA: 05/12/2021     1  PCP: Ladell Pier, MD  CC: dizziness, HA, N/V  Hospital Course: Aryss Dibley is a 60 y.o. female with history of hypertension, diabetes mellitus type 2, chronic kidney disease stage IV, chronic anemia, hypothyroidism and previous history of adrenal insufficiency was brought to the ER with complaints of having dizziness headache and nausea vomiting since yesterday morning. Patient's headache is mostly right frontal.  Patient states yesterday he threw up 3 times.  No diarrhea. Patient also was found to be confused when patient is to ER.  In the ER patient had CT abdomen pelvis which showed large stool burden and stercoral colitis for which patient had stool disimpaction done.  Patient's labs were significant for potassium of 2.7.  Patient hemoglobin and creatinine were at baseline.  Patient was given potassium replacement and also 1 dose of hydrocortisone stress dose given history of adrenal insufficiency.   Patient admitted for further work-up.  Chest x-ray shows cardiomegaly with possible pulm edema.  COVID test was negative.   Interval History:  Patient complaining of bilateral hip pain and was asking for her muscle cream this afternoon. No reported bleeding events overnight.  Hemoglobin has further down trended this morning.  GI was consulted given concern for possible underlying ulcerations from her stercoral colitis.  She also received IV iron yesterday. Tentative plan is for monitoring hemoglobin overnight 1 more night and probable discharge home tomorrow.  Possible need for transfusion tomorrow as well which she understands and agrees to.  ROS: Constitutional: positive for fatigue and malaise, Respiratory: negative for cough, Cardiovascular: negative for chest pain and Gastrointestinal: negative for abdominal pain  Assessment & Plan:  Severe constipation Stercoral colitis -  Patient does endorse chronic constipation at home and only takes oral laxative - Start on scheduled MiraLAX and Senokot-S - continue aggressive laxative regimen at discharge; oral iron likely contributing as well  Normocytic anemia - Patient takes oral iron tablet at home - Etiology likely in setting of underlying CKD - iron stores low and repleted on 05/13/21 -B12, 269.  Start on daily IM injections while hospitalized and will discharge on oral B12 - Folate level adequate -May need further iron supplementation outpatient intermittently - Hemoglobin 7.3 g/dL this morning.  Differential for etiology includes still due to underlying CKD versus possible ulceration from stercoral colitis; GI consult requested for opinion as well (no need for CLN at this time and etiology presumed from CKD and possibly some malabsorption of iron from chronic constipation)  Dizziness, N/V - no obvious etiology; possibly related to underlying severe constipation and/or anemia - continue supportive care - no PT needs at discharge -CT head and MRI brain do show ventriculomegaly however similar to findings seen in 2021 but progressed from 2006  Hypokalemia -Possibly multifactorial from HCTZ and nausea/vomiting  Type 2 diabetes - Continue SSI and CBG monitoring  Hypertension - HCTZ on hold - Continue metoprolol, Imdur, amlodipine, hydralazine  History of adrenal insufficiency - Follows with endocrinology.  Patient was taken off of hydrocortisone in February 2022 and has a 58-monthfollow-up with endocrinology anticipated - Cortisol level 55 -Currently does not warrant needing hydrocortisone.  Hypothyroidism - TSH 0.989 - Continue Synthroid   Old records reviewed in assessment of this patient  Antimicrobials: n/a  DVT prophylaxis: heparin injection 5,000 Units Start: 05/13/21 1400   Code Status:   Code Status: Full Code Family Communication: daughter  Disposition Plan: Status is:  Observation  The patient will require care spanning > 2 midnights and should be moved to inpatient because: Inpatient level of care appropriate due to severity of illness  Dispo: The patient is from: Home              Anticipated d/c is to: Home              Patient currently is not medically stable to d/c.   Difficult to place patient No   Risk of unplanned readmission score:     Objective: Blood pressure (!) 137/48, pulse 71, temperature 98 F (36.7 C), temperature source Oral, resp. rate 16, height '5\' 5"'$  (1.651 m), weight 51.2 kg, SpO2 96 %.  Examination: General appearance: cooperative, fatigued and no distress Head: Normocephalic, without obvious abnormality, atraumatic Eyes: EOMI Lungs: clear to auscultation bilaterally Heart: regular rate and rhythm and S1, S2 normal Abdomen: normal findings: bowel sounds normal and soft, non-tender Extremities: no edema Skin: mobility and turgor normal Neurologic: Grossly normal  Consultants:   GI  Procedures:   n/a  Data Reviewed: I have personally reviewed following labs and imaging studies Results for orders placed or performed during the hospital encounter of 05/12/21 (from the past 24 hour(s))  Glucose, capillary     Status: Abnormal   Collection Time: 05/13/21  3:47 PM  Result Value Ref Range   Glucose-Capillary 183 (H) 70 - 99 mg/dL  Glucose, capillary     Status: Abnormal   Collection Time: 05/13/21  9:01 PM  Result Value Ref Range   Glucose-Capillary 106 (H) 70 - 99 mg/dL  Glucose, capillary     Status: Abnormal   Collection Time: 05/13/21  9:02 PM  Result Value Ref Range   Glucose-Capillary 112 (H) 70 - 99 mg/dL  Basic metabolic panel     Status: Abnormal   Collection Time: 05/14/21  3:27 AM  Result Value Ref Range   Sodium 135 135 - 145 mmol/L   Potassium 3.9 3.5 - 5.1 mmol/L   Chloride 106 98 - 111 mmol/L   CO2 20 (L) 22 - 32 mmol/L   Glucose, Bld 158 (H) 70 - 99 mg/dL   BUN 31 (H) 8 - 23 mg/dL    Creatinine, Ser 2.60 (H) 0.44 - 1.00 mg/dL   Calcium 9.0 8.9 - 10.3 mg/dL   GFR, Estimated 20 (L) >60 mL/min   Anion gap 9 5 - 15  CBC with Differential/Platelet     Status: Abnormal   Collection Time: 05/14/21  3:27 AM  Result Value Ref Range   WBC 8.0 4.0 - 10.5 K/uL   RBC 2.56 (L) 3.87 - 5.11 MIL/uL   Hemoglobin 7.3 (L) 12.0 - 15.0 g/dL   HCT 22.7 (L) 36.0 - 46.0 %   MCV 88.7 80.0 - 100.0 fL   MCH 28.5 26.0 - 34.0 pg   MCHC 32.2 30.0 - 36.0 g/dL   RDW 14.1 11.5 - 15.5 %   Platelets 164 150 - 400 K/uL   nRBC 0.0 0.0 - 0.2 %   Neutrophils Relative % 62 %   Neutro Abs 5.0 1.7 - 7.7 K/uL   Lymphocytes Relative 28 %   Lymphs Abs 2.2 0.7 - 4.0 K/uL   Monocytes Relative 5 %   Monocytes Absolute 0.4 0.1 - 1.0 K/uL   Eosinophils Relative 3 %   Eosinophils Absolute 0.2 0.0 - 0.5 K/uL   Basophils Relative 1 %   Basophils Absolute 0.1 0.0 - 0.1  K/uL   Immature Granulocytes 1 %   Abs Immature Granulocytes 0.04 0.00 - 0.07 K/uL  Magnesium     Status: Abnormal   Collection Time: 05/14/21  3:27 AM  Result Value Ref Range   Magnesium 2.6 (H) 1.7 - 2.4 mg/dL  Glucose, capillary     Status: None   Collection Time: 05/14/21  8:41 AM  Result Value Ref Range   Glucose-Capillary 91 70 - 99 mg/dL  Glucose, capillary     Status: Abnormal   Collection Time: 05/14/21 12:04 PM  Result Value Ref Range   Glucose-Capillary 242 (H) 70 - 99 mg/dL    Recent Results (from the past 240 hour(s))  Resp Panel by RT-PCR (Flu A&B, Covid) Nasopharyngeal Swab     Status: None   Collection Time: 05/12/21  2:48 PM   Specimen: Nasopharyngeal Swab; Nasopharyngeal(NP) swabs in vial transport medium  Result Value Ref Range Status   SARS Coronavirus 2 by RT PCR NEGATIVE NEGATIVE Final    Comment: (NOTE) SARS-CoV-2 target nucleic acids are NOT DETECTED.  The SARS-CoV-2 RNA is generally detectable in upper respiratory specimens during the acute phase of infection. The lowest concentration of SARS-CoV-2 viral  copies this assay can detect is 138 copies/mL. A negative result does not preclude SARS-Cov-2 infection and should not be used as the sole basis for treatment or other patient management decisions. A negative result may occur with  improper specimen collection/handling, submission of specimen other than nasopharyngeal swab, presence of viral mutation(s) within the areas targeted by this assay, and inadequate number of viral copies(<138 copies/mL). A negative result must be combined with clinical observations, patient history, and epidemiological information. The expected result is Negative.  Fact Sheet for Patients:  EntrepreneurPulse.com.au  Fact Sheet for Healthcare Providers:  IncredibleEmployment.be  This test is no t yet approved or cleared by the [place] FDA and  has been authorized for detection and/or diagnosis of SARS-CoV-2 by FDA under an Emergency Use Authorization (EUA). This EUA will remain  in effect (meaning this test can be used) for the duration of the COVID-19 declaration under Section 564(b)(1) of the Act, 21 U.S.C.section 360bbb-3(b)(1), unless the authorization is terminated  or revoked sooner.       Influenza A by PCR NEGATIVE NEGATIVE Final   Influenza B by PCR NEGATIVE NEGATIVE Final    Comment: (NOTE) The Xpert Xpress SARS-CoV-2/FLU/RSV plus assay is intended as an aid in the diagnosis of influenza from Nasopharyngeal swab specimens and should not be used as a sole basis for treatment. Nasal washings and aspirates are unacceptable for Xpert Xpress SARS-CoV-2/FLU/RSV testing.  Fact Sheet for Patients: EntrepreneurPulse.com.au  Fact Sheet for Healthcare Providers: IncredibleEmployment.be  This test is not yet approved or cleared by the [place] FDA and has been authorized for detection and/or diagnosis of SARS-CoV-2 by FDA under an Emergency Use Authorization (EUA). This  EUA will remain in effect (meaning this test can be used) for the duration of the COVID-19 declaration under Section 564(b)(1) of the Act, 21 U.S.C. section 360bbb-3(b)(1), unless the authorization is terminated or revoked.  Performed at [facility], [address] [place], [place] [address]   Blood culture (routine x 2)     Status: None (Preliminary result)   Collection Time: 05/12/21  4:30 PM   Specimen: BLOOD  Result Value Ref Range Status   Specimen Description   Final    BLOOD Performed at [facility], [address] [place], [place] [address]  Special Requests   Final    Blood Culture adequate volume BOTTLES DRAWN AEROBIC AND ANAEROBIC LEFT ANTECUBITAL Performed at [facility], [address], [place], [place] [address]    Culture   Final    NO GROWTH 2 DAYS Performed at [facility], [place] [address] [place], [place] [address]    Report Status PENDING  Incomplete  Blood culture (routine x 2)     Status: None (Preliminary result)   Collection Time: 05/12/21  5:00 PM   Specimen: BLOOD  Result Value Ref Range Status   Specimen Description   Final    BLOOD BLOOD LEFT FOREARM Performed at [facility], [address], [place], [place] [address]    Special Requests   Final    BOTTLES DRAWN AEROBIC AND ANAEROBIC Blood Culture adequate volume Performed at [facility], [address], [place], [place] [address]    Culture   Final    NO GROWTH 2 DAYS Performed at [facility], [place] [address], [place], [place] [address]    Report Status PENDING  Incomplete     Radiology Studies: CT Abdomen Pelvis Wo Contrast  Result Date: 05/12/2021 CLINICAL DATA:  Acute abdominal pain. Altered mental status and drowsiness. Weakness EXAM: CT ABDOMEN AND PELVIS WITHOUT CONTRAST TECHNIQUE: Multidetector CT imaging of the abdomen and pelvis was performed following the  standard protocol without IV contrast. COMPARISON:  CT 11/20/2020 FINDINGS: Lower chest: Borderline cardiomegaly with trace pericardial effusion. Previous pleural effusions have resolved. Suggestion of mild ground-glass opacity at the lung bases, not well assessed due to motion. Small amount of high-density material in the distal esophagus. Hepatobiliary: No focal liver abnormality is seen. No gallstones, gallbladder wall thickening, or biliary dilatation. Pancreas: Mild parenchymal atrophy. No ductal dilatation or inflammation. Spleen: Normal in size without focal abnormality. Splenule anteriorly Adrenals/Urinary Tract: Normal adrenal glands. No hydronephrosis or renal calculi. No perinephric edema. 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 Decompressed ureters. Partially distended urinary bladder. Stomach/Bowel: Small amount of high-density material in the distal stomach. Stomach decompressed. No small bowel obstruction or obvious inflammation. Normal appendix. There is a large volume of stool distending the rectum, rectal distention of 6.4 cm. Associated rectal wall thickening and perirectal fat stranding. No focal fluid collection or pneumatosis. Moderate stool in the sigmoid, with small volume of stool in the more proximal colon. Vascular/Lymphatic: Aortic atherosclerosis without aneurysm. No bulky abdominopelvic adenopathy. Reproductive: Normal for age uterine atrophy.  No adnexal mass. Other: Perirectal fat stranding and inflammation. Trace presacral inflammation and edema. No other ascites. No free air or focal fluid collection. No abdominal wall hernia. Musculoskeletal: There are no acute or suspicious osseous abnormalities. IMPRESSION: 1. Large volume of stool distending the rectum, rectal distention of 6.4 cm with associated rectal wall thickening and perirectal fat stranding. Findings consistent with fecal impaction and stercoral colitis. No perforation or  abscess. 2. Small amount of high-density material in the distal esophagus, can be seen with reflux or slow transit. 3. Suggestion of mild ground-glass opacity at the lung bases, not well assessed due to motion, but possibly pulmonary edema. Aortic Atherosclerosis (ICD10-I70.0). Electronically Signed   By: Keith Rake M.D.   On: 05/12/2021 16:13   CT HEAD WO CONTRAST  Result Date: 05/13/2021 CLINICAL DATA:  Right-sided headache. EXAM: CT HEAD WITHOUT CONTRAST TECHNIQUE: Contiguous axial images were obtained from the base of the skull through the vertex without intravenous contrast. COMPARISON:  CT head 11/20/2020 FINDINGS: Brain: No evidence of large-territorial acute infarction. No parenchymal hemorrhage. No mass lesion. No extra-axial collection. No mass effect or midline shift. Redemonstration of prominent ventricles. Basilar cisterns are patent. Vascular: No hyperdense vessel. Skull: No acute fracture or focal lesion. Sinuses/Orbits: Paranasal sinuses and mastoid air cells are clear. The orbits are unremarkable. Other: None. IMPRESSION: Persistent prominent ventricles with otherwise no acute intracranial abnormality. Electronically Signed   By: Iven Finn M.D.   On: 05/13/2021 01:25   MR BRAIN WO CONTRAST  Result Date: 05/13/2021 CLINICAL DATA:  Dizziness. EXAM: MRI HEAD WITHOUT CONTRAST TECHNIQUE: Multiplanar, multiecho pulse sequences of the brain and surrounding structures were obtained without intravenous contrast. COMPARISON:  Multiple priors. Most recent prior is the CT head from the same day. FINDINGS: Brain: Similar ventriculomegaly, which is out of proportion to the degree of sulcal volume loss. Ventricular dilation is similar to priors dating back to 2021 and progressed from the more remote 2006 prior. No obvious obstructing mass lesion on this noncontrast study. The foramen of Monro and cerebral aquaduct appear patent. No acute infarct. No extra-axial fluid collection. No acute  hemorrhage. No midline shift. Basal cisterns are patent. Normal position of the cerebellar tonsils. Partially empty sella. Vascular: Major arterial flow voids are maintained. Small right transverse sinus. Skull and upper cervical spine: Normal marrow signal. Sinuses/Orbits: Mild pansinus mucosal thickening with small frontal sinuses. Other: Small bilateral mastoid effusions. IMPRESSION: 1. Ventriculomegaly, which is out of proportion to the degree of sulcal volume loss. This finding is similar to priors dating back to 2021 and progressed from the more remote 2006 prior. Consider communicating hydrocephalus (including normal-pressure hydrocephalus). 2. Otherwise, no acute intracranial abnormality.  No acute infarct. 3. Mild pansinus paranasal sinus mucosal thickening and small bilateral mastoid effusions. 4. Partially empty sella. This finding is nonspecific and may be incidental, but can be seen with idiopathic intracranial hypertension in correct clinical setting. Electronically Signed   By: Margaretha Sheffield MD   On: 05/13/2021 07:01   DG Chest Port 1 View  Result Date: 05/12/2021 CLINICAL DATA:  Fever and weakness. EXAM: PORTABLE CHEST 1 VIEW COMPARISON:  11/22/2020 FINDINGS: Improved lung volumes from prior exam. Cardiomegaly is unchanged. Stable mediastinal contours. Previous patchy airspace opacities have resolved in the interim. There is mild bronchial thickening. Question of mild septal thickening at the bases. No significant pleural effusion. No pneumothorax. Stable osseous structures. IMPRESSION: Bronchial thickening with possible mild septal thickening at the bases, pulmonary edema versus atypical infection. Cardiomegaly is unchanged. Electronically Signed   By: Keith Rake M.D.   On: 05/12/2021 15:58   MR BRAIN WO CONTRAST  Final Result    CT HEAD WO CONTRAST  Final Result    CT Abdomen Pelvis Wo Contrast  Final Result    DG Chest Port 1 View  Final Result      Scheduled Meds: .  amLODipine  10 mg Oral Daily  . atorvastatin  20 mg Oral Daily  . cyanocobalamin  1,000 mcg Intramuscular Daily  . diclofenac Sodium  2 g Topical QID  . heparin  5,000 Units Subcutaneous Q8H  . hydrALAZINE  100 mg Oral Q8H  . insulin aspart  0-9 Units Subcutaneous TID WC  . isosorbide mononitrate  60 mg Oral Daily  . levothyroxine  150 mcg Oral Q0600  . metoprolol succinate  25 mg Oral Daily  . pantoprazole  40 mg Oral Daily  . polyethylene glycol  17 g Oral BID  . pravastatin  20 mg Oral Daily   PRN Meds: acetaminophen, hydrALAZINE Continuous Infusions:   LOS: 1 day  Time spent: Greater than 50% of the 35 minute visit was spent in counseling/coordination of care for the patient as laid out in the A&P.   Dwyane Dee, MD [facility] 05/14/2021, 3:10 PM

## 2021-05-14 NOTE — Progress Notes (Signed)
Physical Therapy Treatment Patient Details Name: [name] MRN: [id number] DOB: 05-16-1961 Today's Date: 05/14/2021    History of Present Illness 60 y.o. female was brought to the ER 05/12/21 with complaints of having dizziness, headache and nausea vomiting. +hypokalemia, CT abdomen showed large stool burden required disimpaction; CXR with cardiomegaly and pulmonary edema; CT head negative.  Pt with severe constipation. PMH- HTN, DM, CKD, chronic anemia, hypothyroidism and previous history of adrenal insufficiency    PT Comments    Pt with improving mobility. Today limited by bilateral thigh pain which pt reports she has sometime at home. Expect continued progress toward baseline. Continue to recommend return home with family.  Follow Up Recommendations  No PT follow up;Supervision/Assistance - 24 hour     Equipment Recommendations  None recommended by PT    Recommendations for Other Services       Precautions / Restrictions Precautions Precautions: Fall Precaution Comments: near falls PTA due to dizziness    Mobility  Bed Mobility Overal bed mobility: Modified Independent Bed Mobility: Supine to Sit;Rolling;Sit to Supine Rolling: Independent   Supine to sit: Modified independent (Device/Increase time);HOB elevated Sit to supine: Modified independent (Device/Increase time)        Transfers Overall transfer level: Needs assistance Equipment used: Rolling walker (2 wheeled) Transfers: Sit to/from Stand Sit to Stand: Min guard         General transfer comment: Assist for lines/safety  Ambulation/Gait Ambulation/Gait assistance: Supervision Gait Distance (Feet): 40 Feet Assistive device: Rolling walker (2 wheeled) Gait Pattern/deviations: Step-through pattern;Decreased stride length Gait velocity: decr Gait velocity interpretation: <1.31 ft/sec, indicative of household ambulator General Gait Details: supervision for lines. Used walker due to reports of bil  thigh pain   Stairs             Wheelchair Mobility    Modified Rankin (Stroke Patients Only)       Balance Overall balance assessment: Mild deficits observed, not formally tested                                          Cognition Arousal/Alertness: Awake/alert Behavior During Therapy: WFL for tasks assessed/performed Overall Cognitive Status: Impaired/Different from baseline Area of Impairment: Memory                     Memory: Decreased short-term memory                Exercises      General Comments        Pertinent Vitals/Pain Pain Assessment: Faces Faces Pain Scale: Hurts even more Pain Location: Bilateral thigs Pain Descriptors / Indicators: Grimacing;Guarding Pain Intervention(s): Limited activity within patient's tolerance;Repositioned;Monitored during session    Home Living                      Prior Function            PT Goals (current goals can now be found in the care plan section) Acute Rehab PT Goals Patient Stated Goal: regain strength with walking Progress towards PT goals: Progressing toward goals    Frequency    Min 3X/week      PT Plan Current plan remains appropriate    Co-evaluation              AM-PAC PT "6 Clicks" Mobility   Outcome Measure  Help needed turning from  your back to your side while in a flat bed without using bedrails?: None Help needed moving from lying on your back to sitting on the side of a flat bed without using bedrails?: None Help needed moving to and from a bed to a chair (including a wheelchair)?: A Little Help needed standing up from a chair using your arms (e.g., wheelchair or bedside chair)?: A Little Help needed to walk in hospital room?: A Little Help needed climbing 3-5 steps with a railing? : A Little 6 Click Score: 20    End of Session Equipment Utilized During Treatment: Gait belt Activity Tolerance: Patient limited by pain Patient  left: with call bell/phone within reach;with family/visitor present;in bed;with bed alarm set Nurse Communication: Mobility status PT Visit Diagnosis: Unsteadiness on feet (R26.81);Dizziness and giddiness (R42)     Time: 1123-1140 PT Time Calculation (min) (ACUTE ONLY): 17 min  Charges:  $Gait Training: 8-22 mins                     [place] Pager [phone] Office [place] 05/14/2021, 2:49 PM

## 2021-05-14 NOTE — Consult Note (Addendum)
[facility] Consult: 9:46 AM 05/14/2021  LOS: 1 day    Referring Provider: Dr Sabino Gasser  Primary Care Physician:  Ladell Pier, MD Primary Gastroenterologist:  None, unassigned     Reason for Consultation:  Colitis.     HPI: [name] is a 60 y.o. female.  PMH listed below.  Iron deficiency anemia noted, FOBT negative in early 11/2020.  Transfused 1 PRBC then. Colonoscopy more than 5 years ago in [place].  Patient recalls some sort of problem with the rectum but no polyps. Suffers from constipation which is well controlled/managed with daily MiraLAX which results in daily brown stools.  Has not seen blood per rectum, melena.  No nausea, vomiting.  No abdominal pain.  Appetite generally poor but weight is stable in this thin patient. Evaluation in 11/2020 for abdominal pain.  Noncontrast CTAP early 11/2020 showed mild dilation in loops of small bowel but no obstruction, possible mild enteritis.  Ultrasound early 11/2020 with normal liver, normal biliary tree, normal PV circulation.  Brought to ED 5/24 with dizziness, weakness.  Had brief nonbloody emesis during travel to the ED.  No abdominal pain.  Brown stool earlier that day.  Daughter-in-law was concerned regarding altered mental status and patient sleeping a lot for the last couple of days.  Home-based COVID-19 testing was negative.  Hgb 7.7, was 6.1 then 8.6 after PRBC x 1 in early December 2021.  MCV 85.  WBCs to 12.2. Blood glucose as high as 185.  BUN/creatinine 31/2.6, GFR 20. Iron 16.  Ferritin 117.  Low iron sat at 6%, low TIBC.  B12, Folate normal.   Noncontrast CTAP: Large volume of stool distending the rectum to 6.4 cm with a rectal wall thickening and perirectal fat stranding.  Findings concerning for impaction and stercoral  colitis.  No abscess or perforation.  Small volume material in distal esophagus, ?  Reflux vs slow transit.  Possible pulmonary edema. Although pt not reporting diarrhea, there is a stool pathogen PCR panel pending.  No FOBT results so far.  Patient lives with her son and daughter-in-law in [place].  Originally from [place].  Emigrated to the [place] at age 23.  Previously lived in [place].  Retired Surveyor, minerals.  Does not smoke.  Does not drink alcohol.  No family history of colorectal disease or cancers, anemia.     Past Medical History:  Diagnosis Date  . Adrenal insufficiency, primary ([place])   . Anemia 2018   Hgb 6.8 early 11/2020.  PRBC x 1.  06/2017 PRBC x 1.    Marland Kitchen CHF (congestive heart failure) ([place]) [id number]   Diastolic  . CKD (chronic kidney disease) stage 4, GFR 15-29 ml/min (HCC) 04/2021   Stage 4 as of late May 2022  . Diabetes mellitus without complication ([place])   . Hypertension   . Hypothyroidism     Past Surgical History:  Procedure Laterality Date  . CESAREAN SECTION      Prior to Admission medications   Medication Sig Start Date End Date Taking? Authorizing Provider  acetaminophen (TYLENOL) 325 MG tablet Take 2 tablets (650 mg total) by mouth every 6 (six) hours as needed for mild pain. 03/31/18  Yes Dixie Dials, MD  amLODipine (NORVASC) 10 MG tablet TAKE 1 TABLET (10 MG TOTAL) BY MOUTH DAILY. Patient taking differently: Take 10 mg by mouth daily. 04/08/21 04/08/22 Yes Ladell Pier, MD  aspirin 81 MG chewable tablet Chew 81 mg by mouth daily.   Yes [provider]  atorvastatin (LIPITOR) 20 MG tablet TAKE 1 TABLET (20 MG TOTAL) BY MOUTH DAILY. Patient taking differently: Take 20 mg by mouth daily. 12/02/20 12/02/21 Yes Charlott Rakes, MD  Calcium Carbonate-Vit D-Min (CALCIUM 1200 PO) Take 1,200 mg by mouth daily.   Yes [provider]  chlorthalidone (HYGROTON) 25 MG tablet TAKE 1/2 TABLET BY MOUTH ONCE A DAY Patient taking differently:  Take 12.5 mg by mouth daily. 02/03/21 02/03/22 Yes Justin Mend, MD  hydrALAZINE (APRESOLINE) 100 MG tablet Take 1 tablet (100 mg total) by mouth 3 (three) times daily. 03/30/21  Yes Ladell Pier, MD  isosorbide mononitrate (IMDUR) 60 MG 24 hr tablet TAKE 1 TABLET BY MOUTH DAILY Patient taking differently: Take 60 mg by mouth daily. 03/04/21 03/04/22 Yes Justin Mend, MD  levothyroxine (SYNTHROID) 150 MCG tablet TAKE 1 TABLET (150 MCG TOTAL) BY MOUTH DAILY BEFORE BREAKFAST. Patient taking differently: Take 150 mcg by mouth daily before breakfast. 10/29/20 10/29/21 Yes Charlott Rakes, MD  metoprolol succinate (TOPROL-XL) 25 MG 24 hr tablet Take 1 tablet (25 mg total) by mouth daily. 03/30/21  Yes Ladell Pier, MD  Multiple Vitamin (MULTIVITAMIN WITH MINERALS) TABS tablet Take 1 tablet by mouth daily.   Yes [provider]  omeprazole (PRILOSEC) 40 MG capsule TAKE 1 CAPSULE (40 MG TOTAL) BY MOUTH DAILY. Patient taking differently: Take 40 mg by mouth daily. 05/06/21 05/06/22 Yes Newlin, Charlane Ferretti, MD  pravastatin (PRAVACHOL) 20 MG tablet TAKE 1 TABLET BY MOUTH DAILY Patient taking differently: Take 20 mg by mouth daily. 02/03/21 02/03/22 Yes Justin Mend, MD  repaglinide (PRANDIN) 0.5 MG tablet TAKE 1 TABLET (0.5 MG TOTAL) BY MOUTH 2 (TWO) TIMES DAILY BEFORE A MEAL. Patient taking differently: Take 0.5 mg by mouth 2 (two) times daily before a meal. 02/15/21 02/15/22 Yes Renato Shin, MD  fluticasone (FLONASE) 50 MCG/ACT nasal spray PLACE 2 SPRAYS INTO BOTH NOSTRILS DAILY. Patient not taking: No sig reported 10/29/20 10/29/21  Charlott Rakes, MD    Scheduled Meds: . amLODipine  10 mg Oral Daily  . atorvastatin  20 mg Oral Daily  . cyanocobalamin  1,000 mcg Intramuscular Daily  . heparin  5,000 Units Subcutaneous Q8H  . hydrALAZINE  100 mg Oral Q8H  . insulin aspart  0-9 Units Subcutaneous TID WC  . isosorbide mononitrate  60 mg Oral Daily  . levothyroxine  150 mcg Oral  Q0600  . magnesium citrate  1 Bottle Oral Once  . metoprolol succinate  25 mg Oral Daily  . pantoprazole  40 mg Oral Daily  . polyethylene glycol  17 g Oral Daily  . pravastatin  20 mg Oral Daily  . senna-docusate  1 tablet Oral BID   Infusions:  PRN Meds: acetaminophen, hydrALAZINE   Allergies as of 05/12/2021 - Review Complete 05/12/2021  Allergen Reaction Noted  . Percocet [oxycodone-acetaminophen] Rash 05/10/2017    Family History  Problem Relation Age of Onset  . Diabetes Mother   . Hyperlipidemia Father   . Hypertension Father   . Adrenal disorder Neg  Hx     Social History   Socioeconomic History  . Marital status: Single    Spouse name: Not on file  . Number of children: Not on file  . Years of education: Not on file  . Highest education level: Not on file  Occupational History  . Not on file  Tobacco Use  . Smoking status: Never Smoker  . Smokeless tobacco: Never Used  Substance and Sexual Activity  . Alcohol use: No  . Drug use: No  . Sexual activity: Not on file  Other Topics Concern  . Not on file  Social History Narrative  . Not on file   Social Determinants of Health   Financial Resource Strain: Not on file  Food Insecurity: Not on file  Transportation Needs: Not on file  Physical Activity: Not on file  Stress: Not on file  Social Connections: Not on file  Intimate Partner Violence: Not on file    REVIEW OF SYSTEMS: Constitutional: Weakness. ENT:  No nose bleeds Pulm: Denies shortness of breath and cough. CV:  No palpitations, no LE edema.  No angina GU:  No hematuria, no frequency GI: See HPI. Heme: No unusual or excessive bleeding or bruising. Transfusions: In 2018 and 11/2020. Neuro:  No headaches, no peripheral tingling or numbness Derm:  No itching, no rash or sores.  Endocrine:  No sweats or chills.  No polyuria or dysuria Immunization: Reviewed.  Vaccinated x3 with [employer] COVID-19 vaccine.   PHYSICAL EXAM: Vital signs in  last 24 hours: Vitals:   05/14/21 0338 05/14/21 0550  BP: (!) 138/49 (!) 137/48  Pulse: 71   Resp: 16   Temp:    SpO2:     Wt Readings from Last 3 Encounters:  05/12/21 51.2 kg  03/30/21 51 kg  02/13/21 54.9 kg    General: Thin, frail-appearing, moderately chronically unwell appearance.  Alert and able to provide history.  Comfortable. Head: Facial asymmetry or swelling.  No signs of head trauma. Eyes: No scleral icterus.  No conjunctival pallor.  EOMI. Ears: No hearing deficit Nose: No congestion or discharge Mouth: Oral mucosa is moist, pink, clear.  Tongue midline. Neck: No JVD, no masses, no thyromegaly Lungs: Rales in the bases.  No labored breathing or cough. Heart: RRR.  No MRG.  S1, S2 present. Abdomen: Nondistended, thin.  Palpable firmness, feeling like retained stool in the left and upper mid abdomen.  No tenderness.  Bowel sounds active.  No HSM, masses, bruits..   Rectal: Deferred Musc/Skeltl: No joint redness, swelling or gross deformity. Extremities: No CCE. Neurologic: Response time is a little bit sluggish but accurate.  She is oriented to place, time, person, situation.  Moves all 4 limbs, strength not tested but no gross deficits.  No tremors. Skin: No rash, no sores, no telangiectasia, no significant bruising Nodes: No cervical adenopathy Psych: Calm, cooperative, pleasant.  Fluid speech.  Intake/Output from previous day: 05/25 0701 - 05/26 0700 In: 480 [P.O.:480] Out: 600 [Urine:600] Intake/Output this shift: No intake/output data recorded.  LAB RESULTS: Recent Labs    05/13/21 0035 05/13/21 0246 05/14/21 0327  WBC 12.2* 10.6* 8.0  HGB 7.7* 7.7* 7.3*  HCT 23.5* 24.0* 22.7*  PLT 185 171 164   BMET Lab Results  Component Value Date   NA 135 05/14/2021   NA 134 (L) 05/13/2021   NA 132 (L) 05/13/2021   K 3.9 05/14/2021   K 4.8 05/13/2021   K 5.2 (H) 05/13/2021   CL 106 05/14/2021  CL 107 05/13/2021   CL 107 05/13/2021   CO2 20 (L)  05/14/2021   CO2 18 (L) 05/13/2021   CO2 17 (L) 05/13/2021   GLUCOSE 158 (H) 05/14/2021   GLUCOSE 180 (H) 05/13/2021   GLUCOSE 185 (H) 05/13/2021   BUN 31 (H) 05/14/2021   BUN 30 (H) 05/13/2021   BUN 29 (H) 05/13/2021   CREATININE 2.60 (H) 05/14/2021   CREATININE 2.57 (H) 05/13/2021   CREATININE 2.61 (H) 05/13/2021   CALCIUM 9.0 05/14/2021   CALCIUM 8.7 (L) 05/13/2021   CALCIUM 8.5 (L) 05/13/2021   LFT Recent Labs    05/12/21 1346  PROT 7.4  ALBUMIN 4.1  AST 28  ALT 14  ALKPHOS 103  BILITOT 0.9   PT/INR Lab Results  Component Value Date   INR 1.04 03/27/2018   Hepatitis Panel No results for input(s): HEPBSAG, HCVAB, HEPAIGM, HEPBIGM in the last 72 hours. C-Diff No components found for: CDIFF Lipase     Component Value Date/Time   LIPASE 33 05/12/2021 1346    Drugs of Abuse  No results found for: LABOPIA, COCAINSCRNUR, LABBENZ, AMPHETMU, THCU, LABBARB   RADIOLOGY STUDIES: CT Abdomen Pelvis Wo Contrast  Result Date: 05/12/2021 CLINICAL DATA:  Acute abdominal pain. Altered mental status and drowsiness. Weakness EXAM: CT ABDOMEN AND PELVIS WITHOUT CONTRAST TECHNIQUE: Multidetector CT imaging of the abdomen and pelvis was performed following the standard protocol without IV contrast. COMPARISON:  CT 11/20/2020 FINDINGS: Lower chest: Borderline cardiomegaly with trace pericardial effusion. Previous pleural effusions have resolved. Suggestion of mild ground-glass opacity at the lung bases, not well assessed due to motion. Small amount of high-density material in the distal esophagus. Hepatobiliary: No focal liver abnormality is seen. No gallstones, gallbladder wall thickening, or biliary dilatation. Pancreas: Mild parenchymal atrophy. No ductal dilatation or inflammation. Spleen: Normal in size without focal abnormality. Splenule anteriorly Adrenals/Urinary Tract: Normal adrenal glands. No hydronephrosis or renal calculi. No perinephric edema. 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 Decompressed ureters. Partially distended urinary bladder. Stomach/Bowel: Small amount of high-density material in the distal stomach. Stomach decompressed. No small bowel obstruction or obvious inflammation. Normal appendix. There is a large volume of stool distending the rectum, rectal distention of 6.4 cm. Associated rectal wall thickening and perirectal fat stranding. No focal fluid collection or pneumatosis. Moderate stool in the sigmoid, with small volume of stool in the more proximal colon. Vascular/Lymphatic: Aortic atherosclerosis without aneurysm. No bulky abdominopelvic adenopathy. Reproductive: Normal for age uterine atrophy.  No adnexal mass. Other: Perirectal fat stranding and inflammation. Trace presacral inflammation and edema. No other ascites. No free air or focal fluid collection. No abdominal wall hernia. Musculoskeletal: There are no acute or suspicious osseous abnormalities. IMPRESSION: 1. Large volume of stool distending the rectum, rectal distention of 6.4 cm with associated rectal wall thickening and perirectal fat stranding. Findings consistent with fecal impaction and stercoral colitis. No perforation or abscess. 2. Small amount of high-density material in the distal esophagus, can be seen with reflux or slow transit. 3. Suggestion of mild ground-glass opacity at the lung bases, not well assessed due to motion, but possibly pulmonary edema. Aortic Atherosclerosis (ICD10-I70.0). Electronically Signed   By: Keith Rake M.D.   On: 05/12/2021 16:13   CT HEAD WO CONTRAST  Result Date: 05/13/2021 CLINICAL DATA:  Right-sided headache. EXAM: CT HEAD WITHOUT CONTRAST TECHNIQUE: Contiguous axial images were obtained from the base of the skull through the vertex without intravenous contrast. COMPARISON:  CT head 11/20/2020 FINDINGS: Brain: No evidence of large-territorial acute infarction. No parenchymal hemorrhage. No mass lesion. No  extra-axial collection. No mass effect or midline shift. Redemonstration of prominent ventricles. Basilar cisterns are patent. Vascular: No hyperdense vessel. Skull: No acute fracture or focal lesion. Sinuses/Orbits: Paranasal sinuses and mastoid air cells are clear. The orbits are unremarkable. Other: None. IMPRESSION: Persistent prominent ventricles with otherwise no acute intracranial abnormality. Electronically Signed   By: Iven Finn M.D.   On: 05/13/2021 01:25   MR BRAIN WO CONTRAST  Result Date: 05/13/2021 CLINICAL DATA:  Dizziness. EXAM: MRI HEAD WITHOUT CONTRAST TECHNIQUE: Multiplanar, multiecho pulse sequences of the brain and surrounding structures were obtained without intravenous contrast. COMPARISON:  Multiple priors. Most recent prior is the CT head from the same day. FINDINGS: Brain: Similar ventriculomegaly, which is out of proportion to the degree of sulcal volume loss. Ventricular dilation is similar to priors dating back to 2021 and progressed from the more remote 2006 prior. No obvious obstructing mass lesion on this noncontrast study. The foramen of Monro and cerebral aquaduct appear patent. No acute infarct. No extra-axial fluid collection. No acute hemorrhage. No midline shift. Basal cisterns are patent. Normal position of the cerebellar tonsils. Partially empty sella. Vascular: Major arterial flow voids are maintained. Small right transverse sinus. Skull and upper cervical spine: Normal marrow signal. Sinuses/Orbits: Mild pansinus mucosal thickening with small frontal sinuses. Other: Small bilateral mastoid effusions. IMPRESSION: 1. Ventriculomegaly, which is out of proportion to the degree of sulcal volume loss. This finding is similar to priors dating back to 2021 and progressed from the more remote 2006 prior. Consider communicating hydrocephalus (including normal-pressure hydrocephalus). 2. Otherwise, no acute intracranial abnormality.  No acute infarct. 3. Mild pansinus  paranasal sinus mucosal thickening and small bilateral mastoid effusions. 4. Partially empty sella. This finding is nonspecific and may be incidental, but can be seen with idiopathic intracranial hypertension in correct clinical setting. Electronically Signed   By: Margaretha Sheffield MD   On: 05/13/2021 07:01   DG Chest Port 1 View  Result Date: 05/12/2021 CLINICAL DATA:  Fever and weakness. EXAM: PORTABLE CHEST 1 VIEW COMPARISON:  11/22/2020 FINDINGS: Improved lung volumes from prior exam. Cardiomegaly is unchanged. Stable mediastinal contours. Previous patchy airspace opacities have resolved in the interim. There is mild bronchial thickening. Question of mild septal thickening at the bases. No significant pleural effusion. No pneumothorax. Stable osseous structures. IMPRESSION: Bronchial thickening with possible mild septal thickening at the bases, pulmonary edema versus atypical infection. Cardiomegaly is unchanged. Electronically Signed   By: Keith Rake M.D.   On: 05/12/2021 15:58     IMPRESSION:   *   Obstipation, CT appearance suggesting stercoral colitis.  By patient history she having daily stools so long as she takes MiraLAX. Remote, out-of-state colonoscopy with some sort of rectal findings.  *    Iron deficiency anemia.  In patient with stage 4 CKD.  Does not report being treated with Feraheme or other parenteral iron or CSA (Epogen etc). B12 level normal but B12 injections ordered. Feraheme infusion on 5/25.  *  Hx adrenal insufficiency.  Not on any replacement therapy.  Previously took hydrocortisone daily.  *    hypothyroidism, takes daily Synthroid.  TSH normal yesterday.   PLAN:     *   Colonoscopy.  May need a 2-day prep but will start prep now.  Clear liquids okay. See orders for laxatives and bowel prep.  *    ? Need for  hydrocortisone?    Azucena Freed  05/14/2021, 9:46 AM Phone [phone]  I have reviewed the entire case in detail I also personally  examined the patient just now with her daughter at the bedside.  My additional thoughts are as follows:  Change of plan  This patient presented with obstipation and a resultant proctitis on CT scan.  She does not have overt lower GI bleeding, and I think the overall picture and chronic results of blood work suggest this is anemia of chronic kidney disease.  She has been on oral iron, but most likely not being absorbed well due to the CKD and it is also most likely worsening her obstipation.  She reportedly had disimpaction and is now on a bowel regimen.  I do not feel colonoscopy is necessary at this point.  She does need an aggressive bowel regimen which we will order.  She received some IV iron, and is likely to need more on an ongoing basis as an outpatient, perhaps delivered by nephrology, obtain hematology office consultation as needed.   Nelida Meuse III Office:[phone]

## 2021-05-14 NOTE — TOC Initial Note (Signed)
Transition of Care [facility]) - Initial/Assessment Note    Patient Details  Name: [name] MRN: [id number] Date of Birth: [birth year]  Transition [facility]) CM/SW Contact:    Angelita Ingles, RN Phone Number: [phone]  05/14/2021, 11:23 AM  Clinical Narrative:                 [facility] consulted for patient with high risk for readmission. Patient reports that she is from home where she lives with her son and his wife. Patient states that she was independent prior to hospital admission. Patient does have PCP ( Dr. Aundra Dubin) and she reports that she does follow up regularly. Patient states that she does have appropriate transportation to appointments and access to medications. DME- 3in 1 has been ordered through [facility] and will be delivered to the room. TOC will continue to follow.   Expected Discharge Plan: Home/Self Care Barriers to Discharge: Continued Medical Work up   Patient Goals and CMS Choice Patient states their goals for this hospitalization and ongoing recovery are:: Wants to get rid of the pain   Choice offered to / list presented to : NA  Expected Discharge Plan and Services Expected Discharge Plan: Home/Self Care In-house Referral: NA Discharge Planning Services: CM Consult Post Acute Care Choice: NA Living arrangements for the past 2 months: Single Family Home                 DME Arranged: 3-N-1 DME Agency: [employer] Date DME Agency Contacted: 05/14/21 Time DME Agency Contacted: [id number] Representative spoke with at DME Agency: Freda Munro HH Arranged: NA [place] Agency: NA        Prior Living Arrangements/Services Living arrangements for the past 2 months: [place] with:: Adult Children,Other (Comment) (and daughter in law) Patient language and need for interpreter reviewed:: Yes Do you feel safe going back to the place where you live?: Yes      Need for Family Participation in Patient Care: Yes (Comment) Care giver support system in  place?: Yes (comment)   Criminal Activity/Legal Involvement Pertinent to Current Situation/Hospitalization: No - Comment as needed  Activities of Daily Living Home Assistive Devices/Equipment: CBG Meter ADL Screening (condition at time of admission) Patient's cognitive ability adequate to safely complete daily activities?: Yes Is the patient deaf or have difficulty hearing?: No Does the patient have difficulty seeing, even when wearing glasses/contacts?: No Does the patient have difficulty concentrating, remembering, or making decisions?: Yes (remembering) Patient able to express need for assistance with ADLs?: Yes Does the patient have difficulty dressing or bathing?: No Independently performs ADLs?: Yes (appropriate for developmental age) Does the patient have difficulty walking or climbing stairs?: Yes Weakness of Legs: Both Weakness of Arms/Hands: Both (carpal tunnel)  Permission Sought/Granted   Permission granted to share information with : No              Emotional Assessment Appearance:: Appears stated age Attitude/Demeanor/Rapport: Other (comment) (moaning) Affect (typically observed): Other (comment) (moaning) Orientation: : Oriented to Self,Oriented to Place,Oriented to  Time,Oriented to Situation Alcohol / Substance Use: Not Applicable Psych Involvement: No (comment)  Admission diagnosis:  Hypokalemia [E87.6] Adrenal insufficiency (HCC) [E27.40] Fecal impaction (HCC) [K56.41] Altered mental status, unspecified altered mental status type [R41.82] Adrenal insufficiency (Addison's disease) ([place]) [E27.1] Patient Active Problem List   Diagnosis Date Noted  . Dizziness 05/13/2021  . 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 Hypokalemia 05/12/2021  . Fecal impaction ([place]) 05/12/2021  . Anemia due to chronic kidney disease  05/12/2021  . Adrenal insufficiency (Addison's disease) ([place]) 05/12/2021  . CKD (chronic kidney disease) stage 4, GFR 15-29 ml/min (HCC) 11/21/2020  . HTN  (hypertension) 11/21/2020  . New onset of congestive heart failure ([place]) 11/21/2020  . Adrenal insufficiency ([place]) 03/27/2018  . Dehydration 12/09/2017  . Abdominal pain, bilateral lower quadrant 12/09/2017  . Hypoglycemia 07/08/2017  . Hypoglycemia associated with diabetes ([place]) 05/10/2017  . Malnutrition of moderate degree 05/10/2017   PCP:  Ladell Pier, MD Pharmacy:   [place], [address] [place] [address] Phone: [phone] Fax: [address] [place] [address] Phone: [phone] Fax: [phone]     Social Determinants of Health (SDOH) Interventions    Readmission Risk Interventions Readmission Risk Prevention Plan 05/14/2021  Transportation Screening Complete  PCP or Specialist Appt within 5-7 Days Complete  Home Care Screening Complete  Medication Review (RN CM) Referral to Pharmacy  Some recent data might be hidden

## 2021-05-14 NOTE — Evaluation (Signed)
Occupational Therapy Evaluation Patient Details Name: [name] MRN: [id number] DOB: 05-24-1961 Today's Date: 05/14/2021    History of Present Illness 60 y.o. female was brought to the ER 05/12/21 with complaints of having dizziness headache and nausea vomiting since yesterday morning. +hypokalemia, CT abdomen showed large stool burden required disimpaction; CXR with cardiomegaly and pulmonary edema; CT head Persistent prominent ventricles with otherwise no acute intracranial  abnormality.  PMH- hypertension, diabetes mellitus type 2, chronic kidney disease stage IV, chronic anemia, hypothyroidism and previous history of adrenal insufficiency   Clinical Impression   PTA patient was living with family in a private residence and was grossly I with ADLs/IADLs without AD. Patient reports requiring +2 assist for mobility shortly PTA secondary to dizziness. Patient currently functioning below baseline demonstrating observed ADLs including LB dressing and ADL transfers with Min guard for steadying/safety. Patient also limited by deficits listed below including mild balance deficits and generalized weakness and would benefit from continued acute OT services in prep for safe d/c home with family. OT will continue to follow acutely.     Follow Up Recommendations  No OT follow up;Supervision/Assistance - 24 hour    Equipment Recommendations  3 in 1 bedside commode    Recommendations for Other Services       Precautions / Restrictions Precautions Precautions: Fall Precaution Comments: near falls PTA due to dizziness      Mobility Bed Mobility Overal bed mobility: Needs Assistance Bed Mobility: Rolling;Supine to Sit Rolling: Independent   Supine to sit: Supervision     General bed mobility comments: Supervision A for safety.    Transfers Overall transfer level: Needs assistance Equipment used: 1 person hand held assist Transfers: Sit to/from Stand Sit to Stand: Min guard          General transfer comment: Min guard for safety/steadying.    Balance Overall balance assessment: Mild deficits observed, not formally tested                                         ADL either performed or assessed with clinical judgement   ADL Overall ADL's : Needs assistance/impaired Eating/Feeding: Set up;Sitting   Grooming: Set up;Sitting               Lower Body Dressing: Sit to/from stand;Min guard Lower Body Dressing Details (indicate cue type and reason): Min gaurd for balance/steadying. Toilet Transfer: Magazine features editor Details (indicate cue type and reason): Simulated with transfer to recliner with HHA +1.         Functional mobility during ADLs: Min guard General ADL Comments: Patient greatly limited by generalized debility.     Vision         Perception     Praxis      Pertinent Vitals/Pain Pain Assessment: No/denies pain     Hand Dominance     Extremity/Trunk Assessment Upper Extremity Assessment Upper Extremity Assessment: Generalized weakness   Lower Extremity Assessment Lower Extremity Assessment: Generalized weakness   Cervical / Trunk Assessment Cervical / Trunk Assessment: Normal   Communication Communication Communication: No difficulties   Cognition Arousal/Alertness: Awake/alert Behavior During Therapy: Impulsive Overall Cognitive Status: Impaired/Different from baseline                                 General Comments: Patient following commands appropriately. Able  to recall events leading to this admission.   General Comments       Exercises     Shoulder Instructions      Home Living Family/patient expects to be discharged to:: Private residence Living Arrangements: Children Available Help at Discharge: Family;Available 24 hours/day Type of Home: House                       Home Equipment: None          Prior Functioning/Environment Level of Independence:  Independent        Comments: just prior to admission required +2 assist to walk due to dizziness        OT Problem List: Decreased strength;Decreased activity tolerance;Impaired balance (sitting and/or standing);Decreased knowledge of use of DME or AE      OT Treatment/Interventions: Self-care/ADL training;Therapeutic exercise;DME and/or AE instruction;Therapeutic activities;Patient/family education;Balance training    OT Goals(Current goals can be found in the care plan section) Acute Rehab OT Goals Patient Stated Goal: regain strength with walking OT Goal Formulation: With patient Time For Goal Achievement: 05/28/21 Potential to Achieve Goals: Good ADL Goals Pt/caregiver will Perform Home Exercise Program: Both right and left upper extremity;With written HEP provided;With theraband Additional ADL Goal #1: Patient will complete ADLs with Mod I in prep for safe return home. Additional ADL Goal #2: Patient will recall 3 energy conservation technqiues in prep for ADLs/IADLs.  OT Frequency: Min 2X/week   Barriers to D/C:            Co-evaluation              AM-PAC OT "6 Clicks" Daily Activity     Outcome Measure Help from another person eating meals?: None Help from another person taking care of personal grooming?: A Little Help from another person toileting, which includes using toliet, bedpan, or urinal?: A Little Help from another person bathing (including washing, rinsing, drying)?: A Little Help from another person to put on and taking off regular upper body clothing?: A Little Help from another person to put on and taking off regular lower body clothing?: A Little 6 Click Score: 19   End of Session Equipment Utilized During Treatment: Gait belt;Oxygen Nurse Communication: Mobility status;Other (comment) (Maintained O2 sats on 1L O2 via [place]. On 2L upon entry. Left patient on 1L.)  Activity Tolerance: Patient tolerated treatment well Patient left: in chair;with  call bell/phone within reach;with chair alarm set  OT Visit Diagnosis: Unsteadiness on feet (R26.81);Muscle weakness (generalized) (M62.81);History of falling (Z91.81)                Time: [id number] OT Time Calculation (min): 19 min Charges:  OT General Charges $OT Visit: 1 Visit OT Evaluation $OT Eval Low Complexity: 1 Low  Tanazia Achee H. OTR/L Supplemental OT, Department of rehab services [phone]  Peretz Thieme R H. 05/14/2021, 9:32 AM

## 2021-05-15 ENCOUNTER — Encounter (HOSPITAL_COMMUNITY)
Admission: EM | Disposition: A | Discharge status: Home / Self Care | Payer: Self-pay | Source: Home / Self Care | Attending: Internal Medicine

## 2021-05-15 DIAGNOSIS — K5641 Fecal impaction: Secondary | ICD-10-CM

## 2021-05-15 DIAGNOSIS — K5909 Other constipation: Secondary | ICD-10-CM

## 2021-05-15 DIAGNOSIS — K5289 Other specified noninfective gastroenteritis and colitis: Principal | ICD-10-CM

## 2021-05-15 LAB — CBC WITH DIFFERENTIAL/PLATELET
Abs Immature Granulocytes: 0.05 10*3/uL (ref 0.00–0.07)
Basophils Absolute: 0.1 10*3/uL (ref 0.0–0.1)
Basophils Relative: 1 %
Eosinophils Absolute: 0.3 10*3/uL (ref 0.0–0.5)
Eosinophils Relative: 4 %
HCT: 22.6 % — ABNORMAL LOW (ref 36.0–46.0)
Hemoglobin: 7.3 g/dL — ABNORMAL LOW (ref 12.0–15.0)
Immature Granulocytes: 1 %
Lymphocytes Relative: 27 %
Lymphs Abs: 2.3 10*3/uL (ref 0.7–4.0)
MCH: 28.5 pg (ref 26.0–34.0)
MCHC: 32.3 g/dL (ref 30.0–36.0)
MCV: 88.3 fL (ref 80.0–100.0)
Monocytes Absolute: 0.6 10*3/uL (ref 0.1–1.0)
Monocytes Relative: 7 %
Neutro Abs: 5.2 10*3/uL (ref 1.7–7.7)
Neutrophils Relative %: 60 %
Platelets: 188 10*3/uL (ref 150–400)
RBC: 2.56 MIL/uL — ABNORMAL LOW (ref 3.87–5.11)
RDW: 14.1 % (ref 11.5–15.5)
WBC: 8.5 10*3/uL (ref 4.0–10.5)
nRBC: 0 % (ref 0.0–0.2)

## 2021-05-15 LAB — BASIC METABOLIC PANEL
Anion gap: 10 (ref 5–15)
BUN: 28 mg/dL — ABNORMAL HIGH (ref 8–23)
CO2: 19 mmol/L — ABNORMAL LOW (ref 22–32)
Calcium: 8.7 mg/dL — ABNORMAL LOW (ref 8.9–10.3)
Chloride: 105 mmol/L (ref 98–111)
Creatinine, Ser: 2.62 mg/dL — ABNORMAL HIGH (ref 0.44–1.00)
GFR, Estimated: 20 mL/min — ABNORMAL LOW (ref >60)
Glucose, Bld: 132 mg/dL — ABNORMAL HIGH (ref 70–99)
Potassium: 3.5 mmol/L (ref 3.5–5.1)
Sodium: 134 mmol/L — ABNORMAL LOW (ref 135–145)

## 2021-05-15 LAB — GLUCOSE, CAPILLARY
Glucose-Capillary: 109 mg/dL — ABNORMAL HIGH (ref 70–99)
Glucose-Capillary: 160 mg/dL — ABNORMAL HIGH (ref 70–99)
Glucose-Capillary: 94 mg/dL (ref 70–99)
Glucose-Capillary: 153 mg/dL — ABNORMAL HIGH (ref 70–99)

## 2021-05-15 LAB — MAGNESIUM: Magnesium: 2.3 mg/dL (ref 1.7–2.4)

## 2021-05-15 SURGERY — COLONOSCOPY WITH PROPOFOL
Anesthesia: Monitor Anesthesia Care

## 2021-05-15 MED ORDER — ADULT MULTIVITAMIN W/MINERALS CH
1.0000 | ORAL_TABLET | Freq: Every day | ORAL | Status: DC
  Administered 2021-05-15 – 2021-05-16 (×2): 1 via ORAL
  Filled 2021-05-15 (×2): qty 1

## 2021-05-15 MED ORDER — POLYETHYLENE GLYCOL 3350 17 G PO PACK
17.0000 g | PACK | Freq: Every day | ORAL | Status: DC
  Filled 2021-05-15: qty 1

## 2021-05-15 MED ORDER — GLUCERNA SHAKE PO LIQD
237.0000 mL | Freq: Three times a day (TID) | ORAL | Status: DC
  Administered 2021-05-15 – 2021-05-16 (×2): 237 mL via ORAL

## 2021-05-15 MED ORDER — PROSOURCE PLUS PO LIQD
30.0000 mL | Freq: Two times a day (BID) | ORAL | Status: DC
  Administered 2021-05-16: 30 mL via ORAL
  Filled 2021-05-15: qty 30

## 2021-05-15 NOTE — Progress Notes (Signed)
Initial Nutrition Assessment  DOCUMENTATION CODES:   Not applicable  INTERVENTION:  -Glucerna Shake po TID, each supplement provides 220 kcal and 10 grams of protein -68m Prosource Plus po BID, each supplement provides 100 kcals and 15 grams of protein -MVI with minerals daily  NUTRITION DIAGNOSIS:   Inadequate oral intake related to nausea,vomiting as evidenced by per patient/family report.  GOAL:   Patient will meet greater than or equal to 90% of their needs  MONITOR:   PO intake,Supplement acceptance,Labs,I & O's  REASON FOR ASSESSMENT:   Malnutrition Screening Tool    ASSESSMENT:   Pt admitted with large stool burden (seen on CT) and stercoral colitis; pt had stool disimpaction done. Pt also found to have cardiomegaly and possible pulmonary edema on CXR. PMH includes HTN, type 2 DM, CKD stage IV, chronic anemia, hypothyroidism, and previous h/o adrenal insufficiency.  Pt difficult to obtain history from as she is confused, lethargic, and falls asleep very easily. RN states that pt's family indicated this is the pt's baseline. Per H&P, pt reports having headache, nausea, and vomiting (reports 3 episodes in one day) beginning the day before admission. Pt reported issues with constipation at home, uses oral laxative to relieve symptoms. MD intiated pt on scheduled Miralax and Senokot-S with recommendations to continue aggressive bowel regimen after discharge. Pt now having BMs. Belly soft on exam. Positive bowel sounds. Note GI is following and currently has no plans for inpatient endoscopy. GI in agreement with plan for aggressive bowel regimen. MD notes pt is approaching medical readiness for discharge and anticipates pt will be ready for discharge tomorrow, 5/28. Per MD, pt was too lethargic/confused to confirm readiness for discharge today.   Reviewed weight history; no significant weight changes noted.   No UOP documented x24 hours  Medications: . [START ON 05/16/2021]  (feeding supplement) PROSource Plus  30 mL Oral BID BM  . amLODipine  10 mg Oral Daily  . atorvastatin  20 mg Oral Daily  . cyanocobalamin  1,000 mcg Intramuscular Daily  . diclofenac Sodium  2 g Topical QID  . feeding supplement (GLUCERNA SHAKE)  237 mL Oral TID BM  . heparin  5,000 Units Subcutaneous Q8H  . hydrALAZINE  100 mg Oral Q8H  . insulin aspart  0-9 Units Subcutaneous TID WC  . isosorbide mononitrate  60 mg Oral Daily  . levothyroxine  150 mcg Oral Q0600  . metoprolol succinate  25 mg Oral Daily  . multivitamin with minerals  1 tablet Oral Daily  . pantoprazole  40 mg Oral Daily  . [START ON 05/16/2021] polyethylene glycol  17 g Oral Daily  . pravastatin  20 mg Oral Daily   Labs: Recent Labs  Lab 05/13/21 0035 05/13/21 0246 05/14/21 0327 05/15/21 0158  NA 132* 134* 135 134*  K 5.2* 4.8 3.9 3.5  CL 107 107 106 105  CO2 17* 18* 20* 19*  BUN 29* 30* 31* 28*  CREATININE 2.61* 2.57* 2.60* 2.62*  CALCIUM 8.5* 8.7* 9.0 8.7*  MG 2.0  --  2.6* 2.3  GLUCOSE 185* 180* 158* 132*  CBGs 125-109-160  Diet Order:   Diet Order            Diet Carb Modified Fluid consistency: Thin; Room service appropriate? Yes  Diet effective now                 EDUCATION NEEDS:   No education needs have been identified at this time  Skin:  Skin Assessment: Reviewed  RN Assessment  Last BM:  5/26type 6  Height:   Ht Readings from Last 1 Encounters:  05/12/21 '5\' 5"'$  (1.651 m)    Weight:  Wt Readings from Last 10 Encounters:  05/12/21 51.2 kg  03/30/21 51 kg  02/13/21 54.9 kg  12/26/20 50.7 kg  12/02/20 51 kg  11/24/20 50.7 kg  10/29/20 49.7 kg  03/31/18 51.6 kg  12/11/17 51.2 kg  07/08/17 43 kg   BMI:  Body mass index is 18.77 kg/m.  Estimated Nutritional Needs:   Kcal:  1600-1800  Protein:  80-90g  Fluid:  >1.6L/d    Larkin Ina, MS, RD, LDN RD pager number and weekend/on-call pager number located in [place].

## 2021-05-15 NOTE — Progress Notes (Addendum)
Daily Rounding Note  05/15/2021, 11:54 AM  LOS: 2 days   SUBJECTIVE:   Chief complaint:    Colitis.  Since getting 20 mg of Dulcolax and doses of MiraLAX yesterday having several watery stool with bits of brown stool.  There is no blood, no melena. Feeling a little bit nauseated this morning but no emesis.  OBJECTIVE:         Vital signs in last 24 hours:    Temp:  [98.6 F (37 C)-100.2 F (37.9 C)] 100.2 F (37.9 C) (05/27 0619) Pulse Rate:  [68-92] 78 (05/27 0911) Resp:  [17-18] 18 (05/27 0619) BP: (132-145)/(36-46) 145/46 (05/27 0911) SpO2:  [83 %-94 %] 91 % (05/27 0619) Last BM Date: 05/14/21 Filed Weights   05/12/21 1353  Weight: 51.2 kg   General: Frail, NAD Heart: RRR Chest: Clear.  No labored breathing. Abdomen: Watery brown stool with inclusions of soft brown stool.  No blood.   DRE with no palpable stool or masses.   Abdomen soft.  Not tender, not distended Extremities: No CCE Neuro/Psych: Oriented x3.  Appropriate.  Able to move with minimal assistance from the bed to the bedside commode.  Intake/Output from previous day: No intake/output data recorded.  Intake/Output this shift: No intake/output data recorded.  Lab Results: Recent Labs    05/13/21 0246 05/14/21 0327 05/15/21 0158  WBC 10.6* 8.0 8.5  HGB 7.7* 7.3* 7.3*  HCT 24.0* 22.7* 22.6*  PLT 171 164 188   BMET Recent Labs    05/13/21 0246 05/14/21 0327 05/15/21 0158  NA 134* 135 134*  K 4.8 3.9 3.5  CL 107 106 105  CO2 18* 20* 19*  GLUCOSE 180* 158* 132*  BUN 30* 31* 28*  CREATININE 2.57* 2.60* 2.62*  CALCIUM 8.7* 9.0 8.7*   LFT Recent Labs    05/12/21 1346  PROT 7.4  ALBUMIN 4.1  AST 28  ALT 14  ALKPHOS 103  BILITOT 0.9   PT/INR No results for input(s): LABPROT, INR in the last 72 hours. Hepatitis Panel No results for input(s): HEPBSAG, HCVAB, HEPAIGM, HEPBIGM in the last 72 hours.  Studies/Results: No  results found.   Scheduled Meds: . amLODipine  10 mg Oral Daily  . atorvastatin  20 mg Oral Daily  . cyanocobalamin  1,000 mcg Intramuscular Daily  . diclofenac Sodium  2 g Topical QID  . heparin  5,000 Units Subcutaneous Q8H  . hydrALAZINE  100 mg Oral Q8H  . insulin aspart  0-9 Units Subcutaneous TID WC  . isosorbide mononitrate  60 mg Oral Daily  . levothyroxine  150 mcg Oral Q0600  . metoprolol succinate  25 mg Oral Daily  . pantoprazole  40 mg Oral Daily  . polyethylene glycol  17 g Oral BID  . pravastatin  20 mg Oral Daily   Continuous Infusions: PRN Meds:.acetaminophen, hydrALAZINE   ASSESMENT:   *   Obstipation, suspicion for stercoral colitis.  *     IDA.  Suspect anemia of chronic kidney disease. Received Feraheme. Hgb stable  7.3.    *    Hx adrenal insufficiency previously took hydrocortisone daily but no longer.  *   CKD.  Stage 4.      PLAN   *    follow-up with PCP regarding anemia.  Would be best if they could get a second dose of Feraheme within 5 days.  May need periodic Feraheme going forward. Consider hematology outpatient consultation who  can follow her anemia and order Feraheme easily.  May also benefit from periodic CSA infusion.  *   Change MiraLAX to 17 g once daily and next dose will be in the morning.    [name]  05/15/2021, 11:54 AM Phone [phone]  I have discussed the case with the APP, and that is the plan I formulated. I personally interviewed and examined the patient.  Obstipation, impaction has been relieved.  CT scan suggesting rectal wall thickening from impaction, no overt bleeding.  Anemia chronic kidney disease with low iron.  IV iron recommended.  She is now on MiraLAX daily.  If stool continues to be loose, stop it for a day and then resume at half dose.  She should be on it long-term at home to avoid obstipation and impaction recurring.  I do not feel she needs endoscopic testing at this point.  Signing off,  please call as needed.  Nelida Meuse III Office: [phone]

## 2021-05-15 NOTE — Progress Notes (Signed)
PROGRESS NOTE    [name]  [id number] DOB: Jun 02, 1961 DOA: 05/12/2021 PCP: Ladell Pier, MD  Brief Narrative:  60 y.o.femalewithhistory of hypertension, diabetes mellitus type 2, chronic kidney disease stage IV, chronic anemia, hypothyroidism and previous history of adrenal insufficiency was brought to the ER with complaints of having dizziness headache and nausea vomiting since yesterday morning. Patient's headache is mostly right frontal. Patient states yesterday he threw up 3 times. No diarrhea. Patient also was found to be confused when patient is to ER.  In the ER patient had CT abdomen pelvis which showed large stool burden and stercoral colitis for which patient had stool disimpaction done. Patient's labs were significant for potassium of 2.7. Patient hemoglobin and creatinine were at baseline. Patient was given potassium replacement and also 1 dose of hydrocortisone stress dose given history of adrenal insufficiency.  Patient admitted for further work-up. Chest x-ray shows cardiomegaly with possible pulm edema. COVID test was negative.   Assessment & Plan:   Principal Problem:   Dizziness Active Problems:   CKD (chronic kidney disease) stage 4, GFR 15-29 ml/min (HCC)   Hypokalemia   Fecal impaction (HCC)   Anemia due to chronic kidney disease   Stercoral colitis  Severe constipation Stercoral colitis - Patient does endorse chronic constipation at home and only takes oral laxative - Start on scheduled MiraLAX and Senokot-S - continue aggressive laxative regimen at discharge; oral iron likely contributing as well -Continues to have BMs -GI consulted, currently no plans for inpatient endoscopy Plan: Continue aggressive bowel regimen  History of adrenal insufficiency - Follows with endocrinology.  Patient was taken off of hydrocortisone in February 2022 and has a 72-monthfollow-up with endocrinology anticipated - Cortisol level 55 -Patient has  been lethargic and falls asleep easily -Via RN, patient's family states that this is baseline -Was too sleepy this morning for me to confirm medical readiness for discharge  Normocytic anemia - Patient takes oral iron tablet at home - Etiology likely in setting of underlying CKD - iron stores low and repleted on 05/13/21 -B12, 269.  Start on daily IM injections while hospitalized and will discharge on oral B12 - Folate level adequate -May need further iron supplementation outpatient intermittently -  Hemoglobin stable at 7.3.  No obvious signs of lower GI bleeding.  Continue suspect anemia of chronic kidney disease.  Patient is on room air thus no indication for transfusion at this time.  Dizziness, N/V - no obvious etiology; possibly related to underlying severe constipation and/or anemia - continue supportive care - no PT needs at discharge -CT head and MRI brain do show ventriculomegaly however similar to findings seen in 2021 but progressed from 2006  Hypokalemia -Possibly multifactorial from HCTZ and nausea/vomiting -Monitor and replace as necessary  Type 2 diabetes - Continue SSI and CBG monitoring  Hypertension - HCTZ on hold - Continue metoprolol, Imdur, amlodipine, hydralazine  Hypothyroidism - TSH 0.989 - Continue Synthroid   DVT prophylaxis: SQ heparin Code Status: Full Family Communication: None today Disposition Plan: Status is: Inpatient  Remains inpatient appropriate because:Inpatient level of care appropriate due to severity of illness   Dispo: The patient is from: Home              Anticipated d/c is to: Home              Patient currently is not medically stable to d/c.   Difficult to place patient No  Approaching medical readiness for discharge.  Continues to have  BMs.  Belly soft on exam.  Positive bowel sounds.  This morning was lethargic.  This may be baseline but unable to discharge home safely on this date.  Anticipate medical readiness for  discharge on 5/28     Level of care: Telemetry Medical  Consultants:   GI  Procedures:   None  Antimicrobials:   None   Subjective: Patient seen and examined.  Very sleepy.  Does awaken and answers questions appropriately however falls back asleep quickly.  Objective: Vitals:   05/14/21 1745 05/14/21 2203 05/15/21 0619 05/15/21 0911  BP:  (!) 132/38 (!) 137/36 (!) 145/46  Pulse: 68 74 92 78  Resp: '17 17 18   '$ Temp:  98.6 F (37 C) 100.2 F (37.9 C)   TempSrc:      SpO2: (!) 83% 94% 91%   Weight:      Height:        Intake/Output Summary (Last 24 hours) at 05/15/2021 1225 Last data filed at 05/14/2021 1945 Gross per 24 hour  Intake 0 ml  Output --  Net 0 ml   Filed Weights   05/12/21 1353  Weight: 51.2 kg    Examination:  General exam: Lethargic.  Appears frail Respiratory system: Poor respiratory effort.  Lungs clear.  2 L Cardiovascular system: S1-S2, regular rate and rhythm, no murmurs Gastrointestinal system: Thin, nontender, nondistended, normal bowel sounds Central nervous system: Lethargic.  Alert, oriented x2.  No focal deficits Extremities: Symmetric 5 x 5 power. Skin: No rashes, lesions or ulcers Psychiatry: Judgement and insight appear impaired. Mood & affect flattened.     Data Reviewed: I have personally reviewed following labs and imaging studies  CBC: Recent Labs  Lab 05/12/21 1346 05/13/21 0035 05/13/21 0246 05/14/21 0327 05/15/21 0158  WBC 10.7* 12.2* 10.6* 8.0 8.5  NEUTROABS  --   --   --  5.0 5.2  HGB 7.9* 7.7* 7.7* 7.3* 7.3*  HCT 23.8* 23.5* 24.0* 22.7* 22.6*  MCV 85.9 88.0 87.9 88.7 88.3  PLT 205 185 171 164 0000000   Basic Metabolic Panel: Recent Labs  Lab 05/12/21 1346 05/13/21 0035 05/13/21 0246 05/14/21 0327 05/15/21 0158  NA 135 132* 134* 135 134*  K 2.7* 5.2* 4.8 3.9 3.5  CL 103 107 107 106 105  CO2 20* 17* 18* 20* 19*  GLUCOSE 176* 185* 180* 158* 132*  BUN 27* 29* 30* 31* 28*  CREATININE 2.49* 2.61*  2.57* 2.60* 2.62*  CALCIUM 9.0 8.5* 8.7* 9.0 8.7*  MG  --  2.0  --  2.6* 2.3   GFR: Estimated Creatinine Clearance: 18.2 mL/min (A) (by C-G formula based on SCr of 2.62 mg/dL (H)). Liver Function Tests: Recent Labs  Lab 05/12/21 1346  AST 28  ALT 14  ALKPHOS 103  BILITOT 0.9  PROT 7.4  ALBUMIN 4.1   Recent Labs  Lab 05/12/21 1346  LIPASE 33   Recent Labs  Lab 05/12/21 1448  AMMONIA 34   Coagulation Profile: No results for input(s): INR, PROTIME in the last 168 hours. Cardiac Enzymes: No results for input(s): CKTOTAL, CKMB, CKMBINDEX, TROPONINI in the last 168 hours. BNP (last 3 results) No results for input(s): PROBNP in the last 8760 hours. HbA1C: No results for input(s): HGBA1C in the last 72 hours. CBG: Recent Labs  Lab 05/14/21 1204 05/14/21 1608 05/14/21 2203 05/15/21 0748 05/15/21 1118  GLUCAP 242* 239* 125* 109* 160*   Lipid Profile: No results for input(s): CHOL, HDL, LDLCALC, TRIG, CHOLHDL, LDLDIRECT in the last  72 hours. Thyroid Function Tests: Recent Labs    05/13/21 0052  TSH 0.989   Anemia Panel: Recent Labs    05/13/21 0052  VITAMINB12 269  FOLATE 27.0  FERRITIN 117  TIBC 246*  IRON 16*   Sepsis Labs: No results for input(s): PROCALCITON, LATICACIDVEN in the last 168 hours.  Recent Results (from the past 240 hour(s))  Resp Panel by RT-PCR (Flu A&B, Covid) Nasopharyngeal Swab     Status: None   Collection Time: 05/12/21  2:48 PM   Specimen: Nasopharyngeal Swab; Nasopharyngeal(NP) swabs in vial transport medium  Result Value Ref Range Status   SARS Coronavirus 2 by RT PCR NEGATIVE NEGATIVE Final    Comment: (NOTE) SARS-CoV-2 target nucleic acids are NOT DETECTED.  The SARS-CoV-2 RNA is generally detectable in upper respiratory specimens during the acute phase of infection. The lowest concentration of SARS-CoV-2 viral copies this assay can detect is 138 copies/mL. A negative result does not preclude SARS-Cov-2 infection and  should not be used as the sole basis for treatment or other patient management decisions. A negative result may occur with  improper specimen collection/handling, submission of specimen other than nasopharyngeal swab, presence of viral mutation(s) within the areas targeted by this assay, and inadequate number of viral copies(<138 copies/mL). A negative result must be combined with clinical observations, patient history, and epidemiological information. The expected result is Negative.  Fact Sheet for Patients:  EntrepreneurPulse.com.au  Fact Sheet for Healthcare Providers:  IncredibleEmployment.be  This test is no t yet approved or cleared by the [place] FDA and  has been authorized for detection and/or diagnosis of SARS-CoV-2 by FDA under an Emergency Use Authorization (EUA). This EUA will remain  in effect (meaning this test can be used) for the duration of the COVID-19 declaration under Section 564(b)(1) of the Act, 21 U.S.C.section 360bbb-3(b)(1), unless the authorization is terminated  or revoked sooner.       Influenza A by PCR NEGATIVE NEGATIVE Final   Influenza B by PCR NEGATIVE NEGATIVE Final    Comment: (NOTE) The Xpert Xpress SARS-CoV-2/FLU/RSV plus assay is intended as an aid in the diagnosis of influenza from Nasopharyngeal swab specimens and should not be used as a sole basis for treatment. Nasal washings and aspirates are unacceptable for Xpert Xpress SARS-CoV-2/FLU/RSV testing.  Fact Sheet for Patients: EntrepreneurPulse.com.au  Fact Sheet for Healthcare Providers: IncredibleEmployment.be  This test is not yet approved or cleared by the [place] FDA and has been authorized for detection and/or diagnosis of SARS-CoV-2 by FDA under an Emergency Use Authorization (EUA). This EUA will remain in effect (meaning this test can be used) for the duration of the COVID-19 declaration  under Section 564(b)(1) of the Act, 21 U.S.C. section 360bbb-3(b)(1), unless the authorization is terminated or revoked.  Performed at [facility], [address] [place], [place] [address]   Blood culture (routine x 2)     Status: None (Preliminary result)   Collection Time: 05/12/21  4:30 PM   Specimen: BLOOD  Result Value Ref Range Status   Specimen Description   Final    BLOOD Performed at [facility], [address], [place], [place] [address]    Special Requests   Final    Blood Culture adequate volume BOTTLES DRAWN AEROBIC AND ANAEROBIC LEFT ANTECUBITAL Performed at [facility], [address] [place], [place] [address]    Culture   Final    NO GROWTH 3 DAYS Performed at [facility], [address], [place], [place] [address]    Report Status PENDING  Incomplete  Blood culture (routine x 2)     Status: None (Preliminary result)   Collection Time: 05/12/21  5:00 PM   Specimen: BLOOD  Result Value Ref Range Status   Specimen Description   Final    BLOOD BLOOD LEFT FOREARM Performed at [facility], [address], [place], [place] [address]    Special Requests   Final    BOTTLES DRAWN AEROBIC AND ANAEROBIC Blood Culture adequate volume Performed at [facility], [address], [place], [place] [address]    Culture   Final    NO GROWTH 3 DAYS Performed at [facility], [place] [address], [place], [place] [address]    Report Status PENDING  Incomplete         Radiology Studies: No results found.      Scheduled Meds: . amLODipine  10 mg Oral Daily  . atorvastatin  20 mg Oral Daily  . cyanocobalamin  1,000 mcg Intramuscular Daily  . diclofenac Sodium  2 g Topical QID  . heparin  5,000 Units Subcutaneous Q8H  . hydrALAZINE  100 mg Oral Q8H  . insulin aspart  0-9 Units Subcutaneous TID WC  . isosorbide mononitrate  60 mg Oral Daily  .  levothyroxine  150 mcg Oral Q0600  . metoprolol succinate  25 mg Oral Daily  . pantoprazole  40 mg Oral Daily  . polyethylene glycol  17 g Oral BID  . pravastatin  20 mg Oral Daily   Continuous Infusions:   LOS: 2 days    Time spent: 25 minutes    Sidney Ace, MD [facility] Pager [phone]  If 7PM-7AM, please contact night-coverage 05/15/2021, 12:25 PM

## 2021-05-16 LAB — BASIC METABOLIC PANEL
Anion gap: 6 (ref 5–15)
BUN: 25 mg/dL — ABNORMAL HIGH (ref 8–23)
CO2: 23 mmol/L (ref 22–32)
Calcium: 8.6 mg/dL — ABNORMAL LOW (ref 8.9–10.3)
Chloride: 103 mmol/L (ref 98–111)
Creatinine, Ser: 2.52 mg/dL — ABNORMAL HIGH (ref 0.44–1.00)
GFR, Estimated: 21 mL/min — ABNORMAL LOW (ref >60)
Glucose, Bld: 135 mg/dL — ABNORMAL HIGH (ref 70–99)
Potassium: 3.5 mmol/L (ref 3.5–5.1)
Sodium: 132 mmol/L — ABNORMAL LOW (ref 135–145)

## 2021-05-16 LAB — CBC WITH DIFFERENTIAL/PLATELET
Abs Immature Granulocytes: 0.04 10*3/uL (ref 0.00–0.07)
Basophils Absolute: 0.1 10*3/uL (ref 0.0–0.1)
Basophils Relative: 1 %
Eosinophils Absolute: 0.4 10*3/uL (ref 0.0–0.5)
Eosinophils Relative: 6 %
HCT: 21.3 % — ABNORMAL LOW (ref 36.0–46.0)
Hemoglobin: 7 g/dL — ABNORMAL LOW (ref 12.0–15.0)
Immature Granulocytes: 1 %
Lymphocytes Relative: 29 %
Lymphs Abs: 1.8 10*3/uL (ref 0.7–4.0)
MCH: 28.9 pg (ref 26.0–34.0)
MCHC: 32.9 g/dL (ref 30.0–36.0)
MCV: 88 fL (ref 80.0–100.0)
Monocytes Absolute: 0.5 10*3/uL (ref 0.1–1.0)
Monocytes Relative: 7 %
Neutro Abs: 3.5 10*3/uL (ref 1.7–7.7)
Neutrophils Relative %: 56 %
Platelets: 192 10*3/uL (ref 150–400)
RBC: 2.42 MIL/uL — ABNORMAL LOW (ref 3.87–5.11)
RDW: 13.7 % (ref 11.5–15.5)
WBC: 6.2 10*3/uL (ref 4.0–10.5)
nRBC: 0.5 % — ABNORMAL HIGH (ref 0.0–0.2)

## 2021-05-16 LAB — GLUCOSE, CAPILLARY
Glucose-Capillary: 96 mg/dL (ref 70–99)
Glucose-Capillary: 173 mg/dL — ABNORMAL HIGH (ref 70–99)

## 2021-05-16 LAB — MAGNESIUM: Magnesium: 2.3 mg/dL (ref 1.7–2.4)

## 2021-05-16 MED ORDER — POLYETHYLENE GLYCOL 3350 17 G PO PACK: 17.0000 g | PACK | Freq: Every day | ORAL | 0 refills | Status: DC

## 2021-05-16 MED ORDER — VITAMIN B-12 1000 MCG PO TABS: 1000.0000 ug | ORAL_TABLET | Freq: Every day | ORAL | 0 refills | Status: AC

## 2021-05-16 NOTE — Discharge Summary (Addendum)
PATIENT DETAILS Name: [name] Age: 60 y.o. Sex: female Date of Birth: October 04, 1961 MRN: [id number]. Admitting Physician: Dwyane Dee, MD [id number], Dalbert Batman, MD  Admit Date: 05/12/2021 Discharge date: 05/16/2021  Recommendations for Outpatient Follow-up:  1. Follow up with PCP in 1-2 weeks 2. Please obtain CMP/CBC in one week 3. Ensure follow-up with neurology 4. Repeat B12 levels in 3 months   Admitted From:  Home  Disposition: [place]: No  Equipment/Devices: None  Discharge Condition: Stable  CODE STATUS: FULL CODE  Diet recommendation:  Diet Order            Diet - low sodium heart healthy           Diet Carb Modified           Diet Carb Modified Fluid consistency: Thin; Room service appropriate? Yes  Diet effective now                  Brief Summary: See H&P, Labs, Consult and Test reports for all details in brief, patient is a 60 year old female with history of HTN, DM-2, CKD stage IV, hypothyroidism-presented with nausea/vomiting, dizziness-upon further evaluation in the emergency room with a CT abdomen/pelvis-she was found to have large stool burden with stercoral colitis.  Disimpaction was performed-patient was then admitted to the hospitalist service.  See below for further details.  Brief Hospital Course: Stercoral colitis: Managed with supportive care-evaluated by GI-no need for endoscopic evaluation.  Severe constipation with fecal impaction: Likely causing above-treated with laxative regimen.  Plan to discharge on MiraLAX-stool loose but not diarrhea.  Patient instructed to reduce dose if she starts having diarrhea/worsening loose stools.  Nausea/vomiting/dizziness: Resolved.  MRI brain negative for acute abnormalities.  Ventriculomegaly: Seen on MRI-Per radiology-out of proportion to volume loss-recommend outpatient neurology evaluation-epic referral sent to neurology.  Partially empty sella syndrome: Nonspecific  finding-and stable for further monitoring.  Normocytic anemia: Likely due to CKD-no evidence of blood loss.  Will defer further to primary nephrologist/primary care practitioner.  She currently appears asymptomatic.  Have asked patient's daughter to call nephrologist office early next week to see if she can get an appointment-suspect she may need to be started on Aranesp  DM-2: CBG stable with SSI-resume oral hypoglycemic regimen on discharge.  HTN: BP stable-continue amlodipine, hydralazine, Imdur and metoprolol.  Chlorthalidone remains on hold given stage IV CKD.  Hypothyroidism: Continue Synthroid  Vitamin B12 deficiency: Continue supplementation on discharge.  Please repeat B12 levels in 3 months.  Note-daughter-Ms. Waynetta Sandy updated on the day of discharge-via phone.  Discharge Diagnoses:  Principal Problem:   Dizziness Active Problems:   CKD (chronic kidney disease) stage 4, GFR 15-29 ml/min (HCC)   Hypokalemia   Fecal impaction (HCC)   Anemia due to chronic kidney disease   Stercoral colitis   Discharge Instructions:  Activity:  As tolerated   Discharge Instructions    Ambulatory referral to Neurology   Complete by: As directed    An appointment is requested in approximately: 2 weeks   Diet - low sodium heart healthy   Complete by: As directed    Diet Carb Modified   Complete by: As directed    Discharge instructions   Complete by: As directed    1.)  Stay on MiraLAX daily-if you are stools become loose-use half dose.  2.)  You have anemia due to chronic kidney failure-please ask your primary care practitioner for referral to nephrology.  3.)  Your vitamin B12  levels are low-you will be started on supplementation.  Please ask your primary care practitioner to repeat vitamin B12 levels in 3 months or so.  4.)  Incidental finding of enlarged ventricles in your brain-you will need a outpatient neurology evaluation-a referral has been sent to neurology-their office  will be getting in touch with you soon.  If you do not hear from them-please give them a call.   Follow with Primary MD  Ladell Pier, MD in 1-2 weeks  Please get a complete blood count and chemistry panel checked by your Primary MD at your next visit, and again as instructed by your Primary MD.  Get Medicines reviewed and adjusted: Please take all your medications with you for your next visit with your Primary MD  Laboratory/radiological data: Please request your Primary MD to go over all hospital tests and procedure/radiological results at the follow up, please ask your Primary MD to get all Hospital records sent to his/her office.  In some cases, they will be blood work, cultures and biopsy results pending at the time of your discharge. Please request that your primary care M.D. follows up on these results.  Also Note the following: If you experience worsening of your admission symptoms, develop shortness of breath, life threatening emergency, suicidal or homicidal thoughts you must seek medical attention immediately by calling 911 or calling your MD immediately  if symptoms less severe.  You must read complete instructions/literature along with all the possible adverse reactions/side effects for all the Medicines you take and that have been prescribed to you. Take any new Medicines after you have completely understood and accpet all the possible adverse reactions/side effects.   Do not drive when taking Pain medications or sleeping medications (Benzodaizepines)  Do not take more than prescribed Pain, Sleep and Anxiety Medications. It is not advisable to combine anxiety,sleep and pain medications without talking with your primary care practitioner  Special Instructions: If you have smoked or chewed Tobacco  in the last 2 yrs please stop smoking, stop any regular Alcohol  and or any Recreational drug use.  Wear Seat belts while driving.  Please note: You were cared for by a  hospitalist during your hospital stay. Once you are discharged, your primary care physician will handle any further medical issues. Please note that NO REFILLS for any discharge medications will be authorized once you are discharged, as it is imperative that you return to your primary care physician (or establish a relationship with a primary care physician if you do not have one) for your post hospital discharge needs so that they can reassess your need for medications and monitor your lab values.   Increase activity slowly   Complete by: As directed      Allergies as of 05/16/2021      Reactions   Percocet [oxycodone-acetaminophen] Rash      Medication List    STOP taking these medications   chlorthalidone 25 MG tablet Commonly known as: HYGROTON   pravastatin 20 MG tablet Commonly known as: PRAVACHOL     TAKE these medications   acetaminophen 325 MG tablet Commonly known as: TYLENOL Take 2 tablets (650 mg total) by mouth every 6 (six) hours as needed for mild pain.   amLODipine 10 MG tablet Commonly known as: NORVASC TAKE 1 TABLET (10 MG TOTAL) BY MOUTH DAILY. What changed: how much to take   aspirin 81 MG chewable tablet Chew 81 mg by mouth daily.   atorvastatin 20 MG tablet Commonly  known as: LIPITOR TAKE 1 TABLET (20 MG TOTAL) BY MOUTH DAILY. What changed: how much to take   CALCIUM 1200 PO Take 1,200 mg by mouth daily.   fluticasone 50 MCG/ACT nasal spray Commonly known as: FLONASE PLACE 2 SPRAYS INTO BOTH NOSTRILS DAILY.   hydrALAZINE 100 MG tablet Commonly known as: APRESOLINE Take 1 tablet (100 mg total) by mouth 3 (three) times daily.   isosorbide mononitrate 60 MG 24 hr tablet Commonly known as: IMDUR TAKE 1 TABLET BY MOUTH DAILY What changed: how much to take   levothyroxine 150 MCG tablet Commonly known as: SYNTHROID TAKE 1 TABLET (150 MCG TOTAL) BY MOUTH DAILY BEFORE BREAKFAST. What changed:   how much to take  how to take this  when to  take this   metoprolol succinate 25 MG 24 hr tablet Commonly known as: TOPROL-XL Take 1 tablet (25 mg total) by mouth daily.   multivitamin with minerals Tabs tablet Take 1 tablet by mouth daily.   omeprazole 40 MG capsule Commonly known as: PRILOSEC TAKE 1 CAPSULE (40 MG TOTAL) BY MOUTH DAILY. What changed: how much to take   polyethylene glycol 17 g packet Commonly known as: MIRALAX / GLYCOLAX Take 17 g by mouth daily. Start taking on: May 17, 2021   repaglinide 0.5 MG tablet Commonly known as: PRANDIN TAKE 1 TABLET (0.5 MG TOTAL) BY MOUTH 2 (TWO) TIMES DAILY BEFORE A MEAL. What changed:   how much to take  how to take this  when to take this   vitamin B-12 1000 MCG tablet Commonly known as: CYANOCOBALAMIN Take 1 tablet (1,000 mcg total) by mouth daily.            Durable Medical Equipment  (From admission, onward)         Start     Ordered   05/14/21 1112  For home use only DME 3 n 1  Once        05/14/21 1112          Follow-up Information    Ladell Pier, MD. Schedule an appointment as soon as possible for a visit in 1 week(s).   Specialty: Internal Medicine Contact information: [place] [address] [phone]        Buford Dresser, MD Follow up in 1 month(s).   Specialty: Cardiology Contact information: [address] [place] [address] [phone]        [facility] Follow up.   Why: office will call-if you do not hear from them-please give them a call in a few days. Contact information: [address]     [address] [place] [phone]             Allergies  Allergen Reactions  . Percocet [Oxycodone-Acetaminophen] Rash    Consultations:   GI  Other Procedures/Studies: CT Abdomen Pelvis Wo Contrast  Result Date: 05/12/2021 CLINICAL DATA:  Acute abdominal pain. Altered mental status and drowsiness. Weakness EXAM: CT  ABDOMEN AND PELVIS WITHOUT CONTRAST TECHNIQUE: Multidetector CT imaging of the abdomen and pelvis was performed following the standard protocol without IV contrast. COMPARISON:  CT 11/20/2020 FINDINGS: Lower chest: Borderline cardiomegaly with trace pericardial effusion. Previous pleural effusions have resolved. Suggestion of mild ground-glass opacity at the lung bases, not well assessed due to motion. Small amount of high-density material in the distal esophagus. Hepatobiliary: No focal liver abnormality is seen. No gallstones, gallbladder wall thickening, or biliary dilatation. Pancreas: Mild parenchymal  atrophy. No ductal dilatation or inflammation. Spleen: Normal in size without focal abnormality. Splenule anteriorly Adrenals/Urinary Tract: Normal adrenal glands. No hydronephrosis or renal calculi. No perinephric edema. 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 Decompressed ureters. Partially distended urinary bladder. Stomach/Bowel: Small amount of high-density material in the distal stomach. Stomach decompressed. No small bowel obstruction or obvious inflammation. Normal appendix. There is a large volume of stool distending the rectum, rectal distention of 6.4 cm. Associated rectal wall thickening and perirectal fat stranding. No focal fluid collection or pneumatosis. Moderate stool in the sigmoid, with small volume of stool in the more proximal colon. Vascular/Lymphatic: Aortic atherosclerosis without aneurysm. No bulky abdominopelvic adenopathy. Reproductive: Normal for age uterine atrophy.  No adnexal mass. Other: Perirectal fat stranding and inflammation. Trace presacral inflammation and edema. No other ascites. No free air or focal fluid collection. No abdominal wall hernia. Musculoskeletal: There are no acute or suspicious osseous abnormalities. IMPRESSION: 1. Large volume of stool distending the rectum, rectal distention of 6.4 cm with associated rectal wall  thickening and perirectal fat stranding. Findings consistent with fecal impaction and stercoral colitis. No perforation or abscess. 2. Small amount of high-density material in the distal esophagus, can be seen with reflux or slow transit. 3. Suggestion of mild ground-glass opacity at the lung bases, not well assessed due to motion, but possibly pulmonary edema. Aortic Atherosclerosis (ICD10-I70.0). Electronically Signed   By: Keith Rake M.D.   On: 05/12/2021 16:13   CT HEAD WO CONTRAST  Result Date: 05/13/2021 CLINICAL DATA:  Right-sided headache. EXAM: CT HEAD WITHOUT CONTRAST TECHNIQUE: Contiguous axial images were obtained from the base of the skull through the vertex without intravenous contrast. COMPARISON:  CT head 11/20/2020 FINDINGS: Brain: No evidence of large-territorial acute infarction. No parenchymal hemorrhage. No mass lesion. No extra-axial collection. No mass effect or midline shift. Redemonstration of prominent ventricles. Basilar cisterns are patent. Vascular: No hyperdense vessel. Skull: No acute fracture or focal lesion. Sinuses/Orbits: Paranasal sinuses and mastoid air cells are clear. The orbits are unremarkable. Other: None. IMPRESSION: Persistent prominent ventricles with otherwise no acute intracranial abnormality. Electronically Signed   By: Iven Finn M.D.   On: 05/13/2021 01:25   MR BRAIN WO CONTRAST  Result Date: 05/13/2021 CLINICAL DATA:  Dizziness. EXAM: MRI HEAD WITHOUT CONTRAST TECHNIQUE: Multiplanar, multiecho pulse sequences of the brain and surrounding structures were obtained without intravenous contrast. COMPARISON:  Multiple priors. Most recent prior is the CT head from the same day. FINDINGS: Brain: Similar ventriculomegaly, which is out of proportion to the degree of sulcal volume loss. Ventricular dilation is similar to priors dating back to 2021 and progressed from the more remote 2006 prior. No obvious obstructing mass lesion on this noncontrast study.  The foramen of Monro and cerebral aquaduct appear patent. No acute infarct. No extra-axial fluid collection. No acute hemorrhage. No midline shift. Basal cisterns are patent. Normal position of the cerebellar tonsils. Partially empty sella. Vascular: Major arterial flow voids are maintained. Small right transverse sinus. Skull and upper cervical spine: Normal marrow signal. Sinuses/Orbits: Mild pansinus mucosal thickening with small frontal sinuses. Other: Small bilateral mastoid effusions. IMPRESSION: 1. Ventriculomegaly, which is out of proportion to the degree of sulcal volume loss. This finding is similar to priors dating back to 2021 and progressed from the more remote 2006 prior. Consider communicating hydrocephalus (including normal-pressure hydrocephalus). 2. Otherwise, no acute intracranial abnormality.  No acute infarct. 3. Mild pansinus paranasal sinus mucosal thickening and  small bilateral mastoid effusions. 4. Partially empty sella. This finding is nonspecific and may be incidental, but can be seen with idiopathic intracranial hypertension in correct clinical setting. Electronically Signed   By: Margaretha Sheffield MD   On: 05/13/2021 07:01   DG Chest Port 1 View  Result Date: 05/12/2021 CLINICAL DATA:  Fever and weakness. EXAM: PORTABLE CHEST 1 VIEW COMPARISON:  11/22/2020 FINDINGS: Improved lung volumes from prior exam. Cardiomegaly is unchanged. Stable mediastinal contours. Previous patchy airspace opacities have resolved in the interim. There is mild bronchial thickening. Question of mild septal thickening at the bases. No significant pleural effusion. No pneumothorax. Stable osseous structures. IMPRESSION: Bronchial thickening with possible mild septal thickening at the bases, pulmonary edema versus atypical infection. Cardiomegaly is unchanged. Electronically Signed   By: Keith Rake M.D.   On: 05/12/2021 15:58     TODAY-DAY OF DISCHARGE:  Subjective:   [name] today has no  headache,no chest abdominal pain,no new weakness tingling or numbness, feels much better wants to go home today.   Objective:   Blood pressure (!) 150/49, pulse 71, temperature 99.3 F (37.4 C), temperature source Oral, resp. rate 18, height '5\' 5"'$  (1.651 m), weight 51.2 kg, SpO2 98 %.  Intake/Output Summary (Last 24 hours) at 05/16/2021 0944 Last data filed at 05/16/2021 [id number] Gross per 24 hour  Intake 240 ml  Output --  Net 240 ml   Filed Weights   05/12/21 1353  Weight: 51.2 kg    Exam: Awake Alert, Oriented *3, No new F.N deficits, Normal affect [place].AT,PERRAL Supple Neck,No JVD, No cervical lymphadenopathy appriciated.  Symmetrical Chest wall movement, Good air movement bilaterally, CTAB RRR,No Gallops,Rubs or new Murmurs, No Parasternal Heave +ve B.Sounds, Abd Soft, Non tender, No organomegaly appriciated, No rebound -guarding or rigidity. No Cyanosis, Clubbing or edema, No new Rash or bruise   PERTINENT RADIOLOGIC STUDIES: No results found.   PERTINENT LAB RESULTS: CBC: Recent Labs    05/15/21 0158 05/16/21 0110  WBC 8.5 6.2  HGB 7.3* 7.0*  HCT 22.6* 21.3*  PLT 188 192   CMET CMP     Component Value Date/Time   NA 132 (L) 05/16/2021 0110   NA 139 10/29/2020 1527   K 3.5 05/16/2021 0110   CL 103 05/16/2021 0110   CO2 23 05/16/2021 0110   GLUCOSE 135 (H) 05/16/2021 0110   BUN 25 (H) 05/16/2021 0110   BUN 30 (H) 10/29/2020 1527   CREATININE 2.52 (H) 05/16/2021 0110   CALCIUM 8.6 (L) 05/16/2021 0110   PROT 7.4 05/12/2021 1346   PROT 7.0 10/29/2020 1527   ALBUMIN 4.1 05/12/2021 1346   ALBUMIN 4.5 10/29/2020 1527   AST 28 05/12/2021 1346   ALT 14 05/12/2021 1346   ALKPHOS 103 05/12/2021 1346   BILITOT 0.9 05/12/2021 1346   BILITOT 0.2 10/29/2020 1527   GFRNONAA 21 (L) 05/16/2021 0110   GFRAA 27 (L) 10/29/2020 1527    GFR Estimated Creatinine Clearance: 18.9 mL/min (A) (by C-G formula based on SCr of 2.52 mg/dL (H)). No results for input(s): LIPASE,  AMYLASE in the last 72 hours. No results for input(s): CKTOTAL, CKMB, CKMBINDEX, TROPONINI in the last 72 hours. Invalid input(s): POCBNP No results for input(s): DDIMER in the last 72 hours. No results for input(s): HGBA1C in the last 72 hours. No results for input(s): CHOL, HDL, LDLCALC, TRIG, CHOLHDL, LDLDIRECT in the last 72 hours. No results for input(s): TSH, T4TOTAL, T3FREE, THYROIDAB in the last 72 hours.  Invalid input(s):  FREET3 No results for input(s): VITAMINB12, FOLATE, FERRITIN, TIBC, IRON, RETICCTPCT in the last 72 hours. Coags: No results for input(s): INR in the last 72 hours.  Invalid input(s): PT Microbiology: Recent Results (from the past 240 hour(s))  Resp Panel by RT-PCR (Flu A&B, Covid) Nasopharyngeal Swab     Status: None   Collection Time: 05/12/21  2:48 PM   Specimen: Nasopharyngeal Swab; Nasopharyngeal(NP) swabs in vial transport medium  Result Value Ref Range Status   SARS Coronavirus 2 by RT PCR NEGATIVE NEGATIVE Final    Comment: (NOTE) SARS-CoV-2 target nucleic acids are NOT DETECTED.  The SARS-CoV-2 RNA is generally detectable in upper respiratory specimens during the acute phase of infection. The lowest concentration of SARS-CoV-2 viral copies this assay can detect is 138 copies/mL. A negative result does not preclude SARS-Cov-2 infection and should not be used as the sole basis for treatment or other patient management decisions. A negative result may occur with  improper specimen collection/handling, submission of specimen other than nasopharyngeal swab, presence of viral mutation(s) within the areas targeted by this assay, and inadequate number of viral copies(<138 copies/mL). A negative result must be combined with clinical observations, patient history, and epidemiological information. The expected result is Negative.  Fact Sheet for Patients:  EntrepreneurPulse.com.au  Fact Sheet for Healthcare Providers:   IncredibleEmployment.be  This test is no t yet approved or cleared by the [place] FDA and  has been authorized for detection and/or diagnosis of SARS-CoV-2 by FDA under an Emergency Use Authorization (EUA). This EUA will remain  in effect (meaning this test can be used) for the duration of the COVID-19 declaration under Section 564(b)(1) of the Act, 21 U.S.C.section 360bbb-3(b)(1), unless the authorization is terminated  or revoked sooner.       Influenza A by PCR NEGATIVE NEGATIVE Final   Influenza B by PCR NEGATIVE NEGATIVE Final    Comment: (NOTE) The Xpert Xpress SARS-CoV-2/FLU/RSV plus assay is intended as an aid in the diagnosis of influenza from Nasopharyngeal swab specimens and should not be used as a sole basis for treatment. Nasal washings and aspirates are unacceptable for Xpert Xpress SARS-CoV-2/FLU/RSV testing.  Fact Sheet for Patients: EntrepreneurPulse.com.au  Fact Sheet for Healthcare Providers: IncredibleEmployment.be  This test is not yet approved or cleared by the [place] FDA and has been authorized for detection and/or diagnosis of SARS-CoV-2 by FDA under an Emergency Use Authorization (EUA). This EUA will remain in effect (meaning this test can be used) for the duration of the COVID-19 declaration under Section 564(b)(1) of the Act, 21 U.S.C. section 360bbb-3(b)(1), unless the authorization is terminated or revoked.  Performed at [facility], [address], [name], [place] [address]   Blood culture (routine x 2)     Status: None (Preliminary result)   Collection Time: 05/12/21  4:30 PM   Specimen: BLOOD  Result Value Ref Range Status   Specimen Description   Final    BLOOD Performed at [facility], [address], [place], [place] [address]    Special Requests   Final    Blood Culture adequate volume BOTTLES DRAWN AEROBIC AND ANAEROBIC LEFT  ANTECUBITAL Performed at [facility], [address], [place], [place] [address]    Culture   Final    NO GROWTH 4 DAYS Performed at [facility], [place] [address] [place], [place] [address]    Report Status PENDING  Incomplete  Blood culture (routine x 2)     Status:  None (Preliminary result)   Collection Time: 05/12/21  5:00 PM   Specimen: BLOOD  Result Value Ref Range Status   Specimen Description   Final    BLOOD BLOOD LEFT FOREARM Performed at [facility], [address], [place], [place] [address]    Special Requests   Final    BOTTLES DRAWN AEROBIC AND ANAEROBIC Blood Culture adequate volume Performed at [facility], [address], [place], [place] [address]    Culture   Final    NO GROWTH 4 DAYS Performed at [facility], [place] [address] [place],  [address]    Report Status PENDING  Incomplete    FURTHER DISCHARGE INSTRUCTIONS:  Get Medicines reviewed and adjusted: Please take all your medications with you for your next visit with your Primary MD  Laboratory/radiological data: Please request your Primary MD to go over all hospital tests and procedure/radiological results at the follow up, please ask your Primary MD to get all Hospital records sent to his/her office.  In some cases, they will be blood work, cultures and biopsy results pending at the time of your discharge. Please request that your primary care M.D. goes through all the records of your hospital data and follows up on these results.  Also Note the following: If you experience worsening of your admission symptoms, develop shortness of breath, life threatening emergency, suicidal or homicidal thoughts you must seek medical attention immediately by calling 911 or calling your MD immediately  if symptoms less severe.  You must read complete instructions/literature along with all the possible adverse reactions/side effects for  all the Medicines you take and that have been prescribed to you. Take any new Medicines after you have completely understood and accpet all the possible adverse reactions/side effects.   Do not drive when taking Pain medications or sleeping medications (Benzodaizepines)  Do not take more than prescribed Pain, Sleep and Anxiety Medications. It is not advisable to combine anxiety,sleep and pain medications without talking with your primary care practitioner  Special Instructions: If you have smoked or chewed Tobacco  in the last 2 yrs please stop smoking, stop any regular Alcohol  and or any Recreational drug use.  Wear Seat belts while driving.  Please note: You were cared for by a hospitalist during your hospital stay. Once you are discharged, your primary care physician will handle any further medical issues. Please note that NO REFILLS for any discharge medications will be authorized once you are discharged, as it is imperative that you return to your primary care physician (or establish a relationship with a primary care physician if you do not have one) for your post hospital discharge needs so that they can reassess your need for medications and monitor your lab values.  Total Time spent coordinating discharge including counseling, education and face to face time equals 35 minutes.  SignedOren Binet 05/16/2021 9:44 AM

## 2021-05-17 LAB — CULTURE, BLOOD (ROUTINE X 2)
Culture: NO GROWTH
Culture: NO GROWTH
Special Requests: ADEQUATE

## 2021-05-18 MED ORDER — ONDANSETRON HCL 4 MG PO TABS
4.0000 mg | ORAL_TABLET | Freq: Every day | ORAL | 0 refills | Status: DC | PRN
  Filled 2021-05-18: qty 15, 15d supply, fill #0

## 2021-05-19 ENCOUNTER — Telehealth: Payer: Self-pay

## 2021-05-19 ENCOUNTER — Other Ambulatory Visit: Payer: Self-pay

## 2021-05-19 NOTE — Telephone Encounter (Signed)
Transition Care Management Follow-up Telephone Call  Call completed with patient's daughter, [name] Ramjit.  Date of discharge and from where: 05/16/2021, [facility]   How have you been since you were released from the hospital? She said her mother is somewhat okay.  She thinks her mother may have been sent home too soon. She explained that her mother has not been able to eat anything.  She had some soup since she has been home but that is all.  [name] stated that anything else makes her mother feel like vomiting.  She has been in touch with Dr Wynetta Emery about this issue and needs to pick up Zofran that was ordered and ready for pick up at the pharmacy.  She also noted that her mother has been trying to ambulate but has been complaining of dizziness  Any questions or concerns? Yes - noted above. She also wants to review all of the medications with Dr Wynetta Emery.  She is concerned that her mother has been taken off chlorthalidone and pravachol.  This CM offered her an appointment with another provider before 06/08/2021 but she wants to wait to see Dr Wynetta Emery and will call the clinic if she thinks her mother needs to be seen sooner.   Items Reviewed:  Did the pt receive and understand the discharge instructions provided? Yes   Medications obtained and verified? Yes   Other? No   Any new allergies since your discharge? No   Do you have support at home? Yes  - various family members have been COVID + so the patient has been staying with different families.  She normally would be staying with her son.   Home Care and Equipment/Supplies: Were home health services ordered? no If so, what is the name of the agency? n/a  Has the agency set up a time to come to the patient's home? not applicable Were any new equipment or medical supplies ordered?  Yes: 3:1 commode What is the name of the medical supply agency? [facility] Were you able to get the supplies/equipment? yes Do you have any questions  related to the use of the equipment or supplies? No   She has a glucometer and [name] stated that her blood sugars have been okay.   Functional Questionnaire: (I = Independent and D = Dependent) ADLs: independent, family has been providing assistance if needed   Follow up appointments reviewed:   PCP Hospital f/u appt confirmed? Yes Dr Wynetta Emery - 06/08/2021 @ [place] .  [facility] f/u appt confirmed? Yes  - cardiology- 05/27/2021. Needs appointments with neurology and nephrology.   Are transportation arrangements needed? No   If their condition worsens, is the pt aware to call PCP or go to the Emergency Dept.? Yes  Was the patient provided with contact information for the PCP's office or ED? Yes  Was to pt encouraged to call back with questions or concerns? Yes

## 2021-05-21 ENCOUNTER — Other Ambulatory Visit: Payer: Self-pay

## 2021-05-21 ENCOUNTER — Encounter: Payer: Self-pay | Admitting: Internal Medicine

## 2021-05-21 MED ORDER — TRAMADOL HCL 50 MG PO TABS
50.0000 mg | ORAL_TABLET | Freq: Two times a day (BID) | ORAL | 0 refills | Status: DC | PRN
  Filled 2021-05-21: qty 30, 15d supply, fill #0

## 2021-05-22 ENCOUNTER — Other Ambulatory Visit: Payer: Self-pay

## 2021-05-26 NOTE — Progress Notes (Signed)
Cardiology Office Note:    Date:  06/18/2021   ID:  [name], [name] [id number], MRN [id number]  PCP:  Ladell Pier, MD  Cardiologist:  Buford Dresser, MD  Referring MD: Ladell Pier, MD   CC: Follow up evaluation for diastolic heart failure.  History of Present Illness:    [name] is a 60 y.o. female with a hx of hypertension, type II diabetes, chronic kidney disease who is seen for follow-up. I initially met her 12/26/2020 as a new consult at the request of Ladell Pier, MD for the evaluation and management of diastolic heart failure.  Today: She is accompanied by her daughter, who also provides some history. She is not currently in pain, however she is feeling "shaky." Since her last visit she is still having significant shortness of breath when she climbs stairs or when she is walking from her car to this office. At rest her breathing is fine. She endorses having a cough for a while now. Also, in the past few days she has developed some bilateral LE edema.  At home her blood pressure averages around 110s-120s/40s-50s. Every other day she needs to skip doses of hydralazine when her blood pressure is in the 110s, and occasionally needs to skip amlodipine.  She states she would be able to walk a few minutes on a treadmill, and is amenable to a nuclear stress test.  She denies any chest pain, palpitations, headaches, lightheadedness, or syncope. Also has no lower extremity edema, orthopnea or PND.  Previously, she has never been on fluid medication.  Past Medical History:  Diagnosis Date   Adrenal insufficiency, primary ([place])    Anemia 2018   Hgb 6.8 early 11/2020.  PRBC x 1.  06/2017 PRBC x 1.     CHF (congestive heart failure) ([place]) 00/3704   Diastolic   CKD (chronic kidney disease) stage 4, GFR 15-29 ml/min ([place]) 04/2021   Stage 4 as of late May 2022   Diabetes mellitus without complication ([place])    Hypertension    Hypothyroidism     Past  Surgical History:  Procedure Laterality Date   CESAREAN SECTION     IR THORACENTESIS ASP PLEURAL SPACE W/IMG GUIDE  06/05/2021    Current Medications: Current Outpatient Medications on File Prior to Visit  Medication Sig   acetaminophen (TYLENOL) 325 MG tablet Take 2 tablets (650 mg total) by mouth every 6 (six) hours as needed for mild pain.   amLODipine (NORVASC) 10 MG tablet TAKE 1 TABLET (10 MG TOTAL) BY MOUTH DAILY.   aspirin 81 MG chewable tablet Chew 81 mg by mouth daily.   atorvastatin (LIPITOR) 20 MG tablet TAKE 1 TABLET (20 MG TOTAL) BY MOUTH DAILY.   Calcium Carbonate-Vit D-Min (CALCIUM 1200 PO) Take 1,200 mg by mouth daily.   fluticasone (FLONASE) 50 MCG/ACT nasal spray PLACE 2 SPRAYS INTO BOTH NOSTRILS DAILY.   isosorbide mononitrate (IMDUR) 60 MG 24 hr tablet TAKE 1 TABLET BY MOUTH DAILY   Multiple Vitamin (MULTIVITAMIN WITH MINERALS) TABS tablet Take 1 tablet by mouth daily.   omeprazole (PRILOSEC) 40 MG capsule TAKE 1 CAPSULE (40 MG TOTAL) BY MOUTH DAILY.   ondansetron (ZOFRAN) 4 MG tablet Take 1 tablet (4 mg total) by mouth daily as needed for nausea or vomiting.   polyethylene glycol (MIRALAX / GLYCOLAX) 17 g packet Take 17 g by mouth daily.   repaglinide (PRANDIN) 0.5 MG tablet TAKE 1 TABLET (0.5 MG TOTAL) BY MOUTH 2 (TWO) TIMES DAILY  BEFORE A MEAL.   traMADol (ULTRAM) 50 MG tablet Take 1 tablet (50 mg total) by mouth every 12 (twelve) hours as needed.   vitamin B-12 (CYANOCOBALAMIN) 1000 MCG tablet Take 1 tablet (1,000 mcg total) by mouth daily.   No current facility-administered medications on file prior to visit.     Allergies:   Beef-derived products, Pork-derived products, and Percocet [oxycodone-acetaminophen]   Social History   Tobacco Use   Smoking status: Never   Smokeless tobacco: Never  Substance Use Topics   Alcohol use: No   Drug use: No    Family History: family history includes Diabetes in her mother; Hyperlipidemia in her father; Hypertension  in her father. There is no history of Adrenal disorder.  ROS:   Please see the history of present illness. (+) Shortness of breath (+) Cough (+) Bilateral LE edema All other systems are reviewed and negative.     EKGs/Labs/Other Studies Reviewed:    The following studies were reviewed today:  US Renal Artery Duplex 03/13/2021: FINDINGS: Right Kidney: Length: 9.6 cm. Echogenicity within normal limits. No mass or hydronephrosis visualized.   Left Kidney: Length: 8.8 cm. Echogenicity within normal limits. No mass or hydronephrosis visualized.   Bladder:  Unremarkable   RENAL DUPLEX ULTRASOUND Right Renal Artery Velocities: Origin:  347 cm/sec Mid:  213 cm/sec [place]:  130 cm/sec Interlobar:  33 cm/sec Arcuate:  22 cm/sec   Left Renal Artery Velocities: Origin:  Obscured by shadowing bowel gas Mid:  251 cm/sec Hilum:  148 cm/sec Interlobar:  41 cm/sec Arcuate:  23 cm/sec Aortic Velocity:  108 cm/sec  Right Renal-Aortic Ratios: Origin: 3.2 Mid:  2.0 Hilum: 1.2 Interlobar: 0.3 Arcuate: 0.2  Left Renal-Aortic Ratios: Origin: Not visualized Mid: 2.3 Hilum: 1.4 Interlobar: 0.4 Arcuate: 0.2   Renal veins are patent. Atherosclerotic changes seen throughout the infrarenal abdominal aorta.   IMPRESSION: Doppler findings suspicious for bilateral renal artery stenosis, worse on the right.  Echo 11/21/20 1. Left ventricular ejection fraction, by estimation, is 65 to 70%. The  left ventricle has normal function. The left ventricle has no regional  wall motion abnormalities. Left ventricular diastolic parameters are  consistent with Grade II diastolic  dysfunction (pseudonormalization).   2. Right ventricular systolic function is normal. The right ventricular  size is normal. There is mildly elevated pulmonary artery systolic  pressure.   3. The mitral valve is grossly normal. Mild mitral valve regurgitation.  No evidence of mitral stenosis.   4. The aortic  valve is normal in structure. Aortic valve regurgitation is  not visualized. No aortic stenosis is present.   5. The inferior vena cava is normal in size with greater than 50%  respiratory variability, suggesting right atrial pressure of 3 mmHg.   EKG:  EKG is personally reviewed.   05/27/2021: EKG is not ordered today. 12/26/2020: sinus bradycardia at 57 bpm  Recent Labs: 05/16/2021: Magnesium 2.3 06/01/2021: B Natriuretic Peptide 1,106.6 06/02/2021: TSH 1.738 06/04/2021: ALT 17 06/09/2021: Hemoglobin 8.2; Platelets 187 06/11/2021: BUN 32; Creatinine, Ser 3.07; Potassium 3.9; Sodium 129  Recent Lipid Panel    Component Value Date/Time   CHOL 218 (H) 06/13/2011 0810   TRIG 85 06/13/2011 0810   HDL 65 06/13/2011 0810   CHOLHDL 3.4 06/13/2011 0810   VLDL 17 06/13/2011 0810   LDLCALC 136 (H) 06/13/2011 0810    Physical Exam:    VS:  BP (!) 118/48   Pulse 63   Ht $R'5\' 4"'cT$  (1.626 m)   Wt  110 lb 6.4 oz (50.1 kg)   SpO2 93%   BMI 18.95 kg/m     Wt Readings from Last 3 Encounters:  06/11/21 98 lb (44.5 kg)  05/27/21 110 lb 6.4 oz (50.1 kg)  05/12/21 112 lb 12.8 oz (51.2 kg)    GEN: Well nourished, well developed in no acute distress HEENT: Normal, moist mucous membranes NECK: Elevated JVD CARDIAC: regular rhythm, normal S1 and S2, no rubs or gallops. 3/6 systolic murmur. VASCULAR: Radial and DP pulses 2+ bilaterally. No carotid bruits. +renal bruit RESPIRATORY:  clear in upper fields, +LLL crackles ABDOMEN: Soft, non-tender, non-distended MUSCULOSKELETAL:  Ambulates independently SKIN: Warm and dry, 1+ bilateral LE edema NEUROLOGIC:  Alert and oriented x 3. No focal neuro deficits noted. PSYCHIATRIC:  Normal affect    ASSESSMENT:    1. Acute on chronic diastolic (congestive) heart failure (HCC)   2. Dyspnea on exertion   3. Murmur, cardiac   4. Bilateral leg edema   5. Rales   6. Essential hypertension   7. CKD (chronic kidney disease) stage 4, GFR 15-29 ml/min (HCC)     PLAN:    Dyspnea on exertion Bilateral LE edema, +JVD, +rales Acute on chronic diastolic heart failure -she appears to be in acute on chronic diastolic heart failure. They want to avoid hospitalization if possible.  -given edema, we will use torsemide for three days. Family will then call the office with an update -if symptoms do not improve or worsen, they need to present to the ER -we discussed that fluid and blood pressure management is the mainstay of treatment.  -renal function precludes use of entresto or SGLT2i -avoid bradycardia given diastolic dysfunction -once no longer in acute heart failure, could discuss nuclear treadmill stress at follow up  Hypertension Chronic kidney disease stage 4 -BP low end for her today -taking amlodipine 10 mg daily, hydralazine 100 mg TID, isosorbide 60 mg daily, metoprolol succinate 25 mg daily -may need to use MAP goal given low diastolic numbers -last GFR 21, consistent with chronic kidney disease stage 4 -given CKD, will not start ACEi/ARB/ARNI/MRA/diuretic.  -+renal bruit, dopplers suggest bilateral renal artery stenosis. Following with nephrology -with low diastolic number, will avoid increasing imdur.   Type II diabetes -last A1c 8.6 -on repaglinide -would typically aim for SGLT2i, but given renal disease this is not an option  Murmur: -louder on exam today, will repeat echo  Cardiac risk counseling and prevention recommendations: -recommend heart healthy/Mediterranean diet, with whole grains, fruits, vegetable, fish, lean meats, nuts, and olive oil. Limit salt. -recommend moderate walking, 3-5 times/week for 30-50 minutes each session. Aim for at least 150 minutes.week. Goal should be pace of 3 miles/hours, or walking 1.5 miles in 30 minutes -recommend avoidance of tobacco products. Avoid excess alcohol. -ASCVD risk score: The ASCVD Risk score Denman George DC Jr., et al., 2013) failed to calculate for the following reasons:   Cannot  find a previous HDL lab   Cannot find a previous total cholesterol lab    Plan for follow up: 3-4 weeks or sooner as needed  High complexity medical decision making, discussed management at home vs. Hospitalization today. Needs to be in close contact with the office, reviewed when to go to ER.  Jodelle Red, MD, PhD, [facility]  [facility]    Medication Adjustments/Labs and Tests Ordered: Current medicines are reviewed at length with the patient today.  Concerns regarding medicines are outlined above.  Orders Placed This Encounter  Procedures  ECHOCARDIOGRAM COMPLETE   Meds ordered this encounter  Medications   DISCONTD: torsemide (DEMADEX) 20 MG tablet    Sig: Take 2 tablets (40 mg total) by mouth as needed.    Dispense:  60 tablet    Refill:  0    Patient Instructions  Medication Instructions:  Take Torsemide 40 mg daily for 3 days, then call our office with an update.    *If you need a refill on your cardiac medications before your next appointment, please call your pharmacy*   Lab Work: None ordered today   Testing/Procedures: Your physician has requested that you have an echocardiogram. Echocardiography is a painless test that uses sound waves to create images of your heart. It provides your doctor with information about the size and shape of your heart and how well your heart's chambers and valves are working. This procedure takes approximately one hour. There are no restrictions for this procedure. [place] [address]    Follow-Up: At [facility], you and your health needs are our priority.  As part of our continuing mission to provide you with exceptional heart care, we have created designated Provider Care Teams.  These Care Teams include your primary Cardiologist (physician) and Advanced Practice Providers (APPs -  Physician Assistants and Nurse Practitioners) who all work together to provide you with the care you need, when you  need it.  We recommend signing up for the patient portal called "MyChart".  Sign up information is provided on this After Visit Summary.  MyChart is used to connect with patients for Virtual Visits (Telemedicine).  Patients are able to view lab/test results, encounter notes, upcoming appointments, etc.  Non-urgent messages can be sent to your provider as well.   To learn more about what you can do with MyChart, go to [email or web address].    Your next appointment:   3-4 week(s) @ [address] [place], [place] [address]   The format for your next appointment:   In Person  Provider:   APP      I,Mathew Stumpf,acting as a scribe for Buford Dresser, MD.,have documented all relevant documentation on the behalf of Buford Dresser, MD,as directed by  Buford Dresser, MD while in the presence of Buford Dresser, MD.  I, Buford Dresser, MD, have reviewed all documentation for this visit. The documentation on 06/18/21 for the exam, diagnosis, procedures, and orders are all accurate and complete.   Signed, Buford Dresser, MD PhD 06/18/2021 9:06 PM    [facility]

## 2021-05-27 ENCOUNTER — Other Ambulatory Visit: Payer: Self-pay

## 2021-05-27 ENCOUNTER — Ambulatory Visit (INDEPENDENT_AMBULATORY_CARE_PROVIDER_SITE_OTHER): Payer: Medicaid Other | Admitting: Cardiology

## 2021-05-27 ENCOUNTER — Encounter: Payer: Self-pay | Admitting: Cardiology

## 2021-05-27 VITALS — BP 118/48 | HR 63 | Ht 64.0 in | Wt 110.4 lb

## 2021-05-27 DIAGNOSIS — N184 Chronic kidney disease, stage 4 (severe): Secondary | ICD-10-CM

## 2021-05-27 DIAGNOSIS — I5033 Acute on chronic diastolic (congestive) heart failure: Secondary | ICD-10-CM | POA: Diagnosis not present

## 2021-05-27 DIAGNOSIS — R0989 Other specified symptoms and signs involving the circulatory and respiratory systems: Secondary | ICD-10-CM

## 2021-05-27 DIAGNOSIS — R011 Cardiac murmur, unspecified: Secondary | ICD-10-CM | POA: Diagnosis not present

## 2021-05-27 DIAGNOSIS — R0609 Other forms of dyspnea: Secondary | ICD-10-CM

## 2021-05-27 DIAGNOSIS — R600 Localized edema: Secondary | ICD-10-CM

## 2021-05-27 DIAGNOSIS — R0600 Dyspnea, unspecified: Secondary | ICD-10-CM

## 2021-05-27 DIAGNOSIS — I10 Essential (primary) hypertension: Secondary | ICD-10-CM

## 2021-05-27 MED ORDER — TORSEMIDE 20 MG PO TABS
40.0000 mg | ORAL_TABLET | ORAL | 0 refills | Status: DC | PRN
  Filled 2021-05-27: qty 60, 30d supply, fill #0

## 2021-05-27 NOTE — Patient Instructions (Signed)
Medication Instructions:  Take Torsemide 40 mg daily for 3 days, then call our office with an update.    *If you need a refill on your cardiac medications before your next appointment, please call your pharmacy*   Lab Work: None ordered today   Testing/Procedures: Your physician has requested that you have an echocardiogram. Echocardiography is a painless test that uses sound waves to create images of your heart. It provides your doctor with information about the size and shape of your heart and how well your heart's chambers and valves are working. This procedure takes approximately one hour. There are no restrictions for this procedure. [place] [address]    Follow-Up: At [facility], you and your health needs are our priority.  As part of our continuing mission to provide you with exceptional heart care, we have created designated Provider Care Teams.  These Care Teams include your primary Cardiologist (physician) and Advanced Practice Providers (APPs -  Physician Assistants and Nurse Practitioners) who all work together to provide you with the care you need, when you need it.  We recommend signing up for the patient portal called "MyChart".  Sign up information is provided on this After Visit Summary.  MyChart is used to connect with patients for Virtual Visits (Telemedicine).  Patients are able to view lab/test results, encounter notes, upcoming appointments, etc.  Non-urgent messages can be sent to your provider as well.   To learn more about what you can do with MyChart, go to [email or web address].    Your next appointment:   3-4 week(s) @ [address] [place], [place] [address]   The format for your next appointment:   In Person  Provider:   APP

## 2021-06-01 ENCOUNTER — Other Ambulatory Visit: Payer: Self-pay

## 2021-06-01 ENCOUNTER — Emergency Department (HOSPITAL_COMMUNITY): Payer: Medicaid Other

## 2021-06-01 ENCOUNTER — Inpatient Hospital Stay (HOSPITAL_COMMUNITY)
Admission: EM | Admit: 2021-06-01 | Discharge: 2021-06-11 | DRG: 682 | Disposition: A | Discharge status: Home / Self Care | Payer: Medicaid Other | Attending: Internal Medicine | Admitting: Internal Medicine

## 2021-06-01 DIAGNOSIS — Z79890 Hormone replacement therapy (postmenopausal): Secondary | ICD-10-CM | POA: Diagnosis not present

## 2021-06-01 DIAGNOSIS — I10 Essential (primary) hypertension: Secondary | ICD-10-CM | POA: Diagnosis present

## 2021-06-01 DIAGNOSIS — R0602 Shortness of breath: Secondary | ICD-10-CM

## 2021-06-01 DIAGNOSIS — E7220 Disorder of urea cycle metabolism, unspecified: Secondary | ICD-10-CM | POA: Diagnosis present

## 2021-06-01 DIAGNOSIS — D631 Anemia in chronic kidney disease: Secondary | ICD-10-CM | POA: Diagnosis present

## 2021-06-01 DIAGNOSIS — E43 Unspecified severe protein-calorie malnutrition: Secondary | ICD-10-CM | POA: Diagnosis present

## 2021-06-01 DIAGNOSIS — R52 Pain, unspecified: Secondary | ICD-10-CM

## 2021-06-01 DIAGNOSIS — N179 Acute kidney failure, unspecified: Principal | ICD-10-CM | POA: Diagnosis present

## 2021-06-01 DIAGNOSIS — Z91018 Allergy to other foods: Secondary | ICD-10-CM

## 2021-06-01 DIAGNOSIS — I509 Heart failure, unspecified: Secondary | ICD-10-CM

## 2021-06-01 DIAGNOSIS — E162 Hypoglycemia, unspecified: Secondary | ICD-10-CM

## 2021-06-01 DIAGNOSIS — J918 Pleural effusion in other conditions classified elsewhere: Secondary | ICD-10-CM | POA: Diagnosis present

## 2021-06-01 DIAGNOSIS — E039 Hypothyroidism, unspecified: Secondary | ICD-10-CM | POA: Diagnosis present

## 2021-06-01 DIAGNOSIS — I130 Hypertensive heart and chronic kidney disease with heart failure and stage 1 through stage 4 chronic kidney disease, or unspecified chronic kidney disease: Secondary | ICD-10-CM | POA: Diagnosis present

## 2021-06-01 DIAGNOSIS — E871 Hypo-osmolality and hyponatremia: Secondary | ICD-10-CM | POA: Diagnosis present

## 2021-06-01 DIAGNOSIS — I701 Atherosclerosis of renal artery: Secondary | ICD-10-CM | POA: Diagnosis present

## 2021-06-01 DIAGNOSIS — B9561 Methicillin susceptible Staphylococcus aureus infection as the cause of diseases classified elsewhere: Secondary | ICD-10-CM | POA: Diagnosis not present

## 2021-06-01 DIAGNOSIS — I5032 Chronic diastolic (congestive) heart failure: Secondary | ICD-10-CM | POA: Diagnosis present

## 2021-06-01 DIAGNOSIS — Z7982 Long term (current) use of aspirin: Secondary | ICD-10-CM

## 2021-06-01 DIAGNOSIS — Z79899 Other long term (current) drug therapy: Secondary | ICD-10-CM | POA: Diagnosis not present

## 2021-06-01 DIAGNOSIS — N184 Chronic kidney disease, stage 4 (severe): Secondary | ICD-10-CM | POA: Diagnosis present

## 2021-06-01 DIAGNOSIS — R7881 Bacteremia: Secondary | ICD-10-CM | POA: Diagnosis present

## 2021-06-01 DIAGNOSIS — E8779 Other fluid overload: Secondary | ICD-10-CM | POA: Diagnosis not present

## 2021-06-01 DIAGNOSIS — I5033 Acute on chronic diastolic (congestive) heart failure: Secondary | ICD-10-CM | POA: Diagnosis present

## 2021-06-01 DIAGNOSIS — E8770 Fluid overload, unspecified: Secondary | ICD-10-CM

## 2021-06-01 DIAGNOSIS — Z20822 Contact with and (suspected) exposure to covid-19: Secondary | ICD-10-CM | POA: Diagnosis present

## 2021-06-01 DIAGNOSIS — E876 Hypokalemia: Secondary | ICD-10-CM | POA: Diagnosis not present

## 2021-06-01 DIAGNOSIS — J9601 Acute respiratory failure with hypoxia: Secondary | ICD-10-CM | POA: Diagnosis present

## 2021-06-01 DIAGNOSIS — G9341 Metabolic encephalopathy: Secondary | ICD-10-CM | POA: Diagnosis present

## 2021-06-01 DIAGNOSIS — Z833 Family history of diabetes mellitus: Secondary | ICD-10-CM

## 2021-06-01 DIAGNOSIS — J9690 Respiratory failure, unspecified, unspecified whether with hypoxia or hypercapnia: Secondary | ICD-10-CM

## 2021-06-01 DIAGNOSIS — N189 Chronic kidney disease, unspecified: Secondary | ICD-10-CM | POA: Diagnosis present

## 2021-06-01 DIAGNOSIS — A4901 Methicillin susceptible Staphylococcus aureus infection, unspecified site: Secondary | ICD-10-CM | POA: Diagnosis present

## 2021-06-01 DIAGNOSIS — E11649 Type 2 diabetes mellitus with hypoglycemia without coma: Secondary | ICD-10-CM | POA: Diagnosis present

## 2021-06-01 DIAGNOSIS — Z885 Allergy status to narcotic agent status: Secondary | ICD-10-CM

## 2021-06-01 DIAGNOSIS — E1122 Type 2 diabetes mellitus with diabetic chronic kidney disease: Secondary | ICD-10-CM | POA: Diagnosis present

## 2021-06-01 DIAGNOSIS — E440 Moderate protein-calorie malnutrition: Secondary | ICD-10-CM | POA: Insufficient documentation

## 2021-06-01 DIAGNOSIS — R7309 Other abnormal glucose: Secondary | ICD-10-CM

## 2021-06-01 DIAGNOSIS — Z8249 Family history of ischemic heart disease and other diseases of the circulatory system: Secondary | ICD-10-CM

## 2021-06-01 DIAGNOSIS — E271 Primary adrenocortical insufficiency: Secondary | ICD-10-CM | POA: Diagnosis present

## 2021-06-01 DIAGNOSIS — E119 Type 2 diabetes mellitus without complications: Secondary | ICD-10-CM

## 2021-06-01 DIAGNOSIS — Z681 Body mass index (BMI) 19 or less, adult: Secondary | ICD-10-CM

## 2021-06-01 DIAGNOSIS — Z83438 Family history of other disorder of lipoprotein metabolism and other lipidemia: Secondary | ICD-10-CM

## 2021-06-01 DIAGNOSIS — J90 Pleural effusion, not elsewhere classified: Secondary | ICD-10-CM

## 2021-06-01 LAB — CBC WITH DIFFERENTIAL/PLATELET
Abs Immature Granulocytes: 0.01 10*3/uL (ref 0.00–0.07)
Basophils Absolute: 0.1 10*3/uL (ref 0.0–0.1)
Basophils Relative: 1 %
Eosinophils Absolute: 0.4 10*3/uL (ref 0.0–0.5)
Eosinophils Relative: 5 %
HCT: 25 % — ABNORMAL LOW (ref 36.0–46.0)
Hemoglobin: 7.6 g/dL — ABNORMAL LOW (ref 12.0–15.0)
Immature Granulocytes: 0 %
Lymphocytes Relative: 20 %
Lymphs Abs: 1.4 10*3/uL (ref 0.7–4.0)
MCH: 30.2 pg (ref 26.0–34.0)
MCHC: 30.4 g/dL (ref 30.0–36.0)
MCV: 99.2 fL (ref 80.0–100.0)
Monocytes Absolute: 0.5 10*3/uL (ref 0.1–1.0)
Monocytes Relative: 7 %
Neutro Abs: 4.8 10*3/uL (ref 1.7–7.7)
Neutrophils Relative %: 67 %
Platelets: 185 10*3/uL (ref 150–400)
RBC: 2.52 MIL/uL — ABNORMAL LOW (ref 3.87–5.11)
RDW: 15 % (ref 11.5–15.5)
WBC: 7.1 10*3/uL (ref 4.0–10.5)
nRBC: 0 % (ref 0.0–0.2)

## 2021-06-01 LAB — COMPREHENSIVE METABOLIC PANEL
ALT: 12 U/L (ref 0–44)
AST: 27 U/L (ref 15–41)
Albumin: 3.4 g/dL — ABNORMAL LOW (ref 3.5–5.0)
Alkaline Phosphatase: 127 U/L — ABNORMAL HIGH (ref 38–126)
Anion gap: 10 (ref 5–15)
BUN: 43 mg/dL — ABNORMAL HIGH (ref 8–23)
CO2: 21 mmol/L — ABNORMAL LOW (ref 22–32)
Calcium: 8.8 mg/dL — ABNORMAL LOW (ref 8.9–10.3)
Chloride: 100 mmol/L (ref 98–111)
Creatinine, Ser: 4.06 mg/dL — ABNORMAL HIGH (ref 0.44–1.00)
GFR, Estimated: 12 mL/min — ABNORMAL LOW (ref >60)
Glucose, Bld: 54 mg/dL — ABNORMAL LOW (ref 70–99)
Potassium: 4.3 mmol/L (ref 3.5–5.1)
Sodium: 131 mmol/L — ABNORMAL LOW (ref 135–145)
Total Bilirubin: 0.9 mg/dL (ref 0.3–1.2)
Total Protein: 7 g/dL (ref 6.5–8.1)

## 2021-06-01 LAB — CBG MONITORING, ED
Glucose-Capillary: 39 mg/dL — CL (ref 70–99)
Glucose-Capillary: 225 mg/dL — ABNORMAL HIGH (ref 70–99)
Glucose-Capillary: 211 mg/dL — ABNORMAL HIGH (ref 70–99)
Glucose-Capillary: 124 mg/dL — ABNORMAL HIGH (ref 70–99)
Glucose-Capillary: 105 mg/dL — ABNORMAL HIGH (ref 70–99)
Glucose-Capillary: 77 mg/dL (ref 70–99)
Glucose-Capillary: 78 mg/dL (ref 70–99)
Glucose-Capillary: 144 mg/dL — ABNORMAL HIGH (ref 70–99)
Glucose-Capillary: 114 mg/dL — ABNORMAL HIGH (ref 70–99)
Glucose-Capillary: 114 mg/dL — ABNORMAL HIGH (ref 70–99)
Glucose-Capillary: 98 mg/dL (ref 70–99)
Glucose-Capillary: 77 mg/dL (ref 70–99)
Glucose-Capillary: 85 mg/dL (ref 70–99)
Glucose-Capillary: 70 mg/dL (ref 70–99)

## 2021-06-01 LAB — TROPONIN I (HIGH SENSITIVITY)
Troponin I (High Sensitivity): 10 ng/L (ref <18)
Troponin I (High Sensitivity): 9 ng/L (ref <18)

## 2021-06-01 LAB — CORTISOL: Cortisol, Plasma: 2.5 ug/dL

## 2021-06-01 LAB — SARS CORONAVIRUS 2 (TAT 6-24 HRS): SARS Coronavirus 2: NEGATIVE

## 2021-06-01 LAB — BRAIN NATRIURETIC PEPTIDE: B Natriuretic Peptide: 1106.6 pg/mL — ABNORMAL HIGH (ref 0.0–100.0)

## 2021-06-01 LAB — GLUCOSE, CAPILLARY: Glucose-Capillary: 106 mg/dL — ABNORMAL HIGH (ref 70–99)

## 2021-06-01 LAB — LACTIC ACID, PLASMA: Lactic Acid, Venous: 1.1 mmol/L (ref 0.5–1.9)

## 2021-06-01 MED ORDER — POLYETHYLENE GLYCOL 3350 17 G PO PACK
17.0000 g | PACK | Freq: Every day | ORAL | Status: DC
  Administered 2021-06-01 – 2021-06-07 (×4): 17 g via ORAL
  Filled 2021-06-01 (×6): qty 1

## 2021-06-01 MED ORDER — DEXTROSE 10 % IV SOLN: INTRAVENOUS | Status: DC

## 2021-06-01 MED ORDER — ATORVASTATIN CALCIUM 10 MG PO TABS
20.0000 mg | ORAL_TABLET | Freq: Every day | ORAL | Status: DC
  Administered 2021-06-01 – 2021-06-11 (×10): 20 mg via ORAL
  Filled 2021-06-01 (×10): qty 2

## 2021-06-01 MED ORDER — HYDRALAZINE HCL 50 MG PO TABS
50.0000 mg | ORAL_TABLET | Freq: Three times a day (TID) | ORAL | Status: DC
  Administered 2021-06-01: 50 mg via ORAL
  Filled 2021-06-01 (×2): qty 1

## 2021-06-01 MED ORDER — CALCIUM CARBONATE-VITAMIN D 500-200 MG-UNIT PO TABS
2.0000 | ORAL_TABLET | Freq: Every day | ORAL | Status: DC
  Administered 2021-06-03 – 2021-06-11 (×9): 2 via ORAL
  Filled 2021-06-01 (×11): qty 2

## 2021-06-01 MED ORDER — COSYNTROPIN 0.25 MG IJ SOLR
0.2500 mg | Freq: Once | INTRAMUSCULAR | Status: AC
  Administered 2021-06-02: 0.25 mg via INTRAVENOUS
  Filled 2021-06-01: qty 0.25

## 2021-06-01 MED ORDER — TRAMADOL HCL 50 MG PO TABS
50.0000 mg | ORAL_TABLET | Freq: Two times a day (BID) | ORAL | Status: DC | PRN
  Administered 2021-06-01 – 2021-06-08 (×7): 50 mg via ORAL
  Filled 2021-06-01 (×8): qty 1

## 2021-06-01 MED ORDER — FLUTICASONE PROPIONATE 50 MCG/ACT NA SUSP
2.0000 | Freq: Every day | NASAL | Status: DC
  Administered 2021-06-04 – 2021-06-11 (×2): 2 via NASAL
  Filled 2021-06-01: qty 16

## 2021-06-01 MED ORDER — FUROSEMIDE 10 MG/ML IJ SOLN
40.0000 mg | Freq: Once | INTRAMUSCULAR | Status: AC
  Administered 2021-06-01: 40 mg via INTRAVENOUS
  Filled 2021-06-01: qty 4

## 2021-06-01 MED ORDER — ISOSORBIDE MONONITRATE ER 60 MG PO TB24
60.0000 mg | ORAL_TABLET | Freq: Every day | ORAL | Status: DC
  Administered 2021-06-01 – 2021-06-11 (×10): 60 mg via ORAL
  Filled 2021-06-01 (×2): qty 1
  Filled 2021-06-01: qty 2
  Filled 2021-06-01 (×7): qty 1

## 2021-06-01 MED ORDER — FUROSEMIDE 10 MG/ML IJ SOLN
80.0000 mg | Freq: Once | INTRAMUSCULAR | Status: AC
  Administered 2021-06-01: 80 mg via INTRAVENOUS
  Filled 2021-06-01: qty 8

## 2021-06-01 MED ORDER — VITAMIN B-12 1000 MCG PO TABS
1000.0000 ug | ORAL_TABLET | Freq: Every day | ORAL | Status: DC
  Administered 2021-06-01 – 2021-06-11 (×10): 1000 ug via ORAL
  Filled 2021-06-01 (×10): qty 1

## 2021-06-01 MED ORDER — SODIUM CHLORIDE 0.9% FLUSH
3.0000 mL | INTRAVENOUS | Status: DC | PRN
  Administered 2021-06-07: 3 mL via INTRAVENOUS

## 2021-06-01 MED ORDER — ONDANSETRON HCL 4 MG PO TABS: 4.0000 mg | ORAL_TABLET | Freq: Four times a day (QID) | ORAL | Status: DC | PRN

## 2021-06-01 MED ORDER — METOPROLOL SUCCINATE ER 25 MG PO TB24
25.0000 mg | ORAL_TABLET | Freq: Every day | ORAL | Status: DC
  Administered 2021-06-01 – 2021-06-08 (×5): 25 mg via ORAL
  Filled 2021-06-01 (×8): qty 1

## 2021-06-01 MED ORDER — HEPARIN SODIUM (PORCINE) 5000 UNIT/ML IJ SOLN
5000.0000 [IU] | Freq: Three times a day (TID) | INTRAMUSCULAR | Status: DC
  Administered 2021-06-01 – 2021-06-11 (×29): 5000 [IU] via SUBCUTANEOUS
  Filled 2021-06-01 (×29): qty 1

## 2021-06-01 MED ORDER — LEVOTHYROXINE SODIUM 75 MCG PO TABS
150.0000 ug | ORAL_TABLET | Freq: Every day | ORAL | Status: DC
  Administered 2021-06-01 – 2021-06-11 (×11): 150 ug via ORAL
  Filled 2021-06-01 (×11): qty 2

## 2021-06-01 MED ORDER — ACETAMINOPHEN 325 MG PO TABS: 650.0000 mg | ORAL_TABLET | Freq: Four times a day (QID) | ORAL | Status: DC | PRN

## 2021-06-01 MED ORDER — ASPIRIN 81 MG PO CHEW
81.0000 mg | CHEWABLE_TABLET | Freq: Every day | ORAL | Status: DC
  Administered 2021-06-01 – 2021-06-11 (×10): 81 mg via ORAL
  Filled 2021-06-01 (×10): qty 1

## 2021-06-01 MED ORDER — SODIUM CHLORIDE 0.9 % IV SOLN: 250.0000 mL | INTRAVENOUS | Status: DC | PRN

## 2021-06-01 MED ORDER — ENOXAPARIN SODIUM 40 MG/0.4ML IJ SOSY: 40.0000 mg | PREFILLED_SYRINGE | INTRAMUSCULAR | Status: DC

## 2021-06-01 MED ORDER — AMLODIPINE BESYLATE 10 MG PO TABS
10.0000 mg | ORAL_TABLET | Freq: Every day | ORAL | Status: DC
  Administered 2021-06-01 – 2021-06-05 (×4): 10 mg via ORAL
  Filled 2021-06-01 (×2): qty 1
  Filled 2021-06-01: qty 2
  Filled 2021-06-01: qty 1

## 2021-06-01 MED ORDER — CALCIUM 1200 1200-1000 MG-UNIT PO CHEW: CHEWABLE_TABLET | Freq: Every day | ORAL | Status: DC

## 2021-06-01 MED ORDER — ADULT MULTIVITAMIN W/MINERALS CH
1.0000 | ORAL_TABLET | Freq: Every day | ORAL | Status: DC
  Administered 2021-06-01 – 2021-06-11 (×10): 1 via ORAL
  Filled 2021-06-01 (×10): qty 1

## 2021-06-01 MED ORDER — DEXTROSE 50 % IV SOLN
1.0000 | Freq: Once | INTRAVENOUS | Status: AC
  Administered 2021-06-01: 50 mL via INTRAVENOUS
  Filled 2021-06-01: qty 50

## 2021-06-01 MED ORDER — PANTOPRAZOLE SODIUM 40 MG PO TBEC
80.0000 mg | DELAYED_RELEASE_TABLET | Freq: Every day | ORAL | Status: DC
  Administered 2021-06-01 – 2021-06-11 (×10): 80 mg via ORAL
  Filled 2021-06-01 (×10): qty 2

## 2021-06-01 MED ORDER — FUROSEMIDE 10 MG/ML IJ SOLN
80.0000 mg | Freq: Every day | INTRAMUSCULAR | Status: DC
  Administered 2021-06-02: 80 mg via INTRAVENOUS
  Filled 2021-06-01: qty 8

## 2021-06-01 MED ORDER — SODIUM CHLORIDE 0.9% FLUSH
3.0000 mL | Freq: Two times a day (BID) | INTRAVENOUS | Status: DC
  Administered 2021-06-01 – 2021-06-11 (×15): 3 mL via INTRAVENOUS

## 2021-06-01 MED ORDER — HYDRALAZINE HCL 25 MG PO TABS
100.0000 mg | ORAL_TABLET | Freq: Three times a day (TID) | ORAL | Status: DC
  Administered 2021-06-01: 100 mg via ORAL
  Filled 2021-06-01: qty 4

## 2021-06-01 MED ORDER — ONDANSETRON HCL 4 MG/2ML IJ SOLN
4.0000 mg | Freq: Four times a day (QID) | INTRAMUSCULAR | Status: DC | PRN
  Administered 2021-06-01 – 2021-06-09 (×5): 4 mg via INTRAVENOUS
  Filled 2021-06-01 (×5): qty 2

## 2021-06-01 NOTE — ED Notes (Signed)
CBG 102 

## 2021-06-01 NOTE — ED Triage Notes (Signed)
Pt brought in by EMS for altered mental status and low blood sugar. Per EMS, initial blood sugar was 30. Pt received D50 and last blood sugar was 78.

## 2021-06-01 NOTE — ED Notes (Signed)
Breakfast Ordered 

## 2021-06-01 NOTE — ED Provider Notes (Signed)
[name] EMERGENCY DEPARTMENT Provider Note  CSN: [id number] Arrival date & time: 06/01/21 0033  Chief Complaint(s) Hypoglycemia ED Triage Notes Suzanne Naegeli, RN (Registered Nurse) . [facility] . [facility] Date of Service: 06/01/2021 12:36 AM . . Signed   Pt brought in by EMS for altered mental status and low blood sugar. Per EMS, initial blood sugar was 30. Pt received D50 and last blood sugar was 78.     HPI [name] is a 60 y.o. female with a past medical history listed below here for altered mental status and found to be hypoglycemic after EMS got called out.  They noted CBGs in the 57s.  Mental status improved after D50.  Patient reports that she has been taking her home medications as prescribed.  States that she takes twice daily Prandin.  Reports that she ate a small bowl of lo mein as well as watermelon tonight.  Reports that she has had little appetite over the past several days.  Also reports gradual onset, yet worsening shortness of breath and dyspnea on exertion for the past several days with fatigue.  Patient reports that her shortness of breath was so severe she required oxygen from her sons oxygen supply.  She reports that they obtain a saturation in the 70s. She endorses dry cough.  No fevers or chills.  No chest pain.  No abdominal pain.  No urinary symptoms.  HPI  Past Medical History Past Medical History:  Diagnosis Date  . Adrenal insufficiency, primary ([place])   . Anemia 2018   Hgb 6.8 early 11/2020.  PRBC x 1.  06/2017 PRBC x 1.    Marland Kitchen CHF (congestive heart failure) ([place]) [id number]   Diastolic  . CKD (chronic kidney disease) stage 4, GFR 15-29 ml/min (HCC) 04/2021   Stage 4 as of late May 2022  . Diabetes mellitus without complication ([place])   . Hypertension   . Hypothyroidism    Patient Active Problem List   Diagnosis Date Noted  . Dizziness 05/13/2021  . 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 Hypokalemia 05/12/2021  . Fecal impaction  ([name]) 05/12/2021  . Anemia due to chronic kidney disease 05/12/2021  . Adrenal insufficiency (Suzanne's disease) ([place]) 05/12/2021  . CKD (chronic kidney disease) stage 4, GFR 15-29 ml/min (HCC) 11/21/2020  . HTN (hypertension) 11/21/2020  . New onset of congestive heart failure ([place]) 11/21/2020  . Adrenal insufficiency ([place]) 03/27/2018  . Dehydration 12/09/2017  . Abdominal pain, bilateral lower quadrant 12/09/2017  . Hypoglycemia 07/08/2017  . Hypoglycemia associated with diabetes ([place]) 05/10/2017  . Malnutrition of moderate degree 05/10/2017   Home Medication(s) Prior to Admission medications   Medication Sig Start Date End Date Taking? Authorizing Provider  acetaminophen (TYLENOL) 325 MG tablet Take 2 tablets (650 mg total) by mouth every 6 (six) hours as needed for mild pain. 03/31/18   Dixie Dials, MD  amLODipine (NORVASC) 10 MG tablet TAKE 1 TABLET (10 MG TOTAL) BY MOUTH DAILY. Patient taking differently: Take 10 mg by mouth daily. 04/08/21 04/08/22  Ladell Pier, MD  aspirin 81 MG chewable tablet Chew 81 mg by mouth daily.    [provider]  atorvastatin (LIPITOR) 20 MG tablet TAKE 1 TABLET (20 MG TOTAL) BY MOUTH DAILY. Patient taking differently: Take 20 mg by mouth daily. 12/02/20 12/02/21  Charlott Rakes, MD  Calcium Carbonate-Vit D-Min (CALCIUM 1200 PO) Take 1,200 mg by mouth daily.    [provider]  fluticasone (FLONASE) 50 MCG/ACT nasal spray PLACE  2 SPRAYS INTO BOTH NOSTRILS DAILY. 10/29/20 10/29/21  Charlott Rakes, MD  hydrALAZINE (APRESOLINE) 100 MG tablet Take 1 tablet (100 mg total) by mouth 3 (three) times daily. 03/30/21   Ladell Pier, MD  isosorbide mononitrate (IMDUR) 60 MG 24 hr tablet TAKE 1 TABLET BY MOUTH DAILY Patient taking differently: Take 60 mg by mouth daily. 03/04/21 03/04/22  Justin Mend, MD  levothyroxine (SYNTHROID) 150 MCG tablet TAKE 1 TABLET (150 MCG TOTAL) BY MOUTH DAILY BEFORE BREAKFAST. Patient taking  differently: Take 150 mcg by mouth daily before breakfast. 10/29/20 10/29/21  Charlott Rakes, MD  metoprolol succinate (TOPROL-XL) 25 MG 24 hr tablet Take 1 tablet (25 mg total) by mouth daily. 03/30/21   Ladell Pier, MD  Multiple Vitamin (MULTIVITAMIN WITH MINERALS) TABS tablet Take 1 tablet by mouth daily.    [provider]  omeprazole (PRILOSEC) 40 MG capsule TAKE 1 CAPSULE (40 MG TOTAL) BY MOUTH DAILY. Patient taking differently: Take 40 mg by mouth daily. 05/06/21 05/06/22  Charlott Rakes, MD  ondansetron (ZOFRAN) 4 MG tablet Take 1 tablet (4 mg total) by mouth daily as needed for nausea or vomiting. 05/18/21   Ladell Pier, MD  polyethylene glycol (MIRALAX / GLYCOLAX) 17 g packet Take 17 g by mouth daily. 05/17/21   Ghimire, Henreitta Leber, MD  repaglinide (PRANDIN) 0.5 MG tablet TAKE 1 TABLET (0.5 MG TOTAL) BY MOUTH 2 (TWO) TIMES DAILY BEFORE A MEAL. Patient taking differently: Take 0.5 mg by mouth 2 (two) times daily before a meal. 02/15/21 02/15/22  Renato Shin, MD  torsemide (DEMADEX) 20 MG tablet Take 2 tablets (40 mg total) by mouth as needed. Patient taking differently: Take 40 mg by mouth as needed (fluid). 05/27/21   Buford Dresser, MD  traMADol (ULTRAM) 50 MG tablet Take 1 tablet (50 mg total) by mouth every 12 (twelve) hours as needed. 05/21/21   Ladell Pier, MD  vitamin B-12 (CYANOCOBALAMIN) 1000 MCG tablet Take 1 tablet (1,000 mcg total) by mouth daily. 05/16/21   Ghimire, Henreitta Leber, MD                                                                                                                                    Past Surgical History Past Surgical History:  Procedure Laterality Date  . CESAREAN SECTION     Family History Family History  Problem Relation Age of Onset  . Diabetes Mother   . Hyperlipidemia Father   . Hypertension Father   . Adrenal disorder Neg Hx     Social History Social History   Tobacco Use  . Smoking status: Never  .  Smokeless tobacco: Never  Substance Use Topics  . Alcohol use: No  . Drug use: No   Allergies Percocet [oxycodone-acetaminophen]  Review of Systems Review of Systems All other systems are reviewed and are negative for acute change except as noted in the HPI  Physical Exam Vital Signs  I have reviewed the triage vital signs BP (!) 141/48   Pulse (!) 58   Temp (!) 97.4 F (36.3 C) (Oral)   Resp 20   SpO2 90%   Physical Exam Vitals reviewed.  Constitutional:      General: She is not in acute distress.    Appearance: She is well-developed. She is not diaphoretic.  HENT:     Head: Normocephalic and atraumatic.     Nose: Nose normal.  Eyes:     General: No scleral icterus.       Right eye: No discharge.        Left eye: No discharge.     Conjunctiva/sclera: Conjunctivae normal.     Pupils: Pupils are equal, round, and reactive to light.  Cardiovascular:     Rate and Rhythm: Normal rate and regular rhythm.     Heart sounds: No murmur heard.   No friction rub. No gallop.  Pulmonary:     Effort: Pulmonary effort is normal. Tachypnea present. No respiratory distress.     Breath sounds: No stridor. Examination of the right-middle field reveals rales. Examination of the left-middle field reveals rales. Examination of the right-lower field reveals rales. Examination of the left-lower field reveals rales. Rales (fine) present.  Abdominal:     General: There is no distension.     Palpations: Abdomen is soft.     Tenderness: There is no abdominal tenderness.  Musculoskeletal:        General: No tenderness.     Cervical back: Normal range of motion and neck supple.  Skin:    General: Skin is warm and dry.     Findings: No erythema or rash.  Neurological:     Mental Status: She is alert and oriented to person, place, and time.    ED Results and Treatments Labs (all labs ordered are listed, but only abnormal results are displayed) Labs Reviewed  COMPREHENSIVE METABOLIC PANEL  - Abnormal; Notable for the following components:      Result Value   Sodium 131 (*)    CO2 21 (*)    Glucose, Bld 54 (*)    BUN 43 (*)    Creatinine, Ser 4.06 (*)    Calcium 8.8 (*)    Albumin 3.4 (*)    Alkaline Phosphatase 127 (*)    GFR, Estimated 12 (*)    All other components within normal limits  BRAIN NATRIURETIC PEPTIDE - Abnormal; Notable for the following components:   B Natriuretic Peptide 1,106.6 (*)    All other components within normal limits  CBG MONITORING, ED - Abnormal; Notable for the following components:   Glucose-Capillary 39 (*)    All other components within normal limits  CBG MONITORING, ED - Abnormal; Notable for the following components:   Glucose-Capillary 225 (*)    All other components within normal limits  SARS CORONAVIRUS 2 (TAT 6-24 HRS)  CORTISOL  LACTIC ACID, PLASMA  URINALYSIS, ROUTINE W REFLEX MICROSCOPIC  CBC WITH DIFFERENTIAL/PLATELET  CBG MONITORING, ED  TROPONIN I (HIGH SENSITIVITY)  TROPONIN I (HIGH SENSITIVITY)  EKG  EKG Interpretation  Date/Time:  Monday June 01 2021 01:30:42 EDT Ventricular Rate:  60 PR Interval:  178 QRS Duration: 132 QT Interval:  421 QTC Calculation: 421 R Axis:   44 Text Interpretation: Sinus rhythm Nonspecific intraventricular conduction delay Anteroseptal infarct, old Nonspecific T abnormalities, lateral leads V5 baseline wandering Otherwise no significant change Confirmed by Suzanne Lank [id number]) on 06/01/2021 1:57:09 AM        Radiology DG Chest Port 1 View  Result Date: 06/01/2021 CLINICAL DATA:  Shortness of breath. Altered mental status. Low blood sugar. EXAM: PORTABLE CHEST 1 VIEW COMPARISON:  05/12/2021 FINDINGS: Cardiac enlargement. Bilateral perihilar infiltration could represent pneumonia or edema. Small bilateral pleural effusions. Mediastinal contours appear intact. No  pneumothorax. Calcification of the aorta. IMPRESSION: Cardiac enlargement with perihilar infiltrates and bilateral pleural effusions. Electronically Signed   By: Lucienne Capers M.D.   On: 06/01/2021 01:23    Pertinent labs & imaging results that were available during my care of the patient were reviewed by me and considered in my medical decision making (see chart for details).  Medications Ordered in ED Medications  sodium chloride flush (NS) 0.9 % injection 3 mL (3 mLs Intravenous Not Given 06/01/21 0202)  sodium chloride flush (NS) 0.9 % injection 3 mL (has no administration in time range)  0.9 %  sodium chloride infusion (has no administration in time range)  dextrose 10 % infusion ( Intravenous New Bag/Given 06/01/21 0200)  dextrose 50 % solution 50 mL (50 mLs Intravenous Given 06/01/21 0153)  furosemide (LASIX) injection 40 mg (40 mg Intravenous Given 06/01/21 0321)                                                                                                                                    Procedures .1-3 Lead EKG Interpretation  Date/Time: 06/01/2021 3:25 AM Performed by: Fatima Blank, MD Authorized by: Fatima Blank, MD     Interpretation: normal     ECG rate:  63   ECG rate assessment: normal     Rhythm: sinus rhythm     Conduction: normal   .Critical Care  Date/Time: 06/01/2021 3:25 AM Performed by: Fatima Blank, MD Authorized by: Fatima Blank, MD   Critical care provider statement:    Critical care time (minutes):  45   Critical care was necessary to treat or prevent imminent or life-threatening deterioration of the following conditions:  Cardiac failure and metabolic crisis   Critical care was time spent personally by me on the following activities:  Discussions with consultants, evaluation of patient's response to treatment, examination of patient, ordering and performing treatments and interventions, ordering and review of  laboratory studies, ordering and review of radiographic studies, pulse oximetry, re-evaluation of patient's condition, obtaining history from patient or surrogate and review of old charts   Care discussed with: admitting provider    (including critical care time)  Medical  Decision Making / ED Course I have reviewed the nursing notes for this encounter and the patient's prior records (if available in EHR or on provided paperwork).   Dnia Streight was evaluated in Emergency Department on 06/01/2021 for the symptoms described in the history of present illness. She was evaluated in the context of the global COVID-19 pandemic, which necessitated consideration that the patient might be at risk for infection with the SARS-CoV-2 virus that causes COVID-19. Institutional protocols and algorithms that pertain to the evaluation of patients at risk for COVID-19 are in a state of rapid change based on information released by regulatory bodies including the CDC and federal and state organizations. These policies and algorithms were followed during the patient's care in the ED.  Patient presented with altered mental status found to be hypoglycemic requiring D50 by EMS.  Repeat CBG in 70s. On arrival here patient CBG in the 30s requiring additional dose of D50.  Patient placed on D10 drip.  Patient reported several days of gradually worsening shortness of breath.  She is got a history of CKD and diastolic heart failure.  Lungs with bibasilar fine rales concerning for pulmonary edema. Chest x-ray confirmed. Blood work consistent with CHF exacerbation. Patient given 40 mg of IV Lasix. Patient is on 3 L nasal cannula satting approximately 94%  Will admit patient to medicine for further work-up and management.        Final Clinical Impression(s) / ED Diagnoses Final diagnoses:  None      This chart was dictated using voice recognition software.  Despite best efforts to proofread,  errors can occur  which can change the documentation meaning.    Fatima Blank, MD 06/01/21 [phone]

## 2021-06-01 NOTE — ED Notes (Signed)
CBG 144 

## 2021-06-01 NOTE — ED Notes (Signed)
CBG- 77

## 2021-06-01 NOTE — ED Notes (Signed)
CBG 78. 

## 2021-06-01 NOTE — Consult Note (Signed)
[facility]  HISTORY AND PHYSICAL  [name] is an 60 y.o. female.    Chief Complaint: SOB and hypoglycemia  HPI: Pt is a 57F with a PMH sig for Adrenal insufficiency, CKD IV, diastolic CHF, and bilateral RAS who is now seen in consultation at the request of Dr. Roderic Palau for eval and recs re: AKI on CKD.  Pt recently hospitalized 5/28-5/29 for severe constipation.  Has been doing relatively well since then, however was found unresponsive at home day of admission and was found to have blood sugar of 30.  Given D50 by EMS and brough to ED.  Was noted to have swelling and hypoxia as well.  Given 40 and then 80 of IV Lasix.  Cr 2.5 as of late May, now up to 4.0.  In this setting we are asked to see.    Pt curled up in bed.  Says that her stomach hurts.  She can't really elaborate on events leading up to her hospitalization.   She has known bilateral renal artery stenosis.    PMH: Past Medical History:  Diagnosis Date   Adrenal insufficiency, primary ([place])    Anemia 2018   Hgb 6.8 early 11/2020.  PRBC x 1.  06/2017 PRBC x 1.     CHF (congestive heart failure) ([place]) 99991111   Diastolic   CKD (chronic kidney disease) stage 4, GFR 15-29 ml/min ([place]) 04/2021   Stage 4 as of late May 2022   Diabetes mellitus without complication ([place])    Hypertension    Hypothyroidism    PSH: Past Surgical History:  Procedure Laterality Date   CESAREAN SECTION      Past Medical History:  Diagnosis Date   Adrenal insufficiency, primary ([place])    Anemia 2018   Hgb 6.8 early 11/2020.  PRBC x 1.  06/2017 PRBC x 1.     CHF (congestive heart failure) ([place]) 99991111   Diastolic   CKD (chronic kidney disease) stage 4, GFR 15-29 ml/min ([facility]) 04/2021   Stage 4 as of late May 2022   Diabetes mellitus without complication ([place])    Hypertension    Hypothyroidism     Medications:  Prior to Admission: (Not in a hospital admission)  Scheduled:  amLODipine  10 mg Oral Daily   aspirin  81 mg Oral  Daily   atorvastatin  20 mg Oral Daily   calcium-vitamin D  2 tablet Oral Q breakfast   [START ON 06/02/2021] cosyntropin  0.25 mg Intravenous Once   fluticasone  2 spray Each Nare Daily   heparin  5,000 Units Subcutaneous Q8H   hydrALAZINE  50 mg Oral TID   isosorbide mononitrate  60 mg Oral Daily   levothyroxine  150 mcg Oral Q0600   metoprolol succinate  25 mg Oral Daily   multivitamin with minerals  1 tablet Oral Daily   pantoprazole  80 mg Oral Daily   polyethylene glycol  17 g Oral Daily   sodium chloride flush  3 mL Intravenous Q12H   vitamin B-12  1,000 mcg Oral Daily    (Not in a hospital admission)   ALLERGIES:   Allergies  Allergen Reactions   Beef-Derived Products     RELIGOUS  REASONS   Pork-Derived Products     RELIGIOUS REASONS   Percocet [Oxycodone-Acetaminophen] Rash    FAM HX: Family History  Problem Relation Age of Onset   Diabetes Mother    Hyperlipidemia Father    Hypertension Father    Adrenal  disorder Neg Hx     Social History:   reports that she has never smoked. She has never used smokeless tobacco. She reports that she does not drink alcohol and does not use drugs.  ROS: ROS: all other systems reviewed and are negative except as per HPI  Blood pressure (!) 124/56, pulse (!) 59, temperature 98.7 F (37.1 C), temperature source Oral, resp. rate 18, SpO2 90 %. PHYSICAL EXAM: Physical Exam GEN: curled up in bed, NAD, sleeping and arousable HEENT EOMI PERRL NECK + JVD PULM muffled breath sounds bilaterally CV RRR ABD soft, mildly diffusely tender EXT no LE edema    Results for orders placed or performed during the hospital encounter of 06/01/21 (from the past 48 hour(s))  SARS CORONAVIRUS 2 (TAT 6-24 HRS) Nasopharyngeal Nasopharyngeal Swab     Status: None   Collection Time: 06/01/21  1:09 AM   Specimen: Nasopharyngeal Swab  Result Value Ref Range   SARS Coronavirus 2 NEGATIVE NEGATIVE    Comment: (NOTE) SARS-CoV-2 target nucleic  acids are NOT DETECTED.  The SARS-CoV-2 RNA is generally detectable in upper and lower respiratory specimens during the acute phase of infection. Negative results do not preclude SARS-CoV-2 infection, do not rule out co-infections with other pathogens, and should not be used as the sole basis for treatment or other patient management decisions. Negative results must be combined with clinical observations, patient history, and epidemiological information. The expected result is Negative.  Fact Sheet for Patients: SugarRoll.be  Fact Sheet for Healthcare Providers: https://www.woods-mathews.com/  This test is not yet approved or cleared by the [place] FDA and  has been authorized for detection and/or diagnosis of SARS-CoV-2 by FDA under an Emergency Use Authorization (EUA). This EUA will remain  in effect (meaning this test can be used) for the duration of the COVID-19 declaration under Se ction 564(b)(1) of the Act, 21 U.S.C. section 360bbb-3(b)(1), unless the authorization is terminated or revoked sooner.  Performed at [facility], [place] [address] [place], [place] [address]   CBG monitoring, ED (now and then every hour for 3 hours)     Status: Abnormal   Collection Time: 06/01/21  1:37 AM  Result Value Ref Range   Glucose-Capillary 39 (LL) 70 - 99 mg/dL    Comment: Glucose reference range applies only to samples taken after fasting for at least 8 hours.   Comment 1 Notify RN   Comprehensive metabolic panel     Status: Abnormal   Collection Time: 06/01/21  1:45 AM  Result Value Ref Range   Sodium 131 (L) 135 - 145 mmol/L   Potassium 4.3 3.5 - 5.1 mmol/L   Chloride 100 98 - 111 mmol/L   CO2 21 (L) 22 - 32 mmol/L   Glucose, Bld 54 (L) 70 - 99 mg/dL    Comment: Glucose reference range applies only to samples taken after fasting for at least 8 hours.   BUN 43 (H) 8 - 23 mg/dL   Creatinine, Ser 4.06 (H) 0.44 - 1.00 mg/dL    Calcium 8.8 (L) 8.9 - 10.3 mg/dL   Total Protein 7.0 6.5 - 8.1 g/dL   Albumin 3.4 (L) 3.5 - 5.0 g/dL   AST 27 15 - 41 U/L   ALT 12 0 - 44 U/L   Alkaline Phosphatase 127 (H) 38 - 126 U/L   Total Bilirubin 0.9 0.3 - 1.2 mg/dL   GFR, Estimated 12 (L) >60 mL/min    Comment: (NOTE) Calculated using the CKD-EPI  Creatinine Equation (2021)    Anion gap 10 5 - 15    Comment: Performed at [facility], [place] [address], [place], [place] [address]  Cortisol, Random     Status: None   Collection Time: 06/01/21  1:45 AM  Result Value Ref Range   Cortisol, Plasma 2.5 ug/dL    Comment: (NOTE) AM    6.7 - 22.6 ug/dL PM   <10.0       ug/dL Performed at [facility] [address], [place], [place] [address]   Lactic acid, plasma     Status: None   Collection Time: 06/01/21  1:45 AM  Result Value Ref Range   Lactic Acid, Venous 1.1 0.5 - 1.9 mmol/L    Comment: Performed at [facility] [address] [place], [place] [address]  Troponin I (High Sensitivity)     Status: None   Collection Time: 06/01/21  1:45 AM  Result Value Ref Range   Troponin I (High Sensitivity) 10 <18 ng/L    Comment: (NOTE) Elevated high sensitivity troponin I (hsTnI) values and significant  changes across serial measurements may suggest ACS but many other  chronic and acute conditions are known to elevate hsTnI results.  Refer to the "Links" section for chest pain algorithms and additional  guidance. Performed at [facility], [place] [address], [place], [place] [address]   Brain natriuretic peptide     Status: Abnormal   Collection Time: 06/01/21  1:45 AM  Result Value Ref Range   B Natriuretic Peptide 1,106.6 (H) 0.0 - 100.0 pg/mL    Comment: Performed at [facility] [address], [place], [place] [address]  Troponin I (High Sensitivity)     Status: None   Collection Time: 06/01/21  3:08 AM  Result Value Ref Range   Troponin I (High Sensitivity) 9 <18 ng/L    Comment:  (NOTE) Elevated high sensitivity troponin I (hsTnI) values and significant  changes across serial measurements may suggest ACS but many other  chronic and acute conditions are known to elevate hsTnI results.  Refer to the "Links" section for chest pain algorithms and additional  guidance. Performed at [facility], [place] [address], [place], [place] [address]   CBG monitoring, ED (now and then every hour for 3 hours)     Status: Abnormal   Collection Time: 06/01/21  3:13 AM  Result Value Ref Range   Glucose-Capillary 225 (H) 70 - 99 mg/dL    Comment: Glucose reference range applies only to samples taken after fasting for at least 8 hours.  CBC with Differential/Platelet     Status: Abnormal   Collection Time: 06/01/21  3:24 AM  Result Value Ref Range   WBC 7.1 4.0 - 10.5 K/uL   RBC 2.52 (L) 3.87 - 5.11 MIL/uL   Hemoglobin 7.6 (L) 12.0 - 15.0 g/dL   HCT 25.0 (L) 36.0 - 46.0 %   MCV 99.2 80.0 - 100.0 fL   MCH 30.2 26.0 - 34.0 pg   MCHC 30.4 30.0 - 36.0 g/dL   RDW 15.0 11.5 - 15.5 %   Platelets 185 150 - 400 K/uL   nRBC 0.0 0.0 - 0.2 %   Neutrophils Relative % 67 %   Neutro Abs 4.8 1.7 - 7.7 K/uL   Lymphocytes Relative 20 %   Lymphs Abs 1.4 0.7 - 4.0 K/uL   Monocytes Relative 7 %   Monocytes Absolute 0.5 0.1 -  1.0 K/uL   Eosinophils Relative 5 %   Eosinophils Absolute 0.4 0.0 - 0.5 K/uL   Basophils Relative 1 %   Basophils Absolute 0.1 0.0 - 0.1 K/uL   Immature Granulocytes 0 %   Abs Immature Granulocytes 0.01 0.00 - 0.07 K/uL    Comment: Performed at [facility], [place] [address] [place], [place] [address]  CBG monitoring, ED (now and then every hour for 3 hours)     Status: Abnormal   Collection Time: 06/01/21  3:53 AM  Result Value Ref Range   Glucose-Capillary 211 (H) 70 - 99 mg/dL    Comment: Glucose reference range applies only to samples taken after fasting for at least 8 hours.  CBG monitoring, ED (now and then every hour for 3 hours)     Status: Abnormal    Collection Time: 06/01/21  6:57 AM  Result Value Ref Range   Glucose-Capillary 124 (H) 70 - 99 mg/dL    Comment: Glucose reference range applies only to samples taken after fasting for at least 8 hours.  CBG monitoring, ED (now and then every hour for 3 hours)     Status: Abnormal   Collection Time: 06/01/21  7:40 AM  Result Value Ref Range   Glucose-Capillary 105 (H) 70 - 99 mg/dL    Comment: Glucose reference range applies only to samples taken after fasting for at least 8 hours.   Comment 1 Notify RN    Comment 2 Document in Chart   CBG monitoring, ED     Status: None   Collection Time: 06/01/21  9:24 AM  Result Value Ref Range   Glucose-Capillary 77 70 - 99 mg/dL    Comment: Glucose reference range applies only to samples taken after fasting for at least 8 hours.   Comment 1 Notify RN    Comment 2 Document in Chart   CBG monitoring, ED     Status: None   Collection Time: 06/01/21 10:07 AM  Result Value Ref Range   Glucose-Capillary 78 70 - 99 mg/dL    Comment: Glucose reference range applies only to samples taken after fasting for at least 8 hours.   Comment 1 Notify RN    Comment 2 Document in Chart   CBG monitoring, ED     Status: Abnormal   Collection Time: 06/01/21 10:57 AM  Result Value Ref Range   Glucose-Capillary 144 (H) 70 - 99 mg/dL    Comment: Glucose reference range applies only to samples taken after fasting for at least 8 hours.   Comment 1 Notify RN    Comment 2 Document in Chart   CBG monitoring, ED     Status: Abnormal   Collection Time: 06/01/21 12:21 PM  Result Value Ref Range   Glucose-Capillary 114 (H) 70 - 99 mg/dL    Comment: Glucose reference range applies only to samples taken after fasting for at least 8 hours.   Comment 1 Notify RN    Comment 2 Document in Chart   CBG monitoring, ED     Status: Abnormal   Collection Time: 06/01/21 12:49 PM  Result Value Ref Range   Glucose-Capillary 114 (H) 70 - 99 mg/dL    Comment: Glucose reference range  applies only to samples taken after fasting for at least 8 hours.   Comment 1 Notify RN    Comment 2 Document in Chart   CBG monitoring, ED     Status: None   Collection Time: 06/01/21  2:08  PM  Result Value Ref Range   Glucose-Capillary 98 70 - 99 mg/dL    Comment: Glucose reference range applies only to samples taken after fasting for at least 8 hours.   Comment 1 Notify RN    Comment 2 Document in Chart   CBG monitoring, ED     Status: None   Collection Time: 06/01/21  3:28 PM  Result Value Ref Range   Glucose-Capillary 77 70 - 99 mg/dL    Comment: Glucose reference range applies only to samples taken after fasting for at least 8 hours.    US RENAL  Result Date: 06/01/2021 CLINICAL DATA:  Acute kidney injury, history of CKD stage IV EXAM: RENAL / URINARY TRACT ULTRASOUND COMPLETE COMPARISON:  Ultrasound 03/13/2021, CT abdomen pelvis 05/12/2021 FINDINGS: Right Kidney: Renal measurements: 8.8 x 3.5 x 4.5 cm = volume: 71.2 mL. Echogenicity is within normal limits. No concerning renal mass, shadowing calculus or hydronephrosis. Left Kidney: Renal measurements: 8.3 x 4.8 x 3.7 cm = volume: 63.8 mL. Echogenicity is within normal limits. Solid echogenic 8 x 6 x 8 mm echogenic mass present in the interpolar left kidney given the appearance of macroscopic fat on comparison CT imaging and stability from multiple prior exams, favor renal angiomyolipoma. No concerning renal mass, shadowing calculus or hydronephrosis. Bladder: Appears normal for degree of bladder distention. Other: Bilateral pleural effusions. Technically challenging exam due to altered mental status and inability to reposition in decubitus IMPRESSION: Echogenic 8 mm solid lesion in the interpolar left kidney corresponding well to a region of macroscopic fat on recent comparison CT images, and stable appearance from more remote CT comparison is as well, strongly favoring renal angiomyolipoma. Could consider 6-12 month follow-up surveillance  imaging. Bilateral pleural effusions. Otherwise unremarkable urinary tract ultrasound. Electronically Signed   By: Lovena Le M.D.   On: 06/01/2021 04:45   DG Chest Port 1 View  Result Date: 06/01/2021 CLINICAL DATA:  Shortness of breath. Altered mental status. Low blood sugar. EXAM: PORTABLE CHEST 1 VIEW COMPARISON:  05/12/2021 FINDINGS: Cardiac enlargement. Bilateral perihilar infiltration could represent pneumonia or edema. Small bilateral pleural effusions. Mediastinal contours appear intact. No pneumothorax. Calcification of the aorta. IMPRESSION: Cardiac enlargement with perihilar infiltrates and bilateral pleural effusions. Electronically Signed   By: Lucienne Capers M.D.   On: 06/01/2021 01:23    Assessment/Plan   AKI on CKD IV: in the setting of hypoglyemic episode and CHF exacerbation on the background of RAS.  She will be very sensitive to even the smallest hypotensive episode in the setting of bilateral RAS.  Renal US without obstruction 06/01/21.   Plan: - priority will be fluid removal - decrease hydralazine to 50 mg TID from 100 mg TID to aid in keeping BP up for diuresis - Lasix 80 mg IV daily - previously intervention not pursued for RAS- consider - ordering UA and UP/C  2.  dCHF exacerbation: metoprolol, imdur, decreased hydralazine as above as well as Lasix  3.  Hypoglycemia: off all oral hypoglycemics, CBGs have been adequate.  4.  Dispo: pending  Amorah Sebring 06/01/2021, 4:09 PM

## 2021-06-01 NOTE — Progress Notes (Signed)
Heart Failure Navigator Progress Note  Assessed for Heart & Vascular TOC clinic readiness.  Unfortunately at this time the patient does not meet criteria due to AKI likely contributing to decompensation of HF. CrCl <20.   Navigator available for reassessment of patient.   Kerby Nora, PharmD, BCPS Heart Failure Stewardship Pharmacist Phone [phone]

## 2021-06-01 NOTE — Progress Notes (Addendum)
Patient admitted to the hospital earlier this morning by Dr. Alcario Drought  Patient seen and examined. She still feels short of breath. She is noted to have bilateral crackles. Blood sugars have been stable after initial D50.  A/P:  AKI on CKD 4 -baseline creatinine appears to be around 2.5 -she presented with a creatinine of 4.0 -no evidence of hydronephrosis on imaging -will request nephrology input -monitor renal function in the setting of diuresis  Acute on chronic diastolic CHF -she is being treated with IV lasix -continues to have evidence of volume overload -monitor intake and output -BNP 1,106 -cardiac enzymes negative  Hypoglycemia in the setting of diabetes type 2 -unclear if this is related to prandin in the setting of worsening renal failure -she did receive a dose of D50 in ED and follow up blood sugars appear to be better -continue to follow  Possible history of adrenal insufficiency -she has been following with Dr. Loanne Drilling -she was previously on solucortef, but this was discontinued on her last visit -cortisol level on admission is 2.5 -discussed with Dr. Dwyane Dee, on call for endocrine, and will check cosyntropin stim test -will also check ACTH level  Hypothyroidism -repeat TSH -check free T4 -continue on home dose of levothyroxine  Anemia of chronic disease  -baseline hemoglobin ranging between 7-8 -currently hemoglobin 7.6 -continue to follow  HTN -currently on hydralazine, metoprolol, amlodipine -blood pressure stable  [name]

## 2021-06-01 NOTE — H&P (Signed)
History and Physical    [name] [id number] DOB: 1961-07-09 DOA: 06/01/2021  PCP: Ladell Pier, MD  Patient coming from: Home  I have personally briefly reviewed patient's old medical records in [place]  Chief Complaint: Hypoglycemia  HPI: [name] is a 60 y.o. female with medical history significant of DM2, CKD 4, HTN, hypothyroidism, dCHF.  Pt recently admitted 5/25-5/28 for severe constipation.  Pt presents to ED today with AMS.  EMS called for AMS, found pt to be hypoglycemic with BGL of 30.  Gave D50.  Mental status improved / baseline post D50.  Patient reports that she has been taking her home medications as prescribed.  States that she takes twice daily Prandin.  Reports that she ate a small bowl of lo mein as well as watermelon tonight.  Reports that she has had little appetite over the past several days.   Patient also reports gradual onset, worsening shortness of breath and dyspnea on exertion for the past several days.  Associated fatigue.  Patient reports that her shortness of breath was so severe she required oxygen from her sons oxygen supply.  Has dry cough.  No fevers, chills, CP, abd pain.   ED Course: In addition to hypoglycemia, pt also has decompensated CHF.  Culprit causing both appears to be acute kidney injury with creat 4.0 up from 2.6 baseline.  EDP gave '40mg'$  IV lasix.   Review of Systems: As per HPI, otherwise all review of systems negative.  Past Medical History:  Diagnosis Date   Adrenal insufficiency, primary ([place])    Anemia 2018   Hgb 6.8 early 11/2020.  PRBC x 1.  06/2017 PRBC x 1.     CHF (congestive heart failure) ([employer]) [id number]   Diastolic   CKD (chronic kidney disease) stage 4, GFR 15-29 ml/min ([place]) 04/2021   Stage 4 as of late May 2022   Diabetes mellitus without complication ([place])    Hypertension    Hypothyroidism     Past Surgical History:  Procedure Laterality Date   CESAREAN SECTION        reports that she has never smoked. She has never used smokeless tobacco. She reports that she does not drink alcohol and does not use drugs.  Allergies  Allergen Reactions   Beef-Derived Products     RELIGOUS  REASONS   Pork-Derived Products     RELIGIOUS REASONS   Percocet [Oxycodone-Acetaminophen] Rash    Family History  Problem Relation Age of Onset   Diabetes Mother    Hyperlipidemia Father    Hypertension Father    Adrenal disorder Neg Hx      Prior to Admission medications   Medication Sig Start Date End Date Taking? Authorizing Provider  acetaminophen (TYLENOL) 325 MG tablet Take 2 tablets (650 mg total) by mouth every 6 (six) hours as needed for mild pain. 03/31/18   Dixie Dials, MD  amLODipine (NORVASC) 10 MG tablet TAKE 1 TABLET (10 MG TOTAL) BY MOUTH DAILY. Patient taking differently: Take 10 mg by mouth daily. 04/08/21 04/08/22  Ladell Pier, MD  aspirin 81 MG chewable tablet Chew 81 mg by mouth daily.    [provider]  atorvastatin (LIPITOR) 20 MG tablet TAKE 1 TABLET (20 MG TOTAL) BY MOUTH DAILY. Patient taking differently: Take 20 mg by mouth daily. 12/02/20 12/02/21  Charlott Rakes, MD  Calcium Carbonate-Vit D-Min (CALCIUM 1200 PO) Take 1,200 mg by mouth daily.    [provider]  fluticasone (FLONASE) 50  MCG/ACT nasal spray PLACE 2 SPRAYS INTO BOTH NOSTRILS DAILY. 10/29/20 10/29/21  Charlott Rakes, MD  hydrALAZINE (APRESOLINE) 100 MG tablet Take 1 tablet (100 mg total) by mouth 3 (three) times daily. 03/30/21   Ladell Pier, MD  isosorbide mononitrate (IMDUR) 60 MG 24 hr tablet TAKE 1 TABLET BY MOUTH DAILY Patient taking differently: Take 60 mg by mouth daily. 03/04/21 03/04/22  Justin Mend, MD  levothyroxine (SYNTHROID) 150 MCG tablet TAKE 1 TABLET (150 MCG TOTAL) BY MOUTH DAILY BEFORE BREAKFAST. Patient taking differently: Take 150 mcg by mouth daily before breakfast. 10/29/20 10/29/21  Charlott Rakes, MD  metoprolol succinate  (TOPROL-XL) 25 MG 24 hr tablet Take 1 tablet (25 mg total) by mouth daily. 03/30/21   Ladell Pier, MD  Multiple Vitamin (MULTIVITAMIN WITH MINERALS) TABS tablet Take 1 tablet by mouth daily.    [provider]  omeprazole (PRILOSEC) 40 MG capsule TAKE 1 CAPSULE (40 MG TOTAL) BY MOUTH DAILY. Patient taking differently: Take 40 mg by mouth daily. 05/06/21 05/06/22  Charlott Rakes, MD  ondansetron (ZOFRAN) 4 MG tablet Take 1 tablet (4 mg total) by mouth daily as needed for nausea or vomiting. 05/18/21   Ladell Pier, MD  polyethylene glycol (MIRALAX / GLYCOLAX) 17 g packet Take 17 g by mouth daily. 05/17/21   Ghimire, Henreitta Leber, MD  repaglinide (PRANDIN) 0.5 MG tablet TAKE 1 TABLET (0.5 MG TOTAL) BY MOUTH 2 (TWO) TIMES DAILY BEFORE A MEAL. Patient taking differently: Take 0.5 mg by mouth 2 (two) times daily before a meal. 02/15/21 02/15/22  Renato Shin, MD  torsemide (DEMADEX) 20 MG tablet Take 2 tablets (40 mg total) by mouth as needed. Patient taking differently: Take 40 mg by mouth as needed (fluid). 05/27/21   Buford Dresser, MD  traMADol (ULTRAM) 50 MG tablet Take 1 tablet (50 mg total) by mouth every 12 (twelve) hours as needed. 05/21/21   Ladell Pier, MD  vitamin B-12 (CYANOCOBALAMIN) 1000 MCG tablet Take 1 tablet (1,000 mcg total) by mouth daily. 05/16/21   Jonetta Osgood, MD    Physical Exam: Vitals:   06/01/21 0042 06/01/21 0415  BP: (!) 141/48   Pulse: (!) 58 63  Resp: 20 (!) 21  Temp: (!) 97.4 F (36.3 C)   TempSrc: Oral   SpO2: 90% 91%    Constitutional: NAD, calm, comfortable Eyes: PERRL, lids and conjunctivae normal ENMT: Mucous membranes are moist. Posterior pharynx clear of any exudate or lesions.Normal dentition.  Neck: normal, supple, no masses, no thyromegaly Respiratory: clear to auscultation bilaterally, no wheezing, no crackles. Normal respiratory effort. No accessory muscle use.  Cardiovascular: Regular rate and rhythm, no murmurs /  rubs / gallops. No extremity edema. 2+ pedal pulses. No carotid bruits.  Abdomen: no tenderness, no masses palpated. No hepatosplenomegaly. Bowel sounds positive.  Musculoskeletal: no clubbing / cyanosis. No joint deformity upper and lower extremities. Good ROM, no contractures. Normal muscle tone.  Skin: no rashes, lesions, ulcers. No induration Neurologic: CN 2-12 grossly intact. Sensation intact, DTR normal. Strength 5/5 in all 4.  Psychiatric: Normal judgment and insight. Alert and oriented x 3. Normal mood.    Labs on Admission: I have personally reviewed following labs and imaging studies  CBC: Recent Labs  Lab 06/01/21 0324  WBC 7.1  NEUTROABS 4.8  HGB 7.6*  HCT 25.0*  MCV 99.2  PLT [id number]   Basic Metabolic Panel: Recent Labs  Lab 06/01/21 0145  NA 131*  K 4.3  CL  100  CO2 21*  GLUCOSE 54*  BUN 43*  CREATININE 4.06*  CALCIUM 8.8*   GFR: Estimated Creatinine Clearance: 11.5 mL/min (A) (by C-G formula based on SCr of 4.06 mg/dL (H)). Liver Function Tests: Recent Labs  Lab 06/01/21 0145  AST 27  ALT 12  ALKPHOS 127*  BILITOT 0.9  PROT 7.0  ALBUMIN 3.4*   No results for input(s): LIPASE, AMYLASE in the last 168 hours. No results for input(s): AMMONIA in the last 168 hours. Coagulation Profile: No results for input(s): INR, PROTIME in the last 168 hours. Cardiac Enzymes: No results for input(s): CKTOTAL, CKMB, CKMBINDEX, TROPONINI in the last 168 hours. BNP (last 3 results) No results for input(s): PROBNP in the last 8760 hours. HbA1C: No results for input(s): HGBA1C in the last 72 hours. CBG: Recent Labs  Lab 06/01/21 0137 06/01/21 0313 06/01/21 0353  GLUCAP 39* 225* 211*   Lipid Profile: No results for input(s): CHOL, HDL, LDLCALC, TRIG, CHOLHDL, LDLDIRECT in the last 72 hours. Thyroid Function Tests: No results for input(s): TSH, T4TOTAL, FREET4, T3FREE, THYROIDAB in the last 72 hours. Anemia Panel: No results for input(s): VITAMINB12, FOLATE,  FERRITIN, TIBC, IRON, RETICCTPCT in the last 72 hours. Urine analysis:    Component Value Date/Time   COLORURINE YELLOW 05/12/2021 1720   APPEARANCEUR CLEAR 05/12/2021 1720   LABSPEC 1.015 05/12/2021 1720   PHURINE 5.0 05/12/2021 1720   GLUCOSEU NEGATIVE 05/12/2021 1720   HGBUR NEGATIVE 05/12/2021 1720   BILIRUBINUR NEGATIVE 05/12/2021 1720   [place] 05/12/2021 1720   PROTEINUR 30 (A) 05/12/2021 1720   UROBILINOGEN 0.2 06/12/2011 1904   NITRITE NEGATIVE 05/12/2021 1720   LEUKOCYTESUR NEGATIVE 05/12/2021 1720    Radiological Exams on Admission: US RENAL  Result Date: 06/01/2021 CLINICAL DATA:  Acute kidney injury, history of CKD stage IV EXAM: RENAL / URINARY TRACT ULTRASOUND COMPLETE COMPARISON:  Ultrasound 03/13/2021, CT abdomen pelvis 05/12/2021 FINDINGS: Right Kidney: Renal measurements: 8.8 x 3.5 x 4.5 cm = volume: 71.2 mL. Echogenicity is within normal limits. No concerning renal mass, shadowing calculus or hydronephrosis. Left Kidney: Renal measurements: 8.3 x 4.8 x 3.7 cm = volume: 63.8 mL. Echogenicity is within normal limits. Solid echogenic 8 x 6 x 8 mm echogenic mass present in the interpolar left kidney given the appearance of macroscopic fat on comparison CT imaging and stability from multiple prior exams, favor renal angiomyolipoma. No concerning renal mass, shadowing calculus or hydronephrosis. Bladder: Appears normal for degree of bladder distention. Other: Bilateral pleural effusions. Technically challenging exam due to altered mental status and inability to reposition in decubitus IMPRESSION: Echogenic 8 mm solid lesion in the interpolar left kidney corresponding well to a region of macroscopic fat on recent comparison CT images, and stable appearance from more remote CT comparison is as well, strongly favoring renal angiomyolipoma. Could consider 6-12 month follow-up surveillance imaging. Bilateral pleural effusions. Otherwise unremarkable urinary tract ultrasound.  Electronically Signed   By: Lovena Le M.D.   On: 06/01/2021 04:45   DG Chest Port 1 View  Result Date: 06/01/2021 CLINICAL DATA:  Shortness of breath. Altered mental status. Low blood sugar. EXAM: PORTABLE CHEST 1 VIEW COMPARISON:  05/12/2021 FINDINGS: Cardiac enlargement. Bilateral perihilar infiltration could represent pneumonia or edema. Small bilateral pleural effusions. Mediastinal contours appear intact. No pneumothorax. Calcification of the aorta. IMPRESSION: Cardiac enlargement with perihilar infiltrates and bilateral pleural effusions. Electronically Signed   By: Lucienne Capers M.D.   On: 06/01/2021 01:23    EKG: Independently reviewed.  Assessment/Plan Principal Problem:   AKI (acute kidney injury) ([place]) Active Problems:   CKD (chronic kidney disease) stage 4, GFR 15-29 ml/min ([facility])   HTN (hypertension)   Anemia due to chronic kidney disease   Acute on chronic diastolic CHF (congestive heart failure) ([facility])   DM2 (diabetes mellitus, type 2) ([facility])    AKI on CKD 4 - Unclear cause, but pt fluid overloaded Giving another '80mg'$  IV lasix (on top of the 40 EDP gave) Strict intake and output Renal US: formal read pending but per tech: no obstruction to explain failure. Repeat BMP in AM Nephrology consult in AM Acute on chronic diastolic CHF - Decompensation likely due to renal failure Lasix as above Tele monitor Hypoglycemia, DM2- Due to increased effect duration of Prandin in setting of AKI CBG checks q1h Hold Prandin HTN - Cont home BP meds Anemia due to CKD - Chronic, stable  DVT prophylaxis: Heparin [employer] Code Status: Full Family Communication: No family in room Disposition Plan: Home after treatment for AKI, CHF Consults called: Sent message to Dr. Jonnie Finner for nephrology consult in AM Admission status: Admit to inpatient  Severity of Illness: The appropriate patient status for this patient is INPATIENT. Inpatient status is judged to be reasonable and necessary in  order to provide the required intensity of service to ensure the patient's safety. The patient's presenting symptoms, physical exam findings, and initial radiographic and laboratory data in the context of their chronic comorbidities is felt to place them at high risk for further clinical deterioration. Furthermore, it is not anticipated that the patient will be medically stable for discharge from the hospital within 2 midnights of admission. The following factors support the patient status of inpatient.   Patient has acute kidney injury.  Patient has one of the following: Increase in Serum Creatinine >0.3 mg/dL within 48h Increase in Serum Creatinine > 1.5 times baseline known or presumed to have been within the last 7 days Urine volume < 0.5 ml/kg/hr for 6 hours    * I certify that at the point of admission it is my clinical judgment that the patient will require inpatient hospital care spanning beyond 2 midnights from the point of admission due to high intensity of service, high risk for further deterioration and high frequency of surveillance required.*   Panda Crossin M. DO [facility]  How to contact the [facility] Attending or Consulting provider [place] or covering provider during after hours [place], for this patient?  Check the care team in [facility] and look for a) attending/consulting [facility] provider listed and b) the [facility] team listed Log into [email or web address]  Amion Physician Scheduling and messaging for groups and whole hospitals  On call and physician scheduling software for group practices, residents, hospitalists and other medical providers for call, clinic, rotation and shift schedules. OnCall [employer] is a hospital-wide system for scheduling doctors and paging doctors on call. EasyPlot is for scientific plotting and data analysis.  [email or web address]  and use [place]'s universal password to access. If you do not have the password, please contact the hospital operator.  Locate the [facility]  provider you are looking for under [facility] and page to a number that you can be directly reached. If you still have difficulty reaching the provider, please page the [facility] (Director on Call) for the Hospitalists listed on amion for assistance.  06/01/2021, 4:53 AM

## 2021-06-02 ENCOUNTER — Inpatient Hospital Stay (HOSPITAL_COMMUNITY): Payer: Medicaid Other

## 2021-06-02 ENCOUNTER — Encounter (HOSPITAL_COMMUNITY): Payer: Self-pay | Admitting: Internal Medicine

## 2021-06-02 DIAGNOSIS — N184 Chronic kidney disease, stage 4 (severe): Secondary | ICD-10-CM

## 2021-06-02 DIAGNOSIS — N179 Acute kidney failure, unspecified: Secondary | ICD-10-CM

## 2021-06-02 DIAGNOSIS — D631 Anemia in chronic kidney disease: Secondary | ICD-10-CM

## 2021-06-02 DIAGNOSIS — E11649 Type 2 diabetes mellitus with hypoglycemia without coma: Secondary | ICD-10-CM

## 2021-06-02 LAB — BLOOD GAS, ARTERIAL
Acid-base deficit: 2.8 mmol/L — ABNORMAL HIGH (ref 0.0–2.0)
Bicarbonate: 21.6 mmol/L (ref 20.0–28.0)
Drawn by: 331761
FIO2: 52
O2 Saturation: 87.1 %
Patient temperature: 36.9
pCO2 arterial: 37.9 mmHg (ref 32.0–48.0)
pH, Arterial: 7.375 (ref 7.350–7.450)
pO2, Arterial: 54.7 mmHg — ABNORMAL LOW (ref 83.0–108.0)

## 2021-06-02 LAB — GLUCOSE, CAPILLARY
Glucose-Capillary: 91 mg/dL (ref 70–99)
Glucose-Capillary: 90 mg/dL (ref 70–99)
Glucose-Capillary: 87 mg/dL (ref 70–99)
Glucose-Capillary: 85 mg/dL (ref 70–99)
Glucose-Capillary: 85 mg/dL (ref 70–99)
Glucose-Capillary: 313 mg/dL — ABNORMAL HIGH (ref 70–99)
Glucose-Capillary: 78 mg/dL (ref 70–99)
Glucose-Capillary: 185 mg/dL — ABNORMAL HIGH (ref 70–99)
Glucose-Capillary: 162 mg/dL — ABNORMAL HIGH (ref 70–99)
Glucose-Capillary: 136 mg/dL — ABNORMAL HIGH (ref 70–99)
Glucose-Capillary: 108 mg/dL — ABNORMAL HIGH (ref 70–99)
Glucose-Capillary: 103 mg/dL — ABNORMAL HIGH (ref 70–99)
Glucose-Capillary: 123 mg/dL — ABNORMAL HIGH (ref 70–99)
Glucose-Capillary: 118 mg/dL — ABNORMAL HIGH (ref 70–99)
Glucose-Capillary: 111 mg/dL — ABNORMAL HIGH (ref 70–99)
Glucose-Capillary: 104 mg/dL — ABNORMAL HIGH (ref 70–99)
Glucose-Capillary: 116 mg/dL — ABNORMAL HIGH (ref 70–99)
Glucose-Capillary: 111 mg/dL — ABNORMAL HIGH (ref 70–99)

## 2021-06-02 LAB — BASIC METABOLIC PANEL
Anion gap: 9 (ref 5–15)
BUN: 44 mg/dL — ABNORMAL HIGH (ref 8–23)
CO2: 22 mmol/L (ref 22–32)
Calcium: 8.7 mg/dL — ABNORMAL LOW (ref 8.9–10.3)
Chloride: 98 mmol/L (ref 98–111)
Creatinine, Ser: 4.33 mg/dL — ABNORMAL HIGH (ref 0.44–1.00)
GFR, Estimated: 11 mL/min — ABNORMAL LOW (ref >60)
Glucose, Bld: 100 mg/dL — ABNORMAL HIGH (ref 70–99)
Potassium: 4.2 mmol/L (ref 3.5–5.1)
Sodium: 129 mmol/L — ABNORMAL LOW (ref 135–145)

## 2021-06-02 LAB — TSH: TSH: 1.738 u[IU]/mL (ref 0.350–4.500)

## 2021-06-02 LAB — T4, FREE: Free T4: 0.89 ng/dL (ref 0.61–1.12)

## 2021-06-02 LAB — AMMONIA: Ammonia: 82 umol/L — ABNORMAL HIGH (ref 9–35)

## 2021-06-02 MED ORDER — DEXTROSE 50 % IV SOLN
INTRAVENOUS | Status: AC
  Administered 2021-06-02: 50 mL
  Filled 2021-06-02: qty 50

## 2021-06-02 MED ORDER — COSYNTROPIN 0.25 MG IJ SOLR
0.2500 mg | Freq: Once | INTRAMUSCULAR | Status: AC
  Administered 2021-06-03: 0.25 mg via INTRAVENOUS
  Filled 2021-06-02: qty 0.25

## 2021-06-02 MED ORDER — OCTREOTIDE ACETATE 50 MCG/ML IJ SOLN
50.0000 ug | Freq: Once | INTRAMUSCULAR | Status: AC
  Administered 2021-06-02: 50 ug via SUBCUTANEOUS
  Filled 2021-06-02: qty 1

## 2021-06-02 MED ORDER — DEXTROSE 50 % IV SOLN: 1.0000 | Freq: Once | INTRAVENOUS | Status: DC

## 2021-06-02 MED ORDER — CHLORHEXIDINE GLUCONATE CLOTH 2 % EX PADS
6.0000 | MEDICATED_PAD | Freq: Every day | CUTANEOUS | Status: DC
  Administered 2021-06-02 – 2021-06-10 (×8): 6 via TOPICAL

## 2021-06-02 MED ORDER — LACTULOSE 10 GM/15ML PO SOLN
20.0000 g | Freq: Three times a day (TID) | ORAL | Status: DC
  Administered 2021-06-02 – 2021-06-10 (×9): 20 g via ORAL
  Filled 2021-06-02 (×12): qty 30

## 2021-06-02 MED ORDER — FUROSEMIDE 10 MG/ML IJ SOLN
80.0000 mg | Freq: Two times a day (BID) | INTRAMUSCULAR | Status: DC
  Administered 2021-06-02 – 2021-06-06 (×9): 80 mg via INTRAVENOUS
  Filled 2021-06-02 (×9): qty 8

## 2021-06-02 NOTE — Progress Notes (Signed)
Cbg changed to every 2 hours as per MD.

## 2021-06-02 NOTE — Significant Event (Signed)
Patient w/ 1hr order for CBG, result trending down, pt appears lethargic disoriented to self and place and moaning, which is different from baseline. O2 sat at 85 on 6L [place]. Pt denied usage of oxygen at home. Pt also c/o nausea , denied hunger and breakfast is untouched 1amp Dextrose admin, pt is now sating at 90- 91% on 8Lw/ [place]. MD notifed.    Recheck CBG 313.

## 2021-06-02 NOTE — Significant Event (Signed)
Rapid Response Event Note   Reason for Call :  Lethargy, disorientation, acute oxygen desaturation 85% on [facility]  Initial Focused Assessment:  Pt lying in bed, lethargic. Oriented on my evaluation. Tachypnea with mild accessory muscle use. Coarse crackles heard in bilateral bases, R>L. Skin is very warm, dry to touch. Pt denies pain.   VS: T 98.35F, BP 148/56, HR 65, RR 28, SpO2 100% on 8L HFNC  Interventions:  Provider at bedside, the following orders received:  -Ammonia -ABG  7.375 / 37.9/ 54.7/ 21.6 -'80mg'$  IV Lasix -CXR -Pt suffering from hypoglycemia PTA, Dextrose 50% given with CBG follow-up ordered  Plan of Care:  -Oxygen saturation 100% on 8LNC, wean oxygen as pt tolerates -Transfer to PCU  Call rapid response for additional needs  Event Summary:  MD Notified: Dr. Roderic Palau Call Time: [id number] Arrival Time: 0930 End Time: [place], RN

## 2021-06-02 NOTE — Progress Notes (Signed)
  [facility] Progress Note   Assessment/ Plan:    AKI on CKD IV: in the setting of hypoglyemic episode and CHF exacerbation on the background of RAS.  She will be very sensitive to even the smallest hypotensive episode in the setting of bilateral RAS.  Renal US without obstruction 06/01/21.   Plan: - priority will be fluid removal - stop hydralazine - increase to Lasix 80 mg IV BID - previously intervention not pursued for RAS- consider - ordering UA and UP/C - no indication for dialysis at present but will closely monitor   2.  dCHF exacerbation: metoprolol, imdur, decreased hydralazine as above as well as Lasix   3.  Hypoglycemia: off all oral hypoglycemics, CBGs have been adequate.  Doing q 1 hr CBGs in progressive unit now  4.  Acute hypoxic RF: presumably to pulm edema- increase diuresis as above  5.  History of adrenal insufficiency: getting cosyntropin test per primary   6.  Dispo: pending  Subjective:    Had RR today for lethargy and increased O2 requirement.  CXR showing improved pulm edema but not resolved.  Curled up in bed on 11 L HFNC O2.  Mother at bedside.   Objective:   BP 96/82 (BP Location: Right Arm)   Pulse 62   Temp 98.5 F (36.9 C) (Oral)   Resp 19   SpO2 90%   Physical Exam: GEN: curled up in bed, NAD, sleeping, moaning HEENT eyes closed NECK + JVD, unchanged PULM bilateral crackles CV RRR ABD soft, mildly diffusely tender EXT no LE edema  Labs: BMET Recent Labs  Lab 06/01/21 0145 06/02/21 0103  NA 131* 129*  K 4.3 4.2  CL 100 98  CO2 21* 22  GLUCOSE 54* 100*  BUN 43* 44*  CREATININE 4.06* 4.33*  CALCIUM 8.8* 8.7*   CBC Recent Labs  Lab 06/01/21 0324  WBC 7.1  NEUTROABS 4.8  HGB 7.6*  HCT 25.0*  MCV 99.2  PLT 185      Medications:     amLODipine  10 mg Oral Daily   aspirin  81 mg Oral Daily   atorvastatin  20 mg Oral Daily   calcium-vitamin D  2 tablet Oral Q breakfast   Chlorhexidine Gluconate Cloth   6 each Topical Daily   [START ON 06/03/2021] cosyntropin  0.25 mg Intravenous Once   dextrose  1 ampule Intravenous Once   fluticasone  2 spray Each Nare Daily   furosemide  80 mg Intravenous BID   heparin  5,000 Units Subcutaneous Q8H   isosorbide mononitrate  60 mg Oral Daily   levothyroxine  150 mcg Oral Q0600   metoprolol succinate  25 mg Oral Daily   multivitamin with minerals  1 tablet Oral Daily   octreotide  50 mcg Subcutaneous Once   pantoprazole  80 mg Oral Daily   polyethylene glycol  17 g Oral Daily   sodium chloride flush  3 mL Intravenous Q12H   vitamin B-12  1,000 mcg Oral Daily     Madelon Lips MD 06/02/2021, 1:25 PM

## 2021-06-02 NOTE — Progress Notes (Signed)
Transferred -in from [facility] by bed , lethargic but  easy to arouse. She is oriented to self, place and time. Placed on HFNC 11L sat- 93%. Claimed she feels much better  .

## 2021-06-02 NOTE — Consult Note (Addendum)
NAME:  [name], MRN:  [id number], DOB:  [birth year], LOS: 1 ADMISSION DATE:  06/01/2021, CONSULTATION DATE:  06/02/2021 REFERRING MD:  Dr. Roderic Palau, CHIEF COMPLAINT:  Hypoxia   History of Present Illness:  60 year old female with prior history of CKD stage IV, HTN, HFpEF, bilateral RAS, DMT2, chronic anemia, hypothyroidism, and previous AI (off solucortef as of 01/2021 f/b endocrinology outpt) presenting from home on 6/13 with altered mental status, found to be hypoglycemic and in in acute on chronic diastolic HF with worsening renal function.  Had reported progressive exertional dyspnea and fatigue over the last several days and dry cough; apparently using her family member's oxygen.  Denied fever, chills, abd or chest pain.  Of note, recent hospitalization for severe constipation 5/28- 5/29; at that time she had CTH which showed ventriculomegaly.  Underwnet MRI showed out of proportion to volume loss, with recommendations for further outpatient neurology evaluation.  She was admitted to [facility].  Blood sugars have been 70-90s, also noted for hyponatremia, sCr 2.5->  now 4.33, BUN 44, pBNP 1100, neg trop hs trend, normal lactic acid.  CXR consistent with pulmonary edema and bilateral pleural effusions.  Diuresis started; unclear of urine output, none charted thus far.  Thus far she has been afebrile, normotensive.  Nephrology consulted for worsening renal function and lasix dose increased.  She was noted to have increasing O2 needs/ worsening hypoxia and lethargy, currently on 11L [place] HFNC on the morning of 6/14.  She was transferred to PCU for closer monitoring.  PCCM consulted for further recs.  ABG obtained showed 7.3/37/54/21 on 8L HFNC.   Currently remains lethargic but easily arouses.  Denies current pain, complains of slight SOB, currently at rest with no increased work of breathing on [place] HFNC at [place].  Pertinent  Medical History  CKD stage IV, HTN, HFpEF, bilateral RAS, DMT2, chronic anemia,  hypothyroidism, previous AI, sterocoral colitis, partially empty sella syndrome, vitamin B12 deficiency   Significant Hospital Events: Including procedures, antibiotic start and stop dates in addition to other pertinent events   6/14 PCCM consulted  Interim History / Subjective:  See above  Denies chest pain. Denies difficulty breathing. Endorses some SOB.   Objective   Blood pressure (!) 148/56, pulse 65, temperature 98.5 F (36.9 C), temperature source Oral, resp. rate (!) 28, SpO2 100 %.       No intake or output data in the 24 hours ending 06/02/21 1112 There were no vitals filed for this visit.  Examination: General:  in bed, appears comfortable, no acute distress HEENT: MM pink/moist, anicteric, atraumatic Neuro: GCS 14, eyes open to voice, RASS -2, PERRL 67m CV: S1S2, NSR on monitor, no m/r/g appreciated PULM:  crackles in the upper lobes and in the lower lobes, chest expansion,  symmetric, trachea midline  GI: soft, bsx4 active/hypoactive, nondistended   Extremities: warm/dry,  no pretibial edema, capillary less than 3 seconds  Skin: no rashes or lesions   Labs/imaging that I have personally reviewed  (right click and "Reselect all SmartList Selections" daily)  ABG- Hypoxia BMP- Hypo NA, BUN 44, creat 4.33, AG 9 CBC- anemia TSH-WNL T4- WNL CXR Resolved Hospital Problem list     Assessment & Plan:  Acute Respiratory Failure with Hypoxia  ABG 7.37/37.9/54.7/21. 6L>11L high flow [place]. Crackles present on ascultation.  Suspect secondary to fluid overload/Decompensated CHF exacerbation. CXR demonstrates small bilateral effusions and pulmonary edema. -Continue diuresis per Nephrology -If patient does not become more arousable in the next 6  hours obtain another ABG. -Appears comfortable on 11L [place]. If WOB increases could attempt bipap -Wells score for PE is 0 points. Low risk for PE.  Acute Metabolic Encephalopathy Oriented on exam. ?Metabolic vs hypoxia, BUN 44,  hypogylcemic this AM. TSH WNL. LFTs WNL. Suspect multifactorial. No hypercarbia. No fever. WBC WNL -Appreciate Nephrology input on uremia? -Follow  up ammonia level -Consider obtaining a head CT given previous ventriculomegaly   Acute on Chronic Diastolic CHF Suspect fluid overload component. No intake and output data available.  -Diuresis per nephrology -Obtain ECHO -recommend daily weights and strict I & O  DM2 Bg 78-313(S/P dextrose admin). Hypoglycemia occurred this AM at 0800. Unsure if this contributed to hypoxia/AME. Hypoglycemic on admission. -continue q1h CBG, monitor for hypoglycemia -One time dose of octreotide 61mg/sq to block prandin in the setting of worsening renal failure. Discussed with Dr. CTacy Learn  AKI on CKD 4 -Management per Nephrology -recommend daily weights and strict I & O  All other issues per primary.  Will follow for now.  Labs   CBC: Recent Labs  Lab 06/01/21 0324  WBC 7.1  NEUTROABS 4.8  HGB 7.6*  HCT 25.0*  MCV 99.2  PLT [id number]   Basic Metabolic Panel: Recent Labs  Lab 06/01/21 0145 06/02/21 0103  NA 131* 129*  K 4.3 4.2  CL 100 98  CO2 21* 22  GLUCOSE 54* 100*  BUN 43* 44*  CREATININE 4.06* 4.33*  CALCIUM 8.8* 8.7*   GFR: Estimated Creatinine Clearance: 10.8 mL/min (A) (by C-G formula based on SCr of 4.33 mg/dL (H)). Recent Labs  Lab 06/01/21 0145 06/01/21 0324  WBC  --  7.1  LATICACIDVEN 1.1  --     Liver Function Tests: Recent Labs  Lab 06/01/21 0145  AST 27  ALT 12  ALKPHOS 127*  BILITOT 0.9  PROT 7.0  ALBUMIN 3.4*   No results for input(s): LIPASE, AMYLASE in the last 168 hours. No results for input(s): AMMONIA in the last 168 hours.  ABG    Component Value Date/Time   PHART 7.375 06/02/2021 0958   PCO2ART 37.9 06/02/2021 0958   PO2ART 54.7 (L) 06/02/2021 0958   HCO3 21.6 06/02/2021 0958   TCO2 29 06/12/2011 1935   ACIDBASEDEF 2.8 (H) 06/02/2021 0958   O2SAT 87.1 06/02/2021 0958     Coagulation  Profile: No results for input(s): INR, PROTIME in the last 168 hours.  Cardiac Enzymes: No results for input(s): CKTOTAL, CKMB, CKMBINDEX, TROPONINI in the last 168 hours.  HbA1C: HbA1c, POC (controlled diabetic range)  Date/Time Value Ref Range Status  10/29/2020 02:24 PM 6.6 0.0 - 7.0 % Final   Hgb A1c MFr Bld  Date/Time Value Ref Range Status  02/13/2021 05:17 PM 8.6 (H) <5.7 % of total Hgb Final    Comment:    For someone without known diabetes, a hemoglobin A1c value of 6.5% or greater indicates that they may have  diabetes and this should be confirmed with a follow-up  test. . For someone with known diabetes, a value <7% indicates  that their diabetes is well controlled and a value  greater than or equal to 7% indicates suboptimal  control. A1c targets should be individualized based on  duration of diabetes, age, comorbid conditions, and  other considerations. . Currently, no consensus exists regarding use of hemoglobin A1c for diagnosis of diabetes for children. .Marland Kitchen  03/28/2018 04:52 AM 9.2 (H) 4.8 - 5.6 % Final    Comment:    (NOTE)  Pre diabetes:          5.7%-6.4% Diabetes:              >6.4% Glycemic control for   <7.0% adults with diabetes     CBG: Recent Labs  Lab 06/02/21 0608 06/02/21 0717 06/02/21 0848 06/02/21 0921 06/02/21 1020  GLUCAP 85 85 78 313* 185*    Review of Systems:   Unable to obtain subjective evaluation due to patient lethargy.   Past Medical History:  She,  has a past medical history of Adrenal insufficiency, primary ([place]), Anemia (2018), CHF (congestive heart failure) ([place]) (11/2021), CKD (chronic kidney disease) stage 4, GFR 15-29 ml/min ([place]) (04/2021), Diabetes mellitus without complication ([place]), Hypertension, and Hypothyroidism.   Surgical History:   Past Surgical History:  Procedure Laterality Date   CESAREAN SECTION       Social History:   reports that she has never smoked. She has never used smokeless tobacco.  She reports that she does not drink alcohol and does not use drugs.   Family History:  Her family history includes Diabetes in her mother; Hyperlipidemia in her father; Hypertension in her father. There is no history of Adrenal disorder.   Allergies Allergies  Allergen Reactions   Beef-Derived Products     RELIGOUS  REASONS   Pork-Derived Products     RELIGIOUS REASONS   Percocet [Oxycodone-Acetaminophen] Rash     Home Medications  Prior to Admission medications   Medication Sig Start Date End Date Taking? Authorizing Provider  acetaminophen (TYLENOL) 325 MG tablet Take 2 tablets (650 mg total) by mouth every 6 (six) hours as needed for mild pain. 03/31/18  Yes Dixie Dials, MD  amLODipine (NORVASC) 10 MG tablet TAKE 1 TABLET (10 MG TOTAL) BY MOUTH DAILY. Patient taking differently: Take 10 mg by mouth daily. 04/08/21 04/08/22 Yes Ladell Pier, MD  aspirin 81 MG chewable tablet Chew 81 mg by mouth daily.   Yes [provider]  atorvastatin (LIPITOR) 20 MG tablet TAKE 1 TABLET (20 MG TOTAL) BY MOUTH DAILY. Patient taking differently: Take 20 mg by mouth daily. 12/02/20 12/02/21 Yes Charlott Rakes, MD  Calcium Carbonate-Vit D-Min (CALCIUM 1200 PO) Take 1,200 mg by mouth daily.   Yes [provider]  fluticasone (FLONASE) 50 MCG/ACT nasal spray PLACE 2 SPRAYS INTO BOTH NOSTRILS DAILY. Patient taking differently: Place 2 sprays into both nostrils daily as needed for allergies. 10/29/20 10/29/21 Yes Charlott Rakes, MD  hydrALAZINE (APRESOLINE) 100 MG tablet Take 1 tablet (100 mg total) by mouth 3 (three) times daily. Patient taking differently: Take 100 mg by mouth 2 (two) times daily as needed (high blood pressure). 03/30/21  Yes Ladell Pier, MD  isosorbide mononitrate (IMDUR) 60 MG 24 hr tablet TAKE 1 TABLET BY MOUTH DAILY Patient taking differently: Take 60 mg by mouth daily. 03/04/21 03/04/22 Yes Justin Mend, MD  levothyroxine (SYNTHROID) 150 MCG tablet  TAKE 1 TABLET (150 MCG TOTAL) BY MOUTH DAILY BEFORE BREAKFAST. Patient taking differently: Take 150 mcg by mouth daily before breakfast. 10/29/20 10/29/21 Yes Charlott Rakes, MD  metoprolol succinate (TOPROL-XL) 25 MG 24 hr tablet Take 1 tablet (25 mg total) by mouth daily. 03/30/21  Yes Ladell Pier, MD  Multiple Vitamin (MULTIVITAMIN WITH MINERALS) TABS tablet Take 1 tablet by mouth daily.   Yes [provider]  omeprazole (PRILOSEC) 40 MG capsule TAKE 1 CAPSULE (40 MG TOTAL) BY MOUTH DAILY. Patient taking differently: Take 40 mg by mouth daily. 05/06/21 05/06/22  Yes Charlott Rakes, MD  ondansetron (ZOFRAN) 4 MG tablet Take 1 tablet (4 mg total) by mouth daily as needed for nausea or vomiting. 05/18/21  Yes Ladell Pier, MD  polyethylene glycol (MIRALAX / GLYCOLAX) 17 g packet Take 17 g by mouth daily. 05/17/21  Yes Ghimire, Henreitta Leber, MD  repaglinide (PRANDIN) 0.5 MG tablet TAKE 1 TABLET (0.5 MG TOTAL) BY MOUTH 2 (TWO) TIMES DAILY BEFORE A MEAL. Patient taking differently: Take 0.5 mg by mouth daily after breakfast. 02/15/21 02/15/22 Yes Renato Shin, MD  torsemide (DEMADEX) 20 MG tablet Take 2 tablets (40 mg total) by mouth as needed. Patient taking differently: Take 40 mg by mouth as needed (fluid). 05/27/21  Yes Buford Dresser, MD  traMADol (ULTRAM) 50 MG tablet Take 1 tablet (50 mg total) by mouth every 12 (twelve) hours as needed. Patient taking differently: Take 50 mg by mouth every 12 (twelve) hours as needed for moderate pain. 05/21/21  Yes Ladell Pier, MD  vitamin B-12 (CYANOCOBALAMIN) 1000 MCG tablet Take 1 tablet (1,000 mcg total) by mouth daily. 05/16/21  Yes Ghimire, Henreitta Leber, MD     Critical care time: N/A    Redmond School., MSN, APRN, AGACNP-BC [facility]  06/02/2021 , 12:02 PM  Please see [email or web address] for pager details  If no response, please call [phone] After hours, please call Elink at  [phone]

## 2021-06-02 NOTE — Progress Notes (Signed)
Mobility Specialist: Progress Note   06/02/21 1433  Mobility  Activity  (Cancel)   Instructed by RN to hold off on seeing pt today d/t difficulty breathing, will f/u tomorrow.   [facility] Gillis Boardley Mobility Specialist Mobility Specialist Phone: [phone]

## 2021-06-02 NOTE — Progress Notes (Addendum)
PROGRESS NOTE    [name]  [id number] DOB: [birth year] DOA: 06/01/2021 PCP: Ladell Pier, MD    Brief Narrative:  60 y/o female with history of CKD4, DM, admitted with AMS related to hypoglycemia. She was noted to be short of breath and imaging showed bilateral pleural effusions. She also had elevated creatinine above baseline. Nephrology following and she is on IV lasix. She continues to have episodes of hypoglycemia. Overall respiratory status has not made significant improvements. On 6/14 she was transferred to progressive care due to increasing oxygen requirements and work of breathing. Pulmonology also consulted.    Assessment & Plan:   Principal Problem:   AKI (acute kidney injury) ([place]) Active Problems:   CKD (chronic kidney disease) stage 4, GFR 15-29 ml/min (HCC)   HTN (hypertension)   Anemia due to chronic kidney disease   Acute on chronic diastolic CHF (congestive heart failure) (HCC)   DM2 (diabetes mellitus, type 2) (HCC)   Acute respiratory failure with hypoxia -patient reports that she is not on any chronic oxygen at home -she was noted to be hypoxic in ED and started on 2L -this was subsequently titrated up to 6L -this morning, she is noted to be hypoxic at 85% on 6L and was further titrated up to 8L -ABG shows normal pCO2 and pH, but does show pO2 of 54 -chest xray shows persistent bilateral effusions -will consult pulmonology for further input -will move to progressive care for closer monitoring  AKI on CKD 4 -baseline creatinine appears to be around 2.5 -she presented with a creatinine of 4.0 -> 4.3 -no evidence of hydronephrosis on imaging -nephrology following, appreciate input -although patient said she has been urinating, I do not see any urine output documented -due to her worsening condition/resp status/renal function, will request that foley catheter be placed for 24-48 hrs to ensure bladder is emptying and for accurate Is and Os.    Acute on chronic diastolic CHF -she is being treated with IV lasix -continues to have evidence of volume overload -monitor intake and output -BNP 1,106 -cardiac enzymes negative -will update echocardiogram to evaluate for pericardial effusion   Hypoglycemia in the setting of diabetes type 2 -unclear if this is related to prandin in the setting of worsening renal failure vs. Adrenal insufficiency -she did receive a dose of D50 in ED  -blood sugars in the 70-80s overnight -since she is somnolent, will give another dose of D50 now and closely monitor -continue to follow   Possible history of adrenal insufficiency -she has been following with Dr. Loanne Drilling -she was previously on solucortef, but this was discontinued on her last visit -cortisol level on admission is 2.5 -discussed with Dr. Dwyane Dee, on call for endocrinology, with recommendations to perform cosyntropin stimulation test -this was ordered for this morning, but unfortunately, cosyntropin was given prior to baseline cortisol being drawn -will reschedule for tomorrow AM, have counseled staff on lab draw -will also check ACTH level in AM   Hypothyroidism -TSH normal, FT4 normal -continue on home dose of levothyroxine   Anemia of chronic disease -baseline hemoglobin ranging between 7-8 -currently hemoglobin 7.6 -continue to follow   HTN -currently on hydralazine, metoprolol, amlodipine -blood pressure stable   DVT prophylaxis: heparin injection 5,000 Units Start: 06/01/21 0600  Code Status: full code Family Communication: updated daughter [name] over the phone Disposition Plan: Status is: Inpatient  Remains inpatient appropriate because:Ongoing diagnostic testing needed not appropriate for outpatient work up, IV treatments appropriate due to intensity of  illness or inability to take PO, and Inpatient level of care appropriate due to severity of illness  Dispo: The patient is from: Home              Anticipated d/c is  to: Home              Patient currently is not medically stable to d/c.   Difficult to place patient No         Consultants:  Nephrology  Procedures:    Antimicrobials:     Subjective: Patient is somnolent, she wakes up to voice. She says she feels more short of breath. Blood sugars noted to be in 70s this morning. She says she has been making urine overnight, Although I do not see any output recorded.   Objective: Vitals:   06/02/21 0805 06/02/21 0914 06/02/21 0919 06/02/21 0942  BP: (!) 134/41 (!) 129/57  (!) 148/56  Pulse: (!) 57 (!) 59  65  Resp: 16 15  (!) 28  Temp: 97.6 F (36.4 C) 98.1 F (36.7 C)  98.5 F (36.9 C)  TempSrc: Oral Oral  Oral  SpO2: 93% (!) 87% 92% 100%   No intake or output data in the 24 hours ending 06/02/21 1014 There were no vitals filed for this visit.  Examination:  General exam: Appears calm and comfortable  Respiratory system: crackles and decreased breath sounds at bases, increased resp effort Cardiovascular system: S1 & S2 heard, RRR. No JVD, murmurs, rubs, gallops or clicks. No pedal edema. Gastrointestinal system: Abdomen is nondistended, soft and nontender. No organomegaly or masses felt. Normal bowel sounds heard. Central nervous system: Alert and oriented. No focal neurological deficits. Extremities: Symmetric 5 x 5 power. Skin: No rashes, lesions or ulcers Psychiatry: Judgement and insight appear normal. Mood & affect appropriate.     Data Reviewed: I have personally reviewed following labs and imaging studies  CBC: Recent Labs  Lab 06/01/21 0324  WBC 7.1  NEUTROABS 4.8  HGB 7.6*  HCT 25.0*  MCV 99.2  PLT [id number]   Basic Metabolic Panel: Recent Labs  Lab 06/01/21 0145 06/02/21 0103  NA 131* 129*  K 4.3 4.2  CL 100 98  CO2 21* 22  GLUCOSE 54* 100*  BUN 43* 44*  CREATININE 4.06* 4.33*  CALCIUM 8.8* 8.7*   GFR: Estimated Creatinine Clearance: 10.8 mL/min (A) (by C-G formula based on SCr of 4.33 mg/dL  (H)). Liver Function Tests: Recent Labs  Lab 06/01/21 0145  AST 27  ALT 12  ALKPHOS 127*  BILITOT 0.9  PROT 7.0  ALBUMIN 3.4*   No results for input(s): LIPASE, AMYLASE in the last 168 hours. No results for input(s): AMMONIA in the last 168 hours. Coagulation Profile: No results for input(s): INR, PROTIME in the last 168 hours. Cardiac Enzymes: No results for input(s): CKTOTAL, CKMB, CKMBINDEX, TROPONINI in the last 168 hours. BNP (last 3 results) No results for input(s): PROBNP in the last 8760 hours. HbA1C: No results for input(s): HGBA1C in the last 72 hours. CBG: Recent Labs  Lab 06/02/21 0511 06/02/21 0608 06/02/21 0717 06/02/21 0848 06/02/21 0921  GLUCAP 87 85 85 78 313*   Lipid Profile: No results for input(s): CHOL, HDL, LDLCALC, TRIG, CHOLHDL, LDLDIRECT in the last 72 hours. Thyroid Function Tests: Recent Labs    06/02/21 0109  TSH 1.738  FREET4 0.89   Anemia Panel: No results for input(s): VITAMINB12, FOLATE, FERRITIN, TIBC, IRON, RETICCTPCT in the last 72 hours. Sepsis Labs: Recent Labs  Lab 06/01/21 0145  LATICACIDVEN 1.1    Recent Results (from the past 240 hour(s))  SARS CORONAVIRUS 2 (TAT 6-24 HRS) Nasopharyngeal Nasopharyngeal Swab     Status: None   Collection Time: 06/01/21  1:09 AM   Specimen: Nasopharyngeal Swab  Result Value Ref Range Status   SARS Coronavirus 2 NEGATIVE NEGATIVE Final    Comment: (NOTE) SARS-CoV-2 target nucleic acids are NOT DETECTED.  The SARS-CoV-2 RNA is generally detectable in upper and lower respiratory specimens during the acute phase of infection. Negative results do not preclude SARS-CoV-2 infection, do not rule out co-infections with other pathogens, and should not be used as the sole basis for treatment or other patient management decisions. Negative results must be combined with clinical observations, patient history, and epidemiological information. The expected result is Negative.  Fact Sheet for  Patients: SugarRoll.be  Fact Sheet for Healthcare Providers: https://www.woods-mathews.com/  This test is not yet approved or cleared by the [place] FDA and  has been authorized for detection and/or diagnosis of SARS-CoV-2 by FDA under an Emergency Use Authorization (EUA). This EUA will remain  in effect (meaning this test can be used) for the duration of the COVID-19 declaration under Se ction 564(b)(1) of the Act, 21 U.S.C. section 360bbb-3(b)(1), unless the authorization is terminated or revoked sooner.  Performed at [facility], [place] [address] [place], [place] [address]          Radiology Studies: US RENAL  Result Date: 06/01/2021 CLINICAL DATA:  Acute kidney injury, history of CKD stage IV EXAM: RENAL / URINARY TRACT ULTRASOUND COMPLETE COMPARISON:  Ultrasound 03/13/2021, CT abdomen pelvis 05/12/2021 FINDINGS: Right Kidney: Renal measurements: 8.8 x 3.5 x 4.5 cm = volume: 71.2 mL. Echogenicity is within normal limits. No concerning renal mass, shadowing calculus or hydronephrosis. Left Kidney: Renal measurements: 8.3 x 4.8 x 3.7 cm = volume: 63.8 mL. Echogenicity is within normal limits. Solid echogenic 8 x 6 x 8 mm echogenic mass present in the interpolar left kidney given the appearance of macroscopic fat on comparison CT imaging and stability from multiple prior exams, favor renal angiomyolipoma. No concerning renal mass, shadowing calculus or hydronephrosis. Bladder: Appears normal for degree of bladder distention. Other: Bilateral pleural effusions. Technically challenging exam due to altered mental status and inability to reposition in decubitus IMPRESSION: Echogenic 8 mm solid lesion in the interpolar left kidney corresponding well to a region of macroscopic fat on recent comparison CT images, and stable appearance from more remote CT comparison is as well, strongly favoring renal angiomyolipoma. Could consider 6-12 month  follow-up surveillance imaging. Bilateral pleural effusions. Otherwise unremarkable urinary tract ultrasound. Electronically Signed   By: Lovena Le M.D.   On: 06/01/2021 04:45   DG Chest Port 1 View  Result Date: 06/01/2021 CLINICAL DATA:  Shortness of breath. Altered mental status. Low blood sugar. EXAM: PORTABLE CHEST 1 VIEW COMPARISON:  05/12/2021 FINDINGS: Cardiac enlargement. Bilateral perihilar infiltration could represent pneumonia or edema. Small bilateral pleural effusions. Mediastinal contours appear intact. No pneumothorax. Calcification of the aorta. IMPRESSION: Cardiac enlargement with perihilar infiltrates and bilateral pleural effusions. Electronically Signed   By: Lucienne Capers M.D.   On: 06/01/2021 01:23        Scheduled Meds:  amLODipine  10 mg Oral Daily   aspirin  81 mg Oral Daily   atorvastatin  20 mg Oral Daily   calcium-vitamin D  2 tablet Oral Q breakfast   dextrose  1 ampule Intravenous Once   fluticasone  2 spray Each Nare Daily   furosemide  80 mg Intravenous Daily   heparin  5,000 Units Subcutaneous Q8H   hydrALAZINE  50 mg Oral TID   isosorbide mononitrate  60 mg Oral Daily   levothyroxine  150 mcg Oral Q0600   metoprolol succinate  25 mg Oral Daily   multivitamin with minerals  1 tablet Oral Daily   pantoprazole  80 mg Oral Daily   polyethylene glycol  17 g Oral Daily   sodium chloride flush  3 mL Intravenous Q12H   vitamin B-12  1,000 mcg Oral Daily   Continuous Infusions:  sodium chloride       LOS: 1 day    Time spent: 69mns    JKathie Dike MD [facility]   If 7PM-7AM, please contact night-coverage [email or web address]  06/02/2021, 10:14 AM   Addendum:  Patient noted to have elevated ammonia at 82.  Started on lactulose.  Repeat ammonia level in a.m.  [email or web address]

## 2021-06-02 NOTE — Progress Notes (Signed)
Ammonia level-82, MD aware with order.

## 2021-06-02 NOTE — Progress Notes (Addendum)
Patient still lethargic, oral meds held. Report given to [place], RN on [place]. Pt is transferred to 2C18. All belongs sent. Family updated of POC. Recheck CBG at 185.

## 2021-06-03 ENCOUNTER — Inpatient Hospital Stay (HOSPITAL_COMMUNITY): Payer: Medicaid Other

## 2021-06-03 ENCOUNTER — Other Ambulatory Visit: Payer: Self-pay

## 2021-06-03 DIAGNOSIS — I5033 Acute on chronic diastolic (congestive) heart failure: Secondary | ICD-10-CM

## 2021-06-03 LAB — URINALYSIS, ROUTINE W REFLEX MICROSCOPIC
Bilirubin Urine: NEGATIVE
Glucose, UA: NEGATIVE mg/dL
Ketones, ur: NEGATIVE mg/dL
Nitrite: NEGATIVE
Protein, ur: NEGATIVE mg/dL
Specific Gravity, Urine: 1.008 (ref 1.005–1.030)
pH: 5 (ref 5.0–8.0)

## 2021-06-03 LAB — GLUCOSE, CAPILLARY
Glucose-Capillary: 101 mg/dL — ABNORMAL HIGH (ref 70–99)
Glucose-Capillary: 109 mg/dL — ABNORMAL HIGH (ref 70–99)
Glucose-Capillary: 113 mg/dL — ABNORMAL HIGH (ref 70–99)
Glucose-Capillary: 115 mg/dL — ABNORMAL HIGH (ref 70–99)
Glucose-Capillary: 122 mg/dL — ABNORMAL HIGH (ref 70–99)
Glucose-Capillary: 146 mg/dL — ABNORMAL HIGH (ref 70–99)
Glucose-Capillary: 160 mg/dL — ABNORMAL HIGH (ref 70–99)
Glucose-Capillary: 72 mg/dL (ref 70–99)
Glucose-Capillary: 75 mg/dL (ref 70–99)
Glucose-Capillary: 81 mg/dL (ref 70–99)
Glucose-Capillary: 87 mg/dL (ref 70–99)
Glucose-Capillary: 95 mg/dL (ref 70–99)

## 2021-06-03 LAB — BASIC METABOLIC PANEL
Anion gap: 11 (ref 5–15)
BUN: 43 mg/dL — ABNORMAL HIGH (ref 8–23)
CO2: 25 mmol/L (ref 22–32)
Calcium: 8.8 mg/dL — ABNORMAL LOW (ref 8.9–10.3)
Chloride: 96 mmol/L — ABNORMAL LOW (ref 98–111)
Creatinine, Ser: 4.54 mg/dL — ABNORMAL HIGH (ref 0.44–1.00)
GFR, Estimated: 10 mL/min — ABNORMAL LOW (ref >60)
Glucose, Bld: 107 mg/dL — ABNORMAL HIGH (ref 70–99)
Potassium: 3.8 mmol/L (ref 3.5–5.1)
Sodium: 132 mmol/L — ABNORMAL LOW (ref 135–145)

## 2021-06-03 LAB — ACTH STIMULATION, 3 TIME POINTS
Cortisol, 30 Min: 6.5 ug/dL
Cortisol, 60 Min: 9.1 ug/dL
Cortisol, Base: 2.1 ug/dL

## 2021-06-03 LAB — ECHOCARDIOGRAM COMPLETE
AR max vel: 1.94 cm2
AV Area VTI: 1.7 cm2
AV Area mean vel: 1.85 cm2
AV Mean grad: 5 mmHg
AV Peak grad: 8.8 mmHg
Ao pk vel: 1.48 m/s
Area-P 1/2: 7.16 cm2
Calc EF: 59.5 %
S' Lateral: 3 cm
Single Plane A2C EF: 55.9 %
Single Plane A4C EF: 64.2 %
Weight: 1756.63 oz

## 2021-06-03 LAB — CBC
HCT: 22.9 % — ABNORMAL LOW (ref 36.0–46.0)
Hemoglobin: 7.3 g/dL — ABNORMAL LOW (ref 12.0–15.0)
MCH: 29.8 pg (ref 26.0–34.0)
MCHC: 31.9 g/dL (ref 30.0–36.0)
MCV: 93.5 fL (ref 80.0–100.0)
Platelets: 170 10*3/uL (ref 150–400)
RBC: 2.45 MIL/uL — ABNORMAL LOW (ref 3.87–5.11)
RDW: 14.2 % (ref 11.5–15.5)
WBC: 4.5 10*3/uL (ref 4.0–10.5)
nRBC: 0 % (ref 0.0–0.2)

## 2021-06-03 LAB — AMMONIA: Ammonia: 19 umol/L (ref 9–35)

## 2021-06-03 LAB — LACTIC ACID, PLASMA
Lactic Acid, Venous: 0.9 mmol/L (ref 0.5–1.9)
Lactic Acid, Venous: 0.7 mmol/L (ref 0.5–1.9)

## 2021-06-03 LAB — D-DIMER, QUANTITATIVE: D-Dimer, Quant: 3.73 ug/mL-FEU — ABNORMAL HIGH (ref 0.00–0.50)

## 2021-06-03 LAB — PROCALCITONIN: Procalcitonin: 0.24 ng/mL

## 2021-06-03 MED ORDER — ACETAMINOPHEN 325 MG PO TABS
650.0000 mg | ORAL_TABLET | Freq: Four times a day (QID) | ORAL | Status: DC | PRN
  Administered 2021-06-03: 650 mg via ORAL
  Filled 2021-06-03: qty 2

## 2021-06-03 MED ORDER — ACETAMINOPHEN 160 MG/5ML PO SOLN
650.0000 mg | Freq: Four times a day (QID) | ORAL | Status: DC | PRN
  Administered 2021-06-04 (×2): 650 mg via ORAL
  Filled 2021-06-03 (×2): qty 20.3

## 2021-06-03 MED ORDER — ACETAMINOPHEN 325 MG PO TABS
650.0000 mg | ORAL_TABLET | Freq: Four times a day (QID) | ORAL | Status: DC | PRN
  Administered 2021-06-06 – 2021-06-07 (×2): 650 mg via ORAL
  Filled 2021-06-03 (×2): qty 2

## 2021-06-03 NOTE — Hospital Course (Addendum)
60 y/o female with history of CKD stage 4, DM, bilateral RAS admitted with AMS related to hypoglycemia. She was noted to be short of breath and imaging showed bilateral pleural effusions, had worsening renal function.  Admitted to [facility] service with nephrology and pulmonology consulted.    Diuresed with IV Lasix under nephrology's management. Renal function slowly improved.   Hypoglycemia has resolved.    Lactulose started due to hyperammonemia potentially contributing to mental status changes.  Transferred to [facility] on 6/14 due to increased oxygen requirement and work of breathing.  She required on 10 L/min HFNC oxygen but was able to be weaned off oxygen and has been stable on room air.  Patient mental status improved, she has been fully oriented but is often tired and sleeping.    Blood culture positive for MSSA (single bottle) and was having fevers but no other infectious symptoms.  ID consulted and patient on Ancef.

## 2021-06-03 NOTE — Plan of Care (Signed)
  Problem: Education: Goal: Knowledge of General Education information will improve Description: Including pain rating scale, medication(s)/side effects and non-pharmacologic comfort measures Outcome: Progressing   Problem: Clinical Measurements: Goal: Ability to maintain clinical measurements within normal limits will improve Outcome: Progressing   Problem: Clinical Measurements: Goal: Diagnostic test results will improve Outcome: Progressing   Problem: Nutrition: Goal: Adequate nutrition will be maintained Outcome: Progressing   Problem: Pain Managment: Goal: General experience of comfort will improve Outcome: Progressing   Problem: Skin Integrity: Goal: Risk for impaired skin integrity will decrease Outcome: Progressing   

## 2021-06-03 NOTE — Plan of Care (Signed)

## 2021-06-03 NOTE — Progress Notes (Signed)
  [facility] Progress Note   Assessment/ Plan:    AKI on CKD IV: in the setting of hypoglyemic episode and CHF exacerbation on the background of RAS.  She will be very sensitive to even the smallest hypotensive episode in the setting of bilateral RAS.  Renal US without obstruction 06/01/21.   Plan: - priority will be fluid removal - stopped hydralazine - continue Lasix 80 mg IV BID - previously intervention not pursued for RAS- consider, would be more of a long-term plan - ordered UA and UP/C- pending - no indication for dialysis at present but will closely monitor   2.  dCHF exacerbation: metoprolol, imdur, stopped hydralazine as above as well as increased Lasix   3.  Hypoglycemia: off all oral hypoglycemics, CBGs have been adequate.  Q 2 CBG per primary  4.  Acute hypoxic RF: presumably to pulm edema- increased diuresis as above  5.  History of adrenal insufficiency: getting cosyntropin test per primary  6.  Possible HE: on lactulose    7.  Dispo: pending  Subjective:    Good diuresis yesterday on Lasix 80 IV BID.  Ammonia elevated to 82--> on lactulose TID.  Much more awake and alert today.     Objective:   BP (!) 125/37 (BP Location: Right Arm)   Pulse 64   Temp (!) 97.5 F (36.4 C) (Oral)   Resp 19   Wt 49.8 kg   SpO2 97%   BMI 18.85 kg/m   Physical Exam: GEN: sitting in bed, NAD HEENT EOMI PERRL NECK + JVD, improving PULM bilateral crackles improving CV RRR ABD soft, mildly diffusely tender EXT no LE edema  Labs: BMET Recent Labs  Lab 06/01/21 0145 06/02/21 0103 06/03/21 0204  NA 131* 129* 132*  K 4.3 4.2 3.8  CL 100 98 96*  CO2 21* 22 25  GLUCOSE 54* 100* 107*  BUN 43* 44* 43*  CREATININE 4.06* 4.33* 4.54*  CALCIUM 8.8* 8.7* 8.8*   CBC Recent Labs  Lab 06/01/21 0324 06/03/21 0204  WBC 7.1 4.5  NEUTROABS 4.8  --   HGB 7.6* 7.3*  HCT 25.0* 22.9*  MCV 99.2 93.5  PLT 185 170      Medications:     amLODipine  10 mg Oral  Daily   aspirin  81 mg Oral Daily   atorvastatin  20 mg Oral Daily   calcium-vitamin D  2 tablet Oral Q breakfast   Chlorhexidine Gluconate Cloth  6 each Topical Daily   dextrose  1 ampule Intravenous Once   fluticasone  2 spray Each Nare Daily   furosemide  80 mg Intravenous BID   heparin  5,000 Units Subcutaneous Q8H   isosorbide mononitrate  60 mg Oral Daily   lactulose  20 g Oral TID   levothyroxine  150 mcg Oral Q0600   metoprolol succinate  25 mg Oral Daily   multivitamin with minerals  1 tablet Oral Daily   pantoprazole  80 mg Oral Daily   polyethylene glycol  17 g Oral Daily   sodium chloride flush  3 mL Intravenous Q12H   vitamin B-12  1,000 mcg Oral Daily     Madelon Lips MD 06/03/2021, 9:44 AM

## 2021-06-03 NOTE — Progress Notes (Addendum)
PROGRESS NOTE    [name]   [id number]  DOB: 1961/12/11  PCP: Ladell Pier, MD    DOA: 06/01/2021 LOS: 2   Assessment & Plan   Principal Problem:   AKI (acute kidney injury) ([place]) Active Problems:   CKD (chronic kidney disease) stage 4, GFR 15-29 ml/min (HCC)   HTN (hypertension)   Anemia due to chronic kidney disease   Acute on chronic diastolic CHF (congestive heart failure) (HCC)   DM2 (diabetes mellitus, type 2) (HCC)   Acute respiratory failure with hypoxia due to pulmonary edema and bilateral pleural effusions -  Hypoxic in ED and started on 2L >> 6L, not on home O2. Further increased oxygen needs, on 10 L/min HFNC. ABG normal pCO2 and pH, hypoxemic w/ pO2 54. CXR with persistent bilateral pleural effusions and improving but not resolved b/l infiltrates. --Continue close monitoring in Progressive --Supplement O2, target sat > 90%, wean as tolerated --Treat underlying conditions as below (renal failure, CHF, ?PNA given new fever this afternoon) -- Ordered procal, lactate, dimer blood cultures due to fever -- New fever - ?PNA vs ?PE.  (UA normal on admission)   AKI superimposed on CKD stage 4 -  Baseline Cr appears ~2.5.  Presented with Cr 4.0 -> 4.3. Renal U/S negative for hydronephrosis / obstruction. --Nephrology following, appreciate input --Foley for I/O's per prior attending, d/c asap   Acute on chronic diastolic CHF - with hypoxia due to pleural effusions and pulmonary edema, elevated BNP.  On IV Lasix per nephrology. Net IO Since Admission: -1,665 mL [06/03/21 1551] --Diuretics per nephrology --Strict I/O's and daily weights --Follow up pending Echo --Continue metoprolol, Imdur --Hydralazine stopped --On IV Lasix 80 mg BID  Acute metabolic encephalopathy - likely multifactorial due to hypoglycemia, hyperammonemia, hypoxia.   --Treat underlying causes as above/below --Continue lactulose --Delirium precautions:     -Lights and TV off,  minimize interruptions at night    -Blinds open and lights on during day    -Glasses/hearing aid with patient    -Frequent reorientation    -PT/OT when able    -Avoid sedation medications/Beers list medications   Hypoglycemia / Type 2 diabetes -  Possibly 2/2 Prandin in the setting of worsening renal failure vs. adrenal insufficiency.  Given D50 in the ED. CBG's today in low 100's, improved from 70-80's --Monitor closely --Hold antihyperglycemic agents --Hypoglycemia protocol  Hyponatremia - suspect multifactorial due to volume overload and ?adrenal insufficiency.  IMPROVED.  Na 129 >>132. TSH normal. --AI under evaluation as below --Follow BMP daily  Hyperammonemia ? Hepatic encephalopathy --Continue lactulose --Titrate lactulose dosing for 3+ soft BM's daily   Possible history of adrenal insufficiency Pt follows with Dr. Loanne Drilling.   Previously on Solu-cortef, appears was d/c'd at last visit. Cortisol level on admission is 2.5 Prior attending discussed with Dr. Dwyane Dee, on call for endocrinology, recommended Cosyntropin stim test. This was not properly collected and was re-ordered for this AM. --ACTH levels and stim test pending - follow --Hold off starting steroid for now, concern for ?infection given fever today (6/15)   Hypothyroidism chronic, controlled. TSH normal, FT4 normal. --Continue levothyroxine   Anemia of chronic disease - baseline Hbg 7.8.  No active bleeding. --Follow CBC daily   HTN - chronic, stable.  Due to Bilateral RAS. --Continue metoprolol, amlodipine --Renal stopped hydralazine  --Currently on IV Lasix 80 mg BID --Consider intervention for RAS     Patient BMI: Body mass index is 18.85 kg/m.   DVT prophylaxis: heparin  injection 5,000 Units Start: 06/01/21 0600   Diet:  Diet Orders (From admission, onward)     Start     Ordered   06/03/21 0947  Diet Carb Modified Fluid consistency: Thin; Room service appropriate? Yes; Fluid restriction: 1500  mL Fluid  Diet effective now       Question Answer Comment  Diet-HS Snack? Nothing   Calorie Level Medium 1600-2000   Fluid consistency: Thin   Room service appropriate? Yes   Fluid restriction: 1500 mL Fluid      06/03/21 0946              Code Status: Full Code   Brief Narrative / Hospital Course to Date:   60 y/o female with history of CKD stage 4, DM, bilateral RAS admitted with AMS related to hypoglycemia. She was noted to be short of breath and imaging showed bilateral pleural effusions, had worsening renal function.  Admitted to [facility] service with nephrology and pulmonology consulted.    Undergoing diuresis with IV Lasix.  z Hypoglycemia has improved.   Patient remains encephalopathic.   Lactulose started due to hyperammonemia potentially contributing to mental status changes.  Transferred to [facility] on 6/14 due to increased oxygen requirement and work of breathing.  Remains on 10 L/min HFNC oxygen.  Pulmonology following.      Subjective 06/03/21    Pt sleeping but intermittently groaning when seen this AM.  She does wake briefly to voice, answers questions and follow commands. Denies pain or feeling sick.  Says just tired.   Disposition Plan & Communication   Status is: Inpatient  Remains inpatient appropriate because:IV treatments appropriate due to intensity of illness or inability to take PO  Dispo: The patient is from: Home              Anticipated d/c is to: Home              Patient currently is not medically stable to d/c.   Difficult to place patient No   Family Communication: None present   Consults, Procedures, Significant Events   Consultants:  Nephrology PCCM  Procedures:     Antimicrobials:  Anti-infectives (From admission, onward)    None         Micro    Objective   Vitals:   06/03/21 0336 06/03/21 0418 06/03/21 0713 06/03/21 1107  BP: (!) 124/111  (!) 125/37 (!) 135/54  Pulse: 66  64 72  Resp: '20  19 18   '$ Temp: 98.9 F (37.2 C)  (!) 97.5 F (36.4 C) 99.6 F (37.6 C)  TempSrc: Oral  Oral Oral  SpO2: 97%  97% 96%  Weight:  49.8 kg      Intake/Output Summary (Last 24 hours) at 06/03/2021 1457 Last data filed at 06/03/2021 0817 Gross per 24 hour  Intake 600 ml  Output 1935 ml  Net -1335 ml   Filed Weights   06/03/21 0418  Weight: 49.8 kg    Physical Exam:  General exam: Sleeping comfortably, wakes briefly to voice, no acute distress Respiratory system: Diminished bases but overall clear, no wheezes, normal respiratory effort, on 10 L/min HFNC oxygen. Cardiovascular system: normal S1/S2, RRR, no pedal edema.   Gastrointestinal system: soft, NT, ND Central nervous system: Somnolent but arousable.  Oriented x3. no gross focal neurologic deficits, normal speech Extremities: moves all, no edema, normal tone Skin: dry, intact, normal temperature Psychiatry: normal mood, congruent affect, judgement and insight appear normal  Labs   Data  Reviewed: I have personally reviewed following labs and imaging studies  CBC: Recent Labs  Lab 06/01/21 0324 06/03/21 0204  WBC 7.1 4.5  NEUTROABS 4.8  --   HGB 7.6* 7.3*  HCT 25.0* 22.9*  MCV 99.2 93.5  PLT 185 [id number]   Basic Metabolic Panel: Recent Labs  Lab 06/01/21 0145 06/02/21 0103 06/03/21 0204  NA 131* 129* 132*  K 4.3 4.2 3.8  CL 100 98 96*  CO2 21* 22 25  GLUCOSE 54* 100* 107*  BUN 43* 44* 43*  CREATININE 4.06* 4.33* 4.54*  CALCIUM 8.8* 8.7* 8.8*   GFR: Estimated Creatinine Clearance: 10.2 mL/min (A) (by C-G formula based on SCr of 4.54 mg/dL (H)). Liver Function Tests: Recent Labs  Lab 06/01/21 0145  AST 27  ALT 12  ALKPHOS 127*  BILITOT 0.9  PROT 7.0  ALBUMIN 3.4*   No results for input(s): LIPASE, AMYLASE in the last 168 hours. Recent Labs  Lab 06/02/21 0957 06/03/21 0204  AMMONIA 82* 19   Coagulation Profile: No results for input(s): INR, PROTIME in the last 168 hours. Cardiac Enzymes: No results for  input(s): CKTOTAL, CKMB, CKMBINDEX, TROPONINI in the last 168 hours. BNP (last 3 results) No results for input(s): PROBNP in the last 8760 hours. HbA1C: No results for input(s): HGBA1C in the last 72 hours. CBG: Recent Labs  Lab 06/03/21 0636 06/03/21 0803 06/03/21 1012 06/03/21 1217 06/03/21 1417  GLUCAP 75 72 101* 109* 115*   Lipid Profile: No results for input(s): CHOL, HDL, LDLCALC, TRIG, CHOLHDL, LDLDIRECT in the last 72 hours. Thyroid Function Tests: Recent Labs    06/02/21 0109  TSH 1.738  FREET4 0.89   Anemia Panel: No results for input(s): VITAMINB12, FOLATE, FERRITIN, TIBC, IRON, RETICCTPCT in the last 72 hours. Sepsis Labs: Recent Labs  Lab 06/01/21 0145  LATICACIDVEN 1.1    Recent Results (from the past 240 hour(s))  SARS CORONAVIRUS 2 (TAT 6-24 HRS) Nasopharyngeal Nasopharyngeal Swab     Status: None   Collection Time: 06/01/21  1:09 AM   Specimen: Nasopharyngeal Swab  Result Value Ref Range Status   SARS Coronavirus 2 NEGATIVE NEGATIVE Final    Comment: (NOTE) SARS-CoV-2 target nucleic acids are NOT DETECTED.  The SARS-CoV-2 RNA is generally detectable in upper and lower respiratory specimens during the acute phase of infection. Negative results do not preclude SARS-CoV-2 infection, do not rule out co-infections with other pathogens, and should not be used as the sole basis for treatment or other patient management decisions. Negative results must be combined with clinical observations, patient history, and epidemiological information. The expected result is Negative.  Fact Sheet for Patients: SugarRoll.be  Fact Sheet for Healthcare Providers: https://www.woods-mathews.com/  This test is not yet approved or cleared by the [place] FDA and  has been authorized for detection and/or diagnosis of SARS-CoV-2 by FDA under an Emergency Use Authorization (EUA). This EUA will remain  in effect (meaning this  test can be used) for the duration of the COVID-19 declaration under Se ction 564(b)(1) of the Act, 21 U.S.C. section 360bbb-3(b)(1), unless the authorization is terminated or revoked sooner.  Performed at [facility], [place] [address], [place], [place] [address]       Imaging Studies   DG CHEST PORT 1 VIEW  Result Date: 06/02/2021 CLINICAL DATA:  60 year old female with shortness of breath. Acute kidney injury. EXAM: PORTABLE CHEST 1 VIEW COMPARISON:  Portable chest 06/01/2021 and earlier. FINDINGS: Portable AP upright view at 1004 hours. Stable  lung volumes and mediastinal contours. Cardiomegaly as demonstrated in May. Regressed but not resolved bilateral pulmonary edema with small bilateral pleural effusions. No pneumothorax. No consolidation identified. No acute osseous abnormality identified. Negative visible bowel gas pattern. IMPRESSION: Cardiomegaly. Regressed but not resolved pulmonary edema since yesterday with small bilateral pleural effusions. Electronically Signed   By: Genevie Ann M.D.   On: 06/02/2021 10:28     Medications   Scheduled Meds:  amLODipine  10 mg Oral Daily   aspirin  81 mg Oral Daily   atorvastatin  20 mg Oral Daily   calcium-vitamin D  2 tablet Oral Q breakfast   Chlorhexidine Gluconate Cloth  6 each Topical Daily   dextrose  1 ampule Intravenous Once   fluticasone  2 spray Each Nare Daily   furosemide  80 mg Intravenous BID   heparin  5,000 Units Subcutaneous Q8H   isosorbide mononitrate  60 mg Oral Daily   lactulose  20 g Oral TID   levothyroxine  150 mcg Oral Q0600   metoprolol succinate  25 mg Oral Daily   multivitamin with minerals  1 tablet Oral Daily   pantoprazole  80 mg Oral Daily   polyethylene glycol  17 g Oral Daily   sodium chloride flush  3 mL Intravenous Q12H   vitamin B-12  1,000 mcg Oral Daily   Continuous Infusions:  sodium chloride         LOS: 2 days    Time spent: 38 minutes    Ezekiel Slocumb, DO [facility]  06/03/2021, 2:57 PM      If 7PM-7AM, please contact night-coverage. How to contact the [facility] Attending or Consulting provider [place] or covering provider during after hours [place], for this patient?    Check the care team in [facility] and look for a) attending/consulting [facility] provider listed and b) the [facility] team listed Log into [email or web address] and use [place]'s universal password to access. If you do not have the password, please contact the hospital operator. Locate the [facility] provider you are looking for under [facility] and page to a number that you can be directly reached. If you still have difficulty reaching the provider, please page the [facility] (Director on Call) for the Hospitalists listed on amion for assistance.

## 2021-06-03 NOTE — Progress Notes (Signed)
*  PRELIMINARY RESULTS* Echocardiogram 2D Echocardiogram has been performed.  Luisa Hart RDCS 06/03/2021, 2:19 PM

## 2021-06-04 ENCOUNTER — Inpatient Hospital Stay (HOSPITAL_COMMUNITY): Payer: Medicaid Other

## 2021-06-04 LAB — BLOOD CULTURE ID PANEL (REFLEXED) - BCID2

## 2021-06-04 LAB — GLUCOSE, CAPILLARY
Glucose-Capillary: 103 mg/dL — ABNORMAL HIGH (ref 70–99)
Glucose-Capillary: 108 mg/dL — ABNORMAL HIGH (ref 70–99)
Glucose-Capillary: 110 mg/dL — ABNORMAL HIGH (ref 70–99)
Glucose-Capillary: 117 mg/dL — ABNORMAL HIGH (ref 70–99)
Glucose-Capillary: 117 mg/dL — ABNORMAL HIGH (ref 70–99)
Glucose-Capillary: 123 mg/dL — ABNORMAL HIGH (ref 70–99)
Glucose-Capillary: 140 mg/dL — ABNORMAL HIGH (ref 70–99)
Glucose-Capillary: 154 mg/dL — ABNORMAL HIGH (ref 70–99)
Glucose-Capillary: 85 mg/dL (ref 70–99)
Glucose-Capillary: 89 mg/dL (ref 70–99)
Glucose-Capillary: 91 mg/dL (ref 70–99)
Glucose-Capillary: 98 mg/dL (ref 70–99)

## 2021-06-04 LAB — CBC WITH DIFFERENTIAL/PLATELET
Abs Immature Granulocytes: 0.02 10*3/uL (ref 0.00–0.07)
Basophils Absolute: 0 10*3/uL (ref 0.0–0.1)
Basophils Relative: 1 %
Eosinophils Absolute: 0.1 10*3/uL (ref 0.0–0.5)
Eosinophils Relative: 2 %
HCT: 24.5 % — ABNORMAL LOW (ref 36.0–46.0)
Hemoglobin: 7.9 g/dL — ABNORMAL LOW (ref 12.0–15.0)
Immature Granulocytes: 0 %
Lymphocytes Relative: 17 %
Lymphs Abs: 1.2 10*3/uL (ref 0.7–4.0)
MCH: 29.8 pg (ref 26.0–34.0)
MCHC: 32.2 g/dL (ref 30.0–36.0)
MCV: 92.5 fL (ref 80.0–100.0)
Monocytes Absolute: 0.5 10*3/uL (ref 0.1–1.0)
Monocytes Relative: 7 %
Neutro Abs: 5.3 10*3/uL (ref 1.7–7.7)
Neutrophils Relative %: 73 %
Platelets: 172 10*3/uL (ref 150–400)
RBC: 2.65 MIL/uL — ABNORMAL LOW (ref 3.87–5.11)
RDW: 14 % (ref 11.5–15.5)
WBC: 7.1 10*3/uL (ref 4.0–10.5)
nRBC: 0 % (ref 0.0–0.2)

## 2021-06-04 LAB — PROTIME-INR
INR: 1.2 (ref 0.8–1.2)
Prothrombin Time: 15.3 seconds — ABNORMAL HIGH (ref 11.4–15.2)

## 2021-06-04 LAB — BASIC METABOLIC PANEL
Anion gap: 10 (ref 5–15)
BUN: 41 mg/dL — ABNORMAL HIGH (ref 8–23)
CO2: 26 mmol/L (ref 22–32)
Calcium: 8.5 mg/dL — ABNORMAL LOW (ref 8.9–10.3)
Chloride: 97 mmol/L — ABNORMAL LOW (ref 98–111)
Creatinine, Ser: 4.18 mg/dL — ABNORMAL HIGH (ref 0.44–1.00)
GFR, Estimated: 12 mL/min — ABNORMAL LOW (ref >60)
Glucose, Bld: 112 mg/dL — ABNORMAL HIGH (ref 70–99)
Potassium: 3.5 mmol/L (ref 3.5–5.1)
Sodium: 133 mmol/L — ABNORMAL LOW (ref 135–145)

## 2021-06-04 LAB — HEPATIC FUNCTION PANEL
ALT: 17 U/L (ref 0–44)
AST: 51 U/L — ABNORMAL HIGH (ref 15–41)
Albumin: 3 g/dL — ABNORMAL LOW (ref 3.5–5.0)
Alkaline Phosphatase: 88 U/L (ref 38–126)
Bilirubin, Direct: 0.2 mg/dL (ref 0.0–0.2)
Indirect Bilirubin: 1.2 mg/dL — ABNORMAL HIGH (ref 0.3–0.9)
Total Bilirubin: 1.4 mg/dL — ABNORMAL HIGH (ref 0.3–1.2)
Total Protein: 6.3 g/dL — ABNORMAL LOW (ref 6.5–8.1)

## 2021-06-04 LAB — ACTH: C206 ACTH: 4.3 pg/mL — ABNORMAL LOW (ref 7.2–63.3)

## 2021-06-04 LAB — AMMONIA: Ammonia: 44 umol/L — ABNORMAL HIGH (ref 9–35)

## 2021-06-04 MED ORDER — TECHNETIUM TO 99M ALBUMIN AGGREGATED
4.0000 | Freq: Once | INTRAVENOUS | Status: AC | PRN
  Administered 2021-06-04: 4 via INTRAVENOUS

## 2021-06-04 MED ORDER — CEFAZOLIN SODIUM-DEXTROSE 2-4 GM/100ML-% IV SOLN
2.0000 g | Freq: Two times a day (BID) | INTRAVENOUS | Status: AC
  Administered 2021-06-04 – 2021-06-10 (×13): 2 g via INTRAVENOUS
  Filled 2021-06-04 (×14): qty 100

## 2021-06-04 NOTE — Progress Notes (Signed)
  [facility] Progress Note   Assessment/ Plan:    AKI on CKD IV: in the setting of hypoglyemic episode and CHF exacerbation on the background of RAS.  She will be very sensitive to even the smallest hypotensive episode in the setting of bilateral RAS.  Renal US without obstruction 06/01/21.   Plan: - priority will be fluid removal - stopped hydralazine - continue Lasix 80 mg IV BID - previously intervention not pursued for RAS- consider, would be more of a long-term plan - ordered UA and UP/C- pending - no indication for dialysis at present but will closely monitor - Cr improving and diuresis is going well, made 2.5L--> continue current   2.  dCHF exacerbation: metoprolol, imdur, stopped hydralazine as above as well as increased Lasix   3.  Hypoglycemia: off all oral hypoglycemics, CBGs have been adequate.  Q 2 CBG per primary  4.  Acute hypoxic RF: presumably to pulm edema- increased diuresis as above  5.  History of adrenal insufficiency: getting cosyntropin test per primary  6.  Possible HE: on lactulose   7.  New fever- on 6/15.  Blood cultures ordered per primary, consider repeat CXR. UA looks OK.  Per primary   8.  Dispo: pending  Subjective:    Febrile to 103 yesterday, blood cultures drawn.  No complaints today   Objective:   BP (!) 115/37 (BP Location: Left Arm)   Pulse 70   Temp 98.6 F (37 C) (Oral)   Resp 13   Wt 49.8 kg   SpO2 96%   BMI 18.85 kg/m   Physical Exam: GEN: lying in bed, NAD HEENT EOMI PERRL NECK +JVD much improved PULM bilateral crackles faint bibasilar CV RRR ABD soft, nontender EXT no LE edema  Labs: BMET Recent Labs  Lab 06/01/21 0145 06/02/21 0103 06/03/21 0204 06/04/21 0021  NA 131* 129* 132* 133*  K 4.3 4.2 3.8 3.5  CL 100 98 96* 97*  CO2 21* '22 25 26  '$ GLUCOSE 54* 100* 107* 112*  BUN 43* 44* 43* 41*  CREATININE 4.06* 4.33* 4.54* 4.18*  CALCIUM 8.8* 8.7* 8.8* 8.5*   CBC Recent Labs  Lab 06/01/21 0324  06/03/21 0204 06/04/21 0021  WBC 7.1 4.5 7.1  NEUTROABS 4.8  --  5.3  HGB 7.6* 7.3* 7.9*  HCT 25.0* 22.9* 24.5*  MCV 99.2 93.5 92.5  PLT 185 170 172      Medications:     amLODipine  10 mg Oral Daily   aspirin  81 mg Oral Daily   atorvastatin  20 mg Oral Daily   calcium-vitamin D  2 tablet Oral Q breakfast   Chlorhexidine Gluconate Cloth  6 each Topical Daily   dextrose  1 ampule Intravenous Once   fluticasone  2 spray Each Nare Daily   furosemide  80 mg Intravenous BID   heparin  5,000 Units Subcutaneous Q8H   isosorbide mononitrate  60 mg Oral Daily   lactulose  20 g Oral TID   levothyroxine  150 mcg Oral Q0600   metoprolol succinate  25 mg Oral Daily   multivitamin with minerals  1 tablet Oral Daily   pantoprazole  80 mg Oral Daily   polyethylene glycol  17 g Oral Daily   sodium chloride flush  3 mL Intravenous Q12H   vitamin B-12  1,000 mcg Oral Daily     Madelon Lips MD 06/04/2021, 8:56 AM

## 2021-06-04 NOTE — Progress Notes (Signed)
PHARMACY - PHYSICIAN COMMUNICATION CRITICAL VALUE ALERT - BLOOD CULTURE IDENTIFICATION (BCID)  [name] is an 60 y.o. female who presented to [facility] on 06/01/2021 with a chief complaint of hypoglycemia and AMS, decreased appetite, fatigue, and shortness of breath.   Assessment:  Blood cultures results pending - 1 out of 4 with GPC in clusters. BCID with MSSA. WBC is within normal limits. Tmax 101.8. BP is soft. AKI noted. UA rare bacteria.   Name of physician (or Provider) Contacted: T. Opyd, MD  Current antibiotics: None  Changes to prescribed antibiotics recommended: Add Ancef Recommendations accepted by provider  Results for orders placed or performed during the hospital encounter of 06/01/21  Blood Culture ID Panel (Reflexed) (Collected: 06/03/2021  3:49 PM)  Result Value Ref Range   Enterococcus faecalis NOT DETECTED NOT DETECTED   Enterococcus Faecium NOT DETECTED NOT DETECTED   Listeria monocytogenes NOT DETECTED NOT DETECTED   Staphylococcus species DETECTED (A) NOT DETECTED   Staphylococcus aureus (BCID) DETECTED (A) NOT DETECTED   Staphylococcus epidermidis NOT DETECTED NOT DETECTED   Staphylococcus lugdunensis NOT DETECTED NOT DETECTED   Streptococcus species NOT DETECTED NOT DETECTED   Streptococcus agalactiae NOT DETECTED NOT DETECTED   Streptococcus pneumoniae NOT DETECTED NOT DETECTED   Streptococcus pyogenes NOT DETECTED NOT DETECTED   A.calcoaceticus-baumannii NOT DETECTED NOT DETECTED   Bacteroides fragilis NOT DETECTED NOT DETECTED   Enterobacterales NOT DETECTED NOT DETECTED   Enterobacter cloacae complex NOT DETECTED NOT DETECTED   Escherichia coli NOT DETECTED NOT DETECTED   Klebsiella aerogenes NOT DETECTED NOT DETECTED   Klebsiella oxytoca NOT DETECTED NOT DETECTED   Klebsiella pneumoniae NOT DETECTED NOT DETECTED   Proteus species NOT DETECTED NOT DETECTED   Salmonella species NOT DETECTED NOT DETECTED   Serratia marcescens NOT DETECTED NOT  DETECTED   Haemophilus influenzae NOT DETECTED NOT DETECTED   Neisseria meningitidis NOT DETECTED NOT DETECTED   Pseudomonas aeruginosa NOT DETECTED NOT DETECTED   Stenotrophomonas maltophilia NOT DETECTED NOT DETECTED   Candida albicans NOT DETECTED NOT DETECTED   Candida auris NOT DETECTED NOT DETECTED   Candida glabrata NOT DETECTED NOT DETECTED   Candida krusei NOT DETECTED NOT DETECTED   Candida parapsilosis NOT DETECTED NOT DETECTED   Candida tropicalis NOT DETECTED NOT DETECTED   Cryptococcus neoformans/gattii NOT DETECTED NOT DETECTED   Meth resistant mecA/C and MREJ NOT DETECTED NOT DETECTED    Brain Hilts 06/04/2021  7:17 PM

## 2021-06-04 NOTE — Progress Notes (Signed)
Pharmacy Antibiotic Note  [name] is a 60 y.o. female admitted on 06/01/2021 with SOB, fatigue, and hypoglycemia. Pharmacy has been consulted for cefazolin dosing for MSSA bacteremia. Patient also with AKI on CKD Stage IV. Tmax 103.1. WBC wnl. No current antibiotics.   Plan: Start cefazolin 2g q12h  Monitor renal function, cultures, and clinical progression  Weight: 49.8 kg (109 lb 12.6 oz)  Temp (24hrs), Avg:99 F (37.2 C), Min:98.5 F (36.9 C), Max:100.7 F (38.2 C)  Recent Labs  Lab 06/01/21 0145 06/01/21 0324 06/02/21 0103 06/03/21 0204 06/03/21 1549 06/03/21 1846 06/04/21 0021  WBC  --  7.1  --  4.5  --   --  7.1  CREATININE 4.06*  --  4.33* 4.54*  --   --  4.18*  LATICACIDVEN 1.1  --   --   --  0.9 0.7  --     Estimated Creatinine Clearance: 11.1 mL/min (A) (by C-G formula based on SCr of 4.18 mg/dL (H)).    Allergies  Allergen Reactions   Beef-Derived Products     RELIGOUS  REASONS   Pork-Derived Products     RELIGIOUS REASONS   Percocet [Oxycodone-Acetaminophen] Rash    Antimicrobials this admission: Cefazolin 6/16 >>   Dose adjustments this admission: N/a  Microbiology results: 6/15 bcx: 1/4 MSSA  Thank you for allowing pharmacy to be a part of this patient's care.  Cristela Felt, PharmD Clinical Pharmacist  06/04/2021 7:36 PM

## 2021-06-04 NOTE — Progress Notes (Signed)
PROGRESS NOTE    [name]   [id number]  DOB: 04-22-1961  PCP: Ladell Pier, MD    DOA: 06/01/2021 LOS: 3   Assessment & Plan   Principal Problem:   AKI (acute kidney injury) ([place]) Active Problems:   CKD (chronic kidney disease) stage 4, GFR 15-29 ml/min (HCC)   HTN (hypertension)   Anemia due to chronic kidney disease   Acute on chronic diastolic CHF (congestive heart failure) (HCC)   DM2 (diabetes mellitus, type 2) (HCC)   Acute respiratory failure with hypoxia due to pulmonary edema and bilateral pleural effusions -  Hypoxic in ED and started on 2L >> 6L, not on home O2. Further increased oxygen needs, on 10 L/min HFNC. ABG normal pCO2 and pH, hypoxemic w/ pO2 54. CXR with persistent bilateral pleural effusions and improving but not resolved b/l infiltrates. --Continue close monitoring in Progressive --Supplement O2, target sat > 90%, wean as tolerated --Treat underlying conditions as below (renal failure, CHF, ?PNA given new fever this afternoon) 6/15: procal 0/24, elevated D-dimer 6/16: V/Q scan negative for PE, consistent with CHF; afebrile today --follow blood cultures --left-side US-guided thoracentesis ordered for tomorrow --ongoing diuresis as below   AKI superimposed on CKD stage 4 -  Baseline Cr appears ~2.5.  Presented with Cr 4.0 -> 4.3. Renal U/S negative for hydronephrosis / obstruction. --Nephrology following, appreciate input --Foley for I/O's per prior attending, d/c asap   Acute on chronic diastolic CHF - with hypoxia due to pleural effusions and pulmonary edema, elevated BNP.   Echo 6/15: EF 55-60%, grade II diastolic dysfunction, biatrial dilation On IV Lasix per nephrology. Net IO Since Admission: -3,090 mL [06/04/21 1714] --Diuretics per nephrology --Strict I/O's and daily weights --Follow up pending Echo --Continue metoprolol, Imdur --Hydralazine stopped --On IV Lasix 80 mg BID - good UO continues  Acute metabolic  encephalopathy - likely multifactorial due to hypoglycemia, hyperammonemia, hypoxia.   --Treat underlying causes as above/below --Continue lactulose --Delirium precautions:     -Lights and TV off, minimize interruptions at night    -Blinds open and lights on during day    -Glasses/hearing aid with patient    -Frequent reorientation    -PT/OT when able    -Avoid sedation medications/[place] list medications   Hypoglycemia / Type 2 diabetes - improved. Most likely 2/2 Prandin in the setting of worsening renal failure vs. adrenal insufficiency.  Given D50 in the ED. CBG's in low 100's, some 70-80's --Monitor closely --Hold antihyperglycemic agents --Hypoglycemia protocol --CBG's q4h (from q2h)  Hyponatremia - suspect multifactorial due to volume overload and ?adrenal insufficiency.  IMPROVED.  Na 129 >>132>>133. TSH normal. --AI under evaluation as below --Follow BMP daily  Hyperammonemia ? Hepatic encephalopathy --Continue lactulose --Titrate lactulose dosing for 3+ soft BM's daily CT a/p on 05/12/21 with no liver or biliary abnormalities seen.   Possible history of adrenal insufficiency Pt follows with Dr. Loanne Drilling.   Previously on Solu-cortef, appears was d/c'd at last visit. Cortisol level on admission is 2.5 Prior attending discussed with Dr. Dwyane Dee, on call for endocrinology, recommended Cosyntropin stim test. This was not properly collected and was re-ordered for this AM. --ACTH levels and stim test pending - follow --Hold off starting steroid for now, concern for ?infection given fever today (6/15)   Hypothyroidism chronic, controlled. TSH normal, FT4 normal. --Continue levothyroxine   Anemia of chronic disease - baseline Hbg 7.8.  No active bleeding. --Follow CBC daily   HTN - chronic, stable.  Due to Bilateral  RAS. --Continue metoprolol, amlodipine --Renal stopped hydralazine  --Currently on IV Lasix 80 mg BID --Consider intervention for RAS     Patient BMI: Body  mass index is 18.85 kg/m.   DVT prophylaxis: heparin injection 5,000 Units Start: 06/01/21 0600   Diet:  Diet Orders (From admission, onward)     Start     Ordered   06/03/21 0947  Diet Carb Modified Fluid consistency: Thin; Room service appropriate? Yes; Fluid restriction: 1500 mL Fluid  Diet effective now       Question Answer Comment  Diet-HS Snack? Nothing   Calorie Level Medium 1600-2000   Fluid consistency: Thin   Room service appropriate? Yes   Fluid restriction: 1500 mL Fluid      06/03/21 0946              Code Status: Full Code   Brief Narrative / Hospital Course to Date:   60 y/o female with history of CKD stage 4, DM, bilateral RAS admitted with AMS related to hypoglycemia. She was noted to be short of breath and imaging showed bilateral pleural effusions, had worsening renal function.  Admitted to [facility] service with nephrology and pulmonology consulted.    Undergoing diuresis with IV Lasix.  z Hypoglycemia has improved.   Patient remains encephalopathic.   Lactulose started due to hyperammonemia potentially contributing to mental status changes.  Transferred to [facility] on 6/14 due to increased oxygen requirement and work of breathing.  Remains on 10 L/min HFNC oxygen.  Pulmonology following.      Subjective 06/04/21    Pt seemed more difficult to awaken today, had to sternal rub to get response.  She then wakes and asks to be left alone.  Reports a headache and feeling cold.  Denies any pain or any other complaint.     Disposition Plan & Communication   Status is: Inpatient  Remains inpatient appropriate because:IV treatments appropriate due to intensity of illness or inability to take PO  Dispo: The patient is from: Home              Anticipated d/c is to: Home              Patient currently is not medically stable to d/c.   Difficult to place patient No   Family Communication: None present   Consults, Procedures, Significant Events    Consultants:  Nephrology PCCM  Procedures:     Antimicrobials:  Anti-infectives (From admission, onward)    None         Micro    Objective   Vitals:   06/04/21 0237 06/04/21 0819 06/04/21 1124 06/04/21 1655  BP: (!) 134/47 (!) 115/37 (!) 139/38 (!) 110/44  Pulse: 72 70 75 60  Resp: '16 13 16 12  '$ Temp: 98.5 F (36.9 C) 98.6 F (37 C) 99.2 F (37.3 C) 98.5 F (36.9 C)  TempSrc: Oral Oral Axillary Oral  SpO2: 94% 96% 97% 91%  Weight:        Intake/Output Summary (Last 24 hours) at 06/04/2021 1714 Last data filed at 06/04/2021 0300 Gross per 24 hour  Intake --  Output 1425 ml  Net -1425 ml   Filed Weights   06/03/21 0418  Weight: 49.8 kg    Physical Exam:  General exam: Sleeping comfortably, needs sternal rub to arouse, wakes briefly, no acute distress Respiratory system: Diminished bases but overall clear, no wheezes, normal respiratory effort, on 10 L/min HFNC oxygen. Cardiovascular system: normal S1/S2, RRR, no pedal  edema.   Gastrointestinal system: soft, NT, ND Central nervous system: Somnolent.  Oriented x3. no gross focal neurologic deficits, normal speech, follows some basic commands but not interacting enough to do full neuro exam, pt resistant exam Extremities: moves all, left UE edema otherwise no edema of extremities Skin: dry, intact, warm Psychiatry: normal mood but irritable, congruent affect  Labs   Data Reviewed: I have personally reviewed following labs and imaging studies  CBC: Recent Labs  Lab 06/01/21 0324 06/03/21 0204 06/04/21 0021  WBC 7.1 4.5 7.1  NEUTROABS 4.8  --  5.3  HGB 7.6* 7.3* 7.9*  HCT 25.0* 22.9* 24.5*  MCV 99.2 93.5 92.5  PLT 185 170 [id number]   Basic Metabolic Panel: Recent Labs  Lab 06/01/21 0145 06/02/21 0103 06/03/21 0204 06/04/21 0021  NA 131* 129* 132* 133*  K 4.3 4.2 3.8 3.5  CL 100 98 96* 97*  CO2 21* '22 25 26  '$ GLUCOSE 54* 100* 107* 112*  BUN 43* 44* 43* 41*  CREATININE 4.06* 4.33* 4.54* 4.18*   CALCIUM 8.8* 8.7* 8.8* 8.5*   GFR: Estimated Creatinine Clearance: 11.1 mL/min (A) (by C-G formula based on SCr of 4.18 mg/dL (H)). Liver Function Tests: Recent Labs  Lab 06/01/21 0145 06/04/21 1044  AST 27 51*  ALT 12 17  ALKPHOS 127* 88  BILITOT 0.9 1.4*  PROT 7.0 6.3*  ALBUMIN 3.4* 3.0*   No results for input(s): LIPASE, AMYLASE in the last 168 hours. Recent Labs  Lab 06/02/21 0957 06/03/21 0204 06/04/21 0021  AMMONIA 82* 19 44*   Coagulation Profile: Recent Labs  Lab 06/04/21 1044  INR 1.2   Cardiac Enzymes: No results for input(s): CKTOTAL, CKMB, CKMBINDEX, TROPONINI in the last 168 hours. BNP (last 3 results) No results for input(s): PROBNP in the last 8760 hours. HbA1C: No results for input(s): HGBA1C in the last 72 hours. CBG: Recent Labs  Lab 06/04/21 0828 06/04/21 1005 06/04/21 1157 06/04/21 1407 06/04/21 1519  GLUCAP 110* 85 98 140* 108*   Lipid Profile: No results for input(s): CHOL, HDL, LDLCALC, TRIG, CHOLHDL, LDLDIRECT in the last 72 hours. Thyroid Function Tests: Recent Labs    06/02/21 0109  TSH 1.738  FREET4 0.89   Anemia Panel: No results for input(s): VITAMINB12, FOLATE, FERRITIN, TIBC, IRON, RETICCTPCT in the last 72 hours. Sepsis Labs: Recent Labs  Lab 06/01/21 0145 06/03/21 1549 06/03/21 1846  PROCALCITON  --  0.24  --   LATICACIDVEN 1.1 0.9 0.7    Recent Results (from the past 240 hour(s))  SARS CORONAVIRUS 2 (TAT 6-24 HRS) Nasopharyngeal Nasopharyngeal Swab     Status: None   Collection Time: 06/01/21  1:09 AM   Specimen: Nasopharyngeal Swab  Result Value Ref Range Status   SARS Coronavirus 2 NEGATIVE NEGATIVE Final    Comment: (NOTE) SARS-CoV-2 target nucleic acids are NOT DETECTED.  The SARS-CoV-2 RNA is generally detectable in upper and lower respiratory specimens during the acute phase of infection. Negative results do not preclude SARS-CoV-2 infection, do not rule out co-infections with other pathogens, and  should not be used as the sole basis for treatment or other patient management decisions. Negative results must be combined with clinical observations, patient history, and epidemiological information. The expected result is Negative.  Fact Sheet for Patients: SugarRoll.be  Fact Sheet for Healthcare Providers: https://www.woods-mathews.com/  This test is not yet approved or cleared by the [place] FDA and  has been authorized for detection and/or diagnosis of SARS-CoV-2 by FDA under an  Emergency Use Authorization (EUA). This EUA will remain  in effect (meaning this test can be used) for the duration of the COVID-19 declaration under Se ction 564(b)(1) of the Act, 21 U.S.C. section 360bbb-3(b)(1), unless the authorization is terminated or revoked sooner.  Performed at [facility], [place] [address], [place], [place] [address]   Culture, blood (routine x 2)     Status: None (Preliminary result)   Collection Time: 06/03/21  3:49 PM   Specimen: BLOOD  Result Value Ref Range Status   Specimen Description BLOOD LEFT ANTECUBITAL  Final   Special Requests   Final    BOTTLES DRAWN AEROBIC AND ANAEROBIC Blood Culture adequate volume   Culture  Setup Time   Final    GRAM POSITIVE COCCI IN CLUSTERS ANAEROBIC BOTTLE ONLY Organism ID to follow Performed at [facility], [place] [address] [place], [place] [address]    Culture PENDING  Incomplete   Report Status PENDING  Incomplete  Culture, blood (routine x 2)     Status: None (Preliminary result)   Collection Time: 06/03/21  4:04 PM   Specimen: BLOOD  Result Value Ref Range Status   Specimen Description BLOOD LEFT ANTECUBITAL  Final   Special Requests   Final    BOTTLES DRAWN AEROBIC AND ANAEROBIC Blood Culture adequate volume   Culture   Final    NO GROWTH < 24 HOURS Performed at [facility], [place] [address] [place], [place] [address]    Report Status PENDING  Incomplete       Imaging Studies   NM Pulmonary Perfusion  Result Date: 06/04/2021 CLINICAL DATA:  Shortness of breath EXAM: NUCLEAR MEDICINE PERFUSION LUNG SCAN TECHNIQUE: Perfusion images were obtained in multiple projections after intravenous injection of radiopharmaceutical. Ventilation scans intentionally deferred if perfusion scan and chest x-ray adequate for interpretation during COVID 19 epidemic. RADIOPHARMACEUTICALS:  4.0 mCi Tc-29mMAA IV COMPARISON:  Chest x-ray from earlier in the same day FINDINGS: No S-shaped defects are noted on perfusion images to suggest pulmonary embolism. IMPRESSION: No evidence of pulmonary embolism. Electronically Signed   By: MInez CatalinaM.D.   On: 06/04/2021 14:36   DG CHEST PORT 1 VIEW  Result Date: 06/04/2021 CLINICAL DATA:  Shortness of breath. History of congestive heart failure. EXAM: PORTABLE CHEST 1 VIEW COMPARISON:  06/02/2021 FINDINGS: Cardiomegaly. Mediastinal shadows are normal. Bilateral effusions, left larger than right, with lower lobe atelectasis and or pneumonia. Mild edema in the lungs elsewhere. IMPRESSION: Findings consistent with congestive heart failure. Enlarged cardiac silhouette. Bilateral effusions left larger than right. Atelectatic changes in the lower lungs left more than right. Mild edema pattern. Electronically Signed   By: MNelson ChimesM.D.   On: 06/04/2021 11:33   ECHOCARDIOGRAM COMPLETE  Result Date: 06/03/2021    ECHOCARDIOGRAM REPORT   Patient Name:   Suzanne JULSONDate of Exam: [id number] Rec #:  [id number]     Height:       64.0 in Accession #:    [id number]    Weight:       109.8 lb Date of Birth:  1Aug 02, 1962    BSA:          1.516 m Patient Age:    89years       BP:           135/54 mmHg Patient Gender: F              HR:  79 bpm. Exam Location:  Inpatient Procedure: Cardiac Doppler and Color Doppler Indications:    CHF  History:        Patient has prior history of Echocardiogram examinations. CHF;                  Risk Factors:Diabetes and Hypertension.  Sonographer:    Luisa Hart RDCS Referring Phys: [id number] [facility]  Sonographer Comments: Image acquisition challenging due to uncooperative patient. IMPRESSIONS  1. Left ventricular ejection fraction, by estimation, is 55 to 60%. The left ventricle has normal function. The left ventricle has no regional wall motion abnormalities. Left ventricular diastolic parameters are consistent with Grade II diastolic dysfunction (pseudonormalization).  2. Right ventricular systolic function is normal. The right ventricular size is normal.  3. Left atrial size was mildly dilated.  4. Right atrial size was moderately dilated.  5. The mitral valve is grossly normal. Mild mitral valve regurgitation. No evidence of mitral stenosis.  6. The aortic valve is grossly normal. Aortic valve regurgitation is not visualized. No aortic stenosis is present. Comparison(s): A prior study was performed on 11/21/20. Prior images reviewed side by side. No siginificant change. Aortic valve morphology better seen in 2021 study. FINDINGS  Left Ventricle: Left ventricular ejection fraction, by estimation, is 55 to 60%. The left ventricle has normal function. The left ventricle has no regional wall motion abnormalities. The left ventricular internal cavity size was normal in size. There is  no left ventricular hypertrophy. Left ventricular diastolic parameters are consistent with Grade II diastolic dysfunction (pseudonormalization). Right Ventricle: The right ventricular size is normal. No increase in right ventricular wall thickness. Right ventricular systolic function is normal. Left Atrium: Left atrial size was mildly dilated. Right Atrium: Right atrial size was moderately dilated. Pericardium: Trivial pericardial effusion is present. Mitral Valve: The mitral valve is grossly normal. Mild mitral valve regurgitation. No evidence of mitral valve stenosis. Tricuspid Valve: The tricuspid valve is normal in  structure. Tricuspid valve regurgitation is mild . No evidence of tricuspid stenosis. Aortic Valve: The aortic valve is grossly normal. Aortic valve regurgitation is not visualized. No aortic stenosis is present. Aortic valve mean gradient measures 5.0 mmHg. Aortic valve peak gradient measures 8.8 mmHg. Aortic valve area, by VTI measures 1.70 cm. Pulmonic Valve: The pulmonic valve was normal in structure. Pulmonic valve regurgitation is mild. No evidence of pulmonic stenosis. Aorta: The aortic root and ascending aorta are structurally normal, with no evidence of dilitation. IAS/Shunts: The atrial septum is grossly normal.  LEFT VENTRICLE PLAX 2D LVIDd:         5.40 cm     Diastology LVIDs:         3.00 cm     LV e' medial:    4.67 cm/s LV PW:         1.00 cm     LV E/e' medial:  31.5 LV IVS:        0.90 cm     LV e' lateral:   4.28 cm/s LVOT diam:     1.80 cm     LV E/e' lateral: 34.3 LV SV:         52 LV SV Index:   34 LVOT Area:     2.54 cm  LV Volumes (MOD) LV vol d, MOD A2C: 48.5 ml LV vol d, MOD A4C: 54.8 ml LV vol s, MOD A2C: 21.4 ml LV vol s, MOD A4C: 19.6 ml LV SV MOD A2C:     27.1  ml LV SV MOD A4C:     54.8 ml LV SV MOD BP:      31.6 ml RIGHT VENTRICLE RV Basal diam:  3.70 cm RV Mid diam:    3.40 cm RV S prime:     14.40 cm/s TAPSE (M-mode): 2.5 cm LEFT ATRIUM             Index       RIGHT ATRIUM           Index LA diam:        4.50 cm 2.97 cm/m  RA Area:     20.20 cm LA Vol (A2C):   37.2 ml 24.54 ml/m RA Volume:   62.30 ml  41.09 ml/m LA Vol (A4C):   79.9 ml 52.70 ml/m LA Biplane Vol: 57.9 ml 38.19 ml/m  AORTIC VALVE                    PULMONIC VALVE AV Area (Vmax):    1.94 cm     PV Vmax:       1.35 m/s AV Area (Vmean):   1.85 cm     PV Vmean:      92.900 cm/s AV Area (VTI):     1.70 cm     PV VTI:        0.271 m AV Vmax:           148.00 cm/s  PV Peak grad:  7.3 mmHg AV Vmean:          105.000 cm/s PV Mean grad:  4.0 mmHg AV VTI:            0.304 m AV Peak Grad:      8.8 mmHg AV Mean Grad:       5.0 mmHg LVOT Vmax:         113.00 cm/s LVOT Vmean:        76.300 cm/s LVOT VTI:          0.203 m LVOT/AV VTI ratio: 0.67  AORTA Ao Root diam: 2.60 cm Ao Asc diam:  2.40 cm MITRAL VALVE                TRICUSPID VALVE MV Area (PHT): 7.16 cm     TR Peak grad:   24.2 mmHg MV Decel Time: 106 msec     TR Vmax:        246.00 cm/s MV E velocity: 147.00 cm/s MV A velocity: 55.70 cm/s   SHUNTS MV E/A ratio:  2.64         Systemic VTI:  0.20 m                             Systemic Diam: 1.80 cm Rudean Haskell MD Electronically signed by Rudean Haskell MD Signature Date/Time: 06/03/2021/4:23:54 PM    Final      Medications   Scheduled Meds:  amLODipine  10 mg Oral Daily   aspirin  81 mg Oral Daily   atorvastatin  20 mg Oral Daily   calcium-vitamin D  2 tablet Oral Q breakfast   Chlorhexidine Gluconate Cloth  6 each Topical Daily   dextrose  1 ampule Intravenous Once   fluticasone  2 spray Each Nare Daily   furosemide  80 mg Intravenous BID   heparin  5,000 Units Subcutaneous Q8H   isosorbide mononitrate  60 mg Oral Daily   lactulose  20 g Oral TID  levothyroxine  150 mcg Oral Q0600   metoprolol succinate  25 mg Oral Daily   multivitamin with minerals  1 tablet Oral Daily   pantoprazole  80 mg Oral Daily   polyethylene glycol  17 g Oral Daily   sodium chloride flush  3 mL Intravenous Q12H   vitamin B-12  1,000 mcg Oral Daily   Continuous Infusions:  sodium chloride         LOS: 3 days    Time spent: 35 minutes    Ezekiel Slocumb, DO [facility]  06/04/2021, 5:14 PM      If 7PM-7AM, please contact night-coverage. How to contact the [facility] Attending or Consulting provider [place] or covering provider during after hours [place], for this patient?    Check the care team in [facility] and look for a) attending/consulting [facility] provider listed and b) the [facility] team listed Log into [email or web address] and use 's universal password to access. If you do not have the password,  please contact the hospital operator. Locate the [facility] provider you are looking for under [facility] and page to a number that you can be directly reached. If you still have difficulty reaching the provider, please page the [facility] (Director on Call) for the Hospitalists listed on amion for assistance.

## 2021-06-04 NOTE — Plan of Care (Signed)

## 2021-06-05 ENCOUNTER — Inpatient Hospital Stay (HOSPITAL_COMMUNITY): Payer: Medicaid Other

## 2021-06-05 DIAGNOSIS — R7881 Bacteremia: Secondary | ICD-10-CM

## 2021-06-05 DIAGNOSIS — J9601 Acute respiratory failure with hypoxia: Secondary | ICD-10-CM

## 2021-06-05 DIAGNOSIS — J9690 Respiratory failure, unspecified, unspecified whether with hypoxia or hypercapnia: Secondary | ICD-10-CM

## 2021-06-05 DIAGNOSIS — E8770 Fluid overload, unspecified: Secondary | ICD-10-CM

## 2021-06-05 DIAGNOSIS — I509 Heart failure, unspecified: Secondary | ICD-10-CM

## 2021-06-05 DIAGNOSIS — E8779 Other fluid overload: Secondary | ICD-10-CM

## 2021-06-05 DIAGNOSIS — B9561 Methicillin susceptible Staphylococcus aureus infection as the cause of diseases classified elsewhere: Secondary | ICD-10-CM

## 2021-06-05 HISTORY — PX: IR THORACENTESIS ASP PLEURAL SPACE W/IMG GUIDE: IMG5380

## 2021-06-05 LAB — GLUCOSE, CAPILLARY
Glucose-Capillary: 95 mg/dL (ref 70–99)
Glucose-Capillary: 112 mg/dL — ABNORMAL HIGH (ref 70–99)
Glucose-Capillary: 196 mg/dL — ABNORMAL HIGH (ref 70–99)
Glucose-Capillary: 124 mg/dL — ABNORMAL HIGH (ref 70–99)
Glucose-Capillary: 165 mg/dL — ABNORMAL HIGH (ref 70–99)

## 2021-06-05 LAB — BASIC METABOLIC PANEL
Anion gap: 10 (ref 5–15)
BUN: 36 mg/dL — ABNORMAL HIGH (ref 8–23)
CO2: 29 mmol/L (ref 22–32)
Calcium: 8.4 mg/dL — ABNORMAL LOW (ref 8.9–10.3)
Chloride: 96 mmol/L — ABNORMAL LOW (ref 98–111)
Creatinine, Ser: 3.97 mg/dL — ABNORMAL HIGH (ref 0.44–1.00)
GFR, Estimated: 12 mL/min — ABNORMAL LOW (ref >60)
Glucose, Bld: 150 mg/dL — ABNORMAL HIGH (ref 70–99)
Potassium: 2.9 mmol/L — ABNORMAL LOW (ref 3.5–5.1)
Sodium: 135 mmol/L (ref 135–145)

## 2021-06-05 LAB — PROTEIN / CREATININE RATIO, URINE
Creatinine, Urine: 52.46 mg/dL
Total Protein, Urine: <6 mg/dL

## 2021-06-05 LAB — BODY FLUID CELL COUNT WITH DIFFERENTIAL
Eos, Fluid: 0 %
Lymphs, Fluid: 20 %
Monocyte-Macrophage-Serous Fluid: 70 % (ref 50–90)
Neutrophil Count, Fluid: 10 % (ref 0–25)
Total Nucleated Cell Count, Fluid: 3085 cu mm — ABNORMAL HIGH (ref 0–1000)

## 2021-06-05 LAB — LACTATE DEHYDROGENASE, PLEURAL OR PERITONEAL FLUID: LD, Fluid: 90 U/L — ABNORMAL HIGH (ref 3–23)

## 2021-06-05 LAB — ECHOCARDIOGRAM LIMITED: Weight: 1629.64 oz

## 2021-06-05 MED ORDER — POTASSIUM CHLORIDE CRYS ER 20 MEQ PO TBCR
40.0000 meq | EXTENDED_RELEASE_TABLET | Freq: Once | ORAL | Status: AC
  Administered 2021-06-05: 40 meq via ORAL
  Filled 2021-06-05: qty 2

## 2021-06-05 MED ORDER — ENSURE ENLIVE PO LIQD
237.0000 mL | Freq: Three times a day (TID) | ORAL | Status: DC
  Administered 2021-06-05 – 2021-06-11 (×9): 237 mL via ORAL

## 2021-06-05 MED ORDER — ENSURE ENLIVE PO LIQD
237.0000 mL | Freq: Two times a day (BID) | ORAL | Status: DC
  Administered 2021-06-05: 237 mL via ORAL

## 2021-06-05 MED ORDER — LIDOCAINE HCL 1 % IJ SOLN
INTRAMUSCULAR | Status: DC | PRN
  Administered 2021-06-05: 10 mL

## 2021-06-05 MED ORDER — LIDOCAINE HCL (PF) 1 % IJ SOLN
INTRAMUSCULAR | Status: AC
  Filled 2021-06-05: qty 30

## 2021-06-05 NOTE — Progress Notes (Signed)
Initial Nutrition Assessment  DOCUMENTATION CODES:   Underweight, Severe malnutrition in context of chronic illness  INTERVENTION:   - Increase Ensure Enlive po to TID, each supplement provides 350 kcal and 20 grams of protein (strawberry flavor)  - "High Calorie, High Protein Nutrition Therapy" handout from [employer] and diet education provided  - Continue MVI with minerals daily  - Encourage PO intake  - If PO intake does not improve, recommend considering Cortrak NG tube placement and initiation of enteral nutrition given severity of malnutrition  NUTRITION DIAGNOSIS:   Severe Malnutrition related to chronic illness (CHF, CKD stage IV) as evidenced by moderate fat depletion, severe muscle depletion, percent weight loss (15.8% weight loss in less than 4 months).  GOAL:   Patient will meet greater than or equal to 90% of their needs  MONITOR:   PO intake, Supplement acceptance, Labs, Weight trends, I & O's  REASON FOR ASSESSMENT:   Consult Assessment of nutrition requirement/status  ASSESSMENT:   60 year old female who presented to the ED on 6/13 with AMS and hypoglycemia. PMH of T2DM, CKD stage IV, HTN, hypothyroidism, CHF, adrenal insufficiency. Pt with recent hospitalization for severe constipation. Pt found to have pulmonary edema and bilateral pleural effusions, AKI, and MSSA bacteremia.  617 - s/p R thoracentesis with 400 ml fluid removed  Spoke with RN prior to entering pt's room. RN reports pt is eating a little better today and has consumed some Ensure.  Spoke with pt and pt's mother at bedside. Pt's mother reports that even before pt got sick more recently, pt had a poor appetite. Pt's mother shares that pt only eats small portions at meal times. She reports that this has been going on "for a long time." Pt has family members that encourage her to eat.  Pt believes that she has lost weight but is unsure of UBW or how much weight she has  lost. Pt reports that her "heart doctor" wants her weight to stay "in the 100's..." but is insure exact amount.  Reviewed weight history in chart. Pt with weight loss of 8.7 kg since 02/13/21. This is a 15.8% weight loss in less than 4 months which is severe and significant for timeframe.  Pt's mother states that pt ate some mashed potatoes and chicken noodle soup for lunch today. Pt with Ensure and juice at bedside. Pt's mother reports that she is going to continually remind pt to drink the fluids and that she will make sure pt finishes the Ensure.  RD offered to order Magic Cups with meals as pt really likes ice cream but unable do order via [facility] due to pork and beef allergies in place. Per review of chart, it looks like these are religious preferences. Unable to reach pt again to ask if Magic Cups would be okay as they contain gelatin. RD will order ice cream TID with meals.  RD encouraged pt to consume food orally and discussed Cortrak NG tube as an option if pt unable to meet her nutritional needs via PO route. Pt's mother would like to avoid this if possible as the hopes pt will go home. RD provided pt's mother with 3 copies "High Calorie, High Protein Nutrition Therapy" handout from the Academy of [employer]. Discussed handout with pt and mother.  Meal Completion: 20-50%  Medications reviewed and include: oscal with D, IV lasix, lactulose, MVI with minerals, protonix, miralax, vitamin B-12, IV abx  Labs reviewed: potassium 2.9, BUN 36, creatinine 3.97,  hemoglobin 7.9, HCT 24.5 CBG's: 95-196 x 24 hours  UOP: 1125 ml x 24 hours I/O's: -4.7 L since admit  NUTRITION - FOCUSED PHYSICAL EXAM:  Flowsheet Row Most Recent Value  Orbital Region Moderate depletion  Upper Arm Region Moderate depletion  Thoracic and Lumbar Region Moderate depletion  Buccal Region Moderate depletion  Temple Region Moderate depletion  Clavicle Bone Region Severe depletion  Clavicle and  Acromion Bone Region Severe depletion  Scapular Bone Region Severe depletion  Dorsal Hand Severe depletion  Patellar Region Severe depletion  Anterior Thigh Region Severe depletion  Posterior Calf Region Severe depletion  Edema (RD Assessment) None  Hair Reviewed  Eyes Reviewed  Mouth Reviewed  Skin Reviewed  Nails Reviewed       Diet Order:   Diet Order             Diet Carb Modified Fluid consistency: Thin; Room service appropriate? Yes; Fluid restriction: 1500 mL Fluid  Diet effective now                   EDUCATION NEEDS:   Education needs have been addressed  Skin:  Skin Assessment: Reviewed RN Assessment  Last BM:  06/05/21 medium type 7  Height:   Ht Readings from Last 1 Encounters:  05/27/21 '5\' 4"'$  (1.626 m)    Weight:   Wt Readings from Last 1 Encounters:  06/05/21 46.2 kg    BMI:  Body mass index is 17.48 kg/m.  Estimated Nutritional Needs:   Kcal:  1650-1850  Protein:  80-90 grams  Fluid:  1.6-1.8 L    Gustavus Bryant, MS, RD, LDN Inpatient Clinical Dietitian Please see [facility] for contact information.

## 2021-06-05 NOTE — Progress Notes (Addendum)
[facility] Progress Note   Assessment/ Plan:    AKI on CKD IV: in the setting of hypoglyemic episode and CHF exacerbation on the background of RAS.  She will be very sensitive to even the smallest hypotensive episode in the setting of bilateral RAS.  Renal US without obstruction 06/01/21.   Plan: - priority will be fluid removal - stopped hydralazine - continue Lasix 80 mg IV BID--> hoping to transition to PO in a day or two - previously intervention not pursued for RAS- consider, would be more of a long-term plan - ordered UA (negative) and UP/C- pending - no indication for dialysis at present but will closely monitor - Cr improving and diuresis is going well, made 2.5L--> continue current - d/c Foley   2.  dCHF exacerbation: metoprolol, imdur, stopped hydralazine as above as well as increased Lasix   3.  Hypoglycemia: off all oral hypoglycemics, CBGs have been adequate.  Q 2 CBG per primary  4.  Acute hypoxic RF: presumably to pulm edema- increased diuresis as above  5.  History of adrenal insufficiency: s/p cosyntropin test per primary  6.  Possible HE: on lactulose   7.  MSSA bacteremia- had fever, blood cultures drawn on 6/15. Started on Ancef 6/15-    8.  Dispo: pending  Subjective:    Blood cultures + for MSSA, started on ancef.  Awake and alert this AM.  No issues.     Objective:   BP (!) 125/44 (BP Location: Left Arm)   Pulse 65   Temp 98.6 F (37 C) (Oral)   Resp 15   Wt 46.2 kg   SpO2 98%   BMI 17.48 kg/m   Physical Exam: GEN: sitting in bed HEENT EOMI PERRL NECK +JVD much improved PULM bilateral crackles faint bibasilar CV RRR ABD soft, nontender EXT no LE edema  Labs: BMET Recent Labs  Lab 06/01/21 0145 06/02/21 0103 06/03/21 0204 06/04/21 0021 06/05/21 0113  NA 131* 129* 132* 133* 135  K 4.3 4.2 3.8 3.5 2.9*  CL 100 98 96* 97* 96*  CO2 21* '22 25 26 29  '$ GLUCOSE 54* 100* 107* 112* 150*  BUN 43* 44* 43* 41* 36*   CREATININE 4.06* 4.33* 4.54* 4.18* 3.97*  CALCIUM 8.8* 8.7* 8.8* 8.5* 8.4*   CBC Recent Labs  Lab 06/01/21 0324 06/03/21 0204 06/04/21 0021  WBC 7.1 4.5 7.1  NEUTROABS 4.8  --  5.3  HGB 7.6* 7.3* 7.9*  HCT 25.0* 22.9* 24.5*  MCV 99.2 93.5 92.5  PLT 185 170 172      Medications:     amLODipine  10 mg Oral Daily   aspirin  81 mg Oral Daily   atorvastatin  20 mg Oral Daily   calcium-vitamin D  2 tablet Oral Q breakfast   Chlorhexidine Gluconate Cloth  6 each Topical Daily   dextrose  1 ampule Intravenous Once   fluticasone  2 spray Each Nare Daily   furosemide  80 mg Intravenous BID   heparin  5,000 Units Subcutaneous Q8H   isosorbide mononitrate  60 mg Oral Daily   lactulose  20 g Oral TID   levothyroxine  150 mcg Oral Q0600   metoprolol succinate  25 mg Oral Daily   multivitamin with minerals  1 tablet Oral Daily   pantoprazole  80 mg Oral Daily   polyethylene glycol  17 g Oral Daily   sodium chloride flush  3 mL Intravenous Q12H   vitamin B-12  1,000  mcg Oral Daily     Madelon Lips MD 06/05/2021, 9:24 AM

## 2021-06-05 NOTE — Procedures (Signed)
PROCEDURE SUMMARY:  Small effusion on left, not amenable to thoracentesis. Moderate effusion on right.  Successful US guided right thoracentesis. Yielded 400 mL of clear yellow fluid. Pt tolerated procedure well. No immediate complications.  Specimen was sent for labs. CXR ordered.  EBL < 5 mL  Ascencion Dike PA-C 06/05/2021 10:29 AM

## 2021-06-05 NOTE — Progress Notes (Signed)
  Echocardiogram 2D Echocardiogram has been performed.  Merrie Roof F 06/05/2021, 1:41 PM

## 2021-06-05 NOTE — Progress Notes (Addendum)
PROGRESS NOTE    [name]   [id number]  DOB: [birth year]  PCP: Ladell Pier, MD    DOA: 06/01/2021 LOS: 4   Assessment & Plan   Principal Problem:   Bacteremia due to methicillin susceptible Staphylococcus aureus (MSSA) Active Problems:   CKD (chronic kidney disease) stage 4, GFR 15-29 ml/min (HCC)   HTN (hypertension)   Anemia due to chronic kidney disease   Adrenal insufficiency (Addison's disease) (HCC)   AKI (acute kidney injury) ([place])   Acute on chronic diastolic CHF (congestive heart failure) (HCC)   DM2 (diabetes mellitus, type 2) (HCC)   CHF exacerbation (HCC)   Volume overload   Respiratory failure (HCC)   Acute respiratory failure with hypoxia due to pulmonary edema and bilateral pleural effusions -  Hypoxic in ED and started on 2L >> 6L, not on home O2. Further increased oxygen needs, on 10 L/min HFNC. ABG normal pCO2 and pH, hypoxemic w/ pO2 54. CXR with persistent bilateral pleural effusions and improving but not resolved b/l infiltrates. --Supplement O2, target sat > 90%, wean as tolerated --Treat underlying conditions as below (renal failure, CHF, ?PNA given new fever this afternoon) 6/15: procal 0/24, elevated D-dimer 6/16: V/Q scan negative for PE, consistent with CHF; afebrile today --follow blood cultures --US-guided thoracentesis 6/17 AM - 400 mL fluid removed --Follow up pleural fluid studies --ongoing diuresis as below  Fevers of unknown cause, MSSA in 1 of 4 bottles Blood Culture -  --ID consulted --Started on Ancef --see ID recs No clear source or symptoms of infection.  Right Upper Extremity Edema - RUE venous doppler U/S pending.   AKI superimposed on CKD stage 4 -  Baseline Cr appears ~2.5.  Presented with Cr 4.0 -> 4.3. Renal U/S negative for hydronephrosis / obstruction. --Nephrology following, appreciate input --Foley for I/O's per prior attending, d/c asap   Acute on chronic diastolic CHF - with hypoxia due to  pleural effusions and pulmonary edema, elevated BNP.   Echo 6/15: EF 55-60%, grade II diastolic dysfunction, biatrial dilation On IV Lasix per nephrology. Net IO Since Admission: -4,775 mL [06/05/21 1543] --Diuretics per nephrology --Strict I/O's and daily weights --Follow up pending Echo --Continue metoprolol, Imdur --Hydralazine stopped --On IV Lasix 80 mg BID - good UO continues  Acute metabolic encephalopathy - likely multifactorial due to hypoglycemia, hyperammonemia, hypoxia.   --Treat underlying causes as above/below --Continue lactulose --Delirium precautions:     -Lights and TV off, minimize interruptions at night    -Blinds open and lights on during day    -Glasses/hearing aid with patient    -Frequent reorientation    -PT/OT when able    -Avoid sedation medications/[place] list medications   Hypoglycemia / Type 2 diabetes - improved. Most likely 2/2 Prandin in the setting of worsening renal failure vs. adrenal insufficiency.  Given D50 in the ED. CBG's in low 100's, some 70-80's --Monitor closely --Hold antihyperglycemic agents --Hypoglycemia protocol --Resume CBG's routine AC/HS as pt alert and eating well now  Hyponatremia - suspect multifactorial due to volume overload and ?adrenal insufficiency.  IMPROVED.  Na 129 >>132>>133. TSH normal. --AI under evaluation as below --Follow BMP daily  Hypokalemia - replacing with PO for K 2.9. --Folllow BMP and replace as needed  Hyperammonemia ? Hepatic encephalopathy --Continue lactulose --Titrate lactulose dosing for 3+ soft BM's daily CT a/p on 05/12/21 with no liver or biliary abnormalities seen.   Possible history of adrenal insufficiency Pt follows with Dr. Loanne Drilling.   Previously on  Solu-cortef, appears was d/c'd at last visit. Cortisol level on admission is 2.5 Prior attending discussed with Dr. Dwyane Dee, on call for endocrinology, recommended Cosyntropin stim test. This was not properly collected and was re-ordered  for this AM. --ACTH levels and stim test pending - follow --Hold off starting steroid for now, concern for ?infection given fever today (6/15)   Hypothyroidism chronic, controlled. TSH normal, FT4 normal. --Continue levothyroxine   Anemia of chronic disease - baseline Hbg 7.8.  No active bleeding. --Follow CBC daily   HTN - chronic, stable.  Due to Bilateral RAS. --Continue metoprolol, amlodipine --Renal stopped hydralazine  --Currently on IV Lasix 80 mg BID --Consider intervention for RAS     Patient BMI: Body mass index is 17.48 kg/m.   DVT prophylaxis: heparin injection 5,000 Units Start: 06/01/21 0600   Diet:  Diet Orders (From admission, onward)     Start     Ordered   06/03/21 0947  Diet Carb Modified Fluid consistency: Thin; Room service appropriate? Yes; Fluid restriction: 1500 mL Fluid  Diet effective now       Question Answer Comment  Diet-HS Snack? Nothing   Calorie Level Medium 1600-2000   Fluid consistency: Thin   Room service appropriate? Yes   Fluid restriction: 1500 mL Fluid      06/03/21 0946              Code Status: Full Code   Brief Narrative / Hospital Course to Date:   60 y/o female with history of CKD stage 4, DM, bilateral RAS admitted with AMS related to hypoglycemia. She was noted to be short of breath and imaging showed bilateral pleural effusions, had worsening renal function.  Admitted to [facility] with nephrology and pulmonology consulted.    Undergoing diuresis with IV Lasix.  z Hypoglycemia has improved.   Patient remains encephalopathic.   Lactulose started due to hyperammonemia potentially contributing to mental status changes.  Transferred to [facility] on 6/14 due to increased oxygen requirement and work of breathing.  Remains on 10 L/min HFNC oxygen.  Pulmonology following.      Subjective 06/05/21    Pt awake today, first time I've seen her awake and alert.  Says feeling much better today.  Felt hot and cold  last night (had temp 102.5 F around 11pm).  Denies cough, congestion, headache, sore throat, N/V/D, dysuria or other acute complaints.     Disposition Plan & Communication   Status is: Inpatient  Remains inpatient appropriate because:IV treatments appropriate due to intensity of illness or inability to take PO  Dispo: The patient is from: Home              Anticipated d/c is to: Home              Patient currently is not medically stable to d/c.   Difficult to place patient No   Family Communication: Mother was at bedside on round today 6/17   Consults, Procedures, Significant Events   Consultants:  Nephrology PCCM  Procedures:     Antimicrobials:  Anti-infectives (From admission, onward)    Start     Dose/Rate Route Frequency Ordered Stop   06/04/21 2030  ceFAZolin (ANCEF) IVPB 2g/100 mL premix        2 g 200 mL/hr over 30 Minutes Intravenous Every 12 hours 06/04/21 1940           Micro    Objective   Vitals:   06/05/21 0400 06/05/21 0700 06/05/21  0900 06/05/21 1137  BP: (!) 126/42 (!) 125/44 (!) 141/97 (!) 104/51  Pulse: 67 65  63  Resp: '14 15  16  '$ Temp: 99 F (37.2 C) 98.6 F (37 C)  98.7 F (37.1 C)  TempSrc: Oral Oral  Oral  SpO2: 92% 98%  97%  Weight: 46.2 kg       Intake/Output Summary (Last 24 hours) at 06/05/2021 1543 Last data filed at 06/05/2021 1200 Gross per 24 hour  Intake 340 ml  Output 2025 ml  Net -1685 ml   Filed Weights   06/03/21 0418 06/05/21 0400  Weight: 49.8 kg 46.2 kg    Physical Exam:  General exam: awake, alert, no acute distress Respiratory system: CTAB, no wheezes, normal respiratory effort, on 4 L/min supplemental oxygen. Cardiovascular system: normal S1/S2, RRR, no pedal edema.   Gastrointestinal system: soft, NT, ND, +bowel sounds Central nervous system: A&Ox3, normal speech, grossly non-focal exam Extremities: moves all, normal tone Psychiatry: normal mood, congruent affect, judgment and insight appear  intact  Labs   Data Reviewed: I have personally reviewed following labs and imaging studies  CBC: Recent Labs  Lab 06/01/21 0324 06/03/21 0204 06/04/21 0021  WBC 7.1 4.5 7.1  NEUTROABS 4.8  --  5.3  HGB 7.6* 7.3* 7.9*  HCT 25.0* 22.9* 24.5*  MCV 99.2 93.5 92.5  PLT 185 170 [id number]   Basic Metabolic Panel: Recent Labs  Lab 06/01/21 0145 06/02/21 0103 06/03/21 0204 06/04/21 0021 06/05/21 0113  NA 131* 129* 132* 133* 135  K 4.3 4.2 3.8 3.5 2.9*  CL 100 98 96* 97* 96*  CO2 21* '22 25 26 29  '$ GLUCOSE 54* 100* 107* 112* 150*  BUN 43* 44* 43* 41* 36*  CREATININE 4.06* 4.33* 4.54* 4.18* 3.97*  CALCIUM 8.8* 8.7* 8.8* 8.5* 8.4*   GFR: Estimated Creatinine Clearance: 10.9 mL/min (A) (by C-G formula based on SCr of 3.97 mg/dL (H)). Liver Function Tests: Recent Labs  Lab 06/01/21 0145 06/04/21 1044  AST 27 51*  ALT 12 17  ALKPHOS 127* 88  BILITOT 0.9 1.4*  PROT 7.0 6.3*  ALBUMIN 3.4* 3.0*   No results for input(s): LIPASE, AMYLASE in the last 168 hours. Recent Labs  Lab 06/02/21 0957 06/03/21 0204 06/04/21 0021  AMMONIA 82* 19 44*   Coagulation Profile: Recent Labs  Lab 06/04/21 1044  INR 1.2   Cardiac Enzymes: No results for input(s): CKTOTAL, CKMB, CKMBINDEX, TROPONINI in the last 168 hours. BNP (last 3 results) No results for input(s): PROBNP in the last 8760 hours. HbA1C: No results for input(s): HGBA1C in the last 72 hours. CBG: Recent Labs  Lab 06/04/21 2010 06/04/21 2319 06/05/21 0447 06/05/21 0712 06/05/21 1135  GLUCAP 123* 117* 95 112* 196*   Lipid Profile: No results for input(s): CHOL, HDL, LDLCALC, TRIG, CHOLHDL, LDLDIRECT in the last 72 hours. Thyroid Function Tests: No results for input(s): TSH, T4TOTAL, FREET4, T3FREE, THYROIDAB in the last 72 hours.  Anemia Panel: No results for input(s): VITAMINB12, FOLATE, FERRITIN, TIBC, IRON, RETICCTPCT in the last 72 hours. Sepsis Labs: Recent Labs  Lab 06/01/21 0145 06/03/21 1549  06/03/21 1846  PROCALCITON  --  0.24  --   LATICACIDVEN 1.1 0.9 0.7    Recent Results (from the past 240 hour(s))  SARS CORONAVIRUS 2 (TAT 6-24 HRS) Nasopharyngeal Nasopharyngeal Swab     Status: None   Collection Time: 06/01/21  1:09 AM   Specimen: Nasopharyngeal Swab  Result Value Ref Range Status   SARS Coronavirus 2  NEGATIVE NEGATIVE Final    Comment: (NOTE) SARS-CoV-2 target nucleic acids are NOT DETECTED.  The SARS-CoV-2 RNA is generally detectable in upper and lower respiratory specimens during the acute phase of infection. Negative results do not preclude SARS-CoV-2 infection, do not rule out co-infections with other pathogens, and should not be used as the sole basis for treatment or other patient management decisions. Negative results must be combined with clinical observations, patient history, and epidemiological information. The expected result is Negative.  Fact Sheet for Patients: SugarRoll.be  Fact Sheet for Healthcare Providers: https://www.woods-mathews.com/  This test is not yet approved or cleared by the [place] FDA and  has been authorized for detection and/or diagnosis of SARS-CoV-2 by FDA under an Emergency Use Authorization (EUA). This EUA will remain  in effect (meaning this test can be used) for the duration of the COVID-19 declaration under Se ction 564(b)(1) of the Act, 21 U.S.C. section 360bbb-3(b)(1), unless the authorization is terminated or revoked sooner.  Performed at [facility], [place] [address], [place], [place] [address]   Culture, blood (routine x 2)     Status: Abnormal (Preliminary result)   Collection Time: 06/03/21  3:49 PM   Specimen: BLOOD  Result Value Ref Range Status   Specimen Description BLOOD LEFT ANTECUBITAL  Final   Special Requests   Final    BOTTLES DRAWN AEROBIC AND ANAEROBIC Blood Culture adequate volume   Culture  Setup Time   Final    GRAM POSITIVE COCCI IN  CLUSTERS ANAEROBIC BOTTLE ONLY CRITICAL RESULT CALLED TO, READ BACK BY AND VERIFIED WITHDion Body PHARMD 1911 06/04/21 A BROWNING    Culture (A)  Final    STAPHYLOCOCCUS AUREUS SUSCEPTIBILITIES TO FOLLOW Performed at [facility], [place] [address], [place], [place] [address]    Report Status PENDING  Incomplete  Blood Culture ID Panel (Reflexed)     Status: Abnormal   Collection Time: 06/03/21  3:49 PM  Result Value Ref Range Status   Enterococcus faecalis NOT DETECTED NOT DETECTED Final   Enterococcus Faecium NOT DETECTED NOT DETECTED Final   Listeria monocytogenes NOT DETECTED NOT DETECTED Final   Staphylococcus species DETECTED (A) NOT DETECTED Final    Comment: CRITICAL RESULT CALLED TO, READ BACK BY AND VERIFIED WITHDion Body PHARMD 1911 06/04/21 A BROWNING    Staphylococcus aureus (BCID) DETECTED (A) NOT DETECTED Final    Comment: CRITICAL RESULT CALLED TO, READ BACK BY AND VERIFIED WITHDion Body PHARMD 1911 06/04/21 A BROWNING    Staphylococcus epidermidis NOT DETECTED NOT DETECTED Final   Staphylococcus lugdunensis NOT DETECTED NOT DETECTED Final   Streptococcus species NOT DETECTED NOT DETECTED Final   Streptococcus agalactiae NOT DETECTED NOT DETECTED Final   Streptococcus pneumoniae NOT DETECTED NOT DETECTED Final   Streptococcus pyogenes NOT DETECTED NOT DETECTED Final   A.calcoaceticus-baumannii NOT DETECTED NOT DETECTED Final   Bacteroides fragilis NOT DETECTED NOT DETECTED Final   Enterobacterales NOT DETECTED NOT DETECTED Final   Enterobacter cloacae complex NOT DETECTED NOT DETECTED Final   Escherichia coli NOT DETECTED NOT DETECTED Final   Klebsiella aerogenes NOT DETECTED NOT DETECTED Final   Klebsiella oxytoca NOT DETECTED NOT DETECTED Final   Klebsiella pneumoniae NOT DETECTED NOT DETECTED Final   Proteus species NOT DETECTED NOT DETECTED Final   Salmonella species NOT DETECTED NOT DETECTED Final   Serratia marcescens NOT DETECTED NOT DETECTED Final    Haemophilus influenzae NOT DETECTED NOT DETECTED Final   Neisseria meningitidis NOT  DETECTED NOT DETECTED Final   Pseudomonas aeruginosa NOT DETECTED NOT DETECTED Final   Stenotrophomonas maltophilia NOT DETECTED NOT DETECTED Final   Candida albicans NOT DETECTED NOT DETECTED Final   Candida auris NOT DETECTED NOT DETECTED Final   Candida glabrata NOT DETECTED NOT DETECTED Final   Candida krusei NOT DETECTED NOT DETECTED Final   Candida parapsilosis NOT DETECTED NOT DETECTED Final   Candida tropicalis NOT DETECTED NOT DETECTED Final   Cryptococcus neoformans/gattii NOT DETECTED NOT DETECTED Final   Meth resistant mecA/C and MREJ NOT DETECTED NOT DETECTED Final    Comment: Performed at [facility], [place] [address], [place], [place] [address]  Culture, blood (routine x 2)     Status: None (Preliminary result)   Collection Time: 06/03/21  4:04 PM   Specimen: BLOOD  Result Value Ref Range Status   Specimen Description BLOOD LEFT ANTECUBITAL  Final   Special Requests   Final    BOTTLES DRAWN AEROBIC AND ANAEROBIC Blood Culture adequate volume   Culture   Final    NO GROWTH 2 DAYS Performed at [facility], [place] [address], [place], [place] [address]    Report Status PENDING  Incomplete      Imaging Studies   DG Chest 1 View  Result Date: 06/05/2021 CLINICAL DATA:  Post right thoracentesis with 400 mL removed. EXAM: CHEST  1 VIEW COMPARISON:  June 04, 2021. FINDINGS: Decreased right pleural effusion without visible residual effusion on the right. Small left pleural effusion. Overlying mild left basilar opacity. No visible pneumothorax. Similar enlarged cardiac silhouette. Central pulmonary vascular congestion. IMPRESSION: 1. Decreased right pleural effusion without visible right pleural effusion or visible pneumothorax. 2. Small left pleural effusion. Overlying mild left basilar opacity, most likely atelectasis. 3. Cardiomegaly and central pulmonary vascular congestion.  Electronically Signed   By: Margaretha Sheffield MD   On: 06/05/2021 10:58   NM Pulmonary Perfusion  Result Date: 06/04/2021 CLINICAL DATA:  Shortness of breath EXAM: NUCLEAR MEDICINE PERFUSION LUNG SCAN TECHNIQUE: Perfusion images were obtained in multiple projections after intravenous injection of radiopharmaceutical. Ventilation scans intentionally deferred if perfusion scan and chest x-ray adequate for interpretation during COVID 19 epidemic. RADIOPHARMACEUTICALS:  4.0 mCi Tc-62mMAA IV COMPARISON:  Chest x-ray from earlier in the same day FINDINGS: No S-shaped defects are noted on perfusion images to suggest pulmonary embolism. IMPRESSION: No evidence of pulmonary embolism. Electronically Signed   By: MInez CatalinaM.D.   On: 06/04/2021 14:36   DG CHEST PORT 1 VIEW  Result Date: 06/04/2021 CLINICAL DATA:  Shortness of breath. History of congestive heart failure. EXAM: PORTABLE CHEST 1 VIEW COMPARISON:  06/02/2021 FINDINGS: Cardiomegaly. Mediastinal shadows are normal. Bilateral effusions, left larger than right, with lower lobe atelectasis and or pneumonia. Mild edema in the lungs elsewhere. IMPRESSION: Findings consistent with congestive heart failure. Enlarged cardiac silhouette. Bilateral effusions left larger than right. Atelectatic changes in the lower lungs left more than right. Mild edema pattern. Electronically Signed   By: MNelson ChimesM.D.   On: 06/04/2021 11:33   IR THORACENTESIS ASP PLEURAL SPACE W/IMG GUIDE  Result Date: 06/05/2021 INDICATION: History of congestive heart failure, shortness of breath. Bilateral pleural effusions. Request for diagnostic and therapeutic thoracentesis. EXAM: ULTRASOUND GUIDED RIGHT THORACENTESIS MEDICATIONS: 1% plain lidocaine, 5 mL COMPLICATIONS: None immediate. PROCEDURE: An ultrasound guided thoracentesis was thoroughly discussed with the patient and questions answered. The benefits, risks, alternatives and complications were also discussed. The patient  understands and wishes to  proceed with the procedure. Written consent was obtained. Imaging of the left chest demonstrates small effusion, not amenable for safe percutaneous thoracentesis. Imaging of the right chest demonstrates moderate effusion safe for percutaneous access. Ultrasound was performed to localize and mark an adequate pocket of fluid in the right chest. The area was then prepped and draped in the normal sterile fashion. 1% Lidocaine was used for local anesthesia. Under ultrasound guidance a 6 Fr Safe-T-Centesis catheter was introduced. Thoracentesis was performed. The catheter was removed and a dressing applied. FINDINGS: A total of approximately 400 mL of clear yellow fluid was removed. Samples were sent to the laboratory as requested by the clinical team. IMPRESSION: Successful ultrasound guided right thoracentesis yielding 400 mL of pleural fluid. Read by: Ascencion Dike PA-C Electronically Signed   By: Markus Daft M.D.   On: 06/05/2021 10:59     Medications   Scheduled Meds:  amLODipine  10 mg Oral Daily   aspirin  81 mg Oral Daily   atorvastatin  20 mg Oral Daily   calcium-vitamin D  2 tablet Oral Q breakfast   Chlorhexidine Gluconate Cloth  6 each Topical Daily   dextrose  1 ampule Intravenous Once   feeding supplement  237 mL Oral BID BM   fluticasone  2 spray Each Nare Daily   furosemide  80 mg Intravenous BID   heparin  5,000 Units Subcutaneous Q8H   isosorbide mononitrate  60 mg Oral Daily   lactulose  20 g Oral TID   levothyroxine  150 mcg Oral Q0600   lidocaine (PF)       metoprolol succinate  25 mg Oral Daily   multivitamin with minerals  1 tablet Oral Daily   pantoprazole  80 mg Oral Daily   polyethylene glycol  17 g Oral Daily   sodium chloride flush  3 mL Intravenous Q12H   vitamin B-12  1,000 mcg Oral Daily   Continuous Infusions:  sodium chloride      ceFAZolin (ANCEF) IV 2 g (06/05/21 0908)       LOS: 4 days    Time spent: 35 minutes with > 50%  spent at bedside and in coordination of care.    Ezekiel Slocumb, DO [facility]  06/05/2021, 3:43 PM      If 7PM-7AM, please contact night-coverage. How to contact the [facility] Attending or Consulting provider [place] or covering provider during after hours [place], for this patient?    Check the care team in [facility] and look for a) attending/consulting [facility] provider listed and b) the [facility] team listed Log into [email or web address] and use [place]'s universal password to access. If you do not have the password, please contact the hospital operator. Locate the [facility] provider you are looking for under [facility] and page to a number that you can be directly reached. If you still have difficulty reaching the provider, please page the [facility] (Director on Call) for the Hospitalists listed on amion for assistance.

## 2021-06-05 NOTE — Consult Note (Signed)
[facility]    Date of Admission:  06/01/2021     Reason for Consult:  MSSA bacteremia     Referring Physician: Tivis Ringer auto consult  Current antibiotics: Cefazolin 6/16--present  ASSESSMENT:    MSSA bacteremia: BCx positive in 1 of 4 bottles from 6/15.  BCID with MSSA and started on cefazolin.  Initial cultures obtained due to patient spiking fever on hospital day #3 and no clear source of bacteremia although suspicious that it may be related to her old PIV that was inserted by EMS and has now been removed.  TTE 6/15 without obvious vegetations but was a challenging study.  Otherwise she has no central lines in place, no prosthetic heart valves/implanted cardiac devices or prosthetic joints.  Her urethral catheter was placed 6/14 and UA without evidence of UTI.  CXR shows bilateral effusions c/w CHF.  Planning for thoracentesis today.  Acute on chronic kidney disease (CKD4): baseline creatine appears to be ~2.5 on the background of RAS.  Admitted with creatinine 4.0 and today is 3.9.  Nephrology following and renal US without hydronephrosis. HFpEF exacerbation Acute hypoxic respiratory failure: presumably due to pulmonary edema.  Diuresing and thoracentesis planned today Hypoglycemia in the setting of diabetes Encephalopathy, likely multifactorial History of adrenal insufficiency  PLAN:    Continue cefazolin dosed for renal function Ultrasound right wrist at site of previous PIV Repeat blood cultures tomorrow Will repeat TTE and hopefully will be able to obtain better windows due to difficult study earlier in her admission as I suspect she will be more cooperative now Agree with removing Foley if no longer needed Follow up thoracentesis studies Will follow.  Dr West Bali is available as needed over the weekend   Principal Problem:   Bacteremia due to methicillin susceptible Staphylococcus aureus (MSSA) Active Problems:   CKD (chronic kidney disease)  stage 4, GFR 15-29 ml/min (HCC)   HTN (hypertension)   Anemia due to chronic kidney disease   Adrenal insufficiency (Addison's disease) (HCC)   AKI (acute kidney injury) ([place])   Acute on chronic diastolic CHF (congestive heart failure) (HCC)   DM2 (diabetes mellitus, type 2) (HCC)   CHF exacerbation (HCC)   Volume overload   Respiratory failure (HCC)   MEDICATIONS:    Scheduled Meds:  amLODipine  10 mg Oral Daily   aspirin  81 mg Oral Daily   atorvastatin  20 mg Oral Daily   calcium-vitamin D  2 tablet Oral Q breakfast   Chlorhexidine Gluconate Cloth  6 each Topical Daily   dextrose  1 ampule Intravenous Once   fluticasone  2 spray Each Nare Daily   furosemide  80 mg Intravenous BID   heparin  5,000 Units Subcutaneous Q8H   isosorbide mononitrate  60 mg Oral Daily   lactulose  20 g Oral TID   levothyroxine  150 mcg Oral Q0600   metoprolol succinate  25 mg Oral Daily   multivitamin with minerals  1 tablet Oral Daily   pantoprazole  80 mg Oral Daily   polyethylene glycol  17 g Oral Daily   sodium chloride flush  3 mL Intravenous Q12H   vitamin B-12  1,000 mcg Oral Daily   Continuous Infusions:  sodium chloride      ceFAZolin (ANCEF) IV 2 g (06/05/21 0908)   PRN Meds:.sodium chloride, acetaminophen **OR** acetaminophen (TYLENOL) oral liquid 160 mg/5 mL, lidocaine, ondansetron **OR** ondansetron (ZOFRAN) IV, sodium chloride flush, traMADol  HPI:    [employer]  is a 60 y.o. female with a history of CKD stage IV, bilateral RAS, hypertension, HFpEF, diabetes, previous adrenal insufficiency, chronic anemia who presented from home on 6/13 with encephalopathy found to be hypoglycemic with worsening renal function, hypoxia, and volume overload from acute on chronic diastolic heart failure.  She had reported progressive dyspnea with exertion and fatigue over the days prior to admission with dry cough.  She reported that her son had been monitoring her oxygen saturations and this was  found to be low.  She reported low-grade temperatures, however, other documentation reports denial of fevers, chills, abdominal pain, or chest pain.  She was recently hospitalized for severe constipation 5/24 through 5/28 with CT findings consistent with fecal impaction and stercoral colitis.  There was no perforation or abscess.  Upon admission here she was found to have low blood sugars, hyponatremia, worsening creatinine, elevated BNP, normal WBC, normal lactate.  Chest x-ray was consistent with pulmonary edema and bilateral pleural effusions.  She was started on diuresis and nephrology was consulted.  Her urinalysis was unremarkable for any infectious etiology and a Foley catheter was placed to monitor her I's and O's.  She had a peripheral IV that was inserted prior to admission by EMS which became painful.  This was subsequently removed and a new PIV was placed.  She was noted to have worsening oxygen requirements requiring high flow nasal cannula thus moved to the stepdown unit and critical care was consulted.  On hospital day 3 she spiked a fever of 103.1 F.  Peripheral blood cultures were obtained with 1 out of 4 bottles being positive for MSSA.  She was started on cefazolin dosed for renal function yesterday.  We have been consulted for further recommendations as a result.   Past Medical History:  Diagnosis Date   Adrenal insufficiency, primary ([place])    Anemia 2018   Hgb 6.8 early 11/2020.  PRBC x 1.  06/2017 PRBC x 1.     CHF (congestive heart failure) ([place]) 99991111   Diastolic   CKD (chronic kidney disease) stage 4, GFR 15-29 ml/min ([place]) 04/2021   Stage 4 as of late May 2022   Diabetes mellitus without complication ([place])    Hypertension    Hypothyroidism     Social History   Tobacco Use   Smoking status: Never   Smokeless tobacco: Never  Substance Use Topics   Alcohol use: No   Drug use: No    Family History  Problem Relation Age of Onset   Diabetes Mother    Hyperlipidemia  Father    Hypertension Father    Adrenal disorder Neg Hx     Allergies  Allergen Reactions   Beef-Derived Products     RELIGOUS  REASONS   Pork-Derived Products     RELIGIOUS REASONS   Percocet [Oxycodone-Acetaminophen] Rash    Review of Systems  Constitutional:  Positive for fever.  HENT: Negative.    Eyes: Negative.   Respiratory:  Positive for cough and shortness of breath. Negative for sputum production.   Cardiovascular:  Negative for chest pain and leg swelling.  Gastrointestinal:  Negative for abdominal pain, nausea and vomiting.  Genitourinary: Negative.   Musculoskeletal:  Positive for joint pain. Negative for back pain and neck pain.  Skin:  Negative for rash.  Neurological:  Positive for headaches.   OBJECTIVE:   Blood pressure (!) 141/97, pulse 65, temperature 98.6 F (37 C), temperature source Oral, resp. rate 15, weight 46.2 kg, SpO2 98 %.  Body mass index is 17.48 kg/m.  Physical Exam Constitutional:      Comments: Thin, ill appearing woman, lying in bed.   HENT:     Head: Normocephalic and atraumatic.  Eyes:     Extraocular Movements: Extraocular movements intact.     Conjunctiva/sclera: Conjunctivae normal.  Cardiovascular:     Rate and Rhythm: Normal rate and regular rhythm.  Pulmonary:     Effort: Pulmonary effort is normal.     Comments: On nasal cannula. Crackles at bases.  No increased WOB.  Genitourinary:    Comments: Foley in place.  Musculoskeletal:        General: Swelling and tenderness present. Normal range of motion.     Cervical back: Normal range of motion and neck supple.     Right lower leg: No edema.     Left lower leg: No edema.     Comments: Mild swelling, erythema of right wrist at old PIV site.  No fluctuance. Current PIV without any concerning findings.   Skin:    Findings: Erythema present.  Neurological:     General: No focal deficit present.     Mental Status: She is oriented to person, place, and time.  Psychiatric:         Mood and Affect: Mood normal.        Behavior: Behavior normal.     Lab Results: Lab Results  Component Value Date   WBC 7.1 06/04/2021   HGB 7.9 (L) 06/04/2021   HCT 24.5 (L) 06/04/2021   MCV 92.5 06/04/2021   PLT 172 06/04/2021    Lab Results  Component Value Date   NA 135 06/05/2021   K 2.9 (L) 06/05/2021   CO2 29 06/05/2021   GLUCOSE 150 (H) 06/05/2021   BUN 36 (H) 06/05/2021   CREATININE 3.97 (H) 06/05/2021   CALCIUM 8.4 (L) 06/05/2021   GFRNONAA 12 (L) 06/05/2021   GFRAA 27 (L) 10/29/2020    Lab Results  Component Value Date   ALT 17 06/04/2021   AST 51 (H) 06/04/2021   ALKPHOS 88 06/04/2021   BILITOT 1.4 (H) 06/04/2021    No results found for: CRP     Component Value Date/Time   ESRSEDRATE 30 (H) 05/11/2011 1220    I have reviewed the micro and lab results in Epic.  Imaging: NM Pulmonary Perfusion  Result Date: 06/04/2021 CLINICAL DATA:  Shortness of breath EXAM: NUCLEAR MEDICINE PERFUSION LUNG SCAN TECHNIQUE: Perfusion images were obtained in multiple projections after intravenous injection of radiopharmaceutical. Ventilation scans intentionally deferred if perfusion scan and chest x-ray adequate for interpretation during COVID 19 epidemic. RADIOPHARMACEUTICALS:  4.0 mCi Tc-41mMAA IV COMPARISON:  Chest x-ray from earlier in the same day FINDINGS: No S-shaped defects are noted on perfusion images to suggest pulmonary embolism. IMPRESSION: No evidence of pulmonary embolism. Electronically Signed   By: MInez CatalinaM.D.   On: 06/04/2021 14:36   DG CHEST PORT 1 VIEW  Result Date: 06/04/2021 CLINICAL DATA:  Shortness of breath. History of congestive heart failure. EXAM: PORTABLE CHEST 1 VIEW COMPARISON:  06/02/2021 FINDINGS: Cardiomegaly. Mediastinal shadows are normal. Bilateral effusions, left larger than right, with lower lobe atelectasis and or pneumonia. Mild edema in the lungs elsewhere. IMPRESSION: Findings consistent with congestive heart failure.  Enlarged cardiac silhouette. Bilateral effusions left larger than right. Atelectatic changes in the lower lungs left more than right. Mild edema pattern. Electronically Signed   By: MNelson ChimesM.D.   On: 06/04/2021 11:33  ECHOCARDIOGRAM COMPLETE  Result Date: 06/03/2021    ECHOCARDIOGRAM REPORT   Patient Name:   JOLIE BRANDOW Date of Exam: [id number] Medical Rec #:  [id number]      Height:       64.0 in Accession #:    [id number]     Weight:       109.8 lb Date of Birth:  Jun 09, 1961     BSA:          1.516 m Patient Age:    11 years       BP:           135/54 mmHg Patient Gender: F              HR:           79 bpm. Exam Location:  Inpatient Procedure: Cardiac Doppler and Color Doppler Indications:    CHF  History:        Patient has prior history of Echocardiogram examinations. CHF;                 Risk Factors:Diabetes and Hypertension.  Sonographer:    Luisa Hart RDCS Referring Phys: [phone] [facility]  Sonographer Comments: Image acquisition challenging due to uncooperative patient. IMPRESSIONS  1. Left ventricular ejection fraction, by estimation, is 55 to 60%. The left ventricle has normal function. The left ventricle has no regional wall motion abnormalities. Left ventricular diastolic parameters are consistent with Grade II diastolic dysfunction (pseudonormalization).  2. Right ventricular systolic function is normal. The right ventricular size is normal.  3. Left atrial size was mildly dilated.  4. Right atrial size was moderately dilated.  5. The mitral valve is grossly normal. Mild mitral valve regurgitation. No evidence of mitral stenosis.  6. The aortic valve is grossly normal. Aortic valve regurgitation is not visualized. No aortic stenosis is present. Comparison(s): A prior study was performed on 11/21/20. Prior images reviewed side by side. No siginificant change. Aortic valve morphology better seen in 2021 study. FINDINGS  Left Ventricle: Left ventricular ejection fraction, by estimation,  is 55 to 60%. The left ventricle has normal function. The left ventricle has no regional wall motion abnormalities. The left ventricular internal cavity size was normal in size. There is  no left ventricular hypertrophy. Left ventricular diastolic parameters are consistent with Grade II diastolic dysfunction (pseudonormalization). Right Ventricle: The right ventricular size is normal. No increase in right ventricular wall thickness. Right ventricular systolic function is normal. Left Atrium: Left atrial size was mildly dilated. Right Atrium: Right atrial size was moderately dilated. Pericardium: Trivial pericardial effusion is present. Mitral Valve: The mitral valve is grossly normal. Mild mitral valve regurgitation. No evidence of mitral valve stenosis. Tricuspid Valve: The tricuspid valve is normal in structure. Tricuspid valve regurgitation is mild . No evidence of tricuspid stenosis. Aortic Valve: The aortic valve is grossly normal. Aortic valve regurgitation is not visualized. No aortic stenosis is present. Aortic valve mean gradient measures 5.0 mmHg. Aortic valve peak gradient measures 8.8 mmHg. Aortic valve area, by VTI measures 1.70 cm. Pulmonic Valve: The pulmonic valve was normal in structure. Pulmonic valve regurgitation is mild. No evidence of pulmonic stenosis. Aorta: The aortic root and ascending aorta are structurally normal, with no evidence of dilitation. IAS/Shunts: The atrial septum is grossly normal.  LEFT VENTRICLE PLAX 2D LVIDd:         5.40 cm     Diastology LVIDs:         3.00 cm  LV e' medial:    4.67 cm/s LV PW:         1.00 cm     LV E/e' medial:  31.5 LV IVS:        0.90 cm     LV e' lateral:   4.28 cm/s LVOT diam:     1.80 cm     LV E/e' lateral: 34.3 LV SV:         52 LV SV Index:   34 LVOT Area:     2.54 cm  LV Volumes (MOD) LV vol d, MOD A2C: 48.5 ml LV vol d, MOD A4C: 54.8 ml LV vol s, MOD A2C: 21.4 ml LV vol s, MOD A4C: 19.6 ml LV SV MOD A2C:     27.1 ml LV SV MOD A4C:      54.8 ml LV SV MOD BP:      31.6 ml RIGHT VENTRICLE RV Basal diam:  3.70 cm RV Mid diam:    3.40 cm RV S prime:     14.40 cm/s TAPSE (M-mode): 2.5 cm LEFT ATRIUM             Index       RIGHT ATRIUM           Index LA diam:        4.50 cm 2.97 cm/m  RA Area:     20.20 cm LA Vol (A2C):   37.2 ml 24.54 ml/m RA Volume:   62.30 ml  41.09 ml/m LA Vol (A4C):   79.9 ml 52.70 ml/m LA Biplane Vol: 57.9 ml 38.19 ml/m  AORTIC VALVE                    PULMONIC VALVE AV Area (Vmax):    1.94 cm     PV Vmax:       1.35 m/s AV Area (Vmean):   1.85 cm     PV Vmean:      92.900 cm/s AV Area (VTI):     1.70 cm     PV VTI:        0.271 m AV Vmax:           148.00 cm/s  PV Peak grad:  7.3 mmHg AV Vmean:          105.000 cm/s PV Mean grad:  4.0 mmHg AV VTI:            0.304 m AV Peak Grad:      8.8 mmHg AV Mean Grad:      5.0 mmHg LVOT Vmax:         113.00 cm/s LVOT Vmean:        76.300 cm/s LVOT VTI:          0.203 m LVOT/AV VTI ratio: 0.67  AORTA Ao Root diam: 2.60 cm Ao Asc diam:  2.40 cm MITRAL VALVE                TRICUSPID VALVE MV Area (PHT): 7.16 cm     TR Peak grad:   24.2 mmHg MV Decel Time: 106 msec     TR Vmax:        246.00 cm/s MV E velocity: 147.00 cm/s MV A velocity: 55.70 cm/s   SHUNTS MV E/A ratio:  2.64         Systemic VTI:  0.20 m  Systemic Diam: 1.80 cm Rudean Haskell MD Electronically signed by Rudean Haskell MD Signature Date/Time: 06/03/2021/4:23:54 PM    Final      Imaging independently reviewed in Epic.  [facility] for Infectious Disease [facility] [phone] pager 06/05/2021, 10:55 AM

## 2021-06-06 DIAGNOSIS — E440 Moderate protein-calorie malnutrition: Secondary | ICD-10-CM | POA: Insufficient documentation

## 2021-06-06 DIAGNOSIS — E43 Unspecified severe protein-calorie malnutrition: Secondary | ICD-10-CM | POA: Insufficient documentation

## 2021-06-06 LAB — GLUCOSE, CAPILLARY
Glucose-Capillary: 91 mg/dL (ref 70–99)
Glucose-Capillary: 187 mg/dL — ABNORMAL HIGH (ref 70–99)
Glucose-Capillary: 168 mg/dL — ABNORMAL HIGH (ref 70–99)
Glucose-Capillary: 171 mg/dL — ABNORMAL HIGH (ref 70–99)

## 2021-06-06 LAB — CBC
HCT: 25.5 % — ABNORMAL LOW (ref 36.0–46.0)
Hemoglobin: 8.2 g/dL — ABNORMAL LOW (ref 12.0–15.0)
MCH: 29.3 pg (ref 26.0–34.0)
MCHC: 32.2 g/dL (ref 30.0–36.0)
MCV: 91.1 fL (ref 80.0–100.0)
Platelets: 156 10*3/uL (ref 150–400)
RBC: 2.8 MIL/uL — ABNORMAL LOW (ref 3.87–5.11)
RDW: 13.7 % (ref 11.5–15.5)
WBC: 5.3 10*3/uL (ref 4.0–10.5)
nRBC: 0 % (ref 0.0–0.2)

## 2021-06-06 LAB — BASIC METABOLIC PANEL
Anion gap: 9 (ref 5–15)
BUN: 34 mg/dL — ABNORMAL HIGH (ref 8–23)
CO2: 29 mmol/L (ref 22–32)
Calcium: 8.8 mg/dL — ABNORMAL LOW (ref 8.9–10.3)
Chloride: 97 mmol/L — ABNORMAL LOW (ref 98–111)
Creatinine, Ser: 3.35 mg/dL — ABNORMAL HIGH (ref 0.44–1.00)
GFR, Estimated: 15 mL/min — ABNORMAL LOW (ref >60)
Glucose, Bld: 95 mg/dL (ref 70–99)
Potassium: 4.4 mmol/L (ref 3.5–5.1)
Sodium: 135 mmol/L (ref 135–145)

## 2021-06-06 LAB — CULTURE, BLOOD (ROUTINE X 2): Special Requests: ADEQUATE

## 2021-06-06 MED ORDER — AMLODIPINE BESYLATE 5 MG PO TABS
5.0000 mg | ORAL_TABLET | Freq: Every day | ORAL | Status: DC
  Administered 2021-06-06 – 2021-06-08 (×3): 5 mg via ORAL
  Filled 2021-06-06 (×3): qty 1

## 2021-06-06 NOTE — Progress Notes (Signed)
[name] Progress Note   Assessment/ Plan:    AKI on CKD IV: in the setting of hypoglyemic episode and CHF exacerbation on the background of RAS.  She will be very sensitive to even the smallest hypotensive episode in the setting of bilateral RAS.  Renal US without obstruction 06/01/21.   Plan: - priority will be fluid removal - stopped hydralazine - continue Lasix 80 mg IV BID--> hoping to transition to PO in a day or two, likely tomorrow - previously intervention not pursued for RAS- consider, would be more of a long-term plan - ordered UA (negative) and UP/C- pending - no indication for dialysis - Cr improving and diuresis is going well - d/c Foley   2.  dCHF exacerbation: metoprolol, imdur, stopped hydralazine as above as well as increased Lasix   3.  Hypoglycemia: off all oral hypoglycemics, CBGs have been adequate.  Q 2 CBG per primary  4.  Acute hypoxic RF: presumably to pulm edema- increased diuresis as above  5.  History of adrenal insufficiency: s/p cosyntropin test per primary  6.  Possible HE: on lactulose   7.  MSSA bacteremia- had fever, blood cultures drawn on 6/15. Started on Ancef 6/15- s/p thoracentesis 6/17   8.  Dispo: pending  Subjective:    Doing well.  Had thora yesterday on the R, 400 mL fluid obtained.  O2 requirement down.  Eating breakfast.   Objective:   BP (!) 137/44 (BP Location: Left Arm)   Pulse 60   Temp 98.1 F (36.7 C) (Axillary)   Resp 14   Wt 50.2 kg   SpO2 100%   BMI 19.00 kg/m   Physical Exam: GEN: sitting in bed HEENT EOMI PERRL NECK +JVD much improved PULM clear now CV RRR ABD soft, nontender EXT no LE edema  Labs: BMET Recent Labs  Lab 06/01/21 0145 06/02/21 0103 06/03/21 0204 06/04/21 0021 06/05/21 0113 06/06/21 0536  NA 131* 129* 132* 133* 135 135  K 4.3 4.2 3.8 3.5 2.9* 4.4  CL 100 98 96* 97* 96* 97*  CO2 21* '22 25 26 29 29  '$ GLUCOSE 54* 100* 107* 112* 150* 95  BUN 43* 44* 43* 41* 36*  34*  CREATININE 4.06* 4.33* 4.54* 4.18* 3.97* 3.35*  CALCIUM 8.8* 8.7* 8.8* 8.5* 8.4* 8.8*   CBC Recent Labs  Lab 06/01/21 0324 06/03/21 0204 06/04/21 0021 06/06/21 0536  WBC 7.1 4.5 7.1 5.3  NEUTROABS 4.8  --  5.3  --   HGB 7.6* 7.3* 7.9* 8.2*  HCT 25.0* 22.9* 24.5* 25.5*  MCV 99.2 93.5 92.5 91.1  PLT 185 170 172 156      Medications:     amLODipine  5 mg Oral Daily   aspirin  81 mg Oral Daily   atorvastatin  20 mg Oral Daily   calcium-vitamin D  2 tablet Oral Q breakfast   Chlorhexidine Gluconate Cloth  6 each Topical Daily   dextrose  1 ampule Intravenous Once   feeding supplement  237 mL Oral TID BM   fluticasone  2 spray Each Nare Daily   furosemide  80 mg Intravenous BID   heparin  5,000 Units Subcutaneous Q8H   isosorbide mononitrate  60 mg Oral Daily   lactulose  20 g Oral TID   levothyroxine  150 mcg Oral Q0600   metoprolol succinate  25 mg Oral Daily   multivitamin with minerals  1 tablet Oral Daily   pantoprazole  80 mg Oral Daily  polyethylene glycol  17 g Oral Daily   sodium chloride flush  3 mL Intravenous Q12H   vitamin B-12  1,000 mcg Oral Daily     Madelon Lips MD 06/06/2021, 10:00 AM

## 2021-06-06 NOTE — Evaluation (Signed)
Physical Therapy Evaluation Patient Details Name: [name] MRN: [id number] DOB: 16-Nov-1961 Today's Date: 06/06/2021   History of Present Illness  Pt is a 60 y.o. F who presents with SOB, imaging showed bilateral pleural effusions and worsening renal function. Significant PMH: CKD stage 4, DM2, CHF.  Clinical Impression  Pt admitted with above. Presents with decreased functional mobility secondary to weakness and decreased cardiopulmonary endurance. Pt ambulating x 120 feet with a walker at a min guard assist level; SpO2 89-91% on RA. Cues for activity pacing and pursed lip breathing. Will continue to progress mobility as tolerated. Do not anticipate need for PT follow up.     Follow Up Recommendations No PT follow up;Supervision for mobility/OOB    Equipment Recommendations  None recommended by PT    Recommendations for Other Services       Precautions / Restrictions Precautions Precautions: Fall Restrictions Weight Bearing Restrictions: No      Mobility  Bed Mobility Overal bed mobility: Modified Independent                  Transfers Overall transfer level: Needs assistance Equipment used: Rolling walker (2 wheeled) Transfers: Sit to/from Stand Sit to Stand: Supervision            Ambulation/Gait Ambulation/Gait assistance: Min guard Gait Distance (Feet): 120 Feet Assistive device: Rolling walker (2 wheeled) Gait Pattern/deviations: Step-through pattern;Decreased stride length     General Gait Details: Cues for upward gaze, min guard assist for balance, no overt LOB noted  Stairs            Wheelchair Mobility    Modified Rankin (Stroke Patients Only)       Balance Overall balance assessment: Needs assistance Sitting-balance support: Feet supported Sitting balance-Leahy Scale: Good     Standing balance support: Bilateral upper extremity supported Standing balance-Leahy Scale: Fair                                Pertinent Vitals/Pain Pain Assessment: No/denies pain    Home Living Family/patient expects to be discharged to:: Private residence Living Arrangements: Children Available Help at Discharge: Family;Available 24 hours/day Type of Home: House Home Access: Stairs to enter   [employer] of Steps: 7 Home Layout: Two level Home Equipment: Environmental consultant - 2 wheels      Prior Function Level of Independence: Needs assistance   Gait / Transfers Assistance Needed: using walker, pt family helping with stair negotiation  ADL's / Homemaking Assistance Needed: independent        Hand Dominance        Extremity/Trunk Assessment   Upper Extremity Assessment Upper Extremity Assessment: Overall WFL for tasks assessed    Lower Extremity Assessment Lower Extremity Assessment: Overall WFL for tasks assessed       Communication   Communication: No difficulties  Cognition Arousal/Alertness: Awake/alert Behavior During Therapy: WFL for tasks assessed/performed Overall Cognitive Status: No family/caregiver present to determine baseline cognitive functioning                                 General Comments: Appropriate for basic mobility evaluation      General Comments      Exercises     Assessment/Plan    PT Assessment Patient needs continued PT services  PT Problem List Decreased balance;Decreased mobility;Decreased cognition;Decreased safety awareness  PT Treatment Interventions DME instruction;Gait training;Stair training;Functional mobility training;Therapeutic activities;Therapeutic exercise;Balance training;Cognitive remediation;Patient/family education    PT Goals (Current goals can be found in the Care Plan section)  Acute Rehab PT Goals Patient Stated Goal: did not state PT Goal Formulation: With patient Time For Goal Achievement: 06/20/21 Potential to Achieve Goals: Good    Frequency Min 3X/week   Barriers to discharge         Co-evaluation               AM-PAC PT "6 Clicks" Mobility  Outcome Measure Help needed turning from your back to your side while in a flat bed without using bedrails?: None Help needed moving from lying on your back to sitting on the side of a flat bed without using bedrails?: None Help needed moving to and from a bed to a chair (including a wheelchair)?: A Little Help needed standing up from a chair using your arms (e.g., wheelchair or bedside chair)?: A Little Help needed to walk in hospital room?: A Little Help needed climbing 3-5 steps with a railing? : A Little 6 Click Score: 20    End of Session   Activity Tolerance: Patient tolerated treatment well Patient left: in bed;with call bell/phone within reach;with bed alarm set Nurse Communication: Mobility status PT Visit Diagnosis: Unsteadiness on feet (R26.81);Dizziness and giddiness (R42)    Time: 1030-1049 PT Time Calculation (min) (ACUTE ONLY): 19 min   Charges:   PT Evaluation $PT Eval Moderate Complexity: [place], PT, DPT Acute Rehabilitation Services Pager [phone] Office [phone]   Deno Etienne 06/06/2021, 12:12 PM

## 2021-06-06 NOTE — Progress Notes (Signed)
PROGRESS NOTE    [name]   [id number]  DOB: [birth year]  PCP: Ladell Pier, MD    DOA: 06/01/2021 LOS: 5   Assessment & Plan   Principal Problem:   Bacteremia due to methicillin susceptible Staphylococcus aureus (MSSA) Active Problems:   CKD (chronic kidney disease) stage 4, GFR 15-29 ml/min (HCC)   HTN (hypertension)   Anemia due to chronic kidney disease   Adrenal insufficiency (Addison's disease) (HCC)   AKI (acute kidney injury) ([place])   Acute on chronic diastolic CHF (congestive heart failure) (HCC)   DM2 (diabetes mellitus, type 2) (HCC)   CHF exacerbation (HCC)   Volume overload   Respiratory failure (HCC)   Protein-calorie malnutrition, severe   Acute respiratory failure with hypoxia due to pulmonary edema and bilateral pleural effusions -  Hypoxic in ED and started on 2L >> 6L, not on home O2. Further increased oxygen needs, on 10 L/min HFNC. ABG normal pCO2 and pH, hypoxemic w/ pO2 54. CXR with persistent bilateral pleural effusions and improving but not resolved b/l infiltrates. --Supplement O2, target sat > 90%, wean as tolerated --Treat underlying conditions as below (renal failure, CHF, ?PNA given new fever this afternoon) 6/15: procal 0/24, elevated D-dimer 6/16: V/Q scan negative for PE, consistent with CHF; afebrile today --follow blood cultures --US-guided thoracentesis 6/17 AM - 400 mL fluid removed --Follow up pleural fluid studies --ongoing diuresis as below  Fevers of unknown cause, MSSA in 1 of 4 bottles Blood Culture -  --ID consulted --Started on Ancef --see ID recs No clear source or symptoms of infection.  Right Upper Extremity Edema - RUE venous doppler U/S pending.   AKI superimposed on CKD stage 4 -  Baseline Cr appears ~2.5.  Presented with Cr 4.0 -> 4.3. Renal U/S negative for hydronephrosis / obstruction. --Nephrology following, appreciate input --Foley for I/O's per prior attending, d/c asap   Acute on chronic  diastolic CHF - with hypoxia due to pleural effusions and pulmonary edema, elevated BNP.   Echo 6/15: EF 55-60%, grade II diastolic dysfunction, biatrial dilation On IV Lasix per nephrology. Net IO Since Admission: -5,245 mL [06/06/21 1548] --Diuretics per nephrology --Strict I/O's and daily weights --Follow up pending Echo --Continue metoprolol, Imdur --Hydralazine stopped --On IV Lasix 80 mg BID - good UO continues  Acute metabolic encephalopathy -Improved. Likely multifactorial due to hypoglycemia, hyperammonemia, hypoxia, ?infection.   --Treat underlying causes as above/below --Continue lactulose --Delirium precautions:     -Lights and TV off, minimize interruptions at night    -Blinds open and lights on during day    -Glasses/hearing aid with patient    -Frequent reorientation    -PT/OT when able    -Avoid sedation medications/Be[place] list medications   Hypoglycemia / Type 2 diabetes - improved. Most likely 2/2 Prandin in the setting of worsening renal failure vs. Less likely adrenal insufficiency.   CBG's in low 100's, some 70-80's --Monitor closely --Hold antihyperglycemic agents --Hypoglycemia protocol --Resume CBG's routine AC/HS as pt alert and eating well now  Hyponatremia - Resolved. Suspect multifactorial due to volume overload and ?adrenal insufficiency.  TSH normal. --AI under evaluation as below --Follow BMP daily  Hypokalemia - Resolved after replacment --Folllow BMP and replace as needed  Hyperammonemia ? Hepatic encephalopathy --Continue lactulose --Titrate lactulose dosing for 3+ soft BM's daily CT a/p on 05/12/21 with no liver or biliary abnormalities seen.   Possible history of adrenal insufficiency Pt follows with Dr. Loanne Drilling.   Previously on Solu-cortef, appears was  d/c'd at last visit. Cortisol level on admission is 2.5 Prior attending discussed with Dr. Dwyane Dee, on call for endocrinology, recommended Cosyntropin stim test. This was not properly  collected and was re-ordered for this AM. --ACTH levels and stim test pending - follow --Hold off starting steroid for now, concern for ?infection given fever 6/15 --Recommend endocrinology follow up outpatient   Hypothyroidism chronic, controlled. TSH normal, FT4 normal. --Continue levothyroxine   Anemia of chronic disease - baseline Hbg 7.8.  No active bleeding. --Follow CBC daily   HTN - chronic, stable.  Due to Bilateral RAS. --Continue metoprolol, amlodipine --Renal stopped hydralazine  --Currently on IV Lasix 80 mg BID per nephro --Consider intervention for RAS     Patient BMI: Body mass index is 19 kg/m.   DVT prophylaxis: heparin injection 5,000 Units Start: 06/01/21 0600   Diet:  Diet Orders (From admission, onward)     Start     Ordered   06/03/21 0947  Diet Carb Modified Fluid consistency: Thin; Room service appropriate? Yes; Fluid restriction: 1500 mL Fluid  Diet effective now       Question Answer Comment  Diet-HS Snack? Nothing   Calorie Level Medium 1600-2000   Fluid consistency: Thin   Room service appropriate? Yes   Fluid restriction: 1500 mL Fluid      06/03/21 0946              Code Status: Full Code   Brief Narrative / Hospital Course to Date:   60 y/o female with history of CKD stage 4, DM, bilateral RAS admitted with AMS related to hypoglycemia. She was noted to be short of breath and imaging showed bilateral pleural effusions, had worsening renal function.  Admitted to [facility] service with nephrology and pulmonology consulted.    Undergoing diuresis with IV Lasix.  z Hypoglycemia has improved.   Patient remains encephalopathic.   Lactulose started due to hyperammonemia potentially contributing to mental status changes.  Transferred to [facility] on 6/14 due to increased oxygen requirement and work of breathing.  Remains on 10 L/min HFNC oxygen.  Pulmonology following.      Subjective 06/06/21    Pt sleeping but woke to voice this  AM.  Reports she's feeling okay.  Her only complaint in some Left hand pain, thumb side of the wrist where an IV had been previously.   Disposition Plan & Communication   Status is: Inpatient  Remains inpatient appropriate because:IV treatments appropriate due to intensity of illness or inability to take PO  Dispo: The patient is from: Home              Anticipated d/c is to: Home              Patient currently is not medically stable to d/c.   Difficult to place patient No   Family Communication: Mother was at bedside on round 6/17. None present today   Consults, Procedures, Significant Events   Consultants:  Nephrology PCCM  Procedures:     Antimicrobials:  Anti-infectives (From admission, onward)    Start     Dose/Rate Route Frequency Ordered Stop   06/04/21 2030  ceFAZolin (ANCEF) IVPB 2g/100 mL premix        2 g 200 mL/hr over 30 Minutes Intravenous Every 12 hours 06/04/21 1940           Micro    Objective   Vitals:   06/06/21 0738 06/06/21 0801 06/06/21 1130 06/06/21 1535  BP: (!) 123/43 Marland Kitchen)  137/44 (!) 121/45 (!) 125/41  Pulse: (!) 55 60 (!) 54 63  Resp: '14 14 12 16  '$ Temp: 98.6 F (37 C) 98.1 F (36.7 C) 98.1 F (36.7 C) 98.5 F (36.9 C)  TempSrc: Oral Axillary Oral Oral  SpO2: 100% 100% 100% 93%  Weight:        Intake/Output Summary (Last 24 hours) at 06/06/2021 1548 Last data filed at 06/06/2021 1133 Gross per 24 hour  Intake 480 ml  Output 950 ml  Net -470 ml   Filed Weights   06/03/21 0418 06/05/21 0400 06/06/21 0300  Weight: 49.8 kg 46.2 kg 50.2 kg    Physical Exam:  General exam: sleeping comfortably, wakes to voice, no acute distress Respiratory system: CTAB, no wheezes, on 1 L/min supplemental oxygen. Cardiovascular system: normal S1/S2, RRR Central nervous system: grossly non-focal exam, normal speech Psychiatry: normal mood, congruent affect, judgment and insight appear intact  Labs   Data Reviewed: I have personally  reviewed following labs and imaging studies  CBC: Recent Labs  Lab 06/01/21 0324 06/03/21 0204 06/04/21 0021 06/06/21 0536  WBC 7.1 4.5 7.1 5.3  NEUTROABS 4.8  --  5.3  --   HGB 7.6* 7.3* 7.9* 8.2*  HCT 25.0* 22.9* 24.5* 25.5*  MCV 99.2 93.5 92.5 91.1  PLT 185 170 172 [id number]   Basic Metabolic Panel: Recent Labs  Lab 06/02/21 0103 06/03/21 0204 06/04/21 0021 06/05/21 0113 06/06/21 0536  NA 129* 132* 133* 135 135  K 4.2 3.8 3.5 2.9* 4.4  CL 98 96* 97* 96* 97*  CO2 '22 25 26 29 29  '$ GLUCOSE 100* 107* 112* 150* 95  BUN 44* 43* 41* 36* 34*  CREATININE 4.33* 4.54* 4.18* 3.97* 3.35*  CALCIUM 8.7* 8.8* 8.5* 8.4* 8.8*   GFR: Estimated Creatinine Clearance: 14 mL/min (A) (by C-G formula based on SCr of 3.35 mg/dL (H)). Liver Function Tests: Recent Labs  Lab 06/01/21 0145 06/04/21 1044  AST 27 51*  ALT 12 17  ALKPHOS 127* 88  BILITOT 0.9 1.4*  PROT 7.0 6.3*  ALBUMIN 3.4* 3.0*   No results for input(s): LIPASE, AMYLASE in the last 168 hours. Recent Labs  Lab 06/02/21 0957 06/03/21 0204 06/04/21 0021  AMMONIA 82* 19 44*   Coagulation Profile: Recent Labs  Lab 06/04/21 1044  INR 1.2   Cardiac Enzymes: No results for input(s): CKTOTAL, CKMB, CKMBINDEX, TROPONINI in the last 168 hours. BNP (last 3 results) No results for input(s): PROBNP in the last 8760 hours. HbA1C: No results for input(s): HGBA1C in the last 72 hours. CBG: Recent Labs  Lab 06/05/21 1135 06/05/21 1643 06/05/21 2110 06/06/21 0615 06/06/21 1203  GLUCAP 196* 124* 165* 91 187*   Lipid Profile: No results for input(s): CHOL, HDL, LDLCALC, TRIG, CHOLHDL, LDLDIRECT in the last 72 hours. Thyroid Function Tests: No results for input(s): TSH, T4TOTAL, FREET4, T3FREE, THYROIDAB in the last 72 hours.  Anemia Panel: No results for input(s): VITAMINB12, FOLATE, FERRITIN, TIBC, IRON, RETICCTPCT in the last 72 hours. Sepsis Labs: Recent Labs  Lab 06/01/21 0145 06/03/21 1549 06/03/21 1846   PROCALCITON  --  0.24  --   LATICACIDVEN 1.1 0.9 0.7    Recent Results (from the past 240 hour(s))  SARS CORONAVIRUS 2 (TAT 6-24 HRS) Nasopharyngeal Nasopharyngeal Swab     Status: None   Collection Time: 06/01/21  1:09 AM   Specimen: Nasopharyngeal Swab  Result Value Ref Range Status   SARS Coronavirus 2 NEGATIVE NEGATIVE Final    Comment: (NOTE)  SARS-CoV-2 target nucleic acids are NOT DETECTED.  The SARS-CoV-2 RNA is generally detectable in upper and lower respiratory specimens during the acute phase of infection. Negative results do not preclude SARS-CoV-2 infection, do not rule out co-infections with other pathogens, and should not be used as the sole basis for treatment or other patient management decisions. Negative results must be combined with clinical observations, patient history, and epidemiological information. The expected result is Negative.  Fact Sheet for Patients: SugarRoll.be  Fact Sheet for Healthcare Providers: https://www.woods-mathews.com/  This test is not yet approved or cleared by the [place] FDA and  has been authorized for detection and/or diagnosis of SARS-CoV-2 by FDA under an Emergency Use Authorization (EUA). This EUA will remain  in effect (meaning this test can be used) for the duration of the COVID-19 declaration under Se ction 564(b)(1) of the Act, 21 U.S.C. section 360bbb-3(b)(1), unless the authorization is terminated or revoked sooner.  Performed at [facility], [place] [address] [place], [place] [address]   Culture, blood (routine x 2)     Status: Abnormal   Collection Time: 06/03/21  3:49 PM   Specimen: BLOOD  Result Value Ref Range Status   Specimen Description BLOOD LEFT ANTECUBITAL  Final   Special Requests   Final    BOTTLES DRAWN AEROBIC AND ANAEROBIC Blood Culture adequate volume   Culture  Setup Time   Final    GRAM POSITIVE COCCI IN CLUSTERS ANAEROBIC BOTTLE  ONLY CRITICAL RESULT CALLED TO, READ BACK BY AND VERIFIED WITHDion Body PHARMD 1911 06/04/21 A BROWNING Performed at [facility], [place] [address] [place],  [address]    Culture STAPHYLOCOCCUS AUREUS (A)  Final   Report Status 06/06/2021 FINAL  Final   Organism ID, Bacteria STAPHYLOCOCCUS AUREUS  Final      Susceptibility   Staphylococcus aureus - MIC*    CIPROFLOXACIN <=0.5 SENSITIVE Sensitive     ERYTHROMYCIN <=0.25 SENSITIVE Sensitive     GENTAMICIN <=0.5 SENSITIVE Sensitive     OXACILLIN 0.5 SENSITIVE Sensitive     TETRACYCLINE <=1 SENSITIVE Sensitive     VANCOMYCIN 1 SENSITIVE Sensitive     TRIMETH/SULFA <=10 SENSITIVE Sensitive     CLINDAMYCIN <=0.25 SENSITIVE Sensitive     RIFAMPIN <=0.5 SENSITIVE Sensitive     Inducible Clindamycin NEGATIVE Sensitive     * STAPHYLOCOCCUS AUREUS  Blood Culture ID Panel (Reflexed)     Status: Abnormal   Collection Time: 06/03/21  3:49 PM  Result Value Ref Range Status   Enterococcus faecalis NOT DETECTED NOT DETECTED Final   Enterococcus Faecium NOT DETECTED NOT DETECTED Final   Listeria monocytogenes NOT DETECTED NOT DETECTED Final   Staphylococcus species DETECTED (A) NOT DETECTED Final    Comment: CRITICAL RESULT CALLED TO, READ BACK BY AND VERIFIED WITHDion Body PHARMD 1911 06/04/21 A BROWNING    Staphylococcus aureus (BCID) DETECTED (A) NOT DETECTED Final    Comment: CRITICAL RESULT CALLED TO, READ BACK BY AND VERIFIED WITHDion Body PHARMD 1911 06/04/21 A BROWNING    Staphylococcus epidermidis NOT DETECTED NOT DETECTED Final   Staphylococcus lugdunensis NOT DETECTED NOT DETECTED Final   Streptococcus species NOT DETECTED NOT DETECTED Final   Streptococcus agalactiae NOT DETECTED NOT DETECTED Final   Streptococcus pneumoniae NOT DETECTED NOT DETECTED Final   Streptococcus pyogenes NOT DETECTED NOT DETECTED Final   A.calcoaceticus-baumannii NOT DETECTED NOT DETECTED Final   Bacteroides fragilis NOT DETECTED NOT DETECTED  Final   Enterobacterales NOT  DETECTED NOT DETECTED Final   Enterobacter cloacae complex NOT DETECTED NOT DETECTED Final   Escherichia coli NOT DETECTED NOT DETECTED Final   Klebsiella aerogenes NOT DETECTED NOT DETECTED Final   Klebsiella oxytoca NOT DETECTED NOT DETECTED Final   Klebsiella pneumoniae NOT DETECTED NOT DETECTED Final   Proteus species NOT DETECTED NOT DETECTED Final   Salmonella species NOT DETECTED NOT DETECTED Final   Serratia marcescens NOT DETECTED NOT DETECTED Final   Haemophilus influenzae NOT DETECTED NOT DETECTED Final   Neisseria meningitidis NOT DETECTED NOT DETECTED Final   Pseudomonas aeruginosa NOT DETECTED NOT DETECTED Final   Stenotrophomonas maltophilia NOT DETECTED NOT DETECTED Final   Candida albicans NOT DETECTED NOT DETECTED Final   Candida auris NOT DETECTED NOT DETECTED Final   Candida glabrata NOT DETECTED NOT DETECTED Final   Candida krusei NOT DETECTED NOT DETECTED Final   Candida parapsilosis NOT DETECTED NOT DETECTED Final   Candida tropicalis NOT DETECTED NOT DETECTED Final   Cryptococcus neoformans/gattii NOT DETECTED NOT DETECTED Final   Meth resistant mecA/C and MREJ NOT DETECTED NOT DETECTED Final    Comment: Performed at [facility], [place] [address], [place], [place] [address]  Culture, blood (routine x 2)     Status: None (Preliminary result)   Collection Time: 06/03/21  4:04 PM   Specimen: BLOOD  Result Value Ref Range Status   Specimen Description BLOOD LEFT ANTECUBITAL  Final   Special Requests   Final    BOTTLES DRAWN AEROBIC AND ANAEROBIC Blood Culture adequate volume   Culture   Final    NO GROWTH 2 DAYS Performed at [facility], [place] [address] [place], [place] [address]    Report Status PENDING  Incomplete  Body fluid culture w Gram Stain     Status: None (Preliminary result)   Collection Time: 06/05/21 10:36 AM   Specimen: Lung, Right; Pleural Fluid  Result Value Ref Range Status   Specimen Description  PLEURAL FLUID  Final   Special Requests RIGHT LUNG  Final   Gram Stain   Final    MODERATE WBC PRESENT, PREDOMINANTLY MONONUCLEAR NO ORGANISMS SEEN    Culture   Final    NO GROWTH < 24 HOURS Performed at [facility], [place] [address] [place],  [address]    Report Status PENDING  Incomplete      Imaging Studies   DG Chest 1 View  Result Date: 06/05/2021 CLINICAL DATA:  Post right thoracentesis with 400 mL removed. EXAM: CHEST  1 VIEW COMPARISON:  June 04, 2021. FINDINGS: Decreased right pleural effusion without visible residual effusion on the right. Small left pleural effusion. Overlying mild left basilar opacity. No visible pneumothorax. Similar enlarged cardiac silhouette. Central pulmonary vascular congestion. IMPRESSION: 1. Decreased right pleural effusion without visible right pleural effusion or visible pneumothorax. 2. Small left pleural effusion. Overlying mild left basilar opacity, most likely atelectasis. 3. Cardiomegaly and central pulmonary vascular congestion. Electronically Signed   By: Margaretha Sheffield MD   On: 06/05/2021 10:58   [place] RT UPPER EXTREM LTD SOFT TISSUE NON VASCULAR  Result Date: 06/05/2021 CLINICAL DATA:  Right wrist swelling following IV removal EXAM: ULTRASOUND RIGHT UPPER EXTREMITY LIMITED TECHNIQUE: Ultrasound examination of the upper extremity soft tissues was performed in the area of clinical concern. COMPARISON:  None. FINDINGS: Targeted ultrasound was performed of the soft tissues of the right wrist at site of patient's clinical concern. No soft tissue edema or fluid collection. IMPRESSION: No  soft tissue edema or fluid collection of the right wrist at site of patient's clinical concern. Electronically Signed   By: Davina Poke D.O.   On: 06/05/2021 16:53   ECHOCARDIOGRAM LIMITED  Result Date: 06/05/2021    ECHOCARDIOGRAM LIMITED REPORT   Patient Name:   [name] Date of Exam: [id number] Medical Rec #:  [id number]      Height:        64.0 in Accession #:    [id number]     Weight:       101.9 lb Date of Birth:  1961-07-10     BSA:          1.469 m Patient Age:    60 years       BP:           117/45 mmHg Patient Gender: F              HR:           70 bpm. Exam Location:  Inpatient Procedure: Limited Echo and Color Doppler Indications:    Bacteremia  History:        Patient has prior history of Echocardiogram examinations, most                 recent 06/03/2021. CHF; Risk Factors:Diabetes and Hypertension.  Sonographer:    Merrie Roof RDCS Referring Phys: [id number] [place]  1. Limited echo to look again for possible endocarditis.  2. Left ventricular ejection fraction, by estimation, is 55 to 60%. The left ventricle has normal function.  3. The mitral valve is abnormal. Trivial mitral valve regurgitation. Moderate mitral annular calcification.  4. The aortic valve is normal in structure. Aortic valve regurgitation is not visualized. No aortic stenosis is present. FINDINGS  Left Ventricle: Left ventricular ejection fraction, by estimation, is 55 to 60%. The left ventricle has normal function. Mitral Valve: The mitral valve is abnormal. There is moderate thickening of the mitral valve leaflet(s). There is mild calcification of the mitral valve leaflet(s). Moderate mitral annular calcification. Trivial mitral valve regurgitation. Tricuspid Valve: The tricuspid valve is grossly normal. Tricuspid valve regurgitation is mild. There is no evidence of tricuspid valve vegetation. Aortic Valve: The aortic valve is normal in structure. Aortic valve regurgitation is not visualized. No aortic stenosis is present. There is no evidence of aortic valve vegetation. Pulmonic Valve: The pulmonic valve was not well visualized. Pulmonic valve regurgitation is not visualized. Additional Comments: Limited echo to look again for possible endocarditis. TRICUSPID VALVE TR Peak grad:   33.6 mmHg TR Vmax:        290.00 cm/s Mertie Moores MD Electronically  signed by Mertie Moores MD Signature Date/Time: 06/05/2021/3:49:28 PM    Final    IR THORACENTESIS ASP PLEURAL SPACE W/IMG GUIDE  Result Date: 06/05/2021 INDICATION: History of congestive heart failure, shortness of breath. Bilateral pleural effusions. Request for diagnostic and therapeutic thoracentesis. EXAM: ULTRASOUND GUIDED RIGHT THORACENTESIS MEDICATIONS: 1% plain lidocaine, 5 mL COMPLICATIONS: None immediate. PROCEDURE: An ultrasound guided thoracentesis was thoroughly discussed with the patient and questions answered. The benefits, risks, alternatives and complications were also discussed. The patient understands and wishes to proceed with the procedure. Written consent was obtained. Imaging of the left chest demonstrates small effusion, not amenable for safe percutaneous thoracentesis. Imaging of the right chest demonstrates moderate effusion safe for percutaneous access. Ultrasound was performed to localize and mark an adequate pocket of fluid in the right chest. The area was then prepped and  draped in the normal sterile fashion. 1% Lidocaine was used for local anesthesia. Under ultrasound guidance a 6 Fr Safe-T-Centesis catheter was introduced. Thoracentesis was performed. The catheter was removed and a dressing applied. FINDINGS: A total of approximately 400 mL of clear yellow fluid was removed. Samples were sent to the laboratory as requested by the clinical team. IMPRESSION: Successful ultrasound guided right thoracentesis yielding 400 mL of pleural fluid. Read by: Ascencion Dike PA-C Electronically Signed   By: Markus Daft M.D.   On: 06/05/2021 10:59     Medications   Scheduled Meds:  amLODipine  5 mg Oral Daily   aspirin  81 mg Oral Daily   atorvastatin  20 mg Oral Daily   calcium-vitamin D  2 tablet Oral Q breakfast   Chlorhexidine Gluconate Cloth  6 each Topical Daily   dextrose  1 ampule Intravenous Once   feeding supplement  237 mL Oral TID BM   fluticasone  2 spray Each Nare Daily    furosemide  80 mg Intravenous BID   heparin  5,000 Units Subcutaneous Q8H   isosorbide mononitrate  60 mg Oral Daily   lactulose  20 g Oral TID   levothyroxine  150 mcg Oral Q0600   metoprolol succinate  25 mg Oral Daily   multivitamin with minerals  1 tablet Oral Daily   pantoprazole  80 mg Oral Daily   polyethylene glycol  17 g Oral Daily   sodium chloride flush  3 mL Intravenous Q12H   vitamin B-12  1,000 mcg Oral Daily   Continuous Infusions:  sodium chloride      ceFAZolin (ANCEF) IV 2 g (06/06/21 0906)       LOS: 5 days    Time spent: 25 minutes with > 50% spent at bedside and in coordination of care.    Ezekiel Slocumb, DO [facility]  06/06/2021, 3:48 PM      If 7PM-7AM, please contact night-coverage. How to contact the [facility] Attending or Consulting provider [place] or covering provider during after hours [place], for this patient?    Check the care team in [facility] and look for a) attending/consulting [facility] provider listed and b) the [facility] team listed Log into [email or web address] and use [place]'s universal password to access. If you do not have the password, please contact the hospital operator. Locate the [facility] provider you are looking for under [facility] and page to a number that you can be directly reached. If you still have difficulty reaching the provider, please page the [facility] (Director on Call) for the Hospitalists listed on amion for assistance.

## 2021-06-07 LAB — GLUCOSE, CAPILLARY
Glucose-Capillary: 92 mg/dL (ref 70–99)
Glucose-Capillary: 138 mg/dL — ABNORMAL HIGH (ref 70–99)
Glucose-Capillary: 103 mg/dL — ABNORMAL HIGH (ref 70–99)
Glucose-Capillary: 148 mg/dL — ABNORMAL HIGH (ref 70–99)

## 2021-06-07 LAB — BASIC METABOLIC PANEL
Anion gap: 11 (ref 5–15)
BUN: 31 mg/dL — ABNORMAL HIGH (ref 8–23)
CO2: 31 mmol/L (ref 22–32)
Calcium: 9.1 mg/dL (ref 8.9–10.3)
Chloride: 93 mmol/L — ABNORMAL LOW (ref 98–111)
Creatinine, Ser: 2.97 mg/dL — ABNORMAL HIGH (ref 0.44–1.00)
GFR, Estimated: 17 mL/min — ABNORMAL LOW (ref >60)
Glucose, Bld: 135 mg/dL — ABNORMAL HIGH (ref 70–99)
Potassium: 3.9 mmol/L (ref 3.5–5.1)
Sodium: 135 mmol/L (ref 135–145)

## 2021-06-07 MED ORDER — FUROSEMIDE 80 MG PO TABS
80.0000 mg | ORAL_TABLET | Freq: Two times a day (BID) | ORAL | Status: DC
  Administered 2021-06-07 – 2021-06-10 (×7): 80 mg via ORAL
  Filled 2021-06-07 (×7): qty 1

## 2021-06-07 NOTE — Evaluation (Signed)
Occupational Therapy Evaluation Patient Details Name: [name] MRN: [id number] DOB: [birth year] Today's Date: 06/07/2021    History of Present Illness Pt is a 60 y.o. F who presents with SOB, imaging showed bilateral pleural effusions and worsening renal function. Significant PMH: CKD stage 4, DM2, CHF.   Clinical Impression   Pt PTA Pt living with family and very supported; pt reports independence with ADL and family assists with iADL. Pt currently, Pt limited by R side pain intermittently. Pt performing own ADL at commode and while standing at sink. Pt reaching socks and maneuvering with RW with modified independence to [place]. Pt appears very close to baseline functioning. Pt does not require continued OT skilled services. OT signing off, thank you.    Follow Up Recommendations  No OT follow up    Equipment Recommendations  None recommended by OT    Recommendations for Other Services       Precautions / Restrictions Precautions Precautions: Fall Restrictions Weight Bearing Restrictions: No      Mobility Bed Mobility Overal bed mobility: Modified Independent Bed Mobility: Supine to Sit;Sit to Supine     Supine to sit: Modified independent (Device/Increase time);HOB elevated Sit to supine: Modified independent (Device/Increase time)   General bed mobility comments: no physical assist; prefers HOB elevated    Transfers Overall transfer level: Needs assistance Equipment used: Rolling walker (2 wheeled) Transfers: Sit to/from Stand Sit to Stand: Supervision         General transfer comment: Assist for lines    Balance Overall balance assessment: Needs assistance Sitting-balance support: Feet supported Sitting balance-Leahy Scale: Good     Standing balance support: Bilateral upper extremity supported;No upper extremity supported;During functional activity Standing balance-Leahy Scale: Fair Standing balance comment: performing grooming at sink  without UE supported                           ADL either performed or assessed with clinical judgement   ADL Overall ADL's : Modified independent                                     Functional mobility during ADLs: Supervision/safety;Rolling walker General ADL Comments: Pt limited by R side pain intermittently. Pt performing own ADL at commode and while standing at sink. Pt reaching socks and maneuvering with RW.     Vision Baseline Vision/History: No visual deficits Patient Visual Report: No change from baseline Vision Assessment?: No apparent visual deficits     Perception     Praxis      Pertinent Vitals/Pain Pain Assessment: Faces Faces Pain Scale: Hurts little more Pain Location: R side Pain Descriptors / Indicators: Discomfort Pain Intervention(s): Monitored during session;Repositioned     Hand Dominance Right   Extremity/Trunk Assessment Upper Extremity Assessment Upper Extremity Assessment: Overall WFL for tasks assessed   Lower Extremity Assessment Lower Extremity Assessment: Overall WFL for tasks assessed   Cervical / Trunk Assessment Cervical / Trunk Assessment: Normal   Communication Communication Communication: No difficulties   Cognition Arousal/Alertness: Awake/alert Behavior During Therapy: WFL for tasks assessed/performed Overall Cognitive Status: No family/caregiver present to determine baseline cognitive functioning                                 General Comments: Appropriate for basic mobility evaluation   General Comments  VSS on RA. Education for energy conservation techniques completed    Exercises     Shoulder Instructions      Home Living Family/patient expects to be discharged to:: Private residence Living Arrangements: Children Available Help at Discharge: Family;Available 24 hours/day Type of Home: House Home Access: Stairs to enter [employer] of Steps: 7   Home Layout:  Two level Alternate Level Stairs-Number of Steps: 6             Home Equipment: Walker - 2 wheels          Prior Functioning/Environment Level of Independence: Needs assistance  Gait / Transfers Assistance Needed: using walker, pt family helping with stair negotiation ADL's / Homemaking Assistance Needed: independent            OT Problem List: Decreased activity tolerance      OT Treatment/Interventions: Self-care/ADL training;Therapeutic exercise;DME and/or AE instruction;Therapeutic activities;Patient/family education;Balance training    OT Goals(Current goals can be found in the care plan section) Acute Rehab OT Goals Patient Stated Goal: to go home OT Goal Formulation: All assessment and education complete, DC therapy Potential to Achieve Goals: Good  OT Frequency: Min 2X/week   Barriers to D/C:            Co-evaluation              AM-PAC OT "6 Clicks" Daily Activity     Outcome Measure Help from another person eating meals?: None Help from another person taking care of personal grooming?: A Little Help from another person toileting, which includes using toliet, bedpan, or urinal?: A Little Help from another person bathing (including washing, rinsing, drying)?: A Little Help from another person to put on and taking off regular upper body clothing?: None Help from another person to put on and taking off regular lower body clothing?: None 6 Click Score: 21   End of Session Nurse Communication: Mobility status  Activity Tolerance: Patient tolerated treatment well Patient left: in bed;with call bell/phone within reach  OT Visit Diagnosis: Unsteadiness on feet (R26.81);Pain Pain - Right/Left: Right Pain - part of body:  (side)                Time: [id number] OT Time Calculation (min): 28 min Charges:  OT General Charges $OT Visit: 1 Visit OT Evaluation $OT Eval Moderate Complexity: 1 Mod OT Treatments $Self Care/Home Management : 8-22  mins   Jefferey Pica, OTR/L Acute Rehabilitation Services Pager: [phone] Office: [phone]   Johnedward Brodrick C 06/07/2021, 2:49 PM

## 2021-06-07 NOTE — Progress Notes (Signed)
Mobility Specialist - Progress Note   06/07/21 1130  Mobility  Activity Ambulated in hall  Level of Assistance Standby assist, set-up cues, supervision of patient - no hands on  Assistive Device Front wheel walker  Distance Ambulated (ft) 60 ft  Mobility Ambulated with assistance in hallway  Mobility Response Tolerated well  Mobility performed by Mobility specialist  $Mobility charge 1 Mobility   Pre-mobility: 56 HR During mobility: 64 HR Post-mobility: 58 HR  Pt asx throughout ambulation. Pt back in bed after walk, call bell at side. VSS throughout.  Pricilla Handler Mobility Specialist Mobility Specialist Phone: [phone]

## 2021-06-07 NOTE — Progress Notes (Signed)
PROGRESS NOTE    [name]   [id number]  DOB: Feb 19, 1961  PCP: Ladell Pier, MD    DOA: 06/01/2021 LOS: 6   Assessment & Plan   Principal Problem:   Bacteremia due to methicillin susceptible Staphylococcus aureus (MSSA) Active Problems:   CKD (chronic kidney disease) stage 4, GFR 15-29 ml/min (HCC)   HTN (hypertension)   Anemia due to chronic kidney disease   Adrenal insufficiency (Addison's disease) (HCC)   AKI (acute kidney injury) ([place])   Acute on chronic diastolic CHF (congestive heart failure) (HCC)   DM2 (diabetes mellitus, type 2) (HCC)   CHF exacerbation (HCC)   Volume overload   Respiratory failure (HCC)   Protein-calorie malnutrition, severe   Acute respiratory failure with hypoxia due to pulmonary edema and bilateral pleural effusions -  Hypoxic in ED and started on 2L >> 6L, not on home O2. Further increased oxygen needs, on 10 L/min HFNC. ABG normal pCO2 and pH, hypoxemic w/ pO2 54. CXR with persistent bilateral pleural effusions and improving but not resolved b/l infiltrates. --Supplement O2, target sat > 90%, wean as tolerated --Treat underlying conditions as below (renal failure, CHF, ?PNA given new fever this afternoon) 6/15: procal 0/24, elevated D-dimer 6/16: V/Q scan negative for PE, consistent with CHF --follow blood cultures --US-guided thoracentesis 6/17 AM - 400 mL fluid removed --Follow up pleural fluid studies --ongoing diuresis as below  Fevers of unknown cause, MSSA in 1 of 4 bottles Blood Culture -  --ID consulted --Started on Ancef, likely d/c once repeat blood cultures negative x at least 48 hours --see ID recs No clear source or symptoms of infection. Repeat cultures remain negative and pt has been afebrile since evening of 6/16.  Right Upper Extremity Edema - RUE venous doppler U/S pending.   AKI superimposed on CKD stage 4 -  Baseline Cr appears ~2.5.  Presented with Cr 4.0 -> 4.3. Renal U/S negative for  hydronephrosis / obstruction. --Nephrology following, appreciate input --Foley for I/O's per prior attending, d/c asap   Acute on chronic diastolic CHF - with hypoxia due to pleural effusions and pulmonary edema, elevated BNP.   Echo 6/15: EF 55-60%, grade II diastolic dysfunction, biatrial dilation On IV Lasix per nephrology. Net IO Since Admission: -4,865 mL [06/07/21 1247] --Diuretics per nephrology --Strict I/O's and daily weights --Continue metoprolol, Imdur --Hydralazine stopped --Diuresis per nephrology, Lasix IV>>PO on [id number]  Acute metabolic encephalopathy -Improved. Likely multifactorial due to hypoglycemia, hyperammonemia, hypoxia, ?infection.   --Treat underlying causes as above/below --Continue lactulose --Delirium precautions:     -Lights and TV off, minimize interruptions at night    -Blinds open and lights on during day    -Glasses/hearing aid with patient    -Frequent reorientation    -PT/OT when able    -Avoid sedation medications/[place] list medications   Hypoglycemia / Type 2 diabetes - improved. Most likely 2/2 Prandin in the setting of worsening renal failure vs. Less likely adrenal insufficiency.   CBG's in low 100's, some 70-80's --Monitor closely --Hold antihyperglycemic agents --Hypoglycemia protocol --Resume CBG's routine AC/HS as pt alert and eating well now  Hyponatremia - Resolved. Suspect multifactorial due to volume overload and ?adrenal insufficiency.  TSH normal. --AI under evaluation as below --Follow BMP daily  Hypokalemia - Resolved after replacment --Folllow BMP and replace as needed  Hyperammonemia ? Hepatic encephalopathy --Continue lactulose --Titrate lactulose dosing for 3+ soft BM's daily CT a/p on 05/12/21 with no liver or biliary abnormalities seen.   History  of treatment for adrenal insufficiency Pt follows with Dr. Loanne Drilling.   Previously on Solu-cortef, appears was d/c'd at last visit. Cortisol level on admission is  2.5 Prior attending discussed with Dr. Dwyane Dee, on call for endocrinology, recommended Cosyntropin stim test. This was not properly collected and was re-ordered for this AM. --ACTH levels and stim test pending - follow --Hold off starting steroid for now, concern for ?infection given fever 6/15 --Recommend endocrinology follow up outpatient   Hypothyroidism chronic, controlled. TSH normal, FT4 normal. --Continue levothyroxine   Anemia of chronic disease - baseline Hbg 7.8.  No active bleeding. --Follow CBC daily   HTN - chronic, stable.  Due to Bilateral RAS. --Continue metoprolol, amlodipine --Renal stopped hydralazine  --Currently on IV Lasix 80 mg BID per nephro --Consider intervention for RAS     Patient BMI: Body mass index is 19.19 kg/m.   DVT prophylaxis: heparin injection 5,000 Units Start: 06/01/21 0600   Diet:  Diet Orders (From admission, onward)     Start     Ordered   06/03/21 0947  Diet Carb Modified Fluid consistency: Thin; Room service appropriate? Yes; Fluid restriction: 1500 mL Fluid  Diet effective now       Question Answer Comment  Diet-HS Snack? Nothing   Calorie Level Medium 1600-2000   Fluid consistency: Thin   Room service appropriate? Yes   Fluid restriction: 1500 mL Fluid      06/03/21 0946              Code Status: Full Code   Brief Narrative / Hospital Course to Date:   60 y/o female with history of CKD stage 4, DM, bilateral RAS admitted with AMS related to hypoglycemia. She was noted to be short of breath and imaging showed bilateral pleural effusions, had worsening renal function.  Admitted to [facility] service with nephrology and pulmonology consulted.    Undergoing diuresis with IV Lasix.  z Hypoglycemia has improved.   Patient remains encephalopathic.   Lactulose started due to hyperammonemia potentially contributing to mental status changes.  Transferred to [facility] on 6/14 due to increased oxygen requirement and work of  breathing.  Remains on 10 L/min HFNC oxygen.  Pulmonology following.      Subjective 06/07/21    Pt awake sitting up in bed.  Reports feeling okay today.  Denies any acute complaints including F/C, N/V, CP, SOB.     Disposition Plan & Communication   Status is: Inpatient  Remains inpatient appropriate because:IV treatments appropriate due to intensity of illness or inability to take PO  Dispo: The patient is from: Home              Anticipated d/c is to: Home              Patient currently is not medically stable to d/c.   Difficult to place patient No   Family Communication: Mother was at bedside on round 6/17. None present today   Consults, Procedures, Significant Events   Consultants:  Nephrology PCCM  Procedures:     Antimicrobials:  Anti-infectives (From admission, onward)    Start     Dose/Rate Route Frequency Ordered Stop   06/04/21 2030  ceFAZolin (ANCEF) IVPB 2g/100 mL premix        2 g 200 mL/hr over 30 Minutes Intravenous Every 12 hours 06/04/21 1940           Micro    Objective   Vitals:   06/07/21 0300 06/07/21 0723 06/07/21  0800 06/07/21 1133  BP: (!) 133/49 (!) 125/45 (!) 127/42 (!) 122/51  Pulse: 65 65 60 (!) 56  Resp: '12 14  14  '$ Temp: 98.2 F (36.8 C) 98.3 F (36.8 C)  97.8 F (36.6 C)  TempSrc: Oral Oral  Oral  SpO2: 93% 93% 97% 98%  Weight: 50.7 kg       Intake/Output Summary (Last 24 hours) at 06/07/2021 1247 Last data filed at 06/07/2021 0800 Gross per 24 hour  Intake 880 ml  Output 500 ml  Net 380 ml   Filed Weights   06/05/21 0400 06/06/21 0300 06/07/21 0300  Weight: 46.2 kg 50.2 kg 50.7 kg    Physical Exam:  General exam: awake laying in bed, no acute distress Respiratory system: CTAB, no wheezes, on 1 L/min supplemental oxygen. Cardiovascular system: normal S1/S2, RRR Central nervous system: A&Ox3, grossly non-focal exam, normal speech GI: soft and non-tender Extremities: no peripheral edema, normal tone, moves  all  Labs   Data Reviewed: I have personally reviewed following labs and imaging studies  CBC: Recent Labs  Lab 06/01/21 0324 06/03/21 0204 06/04/21 0021 06/06/21 0536  WBC 7.1 4.5 7.1 5.3  NEUTROABS 4.8  --  5.3  --   HGB 7.6* 7.3* 7.9* 8.2*  HCT 25.0* 22.9* 24.5* 25.5*  MCV 99.2 93.5 92.5 91.1  PLT 185 170 172 [id number]   Basic Metabolic Panel: Recent Labs  Lab 06/03/21 0204 06/04/21 0021 06/05/21 0113 06/06/21 0536 06/07/21 0033  NA 132* 133* 135 135 135  K 3.8 3.5 2.9* 4.4 3.9  CL 96* 97* 96* 97* 93*  CO2 '25 26 29 29 31  '$ GLUCOSE 107* 112* 150* 95 135*  BUN 43* 41* 36* 34* 31*  CREATININE 4.54* 4.18* 3.97* 3.35* 2.97*  CALCIUM 8.8* 8.5* 8.4* 8.8* 9.1   GFR: Estimated Creatinine Clearance: 15.9 mL/min (A) (by C-G formula based on SCr of 2.97 mg/dL (H)). Liver Function Tests: Recent Labs  Lab 06/01/21 0145 06/04/21 1044  AST 27 51*  ALT 12 17  ALKPHOS 127* 88  BILITOT 0.9 1.4*  PROT 7.0 6.3*  ALBUMIN 3.4* 3.0*   No results for input(s): LIPASE, AMYLASE in the last 168 hours. Recent Labs  Lab 06/02/21 0957 06/03/21 0204 06/04/21 0021  AMMONIA 82* 19 44*   Coagulation Profile: Recent Labs  Lab 06/04/21 1044  INR 1.2   Cardiac Enzymes: No results for input(s): CKTOTAL, CKMB, CKMBINDEX, TROPONINI in the last 168 hours. BNP (last 3 results) No results for input(s): PROBNP in the last 8760 hours. HbA1C: No results for input(s): HGBA1C in the last 72 hours. CBG: Recent Labs  Lab 06/06/21 1203 06/06/21 1642 06/06/21 2050 06/07/21 0628 06/07/21 1136  GLUCAP 187* 168* 171* 92 138*   Lipid Profile: No results for input(s): CHOL, HDL, LDLCALC, TRIG, CHOLHDL, LDLDIRECT in the last 72 hours. Thyroid Function Tests: No results for input(s): TSH, T4TOTAL, FREET4, T3FREE, THYROIDAB in the last 72 hours.  Anemia Panel: No results for input(s): VITAMINB12, FOLATE, FERRITIN, TIBC, IRON, RETICCTPCT in the last 72 hours. Sepsis Labs: Recent Labs  Lab  06/01/21 0145 06/03/21 1549 06/03/21 1846  PROCALCITON  --  0.24  --   LATICACIDVEN 1.1 0.9 0.7    Recent Results (from the past 240 hour(s))  SARS CORONAVIRUS 2 (TAT 6-24 HRS) Nasopharyngeal Nasopharyngeal Swab     Status: None   Collection Time: 06/01/21  1:09 AM   Specimen: Nasopharyngeal Swab  Result Value Ref Range Status   SARS Coronavirus 2 NEGATIVE  NEGATIVE Final    Comment: (NOTE) SARS-CoV-2 target nucleic acids are NOT DETECTED.  The SARS-CoV-2 RNA is generally detectable in upper and lower respiratory specimens during the acute phase of infection. Negative results do not preclude SARS-CoV-2 infection, do not rule out co-infections with other pathogens, and should not be used as the sole basis for treatment or other patient management decisions. Negative results must be combined with clinical observations, patient history, and epidemiological information. The expected result is Negative.  Fact Sheet for Patients: SugarRoll.be  Fact Sheet for Healthcare Providers: https://www.woods-mathews.com/  This test is not yet approved or cleared by the [place] FDA and  has been authorized for detection and/or diagnosis of SARS-CoV-2 by FDA under an Emergency Use Authorization (EUA). This EUA will remain  in effect (meaning this test can be used) for the duration of the COVID-19 declaration under Se ction 564(b)(1) of the Act, 21 U.S.C. section 360bbb-3(b)(1), unless the authorization is terminated or revoked sooner.  Performed at [facility], [place] [address] [place], [place] [address]   Culture, blood (routine x 2)     Status: Abnormal   Collection Time: 06/03/21  3:49 PM   Specimen: BLOOD  Result Value Ref Range Status   Specimen Description BLOOD LEFT ANTECUBITAL  Final   Special Requests   Final    BOTTLES DRAWN AEROBIC AND ANAEROBIC Blood Culture adequate volume   Culture  Setup Time   Final    GRAM POSITIVE  COCCI IN CLUSTERS ANAEROBIC BOTTLE ONLY CRITICAL RESULT CALLED TO, READ BACK BY AND VERIFIED WITHDion Body PHARMD 1911 06/04/21 A BROWNING Performed at [facility], [place] [address] [place], [place] [address]    Culture STAPHYLOCOCCUS AUREUS (A)  Final   Report Status 06/06/2021 FINAL  Final   Organism ID, Bacteria STAPHYLOCOCCUS AUREUS  Final      Susceptibility   Staphylococcus aureus - MIC*    CIPROFLOXACIN <=0.5 SENSITIVE Sensitive     ERYTHROMYCIN <=0.25 SENSITIVE Sensitive     GENTAMICIN <=0.5 SENSITIVE Sensitive     OXACILLIN 0.5 SENSITIVE Sensitive     TETRACYCLINE <=1 SENSITIVE Sensitive     VANCOMYCIN 1 SENSITIVE Sensitive     TRIMETH/SULFA <=10 SENSITIVE Sensitive     CLINDAMYCIN <=0.25 SENSITIVE Sensitive     RIFAMPIN <=0.5 SENSITIVE Sensitive     Inducible Clindamycin NEGATIVE Sensitive     * STAPHYLOCOCCUS AUREUS  Blood Culture ID Panel (Reflexed)     Status: Abnormal   Collection Time: 06/03/21  3:49 PM  Result Value Ref Range Status   Enterococcus faecalis NOT DETECTED NOT DETECTED Final   Enterococcus Faecium NOT DETECTED NOT DETECTED Final   Listeria monocytogenes NOT DETECTED NOT DETECTED Final   Staphylococcus species DETECTED (A) NOT DETECTED Final    Comment: CRITICAL RESULT CALLED TO, READ BACK BY AND VERIFIED WITHDion Body PHARMD 1911 06/04/21 A BROWNING    Staphylococcus aureus (BCID) DETECTED (A) NOT DETECTED Final    Comment: CRITICAL RESULT CALLED TO, READ BACK BY AND VERIFIED WITHDion Body PHARMD 1911 06/04/21 A BROWNING    Staphylococcus epidermidis NOT DETECTED NOT DETECTED Final   Staphylococcus lugdunensis NOT DETECTED NOT DETECTED Final   Streptococcus species NOT DETECTED NOT DETECTED Final   Streptococcus agalactiae NOT DETECTED NOT DETECTED Final   Streptococcus pneumoniae NOT DETECTED NOT DETECTED Final   Streptococcus pyogenes NOT DETECTED NOT DETECTED Final   A.calcoaceticus-baumannii NOT DETECTED NOT DETECTED Final   Bacteroides  fragilis NOT DETECTED  NOT DETECTED Final   Enterobacterales NOT DETECTED NOT DETECTED Final   Enterobacter cloacae complex NOT DETECTED NOT DETECTED Final   Escherichia coli NOT DETECTED NOT DETECTED Final   Klebsiella aerogenes NOT DETECTED NOT DETECTED Final   Klebsiella oxytoca NOT DETECTED NOT DETECTED Final   Klebsiella pneumoniae NOT DETECTED NOT DETECTED Final   Proteus species NOT DETECTED NOT DETECTED Final   Salmonella species NOT DETECTED NOT DETECTED Final   Serratia marcescens NOT DETECTED NOT DETECTED Final   Haemophilus influenzae NOT DETECTED NOT DETECTED Final   Neisseria meningitidis NOT DETECTED NOT DETECTED Final   Pseudomonas aeruginosa NOT DETECTED NOT DETECTED Final   Stenotrophomonas maltophilia NOT DETECTED NOT DETECTED Final   Candida albicans NOT DETECTED NOT DETECTED Final   Candida auris NOT DETECTED NOT DETECTED Final   Candida glabrata NOT DETECTED NOT DETECTED Final   Candida krusei NOT DETECTED NOT DETECTED Final   Candida parapsilosis NOT DETECTED NOT DETECTED Final   Candida tropicalis NOT DETECTED NOT DETECTED Final   Cryptococcus neoformans/gattii NOT DETECTED NOT DETECTED Final   Meth resistant mecA/C and MREJ NOT DETECTED NOT DETECTED Final    Comment: Performed at [facility], [place] [address], [place], [place] [address]  Culture, blood (routine x 2)     Status: None (Preliminary result)   Collection Time: 06/03/21  4:04 PM   Specimen: BLOOD  Result Value Ref Range Status   Specimen Description BLOOD LEFT ANTECUBITAL  Final   Special Requests   Final    BOTTLES DRAWN AEROBIC AND ANAEROBIC Blood Culture adequate volume   Culture   Final    NO GROWTH 4 DAYS Performed at [facility], [place] [address], [place], [place] [address]    Report Status PENDING  Incomplete  Body fluid culture w Gram Stain     Status: None (Preliminary result)   Collection Time: 06/05/21 10:36 AM   Specimen: Lung, Right; Pleural Fluid  Result Value Ref Range  Status   Specimen Description PLEURAL FLUID  Final   Special Requests RIGHT LUNG  Final   Gram Stain   Final    MODERATE WBC PRESENT, PREDOMINANTLY MONONUCLEAR NO ORGANISMS SEEN    Culture   Final    NO GROWTH 2 DAYS Performed at [facility], [place] [address] [place], [place] [address]    Report Status PENDING  Incomplete  Culture, blood (routine x 2)     Status: None (Preliminary result)   Collection Time: 06/06/21  5:36 AM   Specimen: BLOOD  Result Value Ref Range Status   Specimen Description BLOOD RIGHT ANTECUBITAL  Final   Special Requests   Final    BOTTLES DRAWN AEROBIC ONLY Blood Culture adequate volume   Culture   Final    NO GROWTH 1 DAY Performed at [facility], [place] [address], [place],  [address]    Report Status PENDING  Incomplete  Culture, blood (routine x 2)     Status: None (Preliminary result)   Collection Time: 06/06/21  5:43 AM   Specimen: BLOOD RIGHT HAND  Result Value Ref Range Status   Specimen Description BLOOD RIGHT HAND  Final   Special Requests   Final    BOTTLES DRAWN AEROBIC AND ANAEROBIC Blood Culture results may not be optimal due to an inadequate volume of blood received in culture bottles   Culture   Final    NO GROWTH 1 DAY Performed at [facility], [address] [place],  [address]    Report Status PENDING  Incomplete      Imaging Studies   [place] RT UPPER EXTREM LTD SOFT TISSUE NON VASCULAR  Result Date: 06/05/2021 CLINICAL DATA:  Right wrist swelling following IV removal EXAM: ULTRASOUND RIGHT UPPER EXTREMITY LIMITED TECHNIQUE: Ultrasound examination of the upper extremity soft tissues was performed in the area of clinical concern. COMPARISON:  None. FINDINGS: Targeted ultrasound was performed of the soft tissues of the right wrist at site of patient's clinical concern. No soft tissue edema or fluid collection. IMPRESSION: No soft tissue edema or fluid collection of the right wrist at site of patient's  clinical concern. Electronically Signed   By: Davina Poke D.O.   On: 06/05/2021 16:53   ECHOCARDIOGRAM LIMITED  Result Date: 06/05/2021    ECHOCARDIOGRAM LIMITED REPORT   Patient Name:   ALVANIA BILLE Date of Exam: [id number] Medical Rec #:  [id number]      Height:       64.0 in Accession #:    [id number]     Weight:       101.9 lb Date of Birth:  06-22-1961     BSA:          1.469 m Patient Age:    76 years       BP:           117/45 mmHg Patient Gender: F              HR:           70 bpm. Exam Location:  Inpatient Procedure: Limited Echo and Color Doppler Indications:    Bacteremia  History:        Patient has prior history of Echocardiogram examinations, most                 recent 06/03/2021. CHF; Risk Factors:Diabetes and Hypertension.  Sonographer:    Merrie Roof RDCS Referring Phys: [id number] [place]  1. Limited echo to look again for possible endocarditis.  2. Left ventricular ejection fraction, by estimation, is 55 to 60%. The left ventricle has normal function.  3. The mitral valve is abnormal. Trivial mitral valve regurgitation. Moderate mitral annular calcification.  4. The aortic valve is normal in structure. Aortic valve regurgitation is not visualized. No aortic stenosis is present. FINDINGS  Left Ventricle: Left ventricular ejection fraction, by estimation, is 55 to 60%. The left ventricle has normal function. Mitral Valve: The mitral valve is abnormal. There is moderate thickening of the mitral valve leaflet(s). There is mild calcification of the mitral valve leaflet(s). Moderate mitral annular calcification. Trivial mitral valve regurgitation. Tricuspid Valve: The tricuspid valve is grossly normal. Tricuspid valve regurgitation is mild. There is no evidence of tricuspid valve vegetation. Aortic Valve: The aortic valve is normal in structure. Aortic valve regurgitation is not visualized. No aortic stenosis is present. There is no evidence of aortic valve vegetation.  Pulmonic Valve: The pulmonic valve was not well visualized. Pulmonic valve regurgitation is not visualized. Additional Comments: Limited echo to look again for possible endocarditis. TRICUSPID VALVE TR Peak grad:   33.6 mmHg TR Vmax:        290.00 cm/s Mertie Moores MD Electronically signed by Mertie Moores MD Signature Date/Time: 06/05/2021/3:49:28 PM    Final      Medications   Scheduled Meds:  amLODipine  5 mg Oral Daily   aspirin  81 mg Oral Daily   atorvastatin  20 mg Oral  Daily   calcium-vitamin D  2 tablet Oral Q breakfast   Chlorhexidine Gluconate Cloth  6 each Topical Daily   dextrose  1 ampule Intravenous Once   feeding supplement  237 mL Oral TID BM   fluticasone  2 spray Each Nare Daily   furosemide  80 mg Oral BID   heparin  5,000 Units Subcutaneous Q8H   isosorbide mononitrate  60 mg Oral Daily   lactulose  20 g Oral TID   levothyroxine  150 mcg Oral Q0600   metoprolol succinate  25 mg Oral Daily   multivitamin with minerals  1 tablet Oral Daily   pantoprazole  80 mg Oral Daily   polyethylene glycol  17 g Oral Daily   sodium chloride flush  3 mL Intravenous Q12H   vitamin B-12  1,000 mcg Oral Daily   Continuous Infusions:  sodium chloride      ceFAZolin (ANCEF) IV 2 g (06/07/21 0934)       LOS: 6 days    Time spent: 25 minutes with > 50% spent at bedside and in coordination of care.    Ezekiel Slocumb, DO [facility]  06/07/2021, 12:47 PM      If 7PM-7AM, please contact night-coverage. How to contact the [facility] Attending or Consulting provider [place] or covering provider during after hours [place], for this patient?    Check the care team in [facility] and look for a) attending/consulting [facility] provider listed and b) the [facility] team listed Log into [email or web address] and use [place]'s universal password to access. If you do not have the password, please contact the hospital operator. Locate the [facility] provider you are looking for under [facility] and page  to a number that you can be directly reached. If you still have difficulty reaching the provider, please page the [facility] (Director on Call) for the Hospitalists listed on amion for assistance.

## 2021-06-07 NOTE — Progress Notes (Signed)
Pharmacy Antibiotic Note  [name] is a 60 y.o. female admitted on 06/01/2021 with SOB, fatigue, and hypoglycemia. Pharmacy has been consulted for cefazolin dosing for MSSA bacteremia. Patient also with AKI on CKD Stage IV which is improving with diuresis. Repeat cultures pending.  Plan: Continue cefazolin 2g q12h  Monitor renal function, cultures, and clinical progression  Weight: 50.7 kg (111 lb 12.4 oz)  Temp (24hrs), Avg:98.2 F (36.8 C), Min:97.8 F (36.6 C), Max:98.6 F (37 C)  Recent Labs  Lab 06/01/21 0145 06/01/21 0324 06/02/21 0103 06/03/21 0204 06/03/21 1549 06/03/21 1846 06/04/21 0021 06/05/21 0113 06/06/21 0536 06/07/21 0033  WBC  --  7.1  --  4.5  --   --  7.1  --  5.3  --   CREATININE 4.06*  --    < > 4.54*  --   --  4.18* 3.97* 3.35* 2.97*  LATICACIDVEN 1.1  --   --   --  0.9 0.7  --   --   --   --    < > = values in this interval not displayed.     Estimated Creatinine Clearance: 15.9 mL/min (A) (by C-G formula based on SCr of 2.97 mg/dL (H)).    Allergies  Allergen Reactions   Beef-Derived Products     RELIGOUS  REASONS   Pork-Derived Products     RELIGIOUS REASONS   Percocet [Oxycodone-Acetaminophen] Rash    Antimicrobials this admission: Cefazolin 6/16 >>   Dose adjustments this admission: N/a  Microbiology results: 6/15 bcx: 1/4 MSSA  Thank you for allowing pharmacy to be a part of this patient's care.   Arrie Senate, PharmD, BCPS, [facility] Clinical Pharmacist [phone] Please check AMION for all [place] numbers 06/07/2021

## 2021-06-07 NOTE — Progress Notes (Signed)
[facility] Progress Note   Assessment/ Plan:    AKI on CKD IV: in the setting of hypoglyemic episode and CHF exacerbation on the background of RAS.  She will be very sensitive to even the smallest hypotensive episode in the setting of bilateral RAS.  Renal US without obstruction 06/01/21.   Plan: - fluid removal going well - stopped hydralazine - on Lasix 80 mg IV BID--> switch to PO 80 BID today 6/19 - previously intervention not pursued for RAS- consider, would be more of a long-term plan - ordered UA (negative) and UP/C- pending - no indication for dialysis - Cr improving and diuresis is going well - d/c Foley   2.  dCHF exacerbation: metoprolol, imdur, stopped hydralazine as above as well as increased Lasix   3.  Hypoglycemia: off all oral hypoglycemics, CBGs have been adequate.  Q 2 CBG per primary  4.  Acute hypoxic RF: presumably to pulm edema- increased diuresis as above  5.  History of adrenal insufficiency: s/p cosyntropin test per primary  6.  Possible HE: on lactulose   7.  MSSA bacteremia- had fever, blood cultures drawn on 6/15. Started on Ancef 6/15- s/p thoracentesis 6/17   8.  Dispo: pending.  Renal wise nothing else to add at this time.  Will sign off. Will need follow up with Dr Johnney Ou 3-4 weeks after discharged, will help arrange.  Subjective:    Feels better today.  Awake and alert, off O2.     Objective:   BP (!) 127/42   Pulse 60   Temp 98.3 F (36.8 C) (Oral)   Resp 14   Wt 50.7 kg   SpO2 97%   BMI 19.19 kg/m   Physical Exam: GEN: sitting in bed HEENT EOMI PERRL NECK +JVD much improved PULM very faint bilateral crackles CV RRR ABD soft, nontender EXT no LE edema  Labs: BMET Recent Labs  Lab 06/01/21 0145 06/02/21 0103 06/03/21 0204 06/04/21 0021 06/05/21 0113 06/06/21 0536 06/07/21 0033  NA 131* 129* 132* 133* 135 135 135  K 4.3 4.2 3.8 3.5 2.9* 4.4 3.9  CL 100 98 96* 97* 96* 97* 93*  CO2 21* '22 25 26 29 29 31   '$ GLUCOSE 54* 100* 107* 112* 150* 95 135*  BUN 43* 44* 43* 41* 36* 34* 31*  CREATININE 4.06* 4.33* 4.54* 4.18* 3.97* 3.35* 2.97*  CALCIUM 8.8* 8.7* 8.8* 8.5* 8.4* 8.8* 9.1   CBC Recent Labs  Lab 06/01/21 0324 06/03/21 0204 06/04/21 0021 06/06/21 0536  WBC 7.1 4.5 7.1 5.3  NEUTROABS 4.8  --  5.3  --   HGB 7.6* 7.3* 7.9* 8.2*  HCT 25.0* 22.9* 24.5* 25.5*  MCV 99.2 93.5 92.5 91.1  PLT 185 170 172 156      Medications:     amLODipine  5 mg Oral Daily   aspirin  81 mg Oral Daily   atorvastatin  20 mg Oral Daily   calcium-vitamin D  2 tablet Oral Q breakfast   Chlorhexidine Gluconate Cloth  6 each Topical Daily   dextrose  1 ampule Intravenous Once   feeding supplement  237 mL Oral TID BM   fluticasone  2 spray Each Nare Daily   furosemide  80 mg Oral BID   heparin  5,000 Units Subcutaneous Q8H   isosorbide mononitrate  60 mg Oral Daily   lactulose  20 g Oral TID   levothyroxine  150 mcg Oral Q0600   metoprolol succinate  25 mg Oral  Daily   multivitamin with minerals  1 tablet Oral Daily   pantoprazole  80 mg Oral Daily   polyethylene glycol  17 g Oral Daily   sodium chloride flush  3 mL Intravenous Q12H   vitamin B-12  1,000 mcg Oral Daily     Madelon Lips MD 06/07/2021, 8:35 AM

## 2021-06-08 ENCOUNTER — Ambulatory Visit: Payer: Medicaid Other | Admitting: Internal Medicine

## 2021-06-08 ENCOUNTER — Other Ambulatory Visit (HOSPITAL_COMMUNITY): Payer: Self-pay

## 2021-06-08 DIAGNOSIS — B9561 Methicillin susceptible Staphylococcus aureus infection as the cause of diseases classified elsewhere: Secondary | ICD-10-CM

## 2021-06-08 DIAGNOSIS — R7881 Bacteremia: Secondary | ICD-10-CM

## 2021-06-08 LAB — GLUCOSE, CAPILLARY
Glucose-Capillary: 81 mg/dL (ref 70–99)
Glucose-Capillary: 204 mg/dL — ABNORMAL HIGH (ref 70–99)
Glucose-Capillary: 93 mg/dL (ref 70–99)
Glucose-Capillary: 101 mg/dL — ABNORMAL HIGH (ref 70–99)

## 2021-06-08 LAB — BASIC METABOLIC PANEL
Anion gap: 10 (ref 5–15)
BUN: 32 mg/dL — ABNORMAL HIGH (ref 8–23)
CO2: 32 mmol/L (ref 22–32)
Calcium: 8.7 mg/dL — ABNORMAL LOW (ref 8.9–10.3)
Chloride: 93 mmol/L — ABNORMAL LOW (ref 98–111)
Creatinine, Ser: 2.71 mg/dL — ABNORMAL HIGH (ref 0.44–1.00)
GFR, Estimated: 19 mL/min — ABNORMAL LOW (ref >60)
Glucose, Bld: 78 mg/dL (ref 70–99)
Potassium: 3.9 mmol/L (ref 3.5–5.1)
Sodium: 135 mmol/L (ref 135–145)

## 2021-06-08 LAB — PATHOLOGIST SMEAR REVIEW

## 2021-06-08 LAB — CORTISOL-AM, BLOOD: Cortisol - AM: 1.6 ug/dL — ABNORMAL LOW (ref 6.7–22.6)

## 2021-06-08 LAB — BODY FLUID CULTURE W GRAM STAIN: Culture: NO GROWTH

## 2021-06-08 LAB — CULTURE, BLOOD (ROUTINE X 2)
Culture: NO GROWTH
Special Requests: ADEQUATE

## 2021-06-08 MED ORDER — LINEZOLID 600 MG PO TABS
600.0000 mg | ORAL_TABLET | Freq: Two times a day (BID) | ORAL | 0 refills | Status: AC
  Filled 2021-06-08: qty 14, 7d supply, fill #0

## 2021-06-08 NOTE — Progress Notes (Signed)
[facility]  Date of Admission:  06/01/2021      Total days of antibiotics 5  Cefazolin          ASSESSMENT: [name] is a 60 y.o. female admitted with hypoglycemia on 6/12. Developed fevers on hospital day 3 with MSSA growing in 1/4 bottles. TTE was repeated as her first study was difficult due to her compliance. There is some mitral valve thickening and calcifications and no evidence of vegetation. Cleared bacteremia quickly and overall low risk patient with no features complicating her treatment (no PPM, hardware, lines, etc). Would continue treatment with IV cefazolin for 2 more days then discharge on PO linezolid x 7 more days to complete 2 weeks of total treatment. We will see her back in ID clinic to ensure she is doing well and no further need for antibiotics at that time.    PLAN: Continue cefazolin for 2 more days  D/C on linezoid 600 mg BID x 7d.  FU arranged in ID clinic 7/7 @ 9:15 am with Janene Madeira, NP    Please call with questions or changes to patient's condition.     Principal Problem:   Bacteremia due to methicillin susceptible Staphylococcus aureus (MSSA) Active Problems:   CKD (chronic kidney disease) stage 4, GFR 15-29 ml/min (HCC)   HTN (hypertension)   Anemia due to chronic kidney disease   Adrenal insufficiency (Addison's disease) (HCC)   AKI (acute kidney injury) ([employer])   Acute on chronic diastolic CHF (congestive heart failure) (HCC)   DM2 (diabetes mellitus, type 2) (HCC)   CHF exacerbation (HCC)   Volume overload   Respiratory failure (HCC)   Protein-calorie malnutrition, severe    amLODipine  5 mg Oral Daily   aspirin  81 mg Oral Daily   atorvastatin  20 mg Oral Daily   calcium-vitamin D  2 tablet Oral Q breakfast   Chlorhexidine Gluconate Cloth  6 each Topical Daily   dextrose  1 ampule Intravenous Once   feeding supplement  237 mL Oral TID BM   fluticasone  2 spray Each Nare Daily    furosemide  80 mg Oral BID   heparin  5,000 Units Subcutaneous Q8H   isosorbide mononitrate  60 mg Oral Daily   lactulose  20 g Oral TID   levothyroxine  150 mcg Oral Q0600   metoprolol succinate  25 mg Oral Daily   multivitamin with minerals  1 tablet Oral Daily   pantoprazole  80 mg Oral Daily   polyethylene glycol  17 g Oral Daily   sodium chloride flush  3 mL Intravenous Q12H   vitamin B-12  1,000 mcg Oral Daily    SUBJECTIVE: She states she is cold. Feels better; most notably breathing better today after thoracentesis.   Review of Systems: Review of Systems  Constitutional:  Negative for chills and fever.  HENT:  Negative for tinnitus.   Eyes:  Negative for blurred vision and photophobia.  Respiratory:  Negative for cough and sputum production.   Cardiovascular:  Negative for chest pain.  Gastrointestinal:  Negative for diarrhea, nausea and vomiting.  Genitourinary:  Negative for dysuria.  Skin:  Negative for rash.  Neurological:  Negative for headaches.     Allergies  Allergen Reactions   Beef-Derived Products     RELIGOUS  REASONS   Pork-Derived Products     RELIGIOUS REASONS   Percocet [Oxycodone-Acetaminophen] Rash    OBJECTIVE: Vitals:  06/08/21 0607 06/08/21 0740 06/08/21 0942 06/08/21 1047  BP: (!) 128/47 (!) 129/47 (!) 132/45 (!) 131/47  Pulse: (!) 49 (!) 56 60 (!) 50  Resp: '18 15 15 14  '$ Temp:  98 F (36.7 C)  98.5 F (36.9 C)  TempSrc:  Oral  Oral  SpO2: 90% 94% 93% 94%  Weight:       Body mass index is 16.69 kg/m.  Physical Exam Vitals reviewed.  Constitutional:      Comments: Resting quietly in bed. No distress. No complaints aside from being cold.   HENT:     Mouth/Throat:     Mouth: Mucous membranes are moist.     Pharynx: Oropharynx is clear. No oropharyngeal exudate.  Eyes:     General: No scleral icterus.    Pupils: Pupils are equal, round, and reactive to light.  Cardiovascular:     Rate and Rhythm: Normal rate.     Heart  sounds: Murmur (soft systolic) heard.  Pulmonary:     Effort: Pulmonary effort is normal.     Breath sounds: Normal breath sounds.  Abdominal:     General: Bowel sounds are normal.     Palpations: Abdomen is soft.  Musculoskeletal:        General: Normal range of motion.  Skin:    General: Skin is warm and dry.     Capillary Refill: Capillary refill takes less than 2 seconds.     Comments: Small intact callous on the R plantar aspect near MT2 without any wounds or changes to toes.  Old PIV sites but none are painful.   Neurological:     General: No focal deficit present.     Mental Status: She is oriented to person, place, and time.    Lab Results Lab Results  Component Value Date   WBC 5.3 06/06/2021   HGB 8.2 (L) 06/06/2021   HCT 25.5 (L) 06/06/2021   MCV 91.1 06/06/2021   PLT 156 06/06/2021    Lab Results  Component Value Date   CREATININE 2.71 (H) 06/08/2021   BUN 32 (H) 06/08/2021   NA 135 06/08/2021   K 3.9 06/08/2021   CL 93 (L) 06/08/2021   CO2 32 06/08/2021    Lab Results  Component Value Date   ALT 17 06/04/2021   AST 51 (H) 06/04/2021   ALKPHOS 88 06/04/2021   BILITOT 1.4 (H) 06/04/2021     Microbiology: Recent Results (from the past 240 hour(s))  SARS CORONAVIRUS 2 (TAT 6-24 HRS) Nasopharyngeal Nasopharyngeal Swab     Status: None   Collection Time: 06/01/21  1:09 AM   Specimen: Nasopharyngeal Swab  Result Value Ref Range Status   SARS Coronavirus 2 NEGATIVE NEGATIVE Final    Comment: (NOTE) SARS-CoV-2 target nucleic acids are NOT DETECTED.  The SARS-CoV-2 RNA is generally detectable in upper and lower respiratory specimens during the acute phase of infection. Negative results do not preclude SARS-CoV-2 infection, do not rule out co-infections with other pathogens, and should not be used as the sole basis for treatment or other patient management decisions. Negative results must be combined with clinical observations, patient history, and  epidemiological information. The expected result is Negative.  Fact Sheet for Patients: SugarRoll.be  Fact Sheet for Healthcare Providers: https://www.woods-mathews.com/  This test is not yet approved or cleared by the [place] FDA and  has been authorized for detection and/or diagnosis of SARS-CoV-2 by FDA under an Emergency Use Authorization (EUA). This EUA will remain  in  effect (meaning this test can be used) for the duration of the COVID-19 declaration under Se ction 564(b)(1) of the Act, 21 U.S.C. section 360bbb-3(b)(1), unless the authorization is terminated or revoked sooner.  Performed at [facility], [place] [address], [place], [place] [address]   Culture, blood (routine x 2)     Status: Abnormal   Collection Time: 06/03/21  3:49 PM   Specimen: BLOOD  Result Value Ref Range Status   Specimen Description BLOOD LEFT ANTECUBITAL  Final   Special Requests   Final    BOTTLES DRAWN AEROBIC AND ANAEROBIC Blood Culture adequate volume   Culture  Setup Time   Final    GRAM POSITIVE COCCI IN CLUSTERS ANAEROBIC BOTTLE ONLY CRITICAL RESULT CALLED TO, READ BACK BY AND VERIFIED WITHDion Body PHARMD 1911 06/04/21 A BROWNING Performed at [facility], [place] [address] [place], [place] [address]    Culture STAPHYLOCOCCUS AUREUS (A)  Final   Report Status 06/06/2021 FINAL  Final   Organism ID, Bacteria STAPHYLOCOCCUS AUREUS  Final      Susceptibility   Staphylococcus aureus - MIC*    CIPROFLOXACIN <=0.5 SENSITIVE Sensitive     ERYTHROMYCIN <=0.25 SENSITIVE Sensitive     GENTAMICIN <=0.5 SENSITIVE Sensitive     OXACILLIN 0.5 SENSITIVE Sensitive     TETRACYCLINE <=1 SENSITIVE Sensitive     VANCOMYCIN 1 SENSITIVE Sensitive     TRIMETH/SULFA <=10 SENSITIVE Sensitive     CLINDAMYCIN <=0.25 SENSITIVE Sensitive     RIFAMPIN <=0.5 SENSITIVE Sensitive     Inducible Clindamycin NEGATIVE Sensitive     * STAPHYLOCOCCUS AUREUS  Blood  Culture ID Panel (Reflexed)     Status: Abnormal   Collection Time: 06/03/21  3:49 PM  Result Value Ref Range Status   Enterococcus faecalis NOT DETECTED NOT DETECTED Final   Enterococcus Faecium NOT DETECTED NOT DETECTED Final   Listeria monocytogenes NOT DETECTED NOT DETECTED Final   Staphylococcus species DETECTED (A) NOT DETECTED Final    Comment: CRITICAL RESULT CALLED TO, READ BACK BY AND VERIFIED WITHDion Body PHARMD 1911 06/04/21 A BROWNING    Staphylococcus aureus (BCID) DETECTED (A) NOT DETECTED Final    Comment: CRITICAL RESULT CALLED TO, READ BACK BY AND VERIFIED WITHDion Body PHARMD 1911 06/04/21 A BROWNING    Staphylococcus epidermidis NOT DETECTED NOT DETECTED Final   Staphylococcus lugdunensis NOT DETECTED NOT DETECTED Final   Streptococcus species NOT DETECTED NOT DETECTED Final   Streptococcus agalactiae NOT DETECTED NOT DETECTED Final   Streptococcus pneumoniae NOT DETECTED NOT DETECTED Final   Streptococcus pyogenes NOT DETECTED NOT DETECTED Final   A.calcoaceticus-baumannii NOT DETECTED NOT DETECTED Final   Bacteroides fragilis NOT DETECTED NOT DETECTED Final   Enterobacterales NOT DETECTED NOT DETECTED Final   Enterobacter cloacae complex NOT DETECTED NOT DETECTED Final   Escherichia coli NOT DETECTED NOT DETECTED Final   Klebsiella aerogenes NOT DETECTED NOT DETECTED Final   Klebsiella oxytoca NOT DETECTED NOT DETECTED Final   Klebsiella pneumoniae NOT DETECTED NOT DETECTED Final   Proteus species NOT DETECTED NOT DETECTED Final   Salmonella species NOT DETECTED NOT DETECTED Final   Serratia marcescens NOT DETECTED NOT DETECTED Final   Haemophilus influenzae NOT DETECTED NOT DETECTED Final   Neisseria meningitidis NOT DETECTED NOT DETECTED Final   Pseudomonas aeruginosa NOT DETECTED NOT DETECTED Final   Stenotrophomonas maltophilia NOT DETECTED NOT DETECTED Final   Candida albicans NOT DETECTED NOT DETECTED Final   Candida auris NOT DETECTED  NOT DETECTED  Final   Candida glabrata NOT DETECTED NOT DETECTED Final   Candida krusei NOT DETECTED NOT DETECTED Final   Candida parapsilosis NOT DETECTED NOT DETECTED Final   Candida tropicalis NOT DETECTED NOT DETECTED Final   Cryptococcus neoformans/gattii NOT DETECTED NOT DETECTED Final   Meth resistant mecA/C and MREJ NOT DETECTED NOT DETECTED Final    Comment: Performed at [facility] [address], [place], [place] [address]  Culture, blood (routine x 2)     Status: None   Collection Time: 06/03/21  4:04 PM   Specimen: BLOOD  Result Value Ref Range Status   Specimen Description BLOOD LEFT ANTECUBITAL  Final   Special Requests   Final    BOTTLES DRAWN AEROBIC AND ANAEROBIC Blood Culture adequate volume   Culture   Final    NO GROWTH 5 DAYS Performed at [facility], [place] [address], [place], [place] [address]    Report Status 06/08/2021 FINAL  Final  Body fluid culture w Gram Stain     Status: None (Preliminary result)   Collection Time: 06/05/21 10:36 AM   Specimen: Lung, Right; Pleural Fluid  Result Value Ref Range Status   Specimen Description PLEURAL FLUID  Final   Special Requests RIGHT LUNG  Final   Gram Stain   Final    MODERATE WBC PRESENT, PREDOMINANTLY MONONUCLEAR NO ORGANISMS SEEN    Culture   Final    NO GROWTH 3 DAYS Performed at [facility], [place] [address], [place], [place] [address]    Report Status PENDING  Incomplete  Culture, blood (routine x 2)     Status: None (Preliminary result)   Collection Time: 06/06/21  5:36 AM   Specimen: BLOOD  Result Value Ref Range Status   Specimen Description BLOOD RIGHT ANTECUBITAL  Final   Special Requests   Final    BOTTLES DRAWN AEROBIC ONLY Blood Culture adequate volume   Culture   Final    NO GROWTH 2 DAYS Performed at [facility], [place] [address], [place], [place] [address]    Report Status PENDING  Incomplete  Culture, blood (routine x 2)     Status: None (Preliminary result)   Collection  Time: 06/06/21  5:43 AM   Specimen: BLOOD RIGHT HAND  Result Value Ref Range Status   Specimen Description BLOOD RIGHT HAND  Final   Special Requests   Final    BOTTLES DRAWN AEROBIC AND ANAEROBIC Blood Culture results may not be optimal due to an inadequate volume of blood received in culture bottles   Culture   Final    NO GROWTH 2 DAYS Performed at [facility], [place] [address], [place], [place] [address]    Report Status PENDING  Incomplete   [name] is a 60 y.o. female

## 2021-06-08 NOTE — Progress Notes (Signed)
PROGRESS NOTE    [name]   [id number]  DOB: Sep 17, 1961  PCP: Ladell Pier, MD    DOA: 06/01/2021 LOS: 7   Assessment & Plan   Principal Problem:   Bacteremia due to methicillin susceptible Staphylococcus aureus (MSSA) Active Problems:   CKD (chronic kidney disease) stage 4, GFR 15-29 ml/min (HCC)   HTN (hypertension)   Anemia due to chronic kidney disease   Adrenal insufficiency (Addison's disease) (HCC)   AKI (acute kidney injury) ([place])   Acute on chronic diastolic CHF (congestive heart failure) (HCC)   DM2 (diabetes mellitus, type 2) (HCC)   CHF exacerbation (HCC)   Volume overload   Respiratory failure (HCC)   Protein-calorie malnutrition, severe   Acute respiratory failure with hypoxia due to pulmonary edema and bilateral pleural effusions -  Hypoxic in ED and started on 2L >> 6L, not on home O2. Further increased oxygen needs, on 10 L/min HFNC. ABG normal pCO2 and pH, hypoxemic w/ pO2 54. CXR with persistent bilateral pleural effusions and improving but not resolved b/l infiltrates. --Supplement O2, target sat > 90%, wean as tolerated --Treat underlying conditions as below (renal failure, CHF, ?PNA given new fever this afternoon) 6/15: procal 0/24, elevated D-dimer 6/16: V/Q scan negative for PE, consistent with CHF --follow repeat blood cultures - neg to date --US-guided thoracentesis 6/17 AM - 400 mL fluid removed --Diuresis as below  MSSA Bacteremia -  No clear source for bacteremia. Repeat cultures remain negative and pt has been afebrile since evening of 6/16. --ID consulted --Started on Ancef; treat for 2 week course --6/20 - cont Ancef 2 more days, D/c on linezolid 600 mg BID x 7 days --ID clinic follow up on 7/7 at 9:15 AM scheduled  Right Upper Extremity Edema - RUE venous doppler U/S Negative for VTE or fluid collection.   AKI superimposed on CKD stage 4 - Improving with diuresis.   Baseline Cr appears ~2.5.  Presented with Cr 4.0 ->  4.3. Renal U/S negative for hydronephrosis / obstruction. --Nephrology following, appreciate input --Foley for I/O's per prior attending, d/c asap   Acute on chronic diastolic CHF - improved, hypoxia resolved.   Presented with hypoxia due to pleural effusions and pulmonary edema, elevated BNP.   Echo 6/15: EF 55-60%, grade II diastolic dysfunction, biatrial dilation.   Net IO Since Admission: -5,210 mL [06/08/21 1817] --Diuretics per nephrology --On PO Lasix 80 mg BID --Strict I/O's and daily weights --Continue metoprolol, Imdur --Hydralazine stopped --Diuresis per nephrology, Lasix IV>>PO on [id number]  Acute metabolic encephalopathy -Improved. Likely multifactorial due to hypoglycemia, hyperammonemia, hypoxia, ?infection.   --Treat underlying causes as above/below --Continue lactulose --Delirium precautions:     -Lights and TV off, minimize interruptions at night    -Blinds open and lights on during day    -Glasses/hearing aid with patient    -Frequent reorientation    -PT/OT when able    -Avoid sedation medications   Hypoglycemia / Type 2 diabetes - improved. Most likely 2/2 Prandin in the setting of worsening renal failure vs. Less likely adrenal insufficiency.   CBG's in low 100's, some 70-80's --Monitor closely --Hold antihyperglycemic agents --Hypoglycemia protocol --CBG's  AC/HS   Hyponatremia - Resolved. Suspect multifactorial due to volume overload and ?adrenal insufficiency (see below).  TSH normal. --Follow BMP daily  Hypokalemia - Resolved after replacment --Folllow BMP and replace as needed  Hyperammonemia ? Hepatic encephalopathy --Continue lactulose --Titrate lactulose dosing for 3+ soft BM's daily CT a/p on 05/12/21 with  no liver or biliary abnormalities seen.   History of prior treatment for Adrenal Insufficiency Pt follows with Dr. Loanne Drilling.   Previously on Solu-cortef, appears was d/c'd at last visit. Cortisol level on admission is 2.5 Prior attending  discussed with Dr. Dwyane Dee, on call for endocrinology, recommended Cosyntropin stim test. --Holding off starting steroid in setting of infection --Stim-test results positive --Check aldosterone and renin-activity w/ ratio --BP and electrolytes improved, so doubt this was causing the hypoglycemia (more likely sulfonylurea use in severe AKI) --Recommend endocrinology follow up outpatient   Hypothyroidism chronic, controlled. TSH normal, FT4 normal. --Continue levothyroxine   Anemia of chronic disease - baseline Hbg 7.8.  No active bleeding. --Follow CBC daily   HTN - chronic, stable.  Due to Bilateral RAS. --Continue metoprolol, amlodipine --Renal stopped hydralazine  --Currently on IV Lasix 80 mg BID per nephro --Consider intervention for RAS     Patient BMI: Body mass index is 16.69 kg/m.   DVT prophylaxis: heparin injection 5,000 Units Start: 06/01/21 0600   Diet:  Diet Orders (From admission, onward)     Start     Ordered   06/03/21 0947  Diet Carb Modified Fluid consistency: Thin; Room service appropriate? Yes; Fluid restriction: 1500 mL Fluid  Diet effective now       Question Answer Comment  Diet-HS Snack? Nothing   Calorie Level Medium 1600-2000   Fluid consistency: Thin   Room service appropriate? Yes   Fluid restriction: 1500 mL Fluid      06/03/21 0946              Code Status: Full Code   Brief Narrative / Hospital Course to Date:   60 y/o female with history of CKD stage 4, DM, bilateral RAS admitted with AMS related to hypoglycemia. She was noted to be short of breath and imaging showed bilateral pleural effusions, had worsening renal function.  Admitted to [facility] service with nephrology and pulmonology consulted.    Undergoing diuresis with IV Lasix.  z Hypoglycemia has improved.   Patient remains encephalopathic.   Lactulose started due to hyperammonemia potentially contributing to mental status changes.  Transferred to [facility] on 6/14 due to  increased oxygen requirement and work of breathing.  Remains on 10 L/min HFNC oxygen.  Pulmonology following.      Subjective 06/08/21    Pt sleeping but woke easily to voice.  Reports she feels well.  No acute complaints.  Says she is just tired.   Disposition Plan & Communication   Status is: Inpatient  Remains inpatient appropriate because:IV treatments appropriate due to intensity of illness or inability to take PO  Dispo: The patient is from: Home              Anticipated d/c is to: Home              Patient currently is not medically stable to d/c.   Difficult to place patient No   Family Communication: Mother was at bedside on round 6/17. None present today   Consults, Procedures, Significant Events   Consultants:  Nephrology PCCM  Procedures:  none   Antimicrobials:  Anti-infectives (From admission, onward)    Start     Dose/Rate Route Frequency Ordered Stop   06/11/21 0000  linezolid (ZYVOX) 600 MG tablet       Note to Pharmacy: Patient not discharging till later in the week but uninsured so wanted to send early   600 mg Oral 2 times daily 06/08/21  1432 06/18/21 2359   06/04/21 2030  ceFAZolin (ANCEF) IVPB 2g/100 mL premix        2 g 200 mL/hr over 30 Minutes Intravenous Every 12 hours 06/04/21 1940           Micro    Objective   Vitals:   06/08/21 0740 06/08/21 0942 06/08/21 1047 06/08/21 1200  BP: (!) 129/47 (!) 132/45 (!) 131/47 (!) 120/50  Pulse: (!) 56 60 (!) 50 (!) 51  Resp: '15 15 14 15  '$ Temp: 98 F (36.7 C)  98.5 F (36.9 C)   TempSrc: Oral  Oral   SpO2: 94% 93% 94% 96%  Weight:        Intake/Output Summary (Last 24 hours) at 06/08/2021 1817 Last data filed at 06/08/2021 0610 Gross per 24 hour  Intake 200 ml  Output 225 ml  Net -25 ml   Filed Weights   06/06/21 0300 06/07/21 0300 06/08/21 0400  Weight: 50.2 kg 50.7 kg 44.1 kg    Physical Exam:  General exam: sleeping, woke to voice, no acute distress Respiratory system:  CTAB, no wheezes or rhonchi, normal respiratory effort. Cardiovascular system: normal S1/S2, RRR Central nervous system: A&Ox3, grossly non-focal exam, normal speech Extremities: no peripheral edema, normal tone  Labs   Data Reviewed: I have personally reviewed following labs and imaging studies  CBC: Recent Labs  Lab 06/03/21 0204 06/04/21 0021 06/06/21 0536  WBC 4.5 7.1 5.3  NEUTROABS  --  5.3  --   HGB 7.3* 7.9* 8.2*  HCT 22.9* 24.5* 25.5*  MCV 93.5 92.5 91.1  PLT 170 172 [id number]   Basic Metabolic Panel: Recent Labs  Lab 06/04/21 0021 06/05/21 0113 06/06/21 0536 06/07/21 0033 06/08/21 0037  NA 133* 135 135 135 135  K 3.5 2.9* 4.4 3.9 3.9  CL 97* 96* 97* 93* 93*  CO2 '26 29 29 31 '$ 32  GLUCOSE 112* 150* 95 135* 78  BUN 41* 36* 34* 31* 32*  CREATININE 4.18* 3.97* 3.35* 2.97* 2.71*  CALCIUM 8.5* 8.4* 8.8* 9.1 8.7*   GFR: Estimated Creatinine Clearance: 15.2 mL/min (A) (by C-G formula based on SCr of 2.71 mg/dL (H)). Liver Function Tests: Recent Labs  Lab 06/04/21 1044  AST 51*  ALT 17  ALKPHOS 88  BILITOT 1.4*  PROT 6.3*  ALBUMIN 3.0*   No results for input(s): LIPASE, AMYLASE in the last 168 hours. Recent Labs  Lab 06/02/21 0957 06/03/21 0204 06/04/21 0021  AMMONIA 82* 19 44*   Coagulation Profile: Recent Labs  Lab 06/04/21 1044  INR 1.2   Cardiac Enzymes: No results for input(s): CKTOTAL, CKMB, CKMBINDEX, TROPONINI in the last 168 hours. BNP (last 3 results) No results for input(s): PROBNP in the last 8760 hours. HbA1C: No results for input(s): HGBA1C in the last 72 hours. CBG: Recent Labs  Lab 06/07/21 1529 06/07/21 2102 06/08/21 0620 06/08/21 1052 06/08/21 1658  GLUCAP 103* 148* 81 204* 93   Lipid Profile: No results for input(s): CHOL, HDL, LDLCALC, TRIG, CHOLHDL, LDLDIRECT in the last 72 hours. Thyroid Function Tests: No results for input(s): TSH, T4TOTAL, FREET4, T3FREE, THYROIDAB in the last 72 hours.  Anemia Panel: No results  for input(s): VITAMINB12, FOLATE, FERRITIN, TIBC, IRON, RETICCTPCT in the last 72 hours. Sepsis Labs: Recent Labs  Lab 06/03/21 1549 06/03/21 1846  PROCALCITON 0.24  --   LATICACIDVEN 0.9 0.7    Recent Results (from the past 240 hour(s))  SARS CORONAVIRUS 2 (TAT 6-24 HRS) Nasopharyngeal Nasopharyngeal Swab  Status: None   Collection Time: 06/01/21  1:09 AM   Specimen: Nasopharyngeal Swab  Result Value Ref Range Status   SARS Coronavirus 2 NEGATIVE NEGATIVE Final    Comment: (NOTE) SARS-CoV-2 target nucleic acids are NOT DETECTED.  The SARS-CoV-2 RNA is generally detectable in upper and lower respiratory specimens during the acute phase of infection. Negative results do not preclude SARS-CoV-2 infection, do not rule out co-infections with other pathogens, and should not be used as the sole basis for treatment or other patient management decisions. Negative results must be combined with clinical observations, patient history, and epidemiological information. The expected result is Negative.  Fact Sheet for Patients: SugarRoll.be  Fact Sheet for Healthcare Providers: https://www.woods-mathews.com/  This test is not yet approved or cleared by the [place] FDA and  has been authorized for detection and/or diagnosis of SARS-CoV-2 by FDA under an Emergency Use Authorization (EUA). This EUA will remain  in effect (meaning this test can be used) for the duration of the COVID-19 declaration under Se ction 564(b)(1) of the Act, 21 U.S.C. section 360bbb-3(b)(1), unless the authorization is terminated or revoked sooner.  Performed at [facility], [place] [address], [place], [place] [address]   Culture, blood (routine x 2)     Status: Abnormal   Collection Time: 06/03/21  3:49 PM   Specimen: BLOOD  Result Value Ref Range Status   Specimen Description BLOOD LEFT ANTECUBITAL  Final   Special Requests   Final    BOTTLES DRAWN  AEROBIC AND ANAEROBIC Blood Culture adequate volume   Culture  Setup Time   Final    GRAM POSITIVE COCCI IN CLUSTERS ANAEROBIC BOTTLE ONLY CRITICAL RESULT CALLED TO, READ BACK BY AND VERIFIED WITHDion Body PHARMD 1911 06/04/21 A BROWNING Performed at [facility], [place] [address] [place], [place] [address]    Culture STAPHYLOCOCCUS AUREUS (A)  Final   Report Status 06/06/2021 FINAL  Final   Organism ID, Bacteria STAPHYLOCOCCUS AUREUS  Final      Susceptibility   Staphylococcus aureus - MIC*    CIPROFLOXACIN <=0.5 SENSITIVE Sensitive     ERYTHROMYCIN <=0.25 SENSITIVE Sensitive     GENTAMICIN <=0.5 SENSITIVE Sensitive     OXACILLIN 0.5 SENSITIVE Sensitive     TETRACYCLINE <=1 SENSITIVE Sensitive     VANCOMYCIN 1 SENSITIVE Sensitive     TRIMETH/SULFA <=10 SENSITIVE Sensitive     CLINDAMYCIN <=0.25 SENSITIVE Sensitive     RIFAMPIN <=0.5 SENSITIVE Sensitive     Inducible Clindamycin NEGATIVE Sensitive     * STAPHYLOCOCCUS AUREUS  Blood Culture ID Panel (Reflexed)     Status: Abnormal   Collection Time: 06/03/21  3:49 PM  Result Value Ref Range Status   Enterococcus faecalis NOT DETECTED NOT DETECTED Final   Enterococcus Faecium NOT DETECTED NOT DETECTED Final   Listeria monocytogenes NOT DETECTED NOT DETECTED Final   Staphylococcus species DETECTED (A) NOT DETECTED Final    Comment: CRITICAL RESULT CALLED TO, READ BACK BY AND VERIFIED WITHDion Body PHARMD 1911 06/04/21 A BROWNING    Staphylococcus aureus (BCID) DETECTED (A) NOT DETECTED Final    Comment: CRITICAL RESULT CALLED TO, READ BACK BY AND VERIFIED WITHDion Body PHARMD 1911 06/04/21 A BROWNING    Staphylococcus epidermidis NOT DETECTED NOT DETECTED Final   Staphylococcus lugdunensis NOT DETECTED NOT DETECTED Final   Streptococcus species NOT DETECTED NOT DETECTED Final   Streptococcus agalactiae NOT DETECTED NOT DETECTED Final   Streptococcus pneumoniae NOT DETECTED  NOT DETECTED Final   Streptococcus pyogenes NOT  DETECTED NOT DETECTED Final   A.calcoaceticus-baumannii NOT DETECTED NOT DETECTED Final   Bacteroides fragilis NOT DETECTED NOT DETECTED Final   Enterobacterales NOT DETECTED NOT DETECTED Final   Enterobacter cloacae complex NOT DETECTED NOT DETECTED Final   Escherichia coli NOT DETECTED NOT DETECTED Final   Klebsiella aerogenes NOT DETECTED NOT DETECTED Final   Klebsiella oxytoca NOT DETECTED NOT DETECTED Final   Klebsiella pneumoniae NOT DETECTED NOT DETECTED Final   Proteus species NOT DETECTED NOT DETECTED Final   Salmonella species NOT DETECTED NOT DETECTED Final   Serratia marcescens NOT DETECTED NOT DETECTED Final   Haemophilus influenzae NOT DETECTED NOT DETECTED Final   Neisseria meningitidis NOT DETECTED NOT DETECTED Final   Pseudomonas aeruginosa NOT DETECTED NOT DETECTED Final   Stenotrophomonas maltophilia NOT DETECTED NOT DETECTED Final   Candida albicans NOT DETECTED NOT DETECTED Final   Candida auris NOT DETECTED NOT DETECTED Final   Candida glabrata NOT DETECTED NOT DETECTED Final   Candida krusei NOT DETECTED NOT DETECTED Final   Candida parapsilosis NOT DETECTED NOT DETECTED Final   Candida tropicalis NOT DETECTED NOT DETECTED Final   Cryptococcus neoformans/gattii NOT DETECTED NOT DETECTED Final   Meth resistant mecA/C and MREJ NOT DETECTED NOT DETECTED Final    Comment: Performed at [facility], [address], [place], [place] [address]  Culture, blood (routine x 2)     Status: None   Collection Time: 06/03/21  4:04 PM   Specimen: BLOOD  Result Value Ref Range Status   Specimen Description BLOOD LEFT ANTECUBITAL  Final   Special Requests   Final    BOTTLES DRAWN AEROBIC AND ANAEROBIC Blood Culture adequate volume   Culture   Final    NO GROWTH 5 DAYS Performed at [facility], [place] [address], [place], [place] [address]    Report Status 06/08/2021 FINAL  Final  Body fluid culture w Gram Stain     Status: None   Collection Time: 06/05/21 10:36 AM    Specimen: Lung, Right; Pleural Fluid  Result Value Ref Range Status   Specimen Description PLEURAL FLUID  Final   Special Requests RIGHT LUNG  Final   Gram Stain   Final    MODERATE WBC PRESENT, PREDOMINANTLY MONONUCLEAR NO ORGANISMS SEEN    Culture   Final    NO GROWTH 3 DAYS Performed at [facility], [place] [address], [place], [place] [address]    Report Status 06/08/2021 FINAL  Final  Culture, blood (routine x 2)     Status: None (Preliminary result)   Collection Time: 06/06/21  5:36 AM   Specimen: BLOOD  Result Value Ref Range Status   Specimen Description BLOOD RIGHT ANTECUBITAL  Final   Special Requests   Final    BOTTLES DRAWN AEROBIC ONLY Blood Culture adequate volume   Culture   Final    NO GROWTH 2 DAYS Performed at [facility], [place] [address], [place], [place] [address]    Report Status PENDING  Incomplete  Culture, blood (routine x 2)     Status: None (Preliminary result)   Collection Time: 06/06/21  5:43 AM   Specimen: BLOOD RIGHT HAND  Result Value Ref Range Status   Specimen Description BLOOD RIGHT HAND  Final   Special Requests   Final    BOTTLES DRAWN AEROBIC AND ANAEROBIC Blood Culture results may not be optimal due to an inadequate volume of  blood received in culture bottles   Culture   Final    NO GROWTH 2 DAYS Performed at [facility], [place] [address] [place], [place] [address]    Report Status PENDING  Incomplete      Imaging Studies   No results found.   Medications   Scheduled Meds:  amLODipine  5 mg Oral Daily   aspirin  81 mg Oral Daily   atorvastatin  20 mg Oral Daily   calcium-vitamin D  2 tablet Oral Q breakfast   Chlorhexidine Gluconate Cloth  6 each Topical Daily   dextrose  1 ampule Intravenous Once   feeding supplement  237 mL Oral TID BM   fluticasone  2 spray Each Nare Daily   furosemide  80 mg Oral BID   heparin  5,000 Units Subcutaneous Q8H   isosorbide mononitrate  60 mg Oral Daily   lactulose  20 g  Oral TID   levothyroxine  150 mcg Oral Q0600   metoprolol succinate  25 mg Oral Daily   multivitamin with minerals  1 tablet Oral Daily   pantoprazole  80 mg Oral Daily   polyethylene glycol  17 g Oral Daily   sodium chloride flush  3 mL Intravenous Q12H   vitamin B-12  1,000 mcg Oral Daily   Continuous Infusions:  sodium chloride      ceFAZolin (ANCEF) IV 2 g (06/08/21 1144)       LOS: 7 days    Time spent: 25 minutes with > 50% spent at bedside and in coordination of care.    Ezekiel Slocumb, DO [facility]  06/08/2021, 6:17 PM      If 7PM-7AM, please contact night-coverage. How to contact the [facility] Attending or Consulting provider [place] or covering provider during after hours [place], for this patient?    Check the care team in [facility] and look for a) attending/consulting [facility] provider listed and b) the [facility] team listed Log into [email or web address] and use [place]'s universal password to access. If you do not have the password, please contact the hospital operator. Locate the [facility] provider you are looking for under [facility] and page to a number that you can be directly reached. If you still have difficulty reaching the provider, please page the [facility] (Director on Call) for the Hospitalists listed on amion for assistance.

## 2021-06-08 NOTE — Progress Notes (Signed)
Mobility Specialist - Progress Note   06/08/21 1131  Mobility  Activity Ambulated in hall  Level of Assistance Contact guard assist, steadying assist  Assistive Device Front wheel walker  Distance Ambulated (ft) 80 ft  Mobility Ambulated with assistance in hallway  Mobility Response Tolerated well  Mobility performed by Mobility specialist  $Mobility charge 1 Mobility   Pt lost balance laterally twice during ambulation and required assist steadying herself. She stated she felt she was still waking up from her nap. Pt then assist back into bed after walk, call bell at side and family member in room. VSS throughout.   Pricilla Handler Mobility Specialist Mobility Specialist Phone: [phone]

## 2021-06-09 LAB — CBC
HCT: 25.1 % — ABNORMAL LOW (ref 36.0–46.0)
Hemoglobin: 8.2 g/dL — ABNORMAL LOW (ref 12.0–15.0)
MCH: 28.8 pg (ref 26.0–34.0)
MCHC: 32.7 g/dL (ref 30.0–36.0)
MCV: 88.1 fL (ref 80.0–100.0)
Platelets: 187 10*3/uL (ref 150–400)
RBC: 2.85 MIL/uL — ABNORMAL LOW (ref 3.87–5.11)
RDW: 13.3 % (ref 11.5–15.5)
WBC: 4.3 10*3/uL (ref 4.0–10.5)
nRBC: 0 % (ref 0.0–0.2)

## 2021-06-09 LAB — GLUCOSE, CAPILLARY
Glucose-Capillary: 80 mg/dL (ref 70–99)
Glucose-Capillary: 193 mg/dL — ABNORMAL HIGH (ref 70–99)
Glucose-Capillary: 159 mg/dL — ABNORMAL HIGH (ref 70–99)
Glucose-Capillary: 227 mg/dL — ABNORMAL HIGH (ref 70–99)

## 2021-06-09 LAB — BASIC METABOLIC PANEL
Anion gap: 9 (ref 5–15)
BUN: 30 mg/dL — ABNORMAL HIGH (ref 8–23)
CO2: 32 mmol/L (ref 22–32)
Calcium: 8.7 mg/dL — ABNORMAL LOW (ref 8.9–10.3)
Chloride: 92 mmol/L — ABNORMAL LOW (ref 98–111)
Creatinine, Ser: 2.65 mg/dL — ABNORMAL HIGH (ref 0.44–1.00)
GFR, Estimated: 20 mL/min — ABNORMAL LOW (ref >60)
Glucose, Bld: 90 mg/dL (ref 70–99)
Potassium: 3.7 mmol/L (ref 3.5–5.1)
Sodium: 133 mmol/L — ABNORMAL LOW (ref 135–145)

## 2021-06-09 LAB — AMMONIA: Ammonia: 23 umol/L (ref 9–35)

## 2021-06-09 MED ORDER — HYDROCORTISONE 10 MG PO TABS
10.0000 mg | ORAL_TABLET | Freq: Two times a day (BID) | ORAL | Status: DC
  Administered 2021-06-09 – 2021-06-11 (×5): 10 mg via ORAL
  Filled 2021-06-09 (×6): qty 1

## 2021-06-09 NOTE — Progress Notes (Signed)
Pt refused Ensure(s), Lactulose, Flonase.

## 2021-06-09 NOTE — Progress Notes (Signed)
Mobility Specialist: Progress Note   06/09/21 1522  Mobility  Activity Ambulated to bathroom;Ambulated in hall  Level of Assistance Contact guard assist, steadying assist  [place] wheel walker  Distance Ambulated (ft) 130 ft  Mobility Ambulated with assistance in hallway  Mobility Response Tolerated well  Mobility performed by Mobility specialist  $Mobility charge 1 Mobility   Pre-Mobility: 66 HR, 99% SpO2 Post-Mobility: 57 HR, 141/41 BP, 94% SpO2  Pt requesting to use BR and then agreeable to ambulate. Pt was min guard during ambulation, no LOB today. Pt to BR after returning to room, RN present in the room.   [facility] Atisha Hamidi Mobility Specialist Mobility Specialist Phone: [phone]

## 2021-06-09 NOTE — Progress Notes (Signed)
Pt heart rate in 50s. Arbutus Ped, DO notified. Metoprolol held per her instructions.

## 2021-06-09 NOTE — Progress Notes (Addendum)
PROGRESS NOTE    [name]   [id number]  DOB: [birth year]  PCP: Ladell Pier, MD    DOA: 06/01/2021 LOS: 8   Assessment & Plan   Principal Problem:   Bacteremia due to methicillin susceptible Staphylococcus aureus (MSSA) Active Problems:   CKD (chronic kidney disease) stage 4, GFR 15-29 ml/min (HCC)   HTN (hypertension)   Anemia due to chronic kidney disease   Adrenal insufficiency (Addison's disease) (HCC)   AKI (acute kidney injury) ([place])   Acute on chronic diastolic CHF (congestive heart failure) (HCC)   DM2 (diabetes mellitus, type 2) (HCC)   CHF exacerbation (HCC)   Volume overload   Respiratory failure (HCC)   Protein-calorie malnutrition, severe   PM Addendum - d/c Toprol given bradycardia and very soft DBP's   Acute respiratory failure with hypoxia due to pulmonary edema and bilateral pleural effusions -  Hypoxic in ED and started on 2L >> 6L, not on home O2. Further increased oxygen needs, on 10 L/min HFNC. ABG normal pCO2 and pH, hypoxemic w/ pO2 54. CXR with persistent bilateral pleural effusions and improving but not resolved b/l infiltrates. --Supplement O2, target sat > 90%, wean as tolerated --Treat underlying conditions as below (renal failure, CHF, ?PNA given new fever this afternoon) 6/16: V/Q scan negative for PE, consistent with CHF --follow repeat blood cultures - neg to date --US-guided thoracentesis 6/17 AM - 400 mL fluid removed --Diuresis as below  MSSA Bacteremia -  No clear source for bacteremia. Repeat cultures remain negative and pt has been afebrile since evening of 6/16. --ID consulted --Started on Ancef; treat for 2 week course --6/20 - cont Ancef 2 more days, D/c on linezolid 600 mg BID x 7 days --ID clinic follow up on 7/7 at 9:15 AM scheduled  Right Upper Extremity Edema - RUE venous doppler U/S Negative for VTE or fluid collection.   AKI superimposed on CKD stage 4 - Improving with diuresis.   Baseline Cr appears  ~2.5.  Presented with Cr 4.0 -> 4.3. Renal U/S negative for hydronephrosis / obstruction. --Nephrology following, appreciate input --Foley for I/O's per prior attending, d/c asap   Acute on chronic diastolic CHF - improved, hypoxia resolved.   Presented with hypoxia due to pleural effusions and pulmonary edema, elevated BNP.   Echo 6/15: EF 55-60%, grade II diastolic dysfunction, biatrial dilation.   Net IO Since Admission: -5,470 mL [06/09/21 1639] --Diuretics per nephrology --On PO Lasix 80 mg BID --Strict I/O's and daily weights --Continue metoprolol, Imdur --Hydralazine stopped --Diuresis per nephrology, Lasix IV>>PO on [id number]  Acute metabolic encephalopathy - Resolved. Likely multifactorial due to hypoglycemia, hyperammonemia, hypoxia, ?infection.   --Treat underlying causes as above/below --Continue lactulose --Delirium precautions:     -Lights and TV off, minimize interruptions at night    -Blinds open and lights on during day    -Glasses/hearing aid with patient    -Frequent reorientation    -PT/OT when able    -Avoid sedation medications   Hypoglycemia / Type 2 diabetes - improved. Most likely 2/2 Prandin in the setting of worsening renal failure vs. Less likely adrenal insufficiency.   CBG's in low 100's, some 70-80's --Monitor closely --Hold antihyperglycemic agents --Hypoglycemia protocol --CBG's  AC/HS   Hyponatremia - Resolved. Suspect multifactorial due to volume overload and ?adrenal insufficiency (see below).  TSH normal. --Follow BMP daily  Hypokalemia - Resolved after replacment --Folllow BMP and replace as needed  Hyperammonemia ? Hepatic encephalopathy --Continue lactulose --Titrate lactulose dosing for  3+ soft BM's daily CT a/p on 05/12/21 with no liver or biliary abnormalities seen.   History of prior treatment for Adrenal Insufficiency Pt follows with Dr. Loanne Drilling.   Previously on Solu-cortef, appears was d/c'd at last visit. Cortisol level on  admission is 2.5 Prior attending discussed with Dr. Dwyane Dee, on call for endocrinology, recommended Cosyntropin stim test. --Started on Solu-Cortef 10 mg BID --Stim-test results positive --Follow up pending aldosterone and renin-activity w/ ratio --Close follow up with Dr. Loanne Drilling after d/c   Hypothyroidism chronic, controlled. TSH normal, FT4 normal. --Continue levothyroxine   Anemia of chronic disease - baseline Hbg 7.8.  No active bleeding. --Follow CBC daily   HTN - chronic, stable.  Due to Bilateral RAS. --Continue metoprolol, amlodipine --Renal stopped hydralazine  --PO Lasix 80 mg BID per nephro --Consider intervention for RAS     Patient BMI: Body mass index is 16.8 kg/m.   DVT prophylaxis: heparin injection 5,000 Units Start: 06/01/21 0600   Diet:  Diet Orders (From admission, onward)     Start     Ordered   06/03/21 0947  Diet Carb Modified Fluid consistency: Thin; Room service appropriate? Yes; Fluid restriction: 1500 mL Fluid  Diet effective now       Question Answer Comment  Diet-HS Snack? Nothing   Calorie Level Medium 1600-2000   Fluid consistency: Thin   Room service appropriate? Yes   Fluid restriction: 1500 mL Fluid      06/03/21 0946              Code Status: Full Code   Brief Narrative / Hospital Course to Date:   60 y/o female with history of CKD stage 4, DM, bilateral RAS admitted with AMS related to hypoglycemia. She was noted to be short of breath and imaging showed bilateral pleural effusions, had worsening renal function.  Admitted to [facility] service with nephrology and pulmonology consulted.    Diuresed with IV Lasix under nephrology's management. Renal function slowly improved.   Hypoglycemia has resolved.    Lactulose started due to hyperammonemia potentially contributing to mental status changes.  Transferred to [facility] on 6/14 due to increased oxygen requirement and work of breathing.  She required on 10 L/min HFNC oxygen but  was able to be weaned off oxygen and has been stable on room air.  Patient mental status improved, she has been fully oriented but is often tired and sleeping.    Blood culture positive for MSSA (single bottle) and was having fevers but no other infectious symptoms.  ID consulted and patient on Ancef.        Subjective 06/09/21    Pt sleeping very soundly this AM. She wakes up and says she's very tired, asks to let her sleep.  She denies pain, SOB, N/V or other complaints. Says feeling okay today.   Disposition Plan & Communication   Status is: Inpatient  Remains inpatient appropriate because:IV treatments appropriate due to intensity of illness or inability to take PO  Dispo: The patient is from: Home              Anticipated d/c is to: Home              Patient currently is not medically stable to d/c.   Difficult to place patient No   Family Communication: Mother was at bedside on round 6/17. None present today   Consults, Procedures, Significant Events   Consultants:  Nephrology PCCM  Procedures:  none   Antimicrobials:  Anti-infectives (From admission, onward)    Start     Dose/Rate Route Frequency Ordered Stop   06/11/21 0000  linezolid (ZYVOX) 600 MG tablet       Note to Pharmacy: Patient not discharging till later in the week but uninsured so wanted to send early   600 mg Oral 2 times daily 06/08/21 1432 06/18/21 2359   06/04/21 2030  ceFAZolin (ANCEF) IVPB 2g/100 mL premix        2 g 200 mL/hr over 30 Minutes Intravenous Every 12 hours 06/04/21 1940           Micro    Objective   Vitals:   06/09/21 0400 06/09/21 0748 06/09/21 1049 06/09/21 1400  BP: (!) 123/53 (!) 125/48 (!) 133/49 (!) 124/39  Pulse: (!) 51 (!) 51 62 (!) 47  Resp: '14 11 15 12  '$ Temp: 98 F (36.7 C) 98.4 F (36.9 C) 98.1 F (36.7 C)   TempSrc: Oral Oral Oral   SpO2: 94% 93% 99% 96%  Weight: 44.4 kg       Intake/Output Summary (Last 24 hours) at 06/09/2021 1639 Last data  filed at 06/09/2021 0451 Gross per 24 hour  Intake 240 ml  Output 500 ml  Net -260 ml   Filed Weights   06/07/21 0300 06/08/21 0400 06/09/21 0400  Weight: 50.7 kg 44.1 kg 44.4 kg    Physical Exam:  General exam: sleeping, woke to voice briefly asks to be let sleep, no acute distress Respiratory system: normal respiratory effort, on room air. Cardiovascular system: normal S1/S2, RRR, 2+ DP pulses. Central nervous system: A&Ox3, grossly non-focal exam, normal speech, follows commands Extremities: no edema, moves all  Labs   Data Reviewed: I have personally reviewed following labs and imaging studies  CBC: Recent Labs  Lab 06/03/21 0204 06/04/21 0021 06/06/21 0536 06/09/21 0036  WBC 4.5 7.1 5.3 4.3  NEUTROABS  --  5.3  --   --   HGB 7.3* 7.9* 8.2* 8.2*  HCT 22.9* 24.5* 25.5* 25.1*  MCV 93.5 92.5 91.1 88.1  PLT 170 172 156 [id number]   Basic Metabolic Panel: Recent Labs  Lab 06/05/21 0113 06/06/21 0536 06/07/21 0033 06/08/21 0037 06/09/21 0036  NA 135 135 135 135 133*  K 2.9* 4.4 3.9 3.9 3.7  CL 96* 97* 93* 93* 92*  CO2 '29 29 31 '$ 32 32  GLUCOSE 150* 95 135* 78 90  BUN 36* 34* 31* 32* 30*  CREATININE 3.97* 3.35* 2.97* 2.71* 2.65*  CALCIUM 8.4* 8.8* 9.1 8.7* 8.7*   GFR: Estimated Creatinine Clearance: 15.6 mL/min (A) (by C-G formula based on SCr of 2.65 mg/dL (H)). Liver Function Tests: Recent Labs  Lab 06/04/21 1044  AST 51*  ALT 17  ALKPHOS 88  BILITOT 1.4*  PROT 6.3*  ALBUMIN 3.0*   No results for input(s): LIPASE, AMYLASE in the last 168 hours. Recent Labs  Lab 06/03/21 0204 06/04/21 0021 06/09/21 0814  AMMONIA 19 44* 23   Coagulation Profile: Recent Labs  Lab 06/04/21 1044  INR 1.2   Cardiac Enzymes: No results for input(s): CKTOTAL, CKMB, CKMBINDEX, TROPONINI in the last 168 hours. BNP (last 3 results) No results for input(s): PROBNP in the last 8760 hours. HbA1C: No results for input(s): HGBA1C in the last 72 hours. CBG: Recent Labs  Lab  06/08/21 1052 06/08/21 1658 06/08/21 2039 06/09/21 0614 06/09/21 1051  GLUCAP 204* 93 101* 80 193*   Lipid Profile: No results for input(s): CHOL, HDL, LDLCALC, TRIG, CHOLHDL, LDLDIRECT  in the last 72 hours. Thyroid Function Tests: No results for input(s): TSH, T4TOTAL, FREET4, T3FREE, THYROIDAB in the last 72 hours.  Anemia Panel: No results for input(s): VITAMINB12, FOLATE, FERRITIN, TIBC, IRON, RETICCTPCT in the last 72 hours. Sepsis Labs: Recent Labs  Lab 06/03/21 1549 06/03/21 1846  PROCALCITON 0.24  --   LATICACIDVEN 0.9 0.7    Recent Results (from the past 240 hour(s))  SARS CORONAVIRUS 2 (TAT 6-24 HRS) Nasopharyngeal Nasopharyngeal Swab     Status: None   Collection Time: 06/01/21  1:09 AM   Specimen: Nasopharyngeal Swab  Result Value Ref Range Status   SARS Coronavirus 2 NEGATIVE NEGATIVE Final    Comment: (NOTE) SARS-CoV-2 target nucleic acids are NOT DETECTED.  The SARS-CoV-2 RNA is generally detectable in upper and lower respiratory specimens during the acute phase of infection. Negative results do not preclude SARS-CoV-2 infection, do not rule out co-infections with other pathogens, and should not be used as the sole basis for treatment or other patient management decisions. Negative results must be combined with clinical observations, patient history, and epidemiological information. The expected result is Negative.  Fact Sheet for Patients: SugarRoll.be  Fact Sheet for Healthcare Providers: https://www.woods-mathews.com/  This test is not yet approved or cleared by the [place] FDA and  has been authorized for detection and/or diagnosis of SARS-CoV-2 by FDA under an Emergency Use Authorization (EUA). This EUA will remain  in effect (meaning this test can be used) for the duration of the COVID-19 declaration under Se ction 564(b)(1) of the Act, 21 U.S.C. section 360bbb-3(b)(1), unless the authorization is  terminated or revoked sooner.  Performed at [facility], [place] [address], [place], [place] [address]   Culture, blood (routine x 2)     Status: Abnormal   Collection Time: 06/03/21  3:49 PM   Specimen: BLOOD  Result Value Ref Range Status   Specimen Description BLOOD LEFT ANTECUBITAL  Final   Special Requests   Final    BOTTLES DRAWN AEROBIC AND ANAEROBIC Blood Culture adequate volume   Culture  Setup Time   Final    GRAM POSITIVE COCCI IN CLUSTERS ANAEROBIC BOTTLE ONLY CRITICAL RESULT CALLED TO, READ BACK BY AND VERIFIED WITHDion Body PHARMD 1911 06/04/21 A BROWNING Performed at [facility], [place] [address] [place], [place] [address]    Culture STAPHYLOCOCCUS AUREUS (A)  Final   Report Status 06/06/2021 FINAL  Final   Organism ID, Bacteria STAPHYLOCOCCUS AUREUS  Final      Susceptibility   Staphylococcus aureus - MIC*    CIPROFLOXACIN <=0.5 SENSITIVE Sensitive     ERYTHROMYCIN <=0.25 SENSITIVE Sensitive     GENTAMICIN <=0.5 SENSITIVE Sensitive     OXACILLIN 0.5 SENSITIVE Sensitive     TETRACYCLINE <=1 SENSITIVE Sensitive     VANCOMYCIN 1 SENSITIVE Sensitive     TRIMETH/SULFA <=10 SENSITIVE Sensitive     CLINDAMYCIN <=0.25 SENSITIVE Sensitive     RIFAMPIN <=0.5 SENSITIVE Sensitive     Inducible Clindamycin NEGATIVE Sensitive     * STAPHYLOCOCCUS AUREUS  Blood Culture ID Panel (Reflexed)     Status: Abnormal   Collection Time: 06/03/21  3:49 PM  Result Value Ref Range Status   Enterococcus faecalis NOT DETECTED NOT DETECTED Final   Enterococcus Faecium NOT DETECTED NOT DETECTED Final   Listeria monocytogenes NOT DETECTED NOT DETECTED Final   Staphylococcus species DETECTED (A) NOT DETECTED Final    Comment: CRITICAL RESULT CALLED TO, READ BACK BY AND  VERIFIED WITHDion Body PHARMD 1911 06/04/21 A BROWNING    Staphylococcus aureus (BCID) DETECTED (A) NOT DETECTED Final    Comment: CRITICAL RESULT CALLED TO, READ BACK BY AND VERIFIED WITH: Dion Body PHARMD 1911  06/04/21 A BROWNING    Staphylococcus epidermidis NOT DETECTED NOT DETECTED Final   Staphylococcus lugdunensis NOT DETECTED NOT DETECTED Final   Streptococcus species NOT DETECTED NOT DETECTED Final   Streptococcus agalactiae NOT DETECTED NOT DETECTED Final   Streptococcus pneumoniae NOT DETECTED NOT DETECTED Final   Streptococcus pyogenes NOT DETECTED NOT DETECTED Final   A.calcoaceticus-baumannii NOT DETECTED NOT DETECTED Final   Bacteroides fragilis NOT DETECTED NOT DETECTED Final   Enterobacterales NOT DETECTED NOT DETECTED Final   Enterobacter cloacae complex NOT DETECTED NOT DETECTED Final   Escherichia coli NOT DETECTED NOT DETECTED Final   Klebsiella aerogenes NOT DETECTED NOT DETECTED Final   Klebsiella oxytoca NOT DETECTED NOT DETECTED Final   Klebsiella pneumoniae NOT DETECTED NOT DETECTED Final   Proteus species NOT DETECTED NOT DETECTED Final   Salmonella species NOT DETECTED NOT DETECTED Final   Serratia marcescens NOT DETECTED NOT DETECTED Final   Haemophilus influenzae NOT DETECTED NOT DETECTED Final   Neisseria meningitidis NOT DETECTED NOT DETECTED Final   Pseudomonas aeruginosa NOT DETECTED NOT DETECTED Final   Stenotrophomonas maltophilia NOT DETECTED NOT DETECTED Final   Candida albicans NOT DETECTED NOT DETECTED Final   Candida auris NOT DETECTED NOT DETECTED Final   Candida glabrata NOT DETECTED NOT DETECTED Final   Candida krusei NOT DETECTED NOT DETECTED Final   Candida parapsilosis NOT DETECTED NOT DETECTED Final   Candida tropicalis NOT DETECTED NOT DETECTED Final   Cryptococcus neoformans/gattii NOT DETECTED NOT DETECTED Final   Meth resistant mecA/C and MREJ NOT DETECTED NOT DETECTED Final    Comment: Performed at [facility], [address] [place], [place] [address]  Culture, blood (routine x 2)     Status: None   Collection Time: 06/03/21  4:04 PM   Specimen: BLOOD  Result Value Ref Range Status   Specimen Description BLOOD LEFT ANTECUBITAL   Final   Special Requests   Final    BOTTLES DRAWN AEROBIC AND ANAEROBIC Blood Culture adequate volume   Culture   Final    NO GROWTH 5 DAYS Performed at [facility], [place] [address], [place], [place] [address]    Report Status 06/08/2021 FINAL  Final  Body fluid culture w Gram Stain     Status: None   Collection Time: 06/05/21 10:36 AM   Specimen: Lung, Right; Pleural Fluid  Result Value Ref Range Status   Specimen Description PLEURAL FLUID  Final   Special Requests RIGHT LUNG  Final   Gram Stain   Final    MODERATE WBC PRESENT, PREDOMINANTLY MONONUCLEAR NO ORGANISMS SEEN    Culture   Final    NO GROWTH 3 DAYS Performed at [facility], [place] [address], [place], [place] [address]    Report Status 06/08/2021 FINAL  Final  Culture, blood (routine x 2)     Status: None (Preliminary result)   Collection Time: 06/06/21  5:36 AM   Specimen: BLOOD  Result Value Ref Range Status   Specimen Description BLOOD RIGHT ANTECUBITAL  Final   Special Requests   Final    BOTTLES DRAWN AEROBIC ONLY Blood Culture adequate volume   Culture   Final    NO GROWTH 3 DAYS Performed at [facility], [place]  [address], [place], [place] [address]    Report Status PENDING  Incomplete  Culture, blood (routine x 2)     Status: None (Preliminary result)   Collection Time: 06/06/21  5:43 AM   Specimen: BLOOD RIGHT HAND  Result Value Ref Range Status   Specimen Description BLOOD RIGHT HAND  Final   Special Requests   Final    BOTTLES DRAWN AEROBIC AND ANAEROBIC Blood Culture results may not be optimal due to an inadequate volume of blood received in culture bottles   Culture   Final    NO GROWTH 3 DAYS Performed at [facility], [place] [address], [place], [place] [address]    Report Status PENDING  Incomplete      Imaging Studies   No results found.   Medications   Scheduled Meds:  aspirin  81 mg Oral Daily   atorvastatin  20 mg Oral Daily   calcium-vitamin D  2 tablet Oral  Q breakfast   Chlorhexidine Gluconate Cloth  6 each Topical Daily   dextrose  1 ampule Intravenous Once   feeding supplement  237 mL Oral TID BM   fluticasone  2 spray Each Nare Daily   furosemide  80 mg Oral BID   heparin  5,000 Units Subcutaneous Q8H   hydrocortisone  10 mg Oral BID   isosorbide mononitrate  60 mg Oral Daily   lactulose  20 g Oral TID   levothyroxine  150 mcg Oral Q0600   metoprolol succinate  25 mg Oral Daily   multivitamin with minerals  1 tablet Oral Daily   pantoprazole  80 mg Oral Daily   polyethylene glycol  17 g Oral Daily   sodium chloride flush  3 mL Intravenous Q12H   vitamin B-12  1,000 mcg Oral Daily   Continuous Infusions:  sodium chloride      ceFAZolin (ANCEF) IV 2 g (06/09/21 0836)       LOS: 8 days    Time spent: 25 minutes with > 50% spent at bedside and in coordination of care.    Ezekiel Slocumb, DO [facility]  06/09/2021, 4:39 PM      If 7PM-7AM, please contact night-coverage. How to contact the [facility] Attending or Consulting provider [place] or covering provider during after hours [place], for this patient?    Check the care team in [facility] and look for a) attending/consulting [facility] provider listed and b) the [facility] team listed Log into [email or web address] and use [place]'s universal password to access. If you do not have the password, please contact the hospital operator. Locate the [facility] provider you are looking for under [facility] and page to a number that you can be directly reached. If you still have difficulty reaching the provider, please page the [facility] (Director on Call) for the Hospitalists listed on amion for assistance.

## 2021-06-09 NOTE — Plan of Care (Signed)

## 2021-06-10 DIAGNOSIS — R7881 Bacteremia: Secondary | ICD-10-CM

## 2021-06-10 DIAGNOSIS — B9561 Methicillin susceptible Staphylococcus aureus infection as the cause of diseases classified elsewhere: Secondary | ICD-10-CM

## 2021-06-10 LAB — GLUCOSE, CAPILLARY
Glucose-Capillary: 87 mg/dL (ref 70–99)
Glucose-Capillary: 185 mg/dL — ABNORMAL HIGH (ref 70–99)
Glucose-Capillary: 252 mg/dL — ABNORMAL HIGH (ref 70–99)
Glucose-Capillary: 256 mg/dL — ABNORMAL HIGH (ref 70–99)

## 2021-06-10 LAB — BASIC METABOLIC PANEL
Anion gap: 9 (ref 5–15)
BUN: 32 mg/dL — ABNORMAL HIGH (ref 8–23)
CO2: 31 mmol/L (ref 22–32)
Calcium: 8.7 mg/dL — ABNORMAL LOW (ref 8.9–10.3)
Chloride: 89 mmol/L — ABNORMAL LOW (ref 98–111)
Creatinine, Ser: 3.32 mg/dL — ABNORMAL HIGH (ref 0.44–1.00)
GFR, Estimated: 15 mL/min — ABNORMAL LOW (ref >60)
Glucose, Bld: 228 mg/dL — ABNORMAL HIGH (ref 70–99)
Potassium: 4.5 mmol/L (ref 3.5–5.1)
Sodium: 129 mmol/L — ABNORMAL LOW (ref 135–145)

## 2021-06-10 MED ORDER — INSULIN ASPART 100 UNIT/ML IJ SOLN
0.0000 [IU] | Freq: Three times a day (TID) | INTRAMUSCULAR | Status: DC
Start: 2021-06-11 — End: 2021-06-11
  Administered 2021-06-11: 1 [IU] via SUBCUTANEOUS
  Administered 2021-06-11: 3 [IU] via SUBCUTANEOUS

## 2021-06-10 MED ORDER — INSULIN ASPART 100 UNIT/ML IJ SOLN
0.0000 [IU] | Freq: Three times a day (TID) | INTRAMUSCULAR | Status: DC
Start: 2021-06-11 — End: 2021-06-10

## 2021-06-10 MED ORDER — LINEZOLID 600 MG PO TABS
600.0000 mg | ORAL_TABLET | Freq: Two times a day (BID) | ORAL | Status: DC
  Administered 2021-06-11: 600 mg via ORAL
  Filled 2021-06-10 (×2): qty 1

## 2021-06-10 NOTE — Progress Notes (Signed)
Pharmacy Antibiotic Note  [name] is a 60 y.o. female admitted on 06/01/2021 with SOB, fatigue, and hypoglycemia. Pharmacy has been consulted for cefazolin dosing for MSSA bacteremia.  Scr increased to 3.32 (CrCl 12 mL/min). Afebrile. WBC 4.3 on last check 6/21. ID recommends cefazolin through today with plans for 7 days of linezolid (order pended at discharge).   Plan: Continue cefazolin 2g q12h through 6/22 Start linezolid 600 mg BID starting 6/23 for 7 days  Monitor renal function, cultures, and clinical progression  Weight: 44.8 kg (98 lb 12.3 oz)  Temp (24hrs), Avg:98.3 F (36.8 C), Min:98 F (36.7 C), Max:98.5 F (36.9 C)  Recent Labs  Lab 06/03/21 1549 06/03/21 1846 06/04/21 0021 06/05/21 0113 06/06/21 0536 06/07/21 0033 06/08/21 0037 06/09/21 0036 06/10/21 0016  WBC  --   --  7.1  --  5.3  --   --  4.3  --   CREATININE  --   --  4.18*   < > 3.35* 2.97* 2.71* 2.65* 3.32*  LATICACIDVEN 0.9 0.7  --   --   --   --   --   --   --    < > = values in this interval not displayed.     Estimated Creatinine Clearance: 12.6 mL/min (A) (by C-G formula based on SCr of 3.32 mg/dL (H)).    Allergies  Allergen Reactions   Beef-Derived Products     RELIGOUS  REASONS   Pork-Derived Products     RELIGIOUS REASONS   Percocet [Oxycodone-Acetaminophen] Rash    Antimicrobials this admission: Cefazolin 6/16 >> 6/22 Linezolid 6/23>>   Dose adjustments this admission: N/a  Microbiology results: 6/15 bcx: 1/4 MSSA 6/17 bcx: ngtd  6/18 bcx: ngtd   Thank you for allowing pharmacy to be a part of this patient's care.   Antonietta Jewel, PharmD, [facility] Clinical Pharmacist  Phone: [phone] 06/10/2021 10:07 AM  Please check AMION for all [facility] phone numbers After 10:00 PM, call [employer] [phone]

## 2021-06-10 NOTE — Progress Notes (Signed)
Physical Therapy Treatment Patient Details Name: [name] MRN: [id number] DOB: 19-Apr-1961 Today's Date: 06/10/2021    History of Present Illness Pt is a 60 y.o. F who presents with SOB, imaging showed bilateral pleural effusions and worsening renal function. Significant PMH: CKD stage 4, DM2, CHF.    PT Comments    Pt demonstrates increased activity tolerance this session as well as improved transfer quality. Pt declines attempts at stair negotiation this session, however will benefit from further encouragement to participate in stair training next session. Pt will also benefit from continued acute PT POC to further increase activity tolerance and to challenge dynamic gait and standing balance.  Follow Up Recommendations  No PT follow up;Supervision for mobility/OOB     Equipment Recommendations  None recommended by PT    Recommendations for Other Services       Precautions / Restrictions Precautions Precautions: Fall Precaution Comments: near falls PTA due to dizziness Restrictions Weight Bearing Restrictions: No    Mobility  Bed Mobility Overal bed mobility: Modified Independent Bed Mobility: Supine to Sit;Sit to Supine     Supine to sit: Modified independent (Device/Increase time) Sit to supine: Modified independent (Device/Increase time)   General bed mobility comments: increased time    Transfers Overall transfer level: Modified independent Equipment used: Rolling walker (2 wheeled) Transfers: Sit to/from Stand Sit to Stand: Modified independent (Device/Increase time)            Ambulation/Gait Ambulation/Gait assistance: Supervision Gait Distance (Feet): 300 Feet Assistive device: Rolling walker (2 wheeled) Gait Pattern/deviations: Step-through pattern Gait velocity: functional Gait velocity interpretation: 1.31 - 2.62 ft/sec, indicative of limited community ambulator General Gait Details: pt with slowed step-through gait, verbal cues for safety  and line management   Stairs             Wheelchair Mobility    Modified Rankin (Stroke Patients Only)       Balance Overall balance assessment: Needs assistance Sitting-balance support: No upper extremity supported;Feet supported Sitting balance-Leahy Scale: Good     Standing balance support: Single extremity supported;No upper extremity supported Standing balance-Leahy Scale: Fair                              Cognition Arousal/Alertness: Awake/alert Behavior During Therapy: WFL for tasks assessed/performed Overall Cognitive Status: Within Functional Limits for tasks assessed (for PT session)                                        Exercises      General Comments General comments (skin integrity, edema, etc.): VSS on RA      Pertinent Vitals/Pain Pain Assessment: No/denies pain    Home Living                      Prior Function            PT Goals (current goals can now be found in the care plan section) Acute Rehab PT Goals Patient Stated Goal: to go home Progress towards PT goals: Progressing toward goals    Frequency    Min 3X/week      PT Plan Current plan remains appropriate    Co-evaluation              AM-PAC PT "6 Clicks" Mobility   Outcome Measure  Help needed turning  from your back to your side while in a flat bed without using bedrails?: None Help needed moving from lying on your back to sitting on the side of a flat bed without using bedrails?: None Help needed moving to and from a bed to a chair (including a wheelchair)?: None Help needed standing up from a chair using your arms (e.g., wheelchair or bedside chair)?: None Help needed to walk in hospital room?: A Little Help needed climbing 3-5 steps with a railing? : A Little 6 Click Score: 22    End of Session   Activity Tolerance: Patient tolerated treatment well Patient left: in bed;with call bell/phone within reach;with bed  alarm set;with family/visitor present Nurse Communication: Mobility status PT Visit Diagnosis: Unsteadiness on feet (R26.81);Dizziness and giddiness (R42)     Time: [id number] PT Time Calculation (min) (ACUTE ONLY): 18 min  Charges:  $Therapeutic Activity: 8-22 mins                     Zenaida Niece, PT, DPT Acute Rehabilitation Pager: [phone]    Zenaida Niece 06/10/2021, 12:47 PM

## 2021-06-10 NOTE — Plan of Care (Signed)

## 2021-06-10 NOTE — Progress Notes (Signed)
PROGRESS NOTE    [name]  [id number] DOB: 1961-01-23 DOA: 06/01/2021 PCP: Ladell Pier, MD   Chief Complain: Hypoglycemia, altered mental status  Brief Narrative: Patient is a 60 year old female with history of CKD stage IV, diabetes type 2, bilateral renal artery stenosis was admitted initially for altered mental status.  She was found to be hypoglycemic on presentation.  She was also noted to be short of breath, imaging showed bilateral pleural effusion.  Lab work showed worsening renal function.  Nephrology and pulmonology was consulted on admission.  Her hypoglycemia has resolved.  Kidney function improved with IV Lasix under nephrology management.  She was also having fever during this hospitalization, blood culture was positive for MSSA in a  single bottle.  ID was following and recommended to be on Ancef.  Last dose of Ancef tonight.  Plan for discharge tomorrow to home if improvement in the kidney function.  She needs to be  on 7 days course of linezolid on discharge.  Assessment & Plan:   Principal Problem:   Bacteremia due to methicillin susceptible Staphylococcus aureus (MSSA) Active Problems:   CKD (chronic kidney disease) stage 4, GFR 15-29 ml/min (HCC)   HTN (hypertension)   Anemia due to chronic kidney disease   Adrenal insufficiency (Addison's disease) (HCC)   AKI (acute kidney injury) ([facility])   Acute on chronic diastolic CHF (congestive heart failure) (HCC)   DM2 (diabetes mellitus, type 2) (HCC)   CHF exacerbation (HCC)   Volume overload   Respiratory failure (HCC)   Protein-calorie malnutrition, severe  MSSA bacteremia: Currently afebrile, hemodynamically stable.  ID was following.  She was febrile during this hospitalization, blood culture was positive for MSSA in a  single bottle.  ID  recommended to be on Ancef.  Last dose of Ancef tonight.   She needs to be  on 7 days course of linezolid on discharge.  Acute respiratory failure with hypoxia  secondary to pulmonary edema/better pleural effusion: Hypoxic on presentation requiring 6-10 l of oxygen.  Not on home oxygen.  Chest x-ray showed persistent bilateral pleural effusion.  VQ scan was negative for PE.  She also underwent ultrasound-guided thoracentesis on 6/17 with removal of 1 mL of fluid.  Currently respiratory status stable and she is on room air.  AKI and CKD stage IV: Nephrology was following during this hospitalization.  Baseline creatinine around 2.5.  Presented with creatinine in the range of 4.  Renal ultrasound was negative for hydronephrosis/obstruction.  Nephrology signed off and recommended Lasix 80 mg twice a day. Lab work today showed bump in the creatinine in the range of 3, also developed mild hyponatremia.  We will hold Lasix for today.  We will check kidney function tomorrow.  She needs to be on Lasix on discharge.  Acute metabolic encephalopathy: Multifactorial including hypoglycemia, hyperammonemia, hypoxia, sepsis.  Currently she is alert and oriented and at baseline.  Elevated ammonia level: Noticed during this hospitalization.  Currently mental status stable, alert and oriented.  Continue lactulose.  CT abdomen/pelvis on 05/12/21  did not show any liver or biliary abnormality.  History of adrenal insufficiency: Follows with Dr. Loanne Drilling.  Previously on Solu-Cortef.  Cortisol level on admission was 2.5.  Cosyntropin stimulation test was positive.  We recommend to follow-up with Dr. Loanne Drilling on discharge.  Continue Solu-Cortef 10 mg twice daily.  Hyponatremia could be associated with adrenal  insufficiency.  Hypothyroidism: Continue levothyroxine  Normocytic anemia: Most likely acid with CKD.  Baseline hemoglobin around 7.8.  Currently hemoglobin at baseline.  Monitor CBC as an outpatient  Hypertension/history of bilateral renal artery stenosis: Currently on imdur  Nephrology recommended to stop hydralazine.  Needs to continue Lasix on discharge.  Blood pressure  stable  Protein calorie Malnutrition: BMI of 16.8         Nutrition Problem: Severe Malnutrition Etiology: chronic illness (CHF, CKD stage IV)      DVT prophylaxis: Heparin subcu Code Status: Full code Family Communication: None at the bedside Status is: Inpatient  Remains inpatient appropriate because:IV treatments appropriate due to intensity of illness or inability to take PO  Dispo: The patient is from: Home              Anticipated d/c is to: Home              Patient currently is not medically stable to d/c.   Difficult to place patient No     Consultants: ID, nephrology  Procedures: Thoracentesis Antimicrobials:  Anti-infectives (From admission, onward)    Start     Dose/Rate Route Frequency Ordered Stop   06/11/21 1000  linezolid (ZYVOX) tablet 600 mg        600 mg Oral Every 12 hours 06/10/21 1008 06/18/21 0959   06/11/21 0000  linezolid (ZYVOX) 600 MG tablet       Note to Pharmacy: Patient not discharging till later in the week but uninsured so wanted to send early   600 mg Oral 2 times daily 06/08/21 1432 06/18/21 2359   06/04/21 2030  ceFAZolin (ANCEF) IVPB 2g/100 mL premix        2 g 200 mL/hr over 30 Minutes Intravenous Every 12 hours 06/04/21 1940 06/10/21 2359       Subjective:  Patient seen and examined the bedside this morning.  Hemodynamically stable.  Comfortable.  On room air.  Denies any shortness of breath but complains of some cough.  She does not have lower extremity swelling.  Eager to go home.  Objective: Vitals:   06/09/21 1950 06/09/21 2347 06/10/21 0430 06/10/21 0802  BP: (!) 124/44 (!) 131/47 (!) 147/50 (!) 132/48  Pulse: (!) 55 (!) 57 (!) 59 (!) 57  Resp: '14 13 13 13  '$ Temp: 98.3 F (36.8 C) 98.3 F (36.8 C) 98.5 F (36.9 C) 98.3 F (36.8 C)  TempSrc: Axillary Oral Oral Oral  SpO2:  96% 96% 94%  Weight:   44.8 kg     Intake/Output Summary (Last 24 hours) at 06/10/2021 1059 Last data filed at 06/10/2021 0815 Gross  per 24 hour  Intake 540 ml  Output 100 ml  Net 440 ml   Filed Weights   06/08/21 0400 06/09/21 0400 06/10/21 0430  Weight: 44.1 kg 44.4 kg 44.8 kg    Examination:  General exam: Appears calm and comfortable ,Not in distress,thin built, pleasant female HEENT:PERRL,Oral mucosa moist, Ear/Nose normal on gross exam Respiratory system: Bilateral equal air entry, normal vesicular breath sounds, no wheezes or crackles  Cardiovascular system: S1 & S2 heard, RRR. No JVD, murmurs, rubs, gallops or clicks. No pedal edema. Gastrointestinal system: Abdomen is nondistended, soft and nontender. No organomegaly or masses felt. Normal bowel sounds heard. Central nervous system: Alert and oriented. No focal neurological deficits. Extremities: No edema, no clubbing ,no cyanosis Skin: No rashes, lesions or ulcers,no icterus ,no pallor     Data Reviewed: I have personally reviewed following labs and imaging studies  CBC: Recent Labs  Lab 06/04/21 0021 06/06/21 0536 06/09/21 0036  WBC 7.1  5.3 4.3  NEUTROABS 5.3  --   --   HGB 7.9* 8.2* 8.2*  HCT 24.5* 25.5* 25.1*  MCV 92.5 91.1 88.1  PLT 172 156 [id number]   Basic Metabolic Panel: Recent Labs  Lab 06/06/21 0536 06/07/21 0033 06/08/21 0037 06/09/21 0036 06/10/21 0016  NA 135 135 135 133* 129*  K 4.4 3.9 3.9 3.7 4.5  CL 97* 93* 93* 92* 89*  CO2 29 31 32 32 31  GLUCOSE 95 135* 78 90 228*  BUN 34* 31* 32* 30* 32*  CREATININE 3.35* 2.97* 2.71* 2.65* 3.32*  CALCIUM 8.8* 9.1 8.7* 8.7* 8.7*   GFR: Estimated Creatinine Clearance: 12.6 mL/min (A) (by C-G formula based on SCr of 3.32 mg/dL (H)). Liver Function Tests: Recent Labs  Lab 06/04/21 1044  AST 51*  ALT 17  ALKPHOS 88  BILITOT 1.4*  PROT 6.3*  ALBUMIN 3.0*   No results for input(s): LIPASE, AMYLASE in the last 168 hours. Recent Labs  Lab 06/04/21 0021 06/09/21 0814  AMMONIA 44* 23   Coagulation Profile: Recent Labs  Lab 06/04/21 1044  INR 1.2   Cardiac Enzymes: No  results for input(s): CKTOTAL, CKMB, CKMBINDEX, TROPONINI in the last 168 hours. BNP (last 3 results) No results for input(s): PROBNP in the last 8760 hours. HbA1C: No results for input(s): HGBA1C in the last 72 hours. CBG: Recent Labs  Lab 06/09/21 0614 06/09/21 1051 06/09/21 1703 06/09/21 2134 06/10/21 0621  GLUCAP 80 193* 159* 227* 87   Lipid Profile: No results for input(s): CHOL, HDL, LDLCALC, TRIG, CHOLHDL, LDLDIRECT in the last 72 hours. Thyroid Function Tests: No results for input(s): TSH, T4TOTAL, FREET4, T3FREE, THYROIDAB in the last 72 hours. Anemia Panel: No results for input(s): VITAMINB12, FOLATE, FERRITIN, TIBC, IRON, RETICCTPCT in the last 72 hours. Sepsis Labs: Recent Labs  Lab 06/03/21 1549 06/03/21 1846  PROCALCITON 0.24  --   LATICACIDVEN 0.9 0.7    Recent Results (from the past 240 hour(s))  SARS CORONAVIRUS 2 (TAT 6-24 HRS) Nasopharyngeal Nasopharyngeal Swab     Status: None   Collection Time: 06/01/21  1:09 AM   Specimen: Nasopharyngeal Swab  Result Value Ref Range Status   SARS Coronavirus 2 NEGATIVE NEGATIVE Final    Comment: (NOTE) SARS-CoV-2 target nucleic acids are NOT DETECTED.  The SARS-CoV-2 RNA is generally detectable in upper and lower respiratory specimens during the acute phase of infection. Negative results do not preclude SARS-CoV-2 infection, do not rule out co-infections with other pathogens, and should not be used as the sole basis for treatment or other patient management decisions. Negative results must be combined with clinical observations, patient history, and epidemiological information. The expected result is Negative.  Fact Sheet for Patients: SugarRoll.be  Fact Sheet for Healthcare Providers: https://www.woods-mathews.com/  This test is not yet approved or cleared by the [place] FDA and  has been authorized for detection and/or diagnosis of SARS-CoV-2 by FDA under an  Emergency Use Authorization (EUA). This EUA will remain  in effect (meaning this test can be used) for the duration of the COVID-19 declaration under Se ction 564(b)(1) of the Act, 21 U.S.C. section 360bbb-3(b)(1), unless the authorization is terminated or revoked sooner.  Performed at [facility], [place] [address] [place], [place] [address]   Culture, blood (routine x 2)     Status: Abnormal   Collection Time: 06/03/21  3:49 PM   Specimen: BLOOD  Result Value Ref Range Status   Specimen Description BLOOD LEFT ANTECUBITAL  Final   Special Requests   Final    BOTTLES DRAWN AEROBIC AND ANAEROBIC Blood Culture adequate volume   Culture  Setup Time   Final    GRAM POSITIVE COCCI IN CLUSTERS ANAEROBIC BOTTLE ONLY CRITICAL RESULT CALLED TO, READ BACK BY AND VERIFIED WITHDion Body PHARMD 1911 06/04/21 A BROWNING Performed at [facility], [place] [address] [place], [place] [address]    Culture STAPHYLOCOCCUS AUREUS (A)  Final   Report Status 06/06/2021 FINAL  Final   Organism ID, Bacteria STAPHYLOCOCCUS AUREUS  Final      Susceptibility   Staphylococcus aureus - MIC*    CIPROFLOXACIN <=0.5 SENSITIVE Sensitive     ERYTHROMYCIN <=0.25 SENSITIVE Sensitive     GENTAMICIN <=0.5 SENSITIVE Sensitive     OXACILLIN 0.5 SENSITIVE Sensitive     TETRACYCLINE <=1 SENSITIVE Sensitive     VANCOMYCIN 1 SENSITIVE Sensitive     TRIMETH/SULFA <=10 SENSITIVE Sensitive     CLINDAMYCIN <=0.25 SENSITIVE Sensitive     RIFAMPIN <=0.5 SENSITIVE Sensitive     Inducible Clindamycin NEGATIVE Sensitive     * STAPHYLOCOCCUS AUREUS  Blood Culture ID Panel (Reflexed)     Status: Abnormal   Collection Time: 06/03/21  3:49 PM  Result Value Ref Range Status   Enterococcus faecalis NOT DETECTED NOT DETECTED Final   Enterococcus Faecium NOT DETECTED NOT DETECTED Final   Listeria monocytogenes NOT DETECTED NOT DETECTED Final   Staphylococcus species DETECTED (A) NOT DETECTED Final    Comment: CRITICAL  RESULT CALLED TO, READ BACK BY AND VERIFIED WITHDion Body PHARMD 1911 06/04/21 A BROWNING    Staphylococcus aureus (BCID) DETECTED (A) NOT DETECTED Final    Comment: CRITICAL RESULT CALLED TO, READ BACK BY AND VERIFIED WITHDion Body PHARMD 1911 06/04/21 A BROWNING    Staphylococcus epidermidis NOT DETECTED NOT DETECTED Final   Staphylococcus lugdunensis NOT DETECTED NOT DETECTED Final   Streptococcus species NOT DETECTED NOT DETECTED Final   Streptococcus agalactiae NOT DETECTED NOT DETECTED Final   Streptococcus pneumoniae NOT DETECTED NOT DETECTED Final   Streptococcus pyogenes NOT DETECTED NOT DETECTED Final   A.calcoaceticus-baumannii NOT DETECTED NOT DETECTED Final   Bacteroides fragilis NOT DETECTED NOT DETECTED Final   Enterobacterales NOT DETECTED NOT DETECTED Final   Enterobacter cloacae complex NOT DETECTED NOT DETECTED Final   Escherichia coli NOT DETECTED NOT DETECTED Final   Klebsiella aerogenes NOT DETECTED NOT DETECTED Final   Klebsiella oxytoca NOT DETECTED NOT DETECTED Final   Klebsiella pneumoniae NOT DETECTED NOT DETECTED Final   Proteus species NOT DETECTED NOT DETECTED Final   Salmonella species NOT DETECTED NOT DETECTED Final   Serratia marcescens NOT DETECTED NOT DETECTED Final   Haemophilus influenzae NOT DETECTED NOT DETECTED Final   Neisseria meningitidis NOT DETECTED NOT DETECTED Final   Pseudomonas aeruginosa NOT DETECTED NOT DETECTED Final   Stenotrophomonas maltophilia NOT DETECTED NOT DETECTED Final   Candida albicans NOT DETECTED NOT DETECTED Final   Candida auris NOT DETECTED NOT DETECTED Final   Candida glabrata NOT DETECTED NOT DETECTED Final   Candida krusei NOT DETECTED NOT DETECTED Final   Candida parapsilosis NOT DETECTED NOT DETECTED Final   Candida tropicalis NOT DETECTED NOT DETECTED Final   Cryptococcus neoformans/gattii NOT DETECTED NOT DETECTED Final   Meth resistant mecA/C and MREJ NOT DETECTED NOT DETECTED Final    Comment: Performed  at [facility], [address] [place], [place] [address]  Culture, blood (routine x 2)  Status: None   Collection Time: 06/03/21  4:04 PM   Specimen: BLOOD  Result Value Ref Range Status   Specimen Description BLOOD LEFT ANTECUBITAL  Final   Special Requests   Final    BOTTLES DRAWN AEROBIC AND ANAEROBIC Blood Culture adequate volume   Culture   Final    NO GROWTH 5 DAYS Performed at [facility], [address], [place], [place] [address]    Report Status 06/08/2021 FINAL  Final  Body fluid culture w Gram Stain     Status: None   Collection Time: 06/05/21 10:36 AM   Specimen: Lung, Right; Pleural Fluid  Result Value Ref Range Status   Specimen Description PLEURAL FLUID  Final   Special Requests RIGHT LUNG  Final   Gram Stain   Final    MODERATE WBC PRESENT, PREDOMINANTLY MONONUCLEAR NO ORGANISMS SEEN    Culture   Final    NO GROWTH 3 DAYS Performed at [facility], [place] [address] [place], [place] [address]    Report Status 06/08/2021 FINAL  Final  Culture, blood (routine x 2)     Status: None (Preliminary result)   Collection Time: 06/06/21  5:36 AM   Specimen: BLOOD  Result Value Ref Range Status   Specimen Description BLOOD RIGHT ANTECUBITAL  Final   Special Requests   Final    BOTTLES DRAWN AEROBIC ONLY Blood Culture adequate volume   Culture   Final    NO GROWTH 4 DAYS Performed at [facility], [place] [address], [place], [place] [address]    Report Status PENDING  Incomplete  Culture, blood (routine x 2)     Status: None (Preliminary result)   Collection Time: 06/06/21  5:43 AM   Specimen: BLOOD RIGHT HAND  Result Value Ref Range Status   Specimen Description BLOOD RIGHT HAND  Final   Special Requests   Final    BOTTLES DRAWN AEROBIC AND ANAEROBIC Blood Culture results may not be optimal due to an inadequate volume of blood received in culture bottles   Culture   Final    NO GROWTH 4 DAYS Performed at [facility], [place]  [address], [place], [place] [address]    Report Status PENDING  Incomplete         Radiology Studies: No results found.      Scheduled Meds:  aspirin  81 mg Oral Daily   atorvastatin  20 mg Oral Daily   calcium-vitamin D  2 tablet Oral Q breakfast   Chlorhexidine Gluconate Cloth  6 each Topical Daily   dextrose  1 ampule Intravenous Once   feeding supplement  237 mL Oral TID BM   fluticasone  2 spray Each Nare Daily   heparin  5,000 Units Subcutaneous Q8H   hydrocortisone  10 mg Oral BID   isosorbide mononitrate  60 mg Oral Daily   lactulose  20 g Oral TID   levothyroxine  150 mcg Oral Q0600   [START ON 06/11/2021] linezolid  600 mg Oral Q12H   multivitamin with minerals  1 tablet Oral Daily   pantoprazole  80 mg Oral Daily   polyethylene glycol  17 g Oral Daily   sodium chloride flush  3 mL Intravenous Q12H   vitamin B-12  1,000 mcg Oral Daily   Continuous Infusions:  sodium chloride      ceFAZolin (ANCEF) IV 2 g (06/10/21 0900)     LOS: 9 days    Time  spent: 35 mins.More than 50% of that time was spent in counseling and/or coordination of care.      Shelly Coss, MD [facility] P6/22/2022, 10:59 AM

## 2021-06-11 ENCOUNTER — Other Ambulatory Visit (HOSPITAL_COMMUNITY): Payer: Self-pay

## 2021-06-11 LAB — CULTURE, BLOOD (ROUTINE X 2)
Culture: NO GROWTH
Culture: NO GROWTH
Special Requests: ADEQUATE

## 2021-06-11 LAB — BASIC METABOLIC PANEL
Anion gap: 9 (ref 5–15)
BUN: 32 mg/dL — ABNORMAL HIGH (ref 8–23)
CO2: 28 mmol/L (ref 22–32)
Calcium: 8.8 mg/dL — ABNORMAL LOW (ref 8.9–10.3)
Chloride: 92 mmol/L — ABNORMAL LOW (ref 98–111)
Creatinine, Ser: 3.07 mg/dL — ABNORMAL HIGH (ref 0.44–1.00)
GFR, Estimated: 17 mL/min — ABNORMAL LOW (ref >60)
Glucose, Bld: 226 mg/dL — ABNORMAL HIGH (ref 70–99)
Potassium: 3.9 mmol/L (ref 3.5–5.1)
Sodium: 129 mmol/L — ABNORMAL LOW (ref 135–145)

## 2021-06-11 LAB — GLUCOSE, CAPILLARY
Glucose-Capillary: 146 mg/dL — ABNORMAL HIGH (ref 70–99)
Glucose-Capillary: 213 mg/dL — ABNORMAL HIGH (ref 70–99)

## 2021-06-11 MED ORDER — HYDROCORTISONE 10 MG PO TABS
10.0000 mg | ORAL_TABLET | Freq: Two times a day (BID) | ORAL | 0 refills | Status: DC
  Filled 2021-06-11: qty 60, 30d supply, fill #0

## 2021-06-11 MED ORDER — TORSEMIDE 20 MG PO TABS
20.0000 mg | ORAL_TABLET | Freq: Every day | ORAL | 0 refills | Status: DC | PRN
  Filled 2021-06-11: qty 30, 30d supply, fill #0
  Filled 2021-07-15: qty 30, 30d supply, fill #1

## 2021-06-11 MED ORDER — HYDROCORTISONE 5 MG PO TABS
ORAL_TABLET | ORAL | 0 refills | Status: DC
  Filled 2021-06-11: qty 45, 15d supply, fill #0

## 2021-06-11 NOTE — Plan of Care (Signed)

## 2021-06-11 NOTE — TOC Transition Note (Signed)
Transition of Care [facility]) - CM/SW Discharge Note   Patient Details  Name: Thomas Sanpedro MRN: [id number] Date of Birth: June 21, 1961  [facility] CM/SW Contact:  Zenon Mayo, RN Phone Number: 06/11/2021, 11:57 AM   Clinical Narrative:    NCM spoke with patient, she states she does not have any funds but her daughter will help her to pay for medicaitons.  NCM assisting with Match Letter.  NCM informed MD to send meds to [facility].    Final next level of care: Home/Self Care Barriers to Discharge: No Barriers Identified   Patient Goals and CMS Choice Patient states their goals for this hospitalization and ongoing recovery are:: return home   Choice offered to / list presented to : NA  Discharge Placement                       Discharge Plan and Services                  DME Agency: NA                  Social Determinants of Health (SDOH) Interventions     Readmission Risk Interventions Readmission Risk Prevention Plan 05/14/2021  Transportation Screening Complete  PCP or Specialist Appt within 5-7 Days Complete  Home Care Screening Complete  Medication Review (RN CM) Referral to Pharmacy  Some recent data might be hidden

## 2021-06-11 NOTE — Progress Notes (Signed)
Mobility Specialist: Progress Note   06/11/21 1245  Mobility  Activity Ambulated in hall  Level of Assistance Contact guard assist, steadying assist  Assistive Device Front wheel walker  Distance Ambulated (ft) 170 ft  Mobility Ambulated with assistance in hallway  Mobility Response Tolerated well  Mobility performed by Mobility specialist  $Mobility charge 1 Mobility   Pre-Mobility: 62 HR, 100% SpO2 Post-Mobility: 66 HR, 99% SpO2  Pt laterally unsteady during ambulation and required frequent verbal cues for direction. Pt asx throughout. Pt is sitting EOB and is set-up with her lunch tray.   [facility] Sion Thane Mobility Specialist Mobility Specialist Phone: [phone]

## 2021-06-12 ENCOUNTER — Telehealth: Payer: Self-pay

## 2021-06-12 ENCOUNTER — Other Ambulatory Visit: Payer: Self-pay | Admitting: Family Medicine

## 2021-06-12 ENCOUNTER — Other Ambulatory Visit: Payer: Self-pay

## 2021-06-12 ENCOUNTER — Other Ambulatory Visit: Payer: Self-pay | Admitting: Pharmacist

## 2021-06-12 ENCOUNTER — Encounter: Payer: Self-pay | Admitting: Internal Medicine

## 2021-06-12 DIAGNOSIS — E038 Other specified hypothyroidism: Secondary | ICD-10-CM

## 2021-06-12 MED ORDER — LEVOTHYROXINE SODIUM 150 MCG PO TABS
ORAL_TABLET | ORAL | 1 refills | Status: DC
  Filled 2021-06-12: qty 30, 30d supply, fill #0
  Filled 2021-07-15: qty 30, 30d supply, fill #1

## 2021-06-12 MED FILL — Isosorbide Mononitrate Tab ER 24HR 60 MG: ORAL | 30 days supply | Qty: 30 | Fill #2 | Status: AC

## 2021-06-12 MED FILL — Repaglinide Tab 0.5 MG: ORAL | 30 days supply | Qty: 60 | Fill #2 | Status: AC

## 2021-06-12 MED FILL — Repaglinide Tab 0.5 MG: ORAL | 30 days supply | Qty: 60 | Fill #2 | Status: CN

## 2021-06-12 MED FILL — Atorvastatin Calcium Tab 20 MG (Base Equivalent): ORAL | 30 days supply | Qty: 30 | Fill #1 | Status: AC

## 2021-06-12 NOTE — Telephone Encounter (Signed)
Transition Care Management Follow-up Telephone Call   Call completed with patient's daughter, [name] Ramjit. DPR signed  Date of discharge and from where:  [facility] on 06/11/2021 How have you been since you were released from the hospital? She said her mother is okay.  She would like to touch with Dr Wynetta Emery about orders for home health nurse to help with her care.   Any questions or concerns? Yes she stated she will send a message to Dr Cindee Salt to se if can use glipiszide aor metformin for the moment till pharmacy dispense Prandin meds.(Unavailable till Monday at [facility] pharmacy, [facility] is working with other pharmacy to possible dispense it. )   Items Reviewed: Did the pt receive and understand the discharge instructions provided? Yes  Medications obtained and verified? Yes missing Prandin Other? No  Any new allergies since your discharge? No  Do you have support at home? Yes  daughters  Home Care and Equipment/Supplies: Were home health services ordered? no If so, what is the name of the agency? n/a  Has the agency set up a time to come to the patient's home? not applicable Were any new equipment or medical supplies ordered?  no What is the name of the medical supply agency? [employer] Were you able to get the supplies/equipment? yes Do you have any questions related to the use of the equipment or supplies? No    She has a glucometer and [name] stated that her blood sugars have been okay.    Functional Questionnaire: (I = Independent and D = Dependent) ADLs: independent, family has been providing assistance if needed     Follow up appointments reviewed:   PCP Hospital f/u appt confirmed? Yes Dr Wynetta Emery - 07/14/2021 Specialist Hospital f/u appt confirmed? Yes   Are transportation arrangements needed? No  If their condition worsens, is the pt aware to call PCP or go to the Emergency Dept.? Yes Was the patient provided with contact information for the PCP's office or ED?  Yes Was to pt encouraged to call back with questions or concerns? Yes

## 2021-06-12 NOTE — Discharge Summary (Signed)
[facility] Discharge Summary   Patient: [name] [id number]  PCP: Suzanne Pier, MD  Date of admission: 06/01/2021   Date of discharge: 06/11/2021     Discharge Diagnoses:  Principal Problem:   Bacteremia due to methicillin susceptible Staphylococcus aureus (MSSA) Active Problems:   CKD (chronic kidney disease) stage 4, GFR 15-29 ml/min (HCC)   HTN (hypertension)   Anemia due to chronic kidney disease   Adrenal insufficiency (Addison's disease) (HCC)   AKI (acute kidney injury) ([place])   Acute on chronic diastolic CHF (congestive heart failure) (HCC)   DM2 (diabetes mellitus, type 2) (HCC)   CHF exacerbation (HCC)   Volume overload   Respiratory failure ([place])   Protein-calorie malnutrition, severe   Admitted From: home Disposition:  Home   Recommendations for Outpatient Follow-up:  PCP: follow up with PCP in  week,   Follow-up Information     [facility] Follow up on 07/14/2021.   Why: 2:15 with Dr. Karle Soto Contact information: [employer] [phone]        [facility] Follow up on 06/25/2021.   Specialty: Infectious Diseases Why: 9:15 am appointment with Suzanne Madeira, NP Contact information: [address] [place], [place] [id number] [place] [phone]        [place], [place]. Schedule an appointment as soon as possible for a visit in 2 week(s).   Contact information: [place] [address] [phone]         Suzanne Dresser, MD. Schedule an appointment as soon as possible for a visit in 1 month(s).   Specialty: Cardiology Contact information: [address] [place] [address] [phone]                Discharge Instructions     Diet - low sodium heart healthy   Complete by: As directed    Increase activity slowly   Complete by: As  directed        Diet recommendation: Cardiac diet  Activity: The patient is advised to gradually reintroduce usual activities, as tolerated  Discharge Condition: stable  Code Status: Full code   History of present illness: As per the H and P dictated on admission, "[name] is a 60 y.o. female with medical history significant of DM2, CKD 4, HTN, hypothyroidism, dCHF.   Pt recently admitted 5/25-5/28 for severe constipation.   Pt presents to ED today with AMS.  EMS called for AMS, found pt to be hypoglycemic with BGL of 30.  Gave D50.  Mental status improved / baseline post D50.   Patient reports that she has been taking her home medications as prescribed.  States that she takes twice daily Prandin.  Reports that she ate a small bowl of lo mein as well as watermelon tonight.  Reports that she has had little appetite over the past several days.    Patient also reports gradual onset, worsening shortness of breath and dyspnea on exertion for the past several days.  Associated fatigue.  Patient reports that her shortness of breath was so severe she required oxygen from her sons oxygen supply.   Has dry cough.   No fevers, chills, CP, abd pain."  Hospital Course:  Summary of her active problems in the hospital is as following. MSSA bacteremia: Currently afebrile, hemodynamically stable.  ID was following.  She was febrile during this hospitalization, blood culture was positive for  MSSA in a  single bottle.  ID  recommended to be on Ancef.   She needs to be  on 7 days course of linezolid on discharge.   Acute respiratory failure with hypoxia secondary to pulmonary edema/better pleural effusion: Hypoxic on presentation requiring 6-10 l of oxygen.  Not on home oxygen.  Chest x-ray showed persistent bilateral pleural effusion.  VQ scan was negative for PE.  She also underwent ultrasound-guided thoracentesis on 6/17 with removal of 1 mL of fluid.  Currently respiratory status stable and  she is on room air.   AKI and CKD stage IV: Nephrology was following during this hospitalization.  Baseline creatinine around 2.5.  Presented with creatinine in the range of 4.  Renal ultrasound was negative for hydronephrosis/obstruction.  Nephrology signed off and recommended Lasix 80 mg twice a day.   Acute metabolic encephalopathy: Multifactorial including hypoglycemia, hyperammonemia, hypoxia, sepsis.  Currently she is alert and oriented and at baseline.   Elevated ammonia level: Noticed during this hospitalization.  Currently mental status stable, alert and oriented.  Continue lactulose.  CT abdomen/pelvis on 05/12/21  did not show any liver or biliary abnormality.   History of adrenal insufficiency: Follows with [name].  Previously on Solu-Cortef.  Cortisol level on admission was 2.5.  Cosyntropin stimulation test was positive.  We recommend to follow-up with [name] on discharge.  on  Solu-Cortef 10 mg twice daily, dose adjusted to minimize volume overload Hyponatremia could be associated with adrenal  insufficiency.   Hypothyroidism: Continue levothyroxine   Normocytic anemia: Most likely acid with CKD.  Baseline hemoglobin around 7.8.  Currently hemoglobin at baseline.  Monitor CBC as an outpatient   Hypertension/history of bilateral renal artery stenosis: Currently on imdur  Nephrology recommended to stop hydralazine.  Needs to continue Lasix on discharge.  Blood pressure stable   Severe Protein calorie Malnutrition: Placing the pt at higher risk of poor outcomes  Body mass index is 16.82 kg/m.  Nutrition Problem: Severe Malnutrition Etiology: chronic illness (CHF, CKD stage IV) Nutrition Interventions: Interventions: Ensure Enlive (each supplement provides 350kcal and 20 grams of protein), MVI, Education, Refer to RD note for recommendations  Patient was ambulatory without any assistance. On the day of the discharge the patient's vitals were stable, and no other new  acute medical condition were reported. The patient was felt safe to be discharge at Home with no therapy needed on discharge.  Consultants: ID, PCCM, Nephrology  Procedures: Echocardiogram   DISCHARGE MEDICATION: Allergies as of 06/11/2021       Reactions   Beef-derived Products    RELIGOUS  REASONS   Pork-derived Products    RELIGIOUS REASONS   Percocet [oxycodone-acetaminophen] Rash        Medication List     STOP taking these medications    hydrALAZINE 100 MG tablet Commonly known as: APRESOLINE   metoprolol succinate 25 MG 24 hr tablet Commonly known as: TOPROL-XL       TAKE these medications    acetaminophen 325 MG tablet Commonly known as: TYLENOL Take 2 tablets (650 mg total) by mouth every 6 (six) hours as needed for mild pain.   amLODipine 10 MG tablet Commonly known as: NORVASC TAKE 1 TABLET (10 MG TOTAL) BY MOUTH DAILY. What changed: how much to take   aspirin 81 MG chewable tablet Chew 81 mg by mouth daily.   atorvastatin 20 MG tablet Commonly known as: LIPITOR TAKE 1 TABLET (20 MG TOTAL) BY MOUTH DAILY. What changed: how  much to take   CALCIUM 1200 PO Take 1,200 mg by mouth daily.   fluticasone 50 MCG/ACT nasal spray Commonly known as: FLONASE PLACE 2 SPRAYS INTO BOTH NOSTRILS DAILY. What changed:  when to take this reasons to take this   hydrocortisone 5 MG tablet Commonly known as: CORTEF Take 2 tablets (10 mg total) by mouth in the morning AND 1 tablet (5 mg total) every evening.   isosorbide mononitrate 60 MG 24 hr tablet Commonly known as: IMDUR TAKE 1 TABLET BY MOUTH DAILY What changed: how much to take   linezolid 600 MG tablet Commonly known as: ZYVOX Take 1 tablet (600 mg total) by mouth 2 (two) times daily for 7 days.   multivitamin with minerals Tabs tablet Take 1 tablet by mouth daily.   omeprazole 40 MG capsule Commonly known as: PRILOSEC TAKE 1 CAPSULE (40 MG TOTAL) BY MOUTH DAILY. What changed: how much to  take   ondansetron 4 MG tablet Commonly known as: Zofran Take 1 tablet (4 mg total) by mouth daily as needed for nausea or vomiting.   polyethylene glycol 17 g packet Commonly known as: MIRALAX / GLYCOLAX Take 17 g by mouth daily.   repaglinide 0.5 MG tablet Commonly known as: PRANDIN TAKE 1 TABLET (0.5 MG TOTAL) BY MOUTH 2 (TWO) TIMES DAILY BEFORE A MEAL. What changed:  how much to take how to take this when to take this   torsemide 20 MG tablet Commonly known as: Demadex Take 1 tablet (20 mg total) by mouth daily as needed (for weight gain of 3lbs in 1 day or 5 lbs in 2 days.). What changed:  how much to take when to take this reasons to take this   traMADol 50 MG tablet Commonly known as: ULTRAM Take 1 tablet (50 mg total) by mouth every 12 (twelve) hours as needed. What changed: reasons to take this   vitamin B-12 1000 MCG tablet Commonly known as: CYANOCOBALAMIN Take 1 tablet (1,000 mcg total) by mouth daily.        Discharge Exam: Filed Weights   06/09/21 0400 06/10/21 0430 06/11/21 0308  Weight: 44.4 kg 44.8 kg 44.5 kg   Vitals:   06/11/21 0400 06/11/21 0733  BP: (!) 149/53 (!) 136/53  Pulse: (!) 55 62  Resp: 13 12  Temp:  98 F (36.7 C)  SpO2:  97%   General: Appear in mild distress, no Rash; Oral Mucosa Clear, moist. no Abnormal Neck Mass Or lumps, Conjunctiva normal  Cardiovascular: S1 and S2 Present, no Murmur, Respiratory: good respiratory effort, Bilateral Air entry present and CTA, no Crackles, no wheezes Abdomen: Bowel Sound present, Soft and no tenderness Extremities: no Pedal edema Neurology: alert and oriented to time, place, and person affect appropriate. no new focal deficit Gait not checked due to patient safety concerns  The results of significant diagnostics from this hospitalization (including imaging, microbiology, ancillary and laboratory) are listed below for reference.    Significant Diagnostic Studies: DG Chest 1  View  Result Date: 06/05/2021 CLINICAL DATA:  Post right thoracentesis with 400 mL removed. EXAM: CHEST  1 VIEW COMPARISON:  June 04, 2021. FINDINGS: Decreased right pleural effusion without visible residual effusion on the right. Small left pleural effusion. Overlying mild left basilar opacity. No visible pneumothorax. Similar enlarged cardiac silhouette. Central pulmonary vascular congestion. IMPRESSION: 1. Decreased right pleural effusion without visible right pleural effusion or visible pneumothorax. 2. Small left pleural effusion. Overlying mild left basilar opacity, most likely atelectasis. 3.  Cardiomegaly and central pulmonary vascular congestion. Electronically Signed   By: Margaretha Sheffield MD   On: 06/05/2021 10:58   NM Pulmonary Perfusion  Result Date: 06/04/2021 CLINICAL DATA:  Shortness of breath EXAM: NUCLEAR MEDICINE PERFUSION LUNG SCAN TECHNIQUE: Perfusion images were obtained in multiple projections after intravenous injection of radiopharmaceutical. Ventilation scans intentionally deferred if perfusion scan and chest x-ray adequate for interpretation during COVID 19 epidemic. RADIOPHARMACEUTICALS:  4.0 mCi Tc-57mMAA IV COMPARISON:  Chest x-ray from earlier in the same day FINDINGS: No S-shaped defects are noted on perfusion images to suggest pulmonary embolism. IMPRESSION: No evidence of pulmonary embolism. Electronically Signed   By: MInez CatalinaM.D.   On: 06/04/2021 14:36   [place]NAL  Result Date: 06/01/2021 CLINICAL DATA:  Acute kidney injury, history of CKD stage IV EXAM: RENAL / URINARY TRACT ULTRASOUND COMPLETE COMPARISON:  Ultrasound 03/13/2021, CT abdomen pelvis 05/12/2021 FINDINGS: Right Kidney: Renal measurements: 8.8 x 3.5 x 4.5 cm = volume: 71.2 mL. Echogenicity is within normal limits. No concerning renal mass, shadowing calculus or hydronephrosis. Left Kidney: Renal measurements: 8.3 x 4.8 x 3.7 cm = volume: 63.8 mL. Echogenicity is within normal limits. Solid echogenic 8 x  6 x 8 mm echogenic mass present in the interpolar left kidney given the appearance of macroscopic fat on comparison CT imaging and stability from multiple prior exams, favor renal angiomyolipoma. No concerning renal mass, shadowing calculus or hydronephrosis. Bladder: Appears normal for degree of bladder distention. Other: Bilateral pleural effusions. Technically challenging exam due to altered mental status and inability to reposition in decubitus IMPRESSION: Echogenic 8 mm solid lesion in the interpolar left kidney corresponding well to a region of macroscopic fat on recent comparison CT images, and stable appearance from more remote CT comparison is as well, strongly favoring renal angiomyolipoma. Could consider 6-12 month follow-up surveillance imaging. Bilateral pleural effusions. Otherwise unremarkable urinary tract ultrasound. Electronically Signed   By: PLovena LeM.D.   On: 06/01/2021 04:45   DG CHEST PORT 1 VIEW  Result Date: 06/04/2021 CLINICAL DATA:  Shortness of breath. History of congestive heart failure. EXAM: PORTABLE CHEST 1 VIEW COMPARISON:  06/02/2021 FINDINGS: Cardiomegaly. Mediastinal shadows are normal. Bilateral effusions, left larger than right, with lower lobe atelectasis and or pneumonia. Mild edema in the lungs elsewhere. IMPRESSION: Findings consistent with congestive heart failure. Enlarged cardiac silhouette. Bilateral effusions left larger than right. Atelectatic changes in the lower lungs left more than right. Mild edema pattern. Electronically Signed   By: MNelson ChimesM.D.   On: 06/04/2021 11:33   DG CHEST PORT 1 VIEW  Result Date: 06/02/2021 CLINICAL DATA:  60year old female with shortness of breath. Acute kidney injury. EXAM: PORTABLE CHEST 1 VIEW COMPARISON:  Portable chest 06/01/2021 and earlier. FINDINGS: Portable AP upright view at 1004 hours. Stable lung volumes and mediastinal contours. Cardiomegaly as demonstrated in May. Regressed but not resolved bilateral  pulmonary edema with small bilateral pleural effusions. No pneumothorax. No consolidation identified. No acute osseous abnormality identified. Negative visible bowel gas pattern. IMPRESSION: Cardiomegaly. Regressed but not resolved pulmonary edema since yesterday with small bilateral pleural effusions. Electronically Signed   By: HGenevie AnnM.D.   On: 06/02/2021 10:28   DG Chest Port 1 View  Result Date: 06/01/2021 CLINICAL DATA:  Shortness of breath. Altered mental status. Low blood sugar. EXAM: PORTABLE CHEST 1 VIEW COMPARISON:  05/12/2021 FINDINGS: Cardiac enlargement. Bilateral perihilar infiltration could represent pneumonia or edema. Small bilateral pleural effusions. Mediastinal contours appear intact. No  pneumothorax. Calcification of the aorta. IMPRESSION: Cardiac enlargement with perihilar infiltrates and bilateral pleural effusions. Electronically Signed   By: Lucienne Capers M.D.   On: 06/01/2021 01:23   ECHOCARDIOGRAM COMPLETE  Result Date: 06/03/2021    ECHOCARDIOGRAM REPORT   Patient Name:   OLYVE STROBLE Date of Exam: [id number] Medical Rec #:  [id number]      Height:       64.0 in Accession #:    [id number]     Weight:       109.8 lb Date of Birth:  January 09, 1961     BSA:          1.516 m Patient Age:    5 years       BP:           135/54 mmHg Patient Gender: F              HR:           79 bpm. Exam Location:  Inpatient Procedure: Cardiac Doppler and Color Doppler Indications:    CHF  History:        Patient has prior history of Echocardiogram examinations. CHF;                 Risk Factors:Diabetes and Hypertension.  Sonographer:    Luisa Hart RDCS Referring Phys: [phone] [facility]  Sonographer Comments: Image acquisition challenging due to uncooperative patient. IMPRESSIONS  1. Left ventricular ejection fraction, by estimation, is 55 to 60%. The left ventricle has normal function. The left ventricle has no regional wall motion abnormalities. Left ventricular diastolic parameters are  consistent with Grade II diastolic dysfunction (pseudonormalization).  2. Right ventricular systolic function is normal. The right ventricular size is normal.  3. Left atrial size was mildly dilated.  4. Right atrial size was moderately dilated.  5. The mitral valve is grossly normal. Mild mitral valve regurgitation. No evidence of mitral stenosis.  6. The aortic valve is grossly normal. Aortic valve regurgitation is not visualized. No aortic stenosis is present. Comparison(s): A prior study was performed on 11/21/20. Prior images reviewed side by side. No siginificant change. Aortic valve morphology better seen in 2021 study. FINDINGS  Left Ventricle: Left ventricular ejection fraction, by estimation, is 55 to 60%. The left ventricle has normal function. The left ventricle has no regional wall motion abnormalities. The left ventricular internal cavity size was normal in size. There is  no left ventricular hypertrophy. Left ventricular diastolic parameters are consistent with Grade II diastolic dysfunction (pseudonormalization). Right Ventricle: The right ventricular size is normal. No increase in right ventricular wall thickness. Right ventricular systolic function is normal. Left Atrium: Left atrial size was mildly dilated. Right Atrium: Right atrial size was moderately dilated. Pericardium: Trivial pericardial effusion is present. Mitral Valve: The mitral valve is grossly normal. Mild mitral valve regurgitation. No evidence of mitral valve stenosis. Tricuspid Valve: The tricuspid valve is normal in structure. Tricuspid valve regurgitation is mild . No evidence of tricuspid stenosis. Aortic Valve: The aortic valve is grossly normal. Aortic valve regurgitation is not visualized. No aortic stenosis is present. Aortic valve mean gradient measures 5.0 mmHg. Aortic valve peak gradient measures 8.8 mmHg. Aortic valve area, by VTI measures 1.70 cm. Pulmonic Valve: The pulmonic valve was normal in structure. Pulmonic  valve regurgitation is mild. No evidence of pulmonic stenosis. Aorta: The aortic root and ascending aorta are structurally normal, with no evidence of dilitation. IAS/Shunts: The atrial septum is grossly normal.  LEFT VENTRICLE  PLAX 2D LVIDd:         5.40 cm     Diastology LVIDs:         3.00 cm     LV e' medial:    4.67 cm/s LV PW:         1.00 cm     LV E/e' medial:  31.5 LV IVS:        0.90 cm     LV e' lateral:   4.28 cm/s LVOT diam:     1.80 cm     LV E/e' lateral: 34.3 LV SV:         52 LV SV Index:   34 LVOT Area:     2.54 cm  LV Volumes (MOD) LV vol d, MOD A2C: 48.5 ml LV vol d, MOD A4C: 54.8 ml LV vol s, MOD A2C: 21.4 ml LV vol s, MOD A4C: 19.6 ml LV SV MOD A2C:     27.1 ml LV SV MOD A4C:     54.8 ml LV SV MOD BP:      31.6 ml RIGHT VENTRICLE RV Basal diam:  3.70 cm RV Mid diam:    3.40 cm RV S prime:     14.40 cm/s TAPSE (M-mode): 2.5 cm LEFT ATRIUM             Index       RIGHT ATRIUM           Index LA diam:        4.50 cm 2.97 cm/m  RA Area:     20.20 cm LA Vol (A2C):   37.2 ml 24.54 ml/m RA Volume:   62.30 ml  41.09 ml/m LA Vol (A4C):   79.9 ml 52.70 ml/m LA Biplane Vol: 57.9 ml 38.19 ml/m  AORTIC VALVE                    PULMONIC VALVE AV Area (Vmax):    1.94 cm     PV Vmax:       1.35 m/s AV Area (Vmean):   1.85 cm     PV Vmean:      92.900 cm/s AV Area (VTI):     1.70 cm     PV VTI:        0.271 m AV Vmax:           148.00 cm/s  PV Peak grad:  7.3 mmHg AV Vmean:          105.000 cm/s PV Mean grad:  4.0 mmHg AV VTI:            0.304 m AV Peak Grad:      8.8 mmHg AV Mean Grad:      5.0 mmHg LVOT Vmax:         113.00 cm/s LVOT Vmean:        76.300 cm/s LVOT VTI:          0.203 m LVOT/AV VTI ratio: 0.67  AORTA Ao Root diam: 2.60 cm Ao Asc diam:  2.40 cm MITRAL VALVE                TRICUSPID VALVE MV Area (PHT): 7.16 cm     TR Peak grad:   24.2 mmHg MV Decel Time: 106 msec     TR Vmax:        246.00 cm/s MV E velocity: 147.00 cm/s MV A velocity: 55.70 cm/s   SHUNTS MV E/A ratio:  2.64  Systemic VTI:  0.20 m                             Systemic Diam: 1.80 cm Rudean Haskell MD Electronically signed by Rudean Haskell MD Signature Date/Time: 06/03/2021/4:23:54 PM    Final    [place] RT UPPER EXTREM LTD SOFT TISSUE NON VASCULAR  Result Date: 06/05/2021 CLINICAL DATA:  Right wrist swelling following IV removal EXAM: ULTRASOUND RIGHT UPPER EXTREMITY LIMITED TECHNIQUE: Ultrasound examination of the upper extremity soft tissues was performed in the area of clinical concern. COMPARISON:  None. FINDINGS: Targeted ultrasound was performed of the soft tissues of the right wrist at site of patient's clinical concern. No soft tissue edema or fluid collection. IMPRESSION: No soft tissue edema or fluid collection of the right wrist at site of patient's clinical concern. Electronically Signed   By: Davina Poke D.O.   On: 06/05/2021 16:53   ECHOCARDIOGRAM LIMITED  Result Date: 06/05/2021    ECHOCARDIOGRAM LIMITED REPORT   Patient Name:   ANALYNN BETHARD Date of Exam: [id number] Medical Rec #:  [id number]      Height:       64.0 in Accession #:    [id number]     Weight:       101.9 lb Date of Birth:  25-May-1961     BSA:          1.469 m Patient Age:    35 years       BP:           117/45 mmHg Patient Gender: F              HR:           70 bpm. Exam Location:  Inpatient Procedure: Limited Echo and Color Doppler Indications:    Bacteremia  History:        Patient has prior history of Echocardiogram examinations, most                 recent 06/03/2021. CHF; Risk Factors:Diabetes and Hypertension.  Sonographer:    Merrie Roof RDCS Referring Phys: [id number] [place]  1. Limited echo to look again for possible endocarditis.  2. Left ventricular ejection fraction, by estimation, is 55 to 60%. The left ventricle has normal function.  3. The mitral valve is abnormal. Trivial mitral valve regurgitation. Moderate mitral annular calcification.  4. The aortic valve is normal in structure. Aortic  valve regurgitation is not visualized. No aortic stenosis is present. FINDINGS  Left Ventricle: Left ventricular ejection fraction, by estimation, is 55 to 60%. The left ventricle has normal function. Mitral Valve: The mitral valve is abnormal. There is moderate thickening of the mitral valve leaflet(s). There is mild calcification of the mitral valve leaflet(s). Moderate mitral annular calcification. Trivial mitral valve regurgitation. Tricuspid Valve: The tricuspid valve is grossly normal. Tricuspid valve regurgitation is mild. There is no evidence of tricuspid valve vegetation. Aortic Valve: The aortic valve is normal in structure. Aortic valve regurgitation is not visualized. No aortic stenosis is present. There is no evidence of aortic valve vegetation. Pulmonic Valve: The pulmonic valve was not well visualized. Pulmonic valve regurgitation is not visualized. Additional Comments: Limited echo to look again for possible endocarditis. TRICUSPID VALVE TR Peak grad:   33.6 mmHg TR Vmax:        290.00 cm/s Mertie Moores MD Electronically signed by Mertie Moores MD Signature Date/Time: 06/05/2021/3:49:28 PM  Final    IR THORACENTESIS ASP PLEURAL SPACE W/IMG GUIDE  Result Date: 06/05/2021 INDICATION: History of congestive heart failure, shortness of breath. Bilateral pleural effusions. Request for diagnostic and therapeutic thoracentesis. EXAM: ULTRASOUND GUIDED RIGHT THORACENTESIS MEDICATIONS: 1% plain lidocaine, 5 mL COMPLICATIONS: None immediate. PROCEDURE: An ultrasound guided thoracentesis was thoroughly discussed with the patient and questions answered. The benefits, risks, alternatives and complications were also discussed. The patient understands and wishes to proceed with the procedure. Written consent was obtained. Imaging of the left chest demonstrates small effusion, not amenable for safe percutaneous thoracentesis. Imaging of the right chest demonstrates moderate effusion safe for percutaneous  access. Ultrasound was performed to localize and mark an adequate pocket of fluid in the right chest. The area was then prepped and draped in the normal sterile fashion. 1% Lidocaine was used for local anesthesia. Under ultrasound guidance a 6 Fr Safe-T-Centesis catheter was introduced. Thoracentesis was performed. The catheter was removed and a dressing applied. FINDINGS: A total of approximately 400 mL of clear yellow fluid was removed. Samples were sent to the laboratory as requested by the clinical team. IMPRESSION: Successful ultrasound guided right thoracentesis yielding 400 mL of pleural fluid. Read by: Ascencion Dike PA-C Electronically Signed   By: Markus Daft M.D.   On: 06/05/2021 10:59    Microbiology: Recent Results (from the past 240 hour(s))  Culture, blood (routine x 2)     Status: Abnormal   Collection Time: 06/03/21  3:49 PM   Specimen: BLOOD  Result Value Ref Range Status   Specimen Description BLOOD LEFT ANTECUBITAL  Final   Special Requests   Final    BOTTLES DRAWN AEROBIC AND ANAEROBIC Blood Culture adequate volume   Culture  Setup Time   Final    GRAM POSITIVE COCCI IN CLUSTERS ANAEROBIC BOTTLE ONLY CRITICAL RESULT CALLED TO, READ BACK BY AND VERIFIED WITHDion Body PHARMD 1911 06/04/21 A BROWNING Performed at [facility], [place] [address] [place], [place] [address]    Culture STAPHYLOCOCCUS AUREUS (A)  Final   Report Status 06/06/2021 FINAL  Final   Organism ID, Bacteria STAPHYLOCOCCUS AUREUS  Final      Susceptibility   Staphylococcus aureus - MIC*    CIPROFLOXACIN <=0.5 SENSITIVE Sensitive     ERYTHROMYCIN <=0.25 SENSITIVE Sensitive     GENTAMICIN <=0.5 SENSITIVE Sensitive     OXACILLIN 0.5 SENSITIVE Sensitive     TETRACYCLINE <=1 SENSITIVE Sensitive     VANCOMYCIN 1 SENSITIVE Sensitive     TRIMETH/SULFA <=10 SENSITIVE Sensitive     CLINDAMYCIN <=0.25 SENSITIVE Sensitive     RIFAMPIN <=0.5 SENSITIVE Sensitive     Inducible Clindamycin NEGATIVE Sensitive      * STAPHYLOCOCCUS AUREUS  Blood Culture ID Panel (Reflexed)     Status: Abnormal   Collection Time: 06/03/21  3:49 PM  Result Value Ref Range Status   Enterococcus faecalis NOT DETECTED NOT DETECTED Final   Enterococcus Faecium NOT DETECTED NOT DETECTED Final   Listeria monocytogenes NOT DETECTED NOT DETECTED Final   Staphylococcus species DETECTED (A) NOT DETECTED Final    Comment: CRITICAL RESULT CALLED TO, READ BACK BY AND VERIFIED WITHDion Body PHARMD 1911 06/04/21 A BROWNING    Staphylococcus aureus (BCID) DETECTED (A) NOT DETECTED Final    Comment: CRITICAL RESULT CALLED TO, READ BACK BY AND VERIFIED WITHDion Body PHARMD 1911 06/04/21 A BROWNING    Staphylococcus epidermidis NOT DETECTED NOT DETECTED Final   Staphylococcus lugdunensis NOT DETECTED NOT DETECTED  Final   Streptococcus species NOT DETECTED NOT DETECTED Final   Streptococcus agalactiae NOT DETECTED NOT DETECTED Final   Streptococcus pneumoniae NOT DETECTED NOT DETECTED Final   Streptococcus pyogenes NOT DETECTED NOT DETECTED Final   A.calcoaceticus-baumannii NOT DETECTED NOT DETECTED Final   Bacteroides fragilis NOT DETECTED NOT DETECTED Final   Enterobacterales NOT DETECTED NOT DETECTED Final   Enterobacter cloacae complex NOT DETECTED NOT DETECTED Final   Escherichia coli NOT DETECTED NOT DETECTED Final   Klebsiella aerogenes NOT DETECTED NOT DETECTED Final   Klebsiella oxytoca NOT DETECTED NOT DETECTED Final   Klebsiella pneumoniae NOT DETECTED NOT DETECTED Final   Proteus species NOT DETECTED NOT DETECTED Final   Salmonella species NOT DETECTED NOT DETECTED Final   Serratia marcescens NOT DETECTED NOT DETECTED Final   Haemophilus influenzae NOT DETECTED NOT DETECTED Final   Neisseria meningitidis NOT DETECTED NOT DETECTED Final   Pseudomonas aeruginosa NOT DETECTED NOT DETECTED Final   Stenotrophomonas maltophilia NOT DETECTED NOT DETECTED Final   Candida albicans NOT DETECTED NOT DETECTED Final   Candida  auris NOT DETECTED NOT DETECTED Final   Candida glabrata NOT DETECTED NOT DETECTED Final   Candida krusei NOT DETECTED NOT DETECTED Final   Candida parapsilosis NOT DETECTED NOT DETECTED Final   Candida tropicalis NOT DETECTED NOT DETECTED Final   Cryptococcus neoformans/gattii NOT DETECTED NOT DETECTED Final   Meth resistant mecA/C and MREJ NOT DETECTED NOT DETECTED Final    Comment: Performed at [facility], [address], [place], [place] [address]  Culture, blood (routine x 2)     Status: None   Collection Time: 06/03/21  4:04 PM   Specimen: BLOOD  Result Value Ref Range Status   Specimen Description BLOOD LEFT ANTECUBITAL  Final   Special Requests   Final    BOTTLES DRAWN AEROBIC AND ANAEROBIC Blood Culture adequate volume   Culture   Final    NO GROWTH 5 DAYS Performed at [facility], [place] [address], [place], [place] [address]    Report Status 06/08/2021 FINAL  Final  Body fluid culture w Gram Stain     Status: None   Collection Time: 06/05/21 10:36 AM   Specimen: Lung, Right; Pleural Fluid  Result Value Ref Range Status   Specimen Description PLEURAL FLUID  Final   Special Requests RIGHT LUNG  Final   Gram Stain   Final    MODERATE WBC PRESENT, PREDOMINANTLY MONONUCLEAR NO ORGANISMS SEEN    Culture   Final    NO GROWTH 3 DAYS Performed at [facility], [place] [address] [place], [place] [address]    Report Status 06/08/2021 FINAL  Final  Culture, blood (routine x 2)     Status: None   Collection Time: 06/06/21  5:36 AM   Specimen: BLOOD  Result Value Ref Range Status   Specimen Description BLOOD RIGHT ANTECUBITAL  Final   Special Requests   Final    BOTTLES DRAWN AEROBIC ONLY Blood Culture adequate volume   Culture   Final    NO GROWTH 5 DAYS Performed at [facility], [place] [address], [place],  [address]    Report Status 06/11/2021 FINAL  Final  Culture, blood (routine x 2)     Status: None   Collection Time: 06/06/21  5:43 AM    Specimen: BLOOD RIGHT HAND  Result Value Ref Range Status   Specimen Description BLOOD RIGHT HAND  Final   Special Requests  Final    BOTTLES DRAWN AEROBIC AND ANAEROBIC Blood Culture results may not be optimal due to an inadequate volume of blood received in culture bottles   Culture   Final    NO GROWTH 5 DAYS Performed at [facility], [place] [address], [place], [place] [address]    Report Status 06/11/2021 FINAL  Final     Labs: CBC: Recent Labs  Lab 06/06/21 0536 06/09/21 0036  WBC 5.3 4.3  HGB 8.2* 8.2*  HCT 25.5* 25.1*  MCV 91.1 88.1  PLT 156 [id number]   Basic Metabolic Panel: Recent Labs  Lab 06/07/21 0033 06/08/21 0037 06/09/21 0036 06/10/21 0016 06/11/21 0015  NA 135 135 133* 129* 129*  K 3.9 3.9 3.7 4.5 3.9  CL 93* 93* 92* 89* 92*  CO2 31 32 32 31 28  GLUCOSE 135* 78 90 228* 226*  BUN 31* 32* 30* 32* 32*  CREATININE 2.97* 2.71* 2.65* 3.32* 3.07*  CALCIUM 9.1 8.7* 8.7* 8.7* 8.8*   Liver Function Tests: No results for input(s): AST, ALT, ALKPHOS, BILITOT, PROT, ALBUMIN in the last 168 hours. CBG: Recent Labs  Lab 06/10/21 1118 06/10/21 1600 06/10/21 1629 06/11/21 0620 06/11/21 1142  GLUCAP 185* 256* 252* 146* 213*    Time spent: 35 minutes  Signed:  Berle Mull  [facility] 06/11/2021

## 2021-06-12 NOTE — Telephone Encounter (Signed)
  Notes to clinic:  review for requested change by pharmacy    Requested Prescriptions  Pending Prescriptions Disp Refills   levothyroxine (SYNTHROID) 150 MCG tablet 30 tablet 3      Endocrinology:  Hypothyroid Agents Failed - 06/12/2021  7:32 AM      Failed - TSH needs to be rechecked within 3 months after an abnormal result. Refill until TSH is due.      Passed - TSH in normal range and within 360 days    TSH  Date Value Ref Range Status  06/02/2021 1.738 0.350 - 4.500 uIU/mL Final    Comment:    Performed by a 3rd Generation assay with a functional sensitivity of <=0.01 uIU/mL. Performed at [facility], [place] [address], [place], [place] [address]   02/13/2021 3.03 0.40 - 4.50 mIU/L Final          Passed - Valid encounter within last 12 months    Recent Outpatient Visits           2 months ago Hypertensive kidney disease with chronic kidney disease stage V [facility])   [place] Karle Plumber B, MD   6 months ago Chronic diastolic congestive heart failure [facility])   [place], Charlane Ferretti, MD   7 months ago Hypoglycemia associated with diabetes [facility])   [place], Enobong, MD       Future Appointments             In 1 week Doren Custard, Melton Krebs, NP [facility], [facility]   In 2 weeks Loel Dubonnet, NP [facility], [place]   In 1 month [place] Emery Dalbert Batman, MD [place]   In 1 month [place] Emery, Dalbert Batman, MD [place]

## 2021-06-13 LAB — ALDOSTERONE + RENIN ACTIVITY W/ RATIO
ALDO / PRA Ratio: <0.2 (ref 0.0–30.0)
Aldosterone: <1 ng/dL (ref 0.0–30.0)
PRA LC/MS/MS: 5.184 ng/mL/hr (ref 0.167–5.380)

## 2021-06-15 ENCOUNTER — Other Ambulatory Visit: Payer: Self-pay

## 2021-06-18 ENCOUNTER — Encounter: Payer: Self-pay | Admitting: Cardiology

## 2021-06-19 ENCOUNTER — Other Ambulatory Visit: Payer: Self-pay | Admitting: Internal Medicine

## 2021-06-19 ENCOUNTER — Other Ambulatory Visit: Payer: Self-pay

## 2021-06-25 ENCOUNTER — Other Ambulatory Visit: Payer: Self-pay | Admitting: Internal Medicine

## 2021-06-25 ENCOUNTER — Ambulatory Visit (INDEPENDENT_AMBULATORY_CARE_PROVIDER_SITE_OTHER): Payer: Medicaid Other | Admitting: Infectious Diseases

## 2021-06-25 ENCOUNTER — Other Ambulatory Visit: Payer: Self-pay

## 2021-06-25 ENCOUNTER — Encounter: Payer: Self-pay | Admitting: Infectious Diseases

## 2021-06-25 ENCOUNTER — Other Ambulatory Visit (HOSPITAL_COMMUNITY): Payer: Medicaid Other

## 2021-06-25 DIAGNOSIS — R7881 Bacteremia: Secondary | ICD-10-CM

## 2021-06-25 DIAGNOSIS — B9561 Methicillin susceptible Staphylococcus aureus infection as the cause of diseases classified elsewhere: Secondary | ICD-10-CM

## 2021-06-25 NOTE — Telephone Encounter (Signed)
Requested medication (s) are due for refill today: yes  Requested medication (s) are on the active medication list: yes   Last refill:  05/22/2021  Future visit scheduled: yes   Notes to clinic:  this refill cannot be delegated    Requested Prescriptions  Pending Prescriptions Disp Refills   traMADol (ULTRAM) 50 MG tablet 30 tablet 0    Sig: Take 1 tablet (50 mg total) by mouth every 12 (twelve) hours as needed.      Not Delegated - Analgesics:  Opioid Agonists Failed - 06/25/2021  9:01 AM      Failed - This refill cannot be delegated      Failed - Urine Drug Screen completed in last 360 days      Passed - Valid encounter within last 6 months    Recent Outpatient Visits           2 months ago Hypertensive kidney disease with chronic kidney disease stage V [facility])   [place] Karle Plumber B, MD   6 months ago Chronic diastolic congestive heart failure [facility])   [place], Charlane Ferretti, MD   7 months ago Hypoglycemia associated with diabetes [facility])   [place], Enobong, MD       Future Appointments             Today Doren Custard, Melton Krebs, NP [facility], [facility]   In 2 weeks Ladell Pier, MD [place]   In 4 weeks Buford Dresser, MD [facility]

## 2021-06-25 NOTE — Assessment & Plan Note (Signed)
[name] is here today for routine follow-up after treatment for MSSA bacteremia in the hospital.  Transthoracic echo was negative during work up; cleared bacteremia quickly and clinically improved fast. She received a short course of intravenous cefazolin with transition to p.o. linezolid, completed antibiotics 7 days prior to today's visit.  Since stopping antibiotics she is felt very well.  No findings concerning for relapsing bacteremia.  Given she developed fevers well into her recent hospital stay her BSI was likely acquired through venipuncture/IV start.  We discussed this today during the visit.    She can return PRN but I think she has been cured of her infection.

## 2021-06-25 NOTE — Patient Instructions (Signed)
You were treated for a staph infection in your blood that we believe was probably from an IV site in the hospital.   I have good confidence that your infecion has been cured. No need for more antibotics.   If you develop any fevers/chills over the next 2 weeks or so please call our office to let us know - [phone]. I do not suspect this will be the case.   You can follow up with your regular doctor

## 2021-06-25 NOTE — Progress Notes (Signed)
Patient: [name]  DOB: [id number] MRN: [id number] PCP: Ladell Pier, MD    Subjective:  [name] is a 60 y.o. female here for follow up after recent hospitalization for hypoglycemia complicated by nosocomial MSSA bacteremia.   She completed linezolid about a week ago and has felt perfect ever since. No concern for any relapsing infectious symptoms and denies any fevers, chills, musculoskeletal pain, rashes, nausea/vomiting.   Her blood sugar is under good control and was 99 this morning.    Review of Systems  Constitutional:  Negative for chills, fever, malaise/fatigue and weight loss.  HENT:  Negative for sore throat.   Respiratory:  Negative for cough, sputum production and shortness of breath.   Cardiovascular: Negative.   Gastrointestinal:  Negative for abdominal pain, diarrhea and vomiting.  Musculoskeletal:  Negative for joint pain, myalgias and neck pain.  Skin:  Negative for rash.  Neurological:  Negative for headaches.  Psychiatric/Behavioral:  Negative for depression and substance abuse. The patient is not nervous/anxious.   All other systems reviewed and are negative.    Past Medical History:  Diagnosis Date   Adrenal insufficiency, primary ([place])    Anemia 2018   Hgb 6.8 early 11/2020.  PRBC x 1.  06/2017 PRBC x 1.     CHF (congestive heart failure) ([place]) 99991111   Diastolic   CKD (chronic kidney disease) stage 4, GFR 15-29 ml/min ([place]) 04/2021   Stage 4 as of late May 2022   Diabetes mellitus without complication ([place])    Hypertension    Hypothyroidism     Outpatient Medications Prior to Visit  Medication Sig Dispense Refill   acetaminophen (TYLENOL) 325 MG tablet Take 2 tablets (650 mg total) by mouth every 6 (six) hours as needed for mild pain.     amLODipine (NORVASC) 10 MG tablet TAKE 1 TABLET (10 MG TOTAL) BY MOUTH DAILY. 30 tablet 3   aspirin 81 MG chewable tablet Chew 81 mg by mouth daily.     atorvastatin (LIPITOR) 20 MG  tablet TAKE 1 TABLET (20 MG TOTAL) BY MOUTH DAILY. 30 tablet 3   Calcium Carbonate-Vit D-Min (CALCIUM 1200 PO) Take 1,200 mg by mouth daily.     hydrocortisone (CORTEF) 5 MG tablet Take 2 tablets (10 mg total) by mouth in the morning AND 1 tablet (5 mg total) every evening. 45 tablet 0   isosorbide mononitrate (IMDUR) 60 MG 24 hr tablet TAKE 1 TABLET BY MOUTH DAILY 90 tablet 3   levothyroxine (SYNTHROID) 150 MCG tablet TAKE 1 TABLET BY MOUTH DAILY 30 tablet 1   Multiple Vitamin (MULTIVITAMIN WITH MINERALS) TABS tablet Take 1 tablet by mouth daily.     omeprazole (PRILOSEC) 40 MG capsule TAKE 1 CAPSULE (40 MG TOTAL) BY MOUTH DAILY. 90 capsule 0   ondansetron (ZOFRAN) 4 MG tablet Take 1 tablet (4 mg total) by mouth daily as needed for nausea or vomiting. 15 tablet 0   polyethylene glycol (MIRALAX / GLYCOLAX) 17 g packet Take 17 g by mouth daily. 30 each 0   repaglinide (PRANDIN) 0.5 MG tablet TAKE 1 TABLET (0.5 MG TOTAL) BY MOUTH 2 (TWO) TIMES DAILY BEFORE A MEAL. 60 tablet 11   traMADol (ULTRAM) 50 MG tablet Take 1 tablet (50 mg total) by mouth every 12 (twelve) hours as needed. 30 tablet 0   vitamin B-12 (CYANOCOBALAMIN) 1000 MCG tablet Take 1 tablet (1,000 mcg total) by mouth daily. 30 tablet 0   torsemide (DEMADEX) 20 MG tablet  Take 1 tablet (20 mg total) by mouth daily as needed (for weight gain of 3lbs in 1 day or 5 lbs in 2 days.). (Patient not taking: Reported on 06/25/2021) 60 tablet 0   fluticasone (FLONASE) 50 MCG/ACT nasal spray PLACE 2 SPRAYS INTO BOTH NOSTRILS DAILY. 16 g 1   No facility-administered medications prior to visit.     Allergies  Allergen Reactions   Beef-Derived Products     RELIGOUS  REASONS   Pork-Derived Products     RELIGIOUS REASONS   Percocet [Oxycodone-Acetaminophen] Rash    Social History   Tobacco Use   Smoking status: Never   Smokeless tobacco: Never  Substance Use Topics   Alcohol use: No   Drug use: No    Family History  Problem Relation Age  of Onset   Diabetes Mother    Hyperlipidemia Father    Hypertension Father    Adrenal disorder Neg Hx     Objective:   Vitals:   06/25/21 0927 06/25/21 0928  BP:  (!) 180/52  Pulse:  (!) 54  Temp:  98.3 F (36.8 C)  TempSrc:  Oral  Weight: 103 lb (46.7 kg) 103 lb (46.7 kg)   Body mass index is 17.68 kg/m.  Physical Exam Constitutional:      Appearance: She is well-developed.     Comments: Seated comfortably in chair.   HENT:     Mouth/Throat:     Mouth: No oral lesions.     Dentition: Normal dentition. No dental abscesses.     Pharynx: No oropharyngeal exudate.  Cardiovascular:     Rate and Rhythm: Normal rate and regular rhythm.     Heart sounds: Normal heart sounds.  Pulmonary:     Effort: Pulmonary effort is normal.     Breath sounds: Normal breath sounds.  Abdominal:     General: There is no distension.     Palpations: Abdomen is soft.     Tenderness: There is no abdominal tenderness.  Lymphadenopathy:     Cervical: No cervical adenopathy.  Skin:    General: Skin is warm and dry.     Findings: No rash.  Neurological:     Mental Status: She is alert and oriented to person, place, and time.  Psychiatric:        Judgment: Judgment normal.     Comments: In good spirits today and engaged in care discussion    Lab Results: Lab Results  Component Value Date   WBC 4.3 06/09/2021   HGB 8.2 (L) 06/09/2021   HCT 25.1 (L) 06/09/2021   MCV 88.1 06/09/2021   PLT 187 06/09/2021    Lab Results  Component Value Date   CREATININE 3.07 (H) 06/11/2021   BUN 32 (H) 06/11/2021   NA 129 (L) 06/11/2021   K 3.9 06/11/2021   CL 92 (L) 06/11/2021   CO2 28 06/11/2021    Lab Results  Component Value Date   ALT 17 06/04/2021   AST 51 (H) 06/04/2021   ALKPHOS 88 06/04/2021   BILITOT 1.4 (H) 06/04/2021     Assessment & Plan:   Problem List Items Addressed This Visit       Unprioritized   Bacteremia due to methicillin susceptible Staphylococcus aureus (MSSA)     Ms. Dunckel is here today for routine follow-up after treatment for MSSA bacteremia in the hospital.  Transthoracic echo was negative during work up; cleared bacteremia quickly and clinically improved fast. She received a short course of intravenous cefazolin  with transition to p.o. linezolid, completed antibiotics 7 days prior to today's visit.  Since stopping antibiotics she is felt very well.  No findings concerning for relapsing bacteremia.  Given she developed fevers well into her recent hospital stay her BSI was likely acquired through venipuncture/IV start.  We discussed this today during the visit.    She can return PRN but I think she has been cured of her infection.         Janene Madeira, MSN, NP-C [facility] for Infectious [place] Pager: [phone] Office: [phone]  06/25/21  9:54 AM

## 2021-06-26 ENCOUNTER — Ambulatory Visit (HOSPITAL_BASED_OUTPATIENT_CLINIC_OR_DEPARTMENT_OTHER): Payer: Medicaid Other | Admitting: Family

## 2021-06-27 MED ORDER — TRAMADOL HCL 50 MG PO TABS
50.0000 mg | ORAL_TABLET | Freq: Two times a day (BID) | ORAL | 0 refills | Status: DC | PRN
  Filled 2021-06-27: qty 30, 15d supply, fill #0

## 2021-06-29 ENCOUNTER — Other Ambulatory Visit: Payer: Self-pay

## 2021-07-02 ENCOUNTER — Other Ambulatory Visit: Payer: Self-pay

## 2021-07-02 ENCOUNTER — Encounter: Payer: Self-pay | Admitting: Internal Medicine

## 2021-07-02 ENCOUNTER — Other Ambulatory Visit: Payer: Self-pay | Admitting: Internal Medicine

## 2021-07-02 DIAGNOSIS — E2740 Unspecified adrenocortical insufficiency: Secondary | ICD-10-CM

## 2021-07-02 MED ORDER — HYDROCORTISONE 5 MG PO TABS: ORAL_TABLET | ORAL | 0 refills | Status: DC

## 2021-07-06 ENCOUNTER — Other Ambulatory Visit: Payer: Self-pay

## 2021-07-13 DIAGNOSIS — N184 Chronic kidney disease, stage 4 (severe): Secondary | ICD-10-CM | POA: Diagnosis not present

## 2021-07-13 DIAGNOSIS — I701 Atherosclerosis of renal artery: Secondary | ICD-10-CM | POA: Diagnosis not present

## 2021-07-13 DIAGNOSIS — I129 Hypertensive chronic kidney disease with stage 1 through stage 4 chronic kidney disease, or unspecified chronic kidney disease: Secondary | ICD-10-CM | POA: Diagnosis not present

## 2021-07-13 DIAGNOSIS — E1122 Type 2 diabetes mellitus with diabetic chronic kidney disease: Secondary | ICD-10-CM | POA: Diagnosis not present

## 2021-07-13 DIAGNOSIS — D649 Anemia, unspecified: Secondary | ICD-10-CM | POA: Diagnosis not present

## 2021-07-13 DIAGNOSIS — N2581 Secondary hyperparathyroidism of renal origin: Secondary | ICD-10-CM | POA: Diagnosis not present

## 2021-07-14 ENCOUNTER — Other Ambulatory Visit: Payer: Self-pay

## 2021-07-14 ENCOUNTER — Ambulatory Visit: Payer: Medicaid Other | Attending: Internal Medicine | Admitting: Internal Medicine

## 2021-07-14 ENCOUNTER — Encounter: Payer: Self-pay | Admitting: Internal Medicine

## 2021-07-14 VITALS — BP 206/63 | HR 57 | Resp 16 | Wt 104.8 lb

## 2021-07-14 DIAGNOSIS — N184 Chronic kidney disease, stage 4 (severe): Secondary | ICD-10-CM

## 2021-07-14 DIAGNOSIS — I5032 Chronic diastolic (congestive) heart failure: Secondary | ICD-10-CM

## 2021-07-14 DIAGNOSIS — M1731 Unilateral post-traumatic osteoarthritis, right knee: Secondary | ICD-10-CM

## 2021-07-14 DIAGNOSIS — Z09 Encounter for follow-up examination after completed treatment for conditions other than malignant neoplasm: Secondary | ICD-10-CM

## 2021-07-14 DIAGNOSIS — E1122 Type 2 diabetes mellitus with diabetic chronic kidney disease: Secondary | ICD-10-CM

## 2021-07-14 DIAGNOSIS — M25551 Pain in right hip: Secondary | ICD-10-CM

## 2021-07-14 DIAGNOSIS — E2740 Unspecified adrenocortical insufficiency: Secondary | ICD-10-CM

## 2021-07-14 DIAGNOSIS — E119 Type 2 diabetes mellitus without complications: Secondary | ICD-10-CM

## 2021-07-14 DIAGNOSIS — I129 Hypertensive chronic kidney disease with stage 1 through stage 4 chronic kidney disease, or unspecified chronic kidney disease: Secondary | ICD-10-CM

## 2021-07-14 DIAGNOSIS — G8929 Other chronic pain: Secondary | ICD-10-CM

## 2021-07-14 DIAGNOSIS — Z79899 Other long term (current) drug therapy: Secondary | ICD-10-CM

## 2021-07-14 LAB — GLUCOSE, POCT (MANUAL RESULT ENTRY): POC Glucose: 147 mg/dl — AB (ref 70–99)

## 2021-07-14 MED ORDER — HYDRALAZINE HCL 10 MG PO TABS
10.0000 mg | ORAL_TABLET | Freq: Three times a day (TID) | ORAL | 2 refills | Status: DC
Start: 2021-07-14 — End: 2021-07-27
  Filled 2021-07-14: qty 60, 20d supply, fill #0
  Filled 2021-07-27: qty 60, 20d supply, fill #1

## 2021-07-14 NOTE — Progress Notes (Signed)
Patient ID: [name], female    DOB: 1961/06/14  MRN: [id number]  CC: hosp f/u    Subjective: [name] is a 60 y.o. female who presents for hosp f/u.  Daughter is with her Her concerns today include:  [name] is a 60 year old female with a history of chronic adrenal insufficiency (followed by Dr. Loanne Drilling), HTN, DM type II, stage 4 CKD, diastolic CHF (newly diagnosed with EF of 65 to 70%, followed by Dr. Harrell Gave), hypothyroid, GERD   Patient was hospitalized 6/13-23/2022 with bacteremia due to MSSA acute hypoxic respiratory failure and hypoglycemia.  MSSA grew in a single bottle.  She was seen by ID and treated with Ancef.  Discharged home on linezolid.  She had bilateral pleural effusion.  Underwent thoracentesis with 4 mL of fluid removed from the left lung.  Culture negative.  VQ scan was negative for PE.   Respiratory status improved and she was able to be discharged without oxygen.  She did have some acute on chronic CKD with creatinine at presentation of 4 with her baseline being around 2.5.  She was found to have low cortisol level of 2.5 on admission and cosyntropin stimulation test was positive.  She was restarted on Solu-Cortef 10 mg twice a day.  Told to follow-up with her endocrinologist.  She has anemia of chronic disease.  Hemoglobin previously was in the sevens.  At the time of discharge hemoglobin was 8.2.  Today: Since discharge, patient states she is doing better.  She has followed up with ID and has completed the linezolid.  CKD 4/HTN/CHF: Saw her nephrologist Dr. Johnney Ou yesterday.  Daughter reports her blood pressure was elevated at 190/58.  No changes were made in her blood pressure medication.  Patient has bilateral renal artery stenosis worse on the right.  Daughter tells me that the nephrologist did some blood test yesterday and is thinking about having patient have a procedure done for the renal artery stenosis. -Patient has mild swelling in her feet.   She takes torsemide as needed if her weight increases greater than 2 pounds overnight or greater than 5 pounds within 2 days.  Reports compliance with Norvasc and isosorbide.  Metoprolol and hydralazine were discontinued on discharge from the hospital.  Daughter states her blood pressure was running lower in the hospital.  Adrenal insufficiency.  She continues to take the steroids.  I submitted a referral for them to follow-up with Dr. Loanne Drilling.  However we found out that they need to call to schedule the appointment since she is already a patient there.  DM in regards to her diabetes: She reports blood sugars before breakfast have been running in the 80s to 90s.  She had 1 episode where blood sugar was 40 2 in the morning and she did feel it and was able to eat something.  Blood sugars before dinner range 100-110 and after dinner, ranges 120-130.  Patient reports pain in the right hip and right knee.  She has known arthritis in the right knee.  I will prescribe some tramadol for her to use as needed until she saw me today.  It is prescribed to be taken every 12 hours as needed.  However she has been taking it just once a day because by the time 12 hours, around she is asleep.  However she feels she would benefit from being able to take the second dose before bedtime.  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   Hypokalemia 05/12/2021   Fecal impaction ([place]) 05/12/2021   Anemia due to chronic kidney disease 05/12/2021   Adrenal  insufficiency (Addison's disease) ([place]) 05/12/2021   CKD (chronic kidney disease) stage 4, GFR 15-29 ml/min ([place]) 11/21/2020   HTN (hypertension) 11/21/2020   New onset of congestive heart failure ([place]) 11/21/2020   Adrenal insufficiency ([place]) 03/27/2018   Dehydration 12/09/2017   Abdominal pain, bilateral lower quadrant 12/09/2017   Hypoglycemia 07/08/2017   Hypoglycemia associated with diabetes ([place]) 05/10/2017   Malnutrition of moderate degree 05/10/2017     Current Outpatient Medications on File Prior to Visit  Medication Sig Dispense Refill   acetaminophen (TYLENOL) 325 MG tablet Take 2 tablets (650 mg total) by mouth every 6 (six) hours as needed for mild pain.     amLODipine (NORVASC) 10 MG tablet TAKE 1 TABLET (10 MG TOTAL) BY MOUTH DAILY. 30 tablet 3   aspirin 81 MG chewable tablet Chew 81 mg by mouth daily.     atorvastatin (LIPITOR) 20 MG tablet TAKE 1 TABLET (20 MG TOTAL) BY MOUTH DAILY. 30 tablet 3   Calcium Carbonate-Vit D-Min (CALCIUM 1200 PO) Take 1,200 mg by mouth daily.     hydrocortisone (CORTEF) 5 MG tablet Take 2 tablets (10 mg total) by mouth in the morning AND 1 tablet (5 mg total) every evening. 45 tablet 0   isosorbide mononitrate (IMDUR) 60 MG 24 hr tablet TAKE 1 TABLET BY MOUTH DAILY 90 tablet 3   levothyroxine (SYNTHROID) 150 MCG tablet TAKE 1 TABLET BY MOUTH DAILY BEFORE BREAKFAST. 30 tablet 1   Multiple Vitamin (MULTIVITAMIN WITH MINERALS) TABS tablet Take 1 tablet by mouth daily.     omeprazole (PRILOSEC) 40 MG capsule TAKE 1 CAPSULE (40 MG TOTAL) BY MOUTH DAILY. 90 capsule 0   ondansetron (ZOFRAN) 4 MG tablet Take 1 tablet (4 mg total) by mouth daily as needed for nausea or vomiting. 15 tablet 0   polyethylene glycol (MIRALAX / GLYCOLAX) 17 g packet Take 17 g by mouth daily. 30 each 0   repaglinide (PRANDIN) 0.5 MG tablet TAKE 1 TABLET (0.5 MG TOTAL) BY MOUTH 2 (TWO) TIMES DAILY BEFORE A MEAL. 60 tablet 11   torsemide (DEMADEX) 20 MG tablet Take 1 tablet (20  mg total) by mouth daily as needed (for weight gain of 3lbs in 1 day or 5 lbs in 2 days.). (Patient not taking: Reported on 06/25/2021) 60 tablet 0   traMADol (ULTRAM) 50 MG tablet Take 1 tablet (50 mg total) by mouth every 12 (twelve) hours as needed. 30 tablet 0   vitamin B-12 (CYANOCOBALAMIN) 1000 MCG tablet Take 1 tablet (1,000 mcg total) by mouth daily. 30 tablet 0   No current facility-administered medications on file prior to visit.    Allergies  Allergen Reactions   Beef-Derived Products     RELIGOUS  REASONS   Pork-Derived Products     RELIGIOUS REASONS   Percocet [Oxycodone-Acetaminophen] Rash    Social History   Socioeconomic History   Marital status: Single    Spouse name: Not on file   Number of children: Not on file  Years of education: Not on file   Highest education level: Not on file  Occupational History   Not on file  Tobacco Use   Smoking status: Never   Smokeless tobacco: Never  Substance and Sexual Activity   Alcohol use: No   Drug use: No   Sexual activity: Not on file  Other Topics Concern   Not on file  Social History Narrative   Not on file   Social Determinants of Health   Financial Resource Strain: Not on file  Food Insecurity: Not on file  Transportation Needs: Not on file  Physical Activity: Not on file  Stress: Not on file  Social Connections: Not on file  Intimate Partner Violence: Not on file    Family History  Problem Relation Age of Onset   Diabetes Mother    Hyperlipidemia Father    Hypertension Father    Adrenal disorder Neg Hx     Past Surgical History:  Procedure Laterality Date   CESAREAN SECTION     IR THORACENTESIS ASP PLEURAL SPACE W/IMG GUIDE  06/05/2021    ROS: Review of Systems Negative except as stated above  PHYSICAL EXAM: BP (!) 206/63 (BP Location: Left Arm, Patient Position: Sitting, Cuff Size: Small)   Pulse (!) 57   Resp 16   Wt 104 lb 12.8 oz (47.5 kg)   SpO2 100%   BMI 17.99 kg/m   Wt  Readings from Last 3 Encounters:  07/14/21 104 lb 12.8 oz (47.5 kg)  06/25/21 103 lb (46.7 kg)  06/11/21 98 lb (44.5 kg)  Repeat blood pressure 200/50.  Physical Exam  General appearance - alert, well appearing, older female and in no distress Mental status - normal mood, behavior, speech, dress, motor activity, and thought processes Neck - supple, no significant adenopathy Chest -breath sounds slightly decreased bilaterally at the bases.  No wheezes or crackles heard.   Heart - Regular rate and rhythm.  Positive S4 Musculoskeletal -mild joint enlargement of the right knee joint.  Positive crepitus on passive movement of both joints. Extremities -trace to 1+ edema of both feet.  Trace to 1+ edema of both lower extremities.   CMP Latest Ref Rng & Units 06/11/2021 06/10/2021 06/09/2021  Glucose 70 - 99 mg/dL 226(H) 228(H) 90  BUN 8 - 23 mg/dL 32(H) 32(H) 30(H)  Creatinine 0.44 - 1.00 mg/dL 3.07(H) 3.32(H) 2.65(H)  Sodium 135 - 145 mmol/L 129(L) 129(L) 133(L)  Potassium 3.5 - 5.1 mmol/L 3.9 4.5 3.7  Chloride 98 - 111 mmol/L 92(L) 89(L) 92(L)  CO2 22 - 32 mmol/L 28 31 32  Calcium 8.9 - 10.3 mg/dL 8.8(L) 8.7(L) 8.7(L)  Total Protein 6.5 - 8.1 g/dL - - -  Total Bilirubin 0.3 - 1.2 mg/dL - - -  Alkaline Phos 38 - 126 U/L - - -  AST 15 - 41 U/L - - -  ALT 0 - 44 U/L - - -   Lipid Panel     Component Value Date/Time   CHOL 218 (H) 06/13/2011 0810   TRIG 85 06/13/2011 0810   HDL 65 06/13/2011 0810   CHOLHDL 3.4 06/13/2011 0810   VLDL 17 06/13/2011 0810   LDLCALC 136 (H) 06/13/2011 0810    CBC    Component Value Date/Time   WBC 4.3 06/09/2021 0036   RBC 2.85 (L) 06/09/2021 0036   HGB 8.2 (L) 06/09/2021 0036   HCT 25.1 (L) 06/09/2021 0036   PLT 187 06/09/2021 0036   MCV 88.1 06/09/2021 0036  MCH 28.8 06/09/2021 0036   MCHC 32.7 06/09/2021 0036   RDW 13.3 06/09/2021 0036   LYMPHSABS 1.2 06/04/2021 0021   MONOABS 0.5 06/04/2021 0021   EOSABS 0.1 06/04/2021 0021   BASOSABS  0.0 06/04/2021 0021    ASSESSMENT AND PLAN: 1. Hospital discharge follow-up   2. Hypertensive kidney disease with chronic kidney disease stage IV (HCC) Blood pressure not at goal.  I will restart hydralazine at a low dose of 10 mg.  She has an appointment with a cardiologist on the eighth of next month.  Blood pressure can be rechecked on that visit.  Advised that she continues to check blood pressure at home with goal being 130/80 or lower.  - hydrALAZINE (APRESOLINE) 10 MG tablet; Take 1 tablet (10 mg total) by mouth 3 (three) times daily.  Dispense: 60 tablet; Refill: 2  3. Adrenal insufficiency [facility]) Daughter will call Dr. Cordelia Pen office to schedule a follow-up appointment.  In the meantime she will continue the Cortef  4. Post-traumatic osteoarthritis of right knee 5. Hip pain, chronic, right We will get x-rays of the right hip. She is tolerating the tramadol.  I told her that she can take it twice a day in the morning and in the evening.  She does not have to wait for 12 hours. -Went over with her that it is a controlled substance.  Discussed our controlled substance prescribing agreement with her and she is agreeable to it.  She is also agreeable to random urine drug screen checks. - DG Hip Unilat W OR W/O Pelvis 2-3 Views Right; Future  6. Chronic diastolic congestive heart failure ([place]) Patient with weight increase and some swelling in the lower extremities.  Advised that she take torsemide today.  7. Diabetes mellitus type 2, noninsulin dependent ([place]) Reported home blood sugar readings are good.  Hypoglycemic episodes are infrequent. - POCT glucose (manual entry)  8. Controlled substance agreement signed [id number] 11+Oxyco+Alc+Crt-Bund      Patient was given the opportunity to ask questions.  Patient verbalized understanding of the plan and was able to repeat key elements of the plan.   Orders Placed This Encounter  Procedures   DG Hip Unilat W OR W/O Pelvis 2-3  Views Right   [id number] 11+Oxyco+Alc+Crt-Bund   POCT glucose (manual entry)     Requested Prescriptions   Signed Prescriptions Disp Refills   hydrALAZINE (APRESOLINE) 10 MG tablet 60 tablet 2    Sig: Take 1 tablet (10 mg total) by mouth 3 (three) times daily.    Return in about 3 months (around 10/14/2021).  Karle Plumber, MD, FACP

## 2021-07-14 NOTE — Patient Instructions (Signed)
Please remember to call Dr. Cordelia Pen office to schedule an appointment. You can go to the radiology department at [facility] at any time to get the x-ray done on the right hip. Start hydralazine 10 mg twice a day.  Continue to monitor blood pressure.  Keep the appointment with the cardiologist on the eighth of next month with a blood pressure can be rechecked. Take a dose of torsemide today as you do have some swelling in the legs and feet.

## 2021-07-15 ENCOUNTER — Other Ambulatory Visit: Payer: Self-pay

## 2021-07-15 ENCOUNTER — Other Ambulatory Visit: Payer: Self-pay | Admitting: Family Medicine

## 2021-07-15 DIAGNOSIS — I5032 Chronic diastolic (congestive) heart failure: Secondary | ICD-10-CM

## 2021-07-15 MED ORDER — ATORVASTATIN CALCIUM 20 MG PO TABS
ORAL_TABLET | Freq: Every day | ORAL | 3 refills | Status: DC
  Filled 2021-07-15: qty 30, 30d supply, fill #0
  Filled 2021-08-31 – 2021-09-07 (×2): qty 30, 30d supply, fill #1

## 2021-07-15 MED FILL — Repaglinide Tab 0.5 MG: ORAL | 3 days supply | Qty: 6 | Fill #3 | Status: AC

## 2021-07-15 MED FILL — Isosorbide Mononitrate Tab ER 24HR 60 MG: ORAL | 30 days supply | Qty: 30 | Fill #3 | Status: CN

## 2021-07-16 ENCOUNTER — Encounter: Payer: Self-pay | Admitting: Internal Medicine

## 2021-07-17 ENCOUNTER — Encounter: Payer: Self-pay | Admitting: Internal Medicine

## 2021-07-17 ENCOUNTER — Other Ambulatory Visit: Payer: Self-pay

## 2021-07-17 DIAGNOSIS — E119 Type 2 diabetes mellitus without complications: Secondary | ICD-10-CM

## 2021-07-19 LAB — DRUG SCREEN 764883 11+OXYCO+ALC+CRT-BUND
Amphetamines, Urine: NEGATIVE ng/mL
BENZODIAZ UR QL: NEGATIVE ng/mL
Barbiturate: NEGATIVE ng/mL
Cannabinoid Quant, Ur: NEGATIVE ng/mL
Cocaine (Metabolite): NEGATIVE ng/mL
Creatinine: 86.1 mg/dL (ref 20.0–300.0)
Ethanol: NEGATIVE %
Meperidine: NEGATIVE ng/mL
Methadone Screen, Urine: NEGATIVE ng/mL
OPIATE SCREEN URINE: NEGATIVE ng/mL
Oxycodone/Oxymorphone, Urine: NEGATIVE ng/mL
Phencyclidine: NEGATIVE ng/mL
Propoxyphene: NEGATIVE ng/mL
pH, Urine: 5.4 (ref 4.5–8.9)

## 2021-07-19 LAB — TRAMADOL GC/MS, URINE
Tramadol gc/ms Conf: 1705 ng/mL
Tramadol: POSITIVE — AB

## 2021-07-19 MED ORDER — FREESTYLE LIBRE SENSOR SYSTEM MISC: 12 refills | Status: DC

## 2021-07-19 MED ORDER — FREESTYLE LIBRE READER DEVI: 1.0000 | Freq: Once | 0 refills | Status: AC

## 2021-07-21 ENCOUNTER — Encounter: Payer: Self-pay | Admitting: Internal Medicine

## 2021-07-21 DIAGNOSIS — E038 Other specified hypothyroidism: Secondary | ICD-10-CM

## 2021-07-22 ENCOUNTER — Other Ambulatory Visit: Payer: Self-pay

## 2021-07-22 ENCOUNTER — Encounter: Payer: Self-pay | Admitting: Internal Medicine

## 2021-07-22 MED ORDER — LEVOTHYROXINE SODIUM 150 MCG PO TABS
ORAL_TABLET | ORAL | 6 refills | Status: DC
Start: 2021-07-22 — End: 2021-10-16
  Filled 2021-07-22: qty 30, 30d supply, fill #0
  Filled 2021-08-31 – 2021-09-07 (×2): qty 30, 30d supply, fill #1

## 2021-07-22 MED ORDER — TRAMADOL HCL 50 MG PO TABS
50.0000 mg | ORAL_TABLET | Freq: Two times a day (BID) | ORAL | 0 refills | Status: DC | PRN
  Filled 2021-07-22: qty 60, 30d supply, fill #0

## 2021-07-22 NOTE — Progress Notes (Signed)
Patient ID: [name], female   DOB: [id number], 60 y.o.   MRN: [id number] Seen by Dr. Johnney Ou 07/13/2021 Creat 1.75, GFR 33 Hb 8.6, iron sat 17%,  Started on Iron 325 mg BID Minor proteinuria of 109. 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  Plan to refer to [facility] to see if we should consider stenting.

## 2021-07-24 ENCOUNTER — Other Ambulatory Visit: Payer: Self-pay

## 2021-07-24 ENCOUNTER — Ambulatory Visit (INDEPENDENT_AMBULATORY_CARE_PROVIDER_SITE_OTHER): Payer: Medicaid Other | Admitting: Cardiology

## 2021-07-24 ENCOUNTER — Encounter (HOSPITAL_BASED_OUTPATIENT_CLINIC_OR_DEPARTMENT_OTHER): Payer: Self-pay | Admitting: Cardiology

## 2021-07-24 VITALS — BP 202/58 | HR 51 | Ht 64.0 in | Wt 105.4 lb

## 2021-07-24 DIAGNOSIS — I5032 Chronic diastolic (congestive) heart failure: Secondary | ICD-10-CM

## 2021-07-24 DIAGNOSIS — I10 Essential (primary) hypertension: Secondary | ICD-10-CM

## 2021-07-24 DIAGNOSIS — E119 Type 2 diabetes mellitus without complications: Secondary | ICD-10-CM

## 2021-07-24 DIAGNOSIS — Z7189 Other specified counseling: Secondary | ICD-10-CM

## 2021-07-24 DIAGNOSIS — N184 Chronic kidney disease, stage 4 (severe): Secondary | ICD-10-CM | POA: Diagnosis not present

## 2021-07-24 NOTE — Progress Notes (Signed)
Cardiology Office Note:    Date:  08/06/2021   ID:  [name], DOB [id number], MRN [id number]  PCP:  Ladell Pier, MD  Cardiologist:  Buford Dresser, MD  Referring MD: Ladell Pier, MD   CC: Follow up evaluation for diastolic heart failure.  History of Present Illness:    [name] is a 60 y.o. female with a hx of hypertension, type II diabetes, chronic kidney disease who is seen for follow-up. I initially met her 12/26/2020 as a new consult at the request of Ladell Pier, MD for the evaluation and management of diastolic heart failure.  Today: Here with her daughter today. Feeling improved overall, but blood pressures recently have been very elevated. Reviewed options for medication adjustment, see below.  Denies chest pain, shortness of breath at rest. Dyspnea on exertion somewhat improved. No PND, orthopnea, change in LE edema or unexpected weight gain. No syncope or palpitations.   Past Medical History:  Diagnosis Date   Adrenal insufficiency, primary ([place])    Anemia 2018   Hgb 6.8 early 11/2020.  PRBC x 1.  06/2017 PRBC x 1.     CHF (congestive heart failure) ([place]) 01/7252   Diastolic   CKD (chronic kidney disease) stage 4, GFR 15-29 ml/min ([place]) 04/2021   Stage 4 as of late May 2022   Diabetes mellitus without complication ([place])    Hypertension    Hypothyroidism     Past Surgical History:  Procedure Laterality Date   CESAREAN SECTION     IR THORACENTESIS ASP PLEURAL SPACE W/IMG GUIDE  06/05/2021    Current Medications: Current Outpatient Medications on File Prior to Visit  Medication Sig   acetaminophen (TYLENOL) 325 MG tablet Take 2 tablets (650 mg total) by mouth every 6 (six) hours as needed for mild pain.   amLODipine (NORVASC) 10 MG tablet TAKE 1 TABLET (10 MG TOTAL) BY MOUTH DAILY.   aspirin 81 MG chewable tablet Chew 81 mg by mouth daily.   atorvastatin (LIPITOR) 20 MG tablet TAKE 1 TABLET (20 MG TOTAL) BY MOUTH DAILY.    Calcium Carbonate-Vit D-Min (CALCIUM 1200 PO) Take 1,200 mg by mouth daily.   Continuous Blood Gluc Sensor (FREESTYLE LIBRE SENSOR SYSTEM) MISC Change sensor Q 2 wks   hydrocortisone (CORTEF) 5 MG tablet Take 2 tablets (10 mg total) by mouth in the morning AND 1 tablet (5 mg total) every evening.   levothyroxine (SYNTHROID) 150 MCG tablet TAKE 1 TABLET BY MOUTH DAILY BEFORE BREAKFAST.   Multiple Vitamin (MULTIVITAMIN WITH MINERALS) TABS tablet Take 1 tablet by mouth daily.   omeprazole (PRILOSEC) 40 MG capsule TAKE 1 CAPSULE (40 MG TOTAL) BY MOUTH DAILY.   ondansetron (ZOFRAN) 4 MG tablet Take 1 tablet (4 mg total) by mouth daily as needed for nausea or vomiting.   polyethylene glycol (MIRALAX / GLYCOLAX) 17 g packet Take 17 g by mouth daily.   repaglinide (PRANDIN) 0.5 MG tablet TAKE 1 TABLET (0.5 MG TOTAL) BY MOUTH 2 (TWO) TIMES DAILY BEFORE A MEAL.   torsemide (DEMADEX) 20 MG tablet Take 1 tablet (20 mg total) by mouth daily as needed (for weight gain of 3lbs in 1 day or 5 lbs in 2 days.).   traMADol (ULTRAM) 50 MG tablet Take 1 tablet (50 mg total) by mouth 2 (two) times daily as needed.   vitamin B-12 (CYANOCOBALAMIN) 1000 MCG tablet Take 1 tablet (1,000 mcg total) by mouth daily.   No current facility-administered medications on file prior to  visit.     Allergies:   Beef-derived products, Pork-derived products, and Percocet [oxycodone-acetaminophen]   Social History   Tobacco Use   Smoking status: Never   Smokeless tobacco: Never  Substance Use Topics   Alcohol use: No   Drug use: No    Family History: family history includes Diabetes in her mother; Hyperlipidemia in her father; Hypertension in her father. There is no history of Adrenal disorder.  ROS:   Please see the history of present illness. All other systems are reviewed and negative.   EKGs/Labs/Other Studies Reviewed:    The following studies were reviewed today: Echo 06/05/21  1. Limited echo to look again for  possible endocarditis.   2. Left ventricular ejection fraction, by estimation, is 55 to 60%. The  left ventricle has normal function.   3. The mitral valve is abnormal. Trivial mitral valve regurgitation.  Moderate mitral annular calcification.   4. The aortic valve is normal in structure. Aortic valve regurgitation is  not visualized. No aortic stenosis is present.  Echo 06/03/21  1. Left ventricular ejection fraction, by estimation, is 55 to 60%. The  left ventricle has normal function. The left ventricle has no regional  wall motion abnormalities. Left ventricular diastolic parameters are  consistent with Grade II diastolic  dysfunction (pseudonormalization).   2. Right ventricular systolic function is normal. The right ventricular  size is normal.   3. Left atrial size was mildly dilated.   4. Right atrial size was moderately dilated.   5. The mitral valve is grossly normal. Mild mitral valve regurgitation.  No evidence of mitral stenosis.   6. The aortic valve is grossly normal. Aortic valve regurgitation is not  visualized. No aortic stenosis is present.   US Renal Artery Duplex 03/13/2021: FINDINGS: Right Kidney: Length: 9.6 cm. Echogenicity within normal limits. No mass or hydronephrosis visualized.   Left Kidney: Length: 8.8 cm. Echogenicity within normal limits. No mass or hydronephrosis visualized.   Bladder:  Unremarkable   RENAL DUPLEX ULTRASOUND Right Renal Artery Velocities: Origin:  347 cm/sec Mid:  213 cm/sec [place]:  130 cm/sec Interlobar:  33 cm/sec Arcuate:  22 cm/sec   Left Renal Artery Velocities: Origin:  Obscured by shadowing bowel gas Mid:  251 cm/sec [place]:  148 cm/sec Interlobar:  41 cm/sec Arcuate:  23 cm/sec Aortic Velocity:  108 cm/sec  Right Renal-Aortic Ratios: Origin: 3.2 Mid:  2.0 Hilum: 1.2 Interlobar: 0.3 Arcuate: 0.2  Left Renal-Aortic Ratios: Origin: Not visualized Mid: 2.3 [place]: 1.4 Interlobar: 0.4 Arcuate: 0.2    Renal veins are patent. Atherosclerotic changes seen throughout the infrarenal abdominal aorta.   IMPRESSION: Doppler findings suspicious for bilateral renal artery stenosis, worse on the right.  Echo 11/21/20 1. Left ventricular ejection fraction, by estimation, is 65 to 70%. The  left ventricle has normal function. The left ventricle has no regional  wall motion abnormalities. Left ventricular diastolic parameters are  consistent with Grade II diastolic  dysfunction (pseudonormalization).   2. Right ventricular systolic function is normal. The right ventricular  size is normal. There is mildly elevated pulmonary artery systolic  pressure.   3. The mitral valve is grossly normal. Mild mitral valve regurgitation.  No evidence of mitral stenosis.   4. The aortic valve is normal in structure. Aortic valve regurgitation is  not visualized. No aortic stenosis is present.   5. The inferior vena cava is normal in size with greater than 50%  respiratory variability, suggesting right atrial pressure of 3 mmHg.  EKG:  EKG is personally reviewed.   07/24/2021: Sinus bradycardia at 51 bpm, LVH 05/27/2021: EKG is not ordered today. 12/26/2020: sinus bradycardia at 57 bpm  Recent Labs: 05/16/2021: Magnesium 2.3 06/01/2021: B Natriuretic Peptide 1,106.6 06/02/2021: TSH 1.738 06/04/2021: ALT 17 06/09/2021: Hemoglobin 8.2; Platelets 187 06/11/2021: BUN 32; Creatinine, Ser 3.07; Potassium 3.9; Sodium 129  Recent Lipid Panel    Component Value Date/Time   CHOL 218 (H) 06/13/2011 0810   TRIG 85 06/13/2011 0810   HDL 65 06/13/2011 0810   CHOLHDL 3.4 06/13/2011 0810   VLDL 17 06/13/2011 0810   LDLCALC 136 (H) 06/13/2011 0810    Physical Exam:    VS:  BP (!) 202/58   Pulse (!) 51   Ht $R'5\' 4"'cZ$  (1.626 m)   Wt 105 lb 6.4 oz (47.8 kg)   SpO2 99%   BMI 18.09 kg/m     Wt Readings from Last 3 Encounters:  07/24/21 105 lb 6.4 oz (47.8 kg)  07/14/21 104 lb 12.8 oz (47.5 kg)  06/25/21 103 lb (46.7  kg)    GEN: Well nourished, well developed in no acute distress HEENT: Normal, moist mucous membranes NECK: No JVD CARDIAC: regular rhythm, normal S1 and S2, no rubs or gallops. 2/6 systolic murmur. VASCULAR: Radial and DP pulses 2+ bilaterally. No carotid bruits. +renal bruit RESPIRATORY:  Clear to auscultation without rales, wheezing or rhonchi  ABDOMEN: Soft, non-tender, non-distended MUSCULOSKELETAL:  Ambulates independently SKIN: Warm and dry, trace bilateral LE edema NEUROLOGIC:  Alert and oriented x 3. No focal neuro deficits noted. PSYCHIATRIC:  Normal affect    ASSESSMENT:    1. Primary hypertension   2. Chronic diastolic heart failure ([place])   3. CKD (chronic kidney disease) stage 4, GFR 15-29 ml/min ([facility])   4. Type 2 diabetes mellitus without obesity ([facility])   5. Cardiac risk counseling   6. Counseling on health promotion and disease prevention     PLAN:    Chronic diastolic heart failure, with resolution of acute diastolic heart failure -improved on torsemide, back on baseline dosing -we discussed that fluid and blood pressure management is the mainstay of treatment.  -renal function precludes use of entresto or SGLT2i -avoid bradycardia given diastolic dysfunction  Hypertension, labile Chronic kidney disease stage 4 -BP has been very elevated recently. Previously had been low, but she has not noted any lows recently -taking amlodipine 10 mg daily -she was previously on hydralazine 100 mg TID; currently on 10 mg TID. We discussed gradually increasing this (see instructions below) with close monitoring of blood pressure -with low diastolic number, will avoid increasing imdur.  -may need to use MAP goal given low diastolic numbers -last GFR 21, consistent with chronic kidney disease stage 4 -given CKD, will not start ACEi/ARB/ARNI/MRA/diuretic.  -+renal bruit, dopplers suggest bilateral renal artery stenosis. Following with nephrology -counseled on red flag signs,  when to call office, when to present to ER  Type II diabetes -last A1c 8.6 -on repaglinide -would typically aim for SGLT2i, but given renal disease this is not an option  Cardiac risk counseling and prevention recommendations: -recommend heart healthy/Mediterranean diet, with whole grains, fruits, vegetable, fish, lean meats, nuts, and olive oil. Limit salt. -recommend moderate walking, 3-5 times/week for 30-50 minutes each session. Aim for at least 150 minutes.week. Goal should be pace of 3 miles/hours, or walking 1.5 miles in 30 minutes -recommend avoidance of tobacco products. Avoid excess alcohol. -ASCVD risk score: The ASCVD Risk score Mikey Bussing DC Brooke Bonito., et al.,  2013) failed to calculate for the following reasons:   The valid systolic blood pressure range is 90 to 200 mmHg   Cannot find a previous HDL lab   Cannot find a previous total cholesterol lab    Plan for follow up: 3-4 weeks or sooner as needed  Buford Dresser, MD, PhD, [facility]    Medication Adjustments/Labs and Tests Ordered: Current medicines are reviewed at length with the patient today.  Concerns regarding medicines are outlined above.  Orders Placed This Encounter  Procedures   EKG 12-Lead    No orders of the defined types were placed in this encounter.   Patient Instructions  Medication Instructions:  We are going to increase your hydralazine slowly. -Increase to 20 mg (2 pills of 10 mg) three times a day for a week -Then increase to 30 mg (3 pills of 10 mg) three times a day for a week -Then increase to 40 mg (4 pills of 10 mg) three times a day for a week -Then increase to 50 mg (5 pills of 10 mg) three times a day for a week  When you run low on the pills you have, let us know. We will either send in 10 mg or 20 mg pills depending on the dose you are taking at the time. Once you are on a steady dose, we will order those pills.  how to check blood pressure:  -sit comfortably  in a chair, feet uncrossed and flat on floor, for 5-10 minutes  -arm ideally should rest at the level of the heart. However, arm should be relaxed and not tense (for example, do not hold the arm up unsupported)  -avoid exercise, caffeine, and tobacco for at least 30 minutes prior to BP reading  -don't take BP cuff reading over clothes (always place on skin directly)  -I prefer to know how well the medication is working, so I would like you to take your readings 1-2 hours after taking your blood pressure medication if possible   Check blood pressure once a day. If the top number (systolic) is 169 or less, let me know.  *If you need a refill on your cardiac medications before your next appointment, please call your pharmacy*   Lab Work: None ordered today  If you have labs (blood work) drawn today and your tests are completely normal, you will receive your results only by: [place] (if you have MyChart) OR A paper copy in the mail If you have any lab test that is abnormal or we need to change your treatment, we will call you to review the results.   Testing/Procedures: None ordered today   Follow-Up: At [facility], you and your health needs are our priority.  As part of our continuing mission to provide you with exceptional heart care, we have created designated Provider Care Teams.  These Care Teams include your primary Cardiologist (physician) and Advanced Practice Providers (APPs -  Physician Assistants and Nurse Practitioners) who all work together to provide you with the care you need, when you need it.  We recommend signing up for the patient portal called "MyChart".  Sign up information is provided on this After Visit Summary.  MyChart is used to connect with patients for Virtual Visits (Telemedicine).  Patients are able to view lab/test results, encounter notes, upcoming appointments, etc.  Non-urgent messages can be sent to your provider as well.   To learn more about  what you can do  with MyChart, go to [email or web address].    Your next appointment:   4 week(s)  The format for your next appointment:   In Person  Provider:   Buford Dresser, MD     Signed, Buford Dresser, MD PhD 08/06/2021 3:42 PM    [place]

## 2021-07-24 NOTE — Patient Instructions (Addendum)
Medication Instructions:  We are going to increase your hydralazine slowly. -Increase to 20 mg (2 pills of 10 mg) three times a day for a week -Then increase to 30 mg (3 pills of 10 mg) three times a day for a week -Then increase to 40 mg (4 pills of 10 mg) three times a day for a week -Then increase to 50 mg (5 pills of 10 mg) three times a day for a week  When you run low on the pills you have, let us know. We will either send in 10 mg or 20 mg pills depending on the dose you are taking at the time. Once you are on a steady dose, we will order those pills.  how to check blood pressure:  -sit comfortably in a chair, feet uncrossed and flat on floor, for 5-10 minutes  -arm ideally should rest at the level of the heart. However, arm should be relaxed and not tense (for example, do not hold the arm up unsupported)  -avoid exercise, caffeine, and tobacco for at least 30 minutes prior to BP reading  -don't take BP cuff reading over clothes (always place on skin directly)  -I prefer to know how well the medication is working, so I would like you to take your readings 1-2 hours after taking your blood pressure medication if possible   Check blood pressure once a day. If the top number (systolic) is [id number] or less, let me know.  *If you need a refill on your cardiac medications before your next appointment, please call your pharmacy*   Lab Work: None ordered today  If you have labs (blood work) drawn today and your tests are completely normal, you will receive your results only by: [place] (if you have MyChart) OR A paper copy in the mail If you have any lab test that is abnormal or we need to change your treatment, we will call you to review the results.   Testing/Procedures: None ordered today   Follow-Up: At [facility], you and your health needs are our priority.  As part of our continuing mission to provide you with exceptional heart care, we have created designated Provider  Care Teams.  These Care Teams include your primary Cardiologist (physician) and Advanced Practice Providers (APPs -  Physician Assistants and Nurse Practitioners) who all work together to provide you with the care you need, when you need it.  We recommend signing up for the patient portal called "MyChart".  Sign up information is provided on this After Visit Summary.  MyChart is used to connect with patients for Virtual Visits (Telemedicine).  Patients are able to view lab/test results, encounter notes, upcoming appointments, etc.  Non-urgent messages can be sent to your provider as well.   To learn more about what you can do with MyChart, go to [email or web address].    Your next appointment:   4 week(s)  The format for your next appointment:   In Person  Provider:   Buford Dresser, MD

## 2021-07-27 ENCOUNTER — Other Ambulatory Visit: Payer: Self-pay | Admitting: Internal Medicine

## 2021-07-27 ENCOUNTER — Other Ambulatory Visit: Payer: Self-pay

## 2021-07-27 DIAGNOSIS — I129 Hypertensive chronic kidney disease with stage 1 through stage 4 chronic kidney disease, or unspecified chronic kidney disease: Secondary | ICD-10-CM

## 2021-07-27 DIAGNOSIS — N184 Chronic kidney disease, stage 4 (severe): Secondary | ICD-10-CM

## 2021-07-27 MED ORDER — ISOSORBIDE MONONITRATE ER 60 MG PO TB24
ORAL_TABLET | Freq: Every day | ORAL | 6 refills | Status: DC
  Filled 2021-07-27: qty 30, 30d supply, fill #0
  Filled 2021-08-31 – 2021-09-07 (×2): qty 30, 30d supply, fill #1

## 2021-07-27 MED ORDER — HYDRALAZINE HCL 10 MG PO TABS
10.0000 mg | ORAL_TABLET | Freq: Three times a day (TID) | ORAL | 6 refills | Status: DC
  Filled 2021-08-31 – 2021-09-07 (×2): qty 60, 20d supply, fill #0

## 2021-07-28 ENCOUNTER — Other Ambulatory Visit: Payer: Self-pay

## 2021-07-28 MED FILL — Repaglinide Tab 0.5 MG: ORAL | 27 days supply | Qty: 54 | Fill #3 | Status: AC

## 2021-07-29 ENCOUNTER — Other Ambulatory Visit: Payer: Self-pay

## 2021-07-30 ENCOUNTER — Ambulatory Visit: Payer: Medicaid Other | Admitting: Internal Medicine

## 2021-08-04 ENCOUNTER — Encounter: Payer: Self-pay | Admitting: *Deleted

## 2021-08-06 ENCOUNTER — Encounter (HOSPITAL_BASED_OUTPATIENT_CLINIC_OR_DEPARTMENT_OTHER): Payer: Self-pay | Admitting: Cardiology

## 2021-08-07 ENCOUNTER — Telehealth: Payer: Self-pay | Admitting: Cardiology

## 2021-08-07 ENCOUNTER — Encounter (HOSPITAL_BASED_OUTPATIENT_CLINIC_OR_DEPARTMENT_OTHER): Payer: Self-pay

## 2021-08-07 NOTE — Telephone Encounter (Signed)
Spoke to patient's daughter n law Helene Kelp.She wanted Dr.Christopher to know patient did not follow directions correctly for increasing Hydralazine.Stated she has been taking Hydralazine 10 mg 2 tablets three times a day for the past 2 weeks.B/P today 136/45.She is dizzy and nauseated.She took her last Zofran today.She wanted Dr.Christopher to refill Zofran.She wanted to know if ok to keep taking Hydralazine 10 mg 2 tablets three times a day.Message sent to Dr.Christopher for advice.

## 2021-08-07 NOTE — Telephone Encounter (Signed)
STAT if patient feels like he/she is going to faint   Are you dizzy now? yes  Do you feel faint or have you passed out? no  Do you have any other symptoms? Nausea, vomiting, difficulty walking/ stumbling, skin feels trembly  Have you checked your HR and BP (record if available)? BP 136/64 sugar 90, does not have HR, HR . When she breathes sounds like a stuffy nose, but states she is breathing fine,    Patient's daughter in law states the patient has been dizzy and vomiting for the last 2 days. She states she was not aware of her symptoms until today, because the patient just told. She states she is not sure if it is because the patient's Hydralazine was changed. She says the patent was not taking the medication unless her BP was high. She started taking it because her BP was extremely high and her dose kept increasing. She says now the patient is taking 5 a day. She says the patient has been nauseas and she does not have anymore Zofran. She states she is concerned, because the patient goes from "perfectly fine" to "can't walk". She says the patient has also been hospitalized for 2 weeks in the last 2 months. She says the first time she was constipated and could not move, and the second time she had fluid in her lungs and had mercer from first time she was in hospital.

## 2021-08-12 DIAGNOSIS — I701 Atherosclerosis of renal artery: Secondary | ICD-10-CM | POA: Diagnosis not present

## 2021-08-12 DIAGNOSIS — N2581 Secondary hyperparathyroidism of renal origin: Secondary | ICD-10-CM | POA: Diagnosis not present

## 2021-08-12 DIAGNOSIS — N184 Chronic kidney disease, stage 4 (severe): Secondary | ICD-10-CM | POA: Diagnosis not present

## 2021-08-12 DIAGNOSIS — E1122 Type 2 diabetes mellitus with diabetic chronic kidney disease: Secondary | ICD-10-CM | POA: Diagnosis not present

## 2021-08-12 DIAGNOSIS — D649 Anemia, unspecified: Secondary | ICD-10-CM | POA: Diagnosis not present

## 2021-08-12 DIAGNOSIS — I129 Hypertensive chronic kidney disease with stage 1 through stage 4 chronic kidney disease, or unspecified chronic kidney disease: Secondary | ICD-10-CM | POA: Diagnosis not present

## 2021-08-17 ENCOUNTER — Telehealth: Payer: Self-pay | Admitting: Diagnostic Neuroimaging

## 2021-08-17 ENCOUNTER — Ambulatory Visit: Payer: MEDICAID | Admitting: Diagnostic Neuroimaging

## 2021-08-17 NOTE — Telephone Encounter (Signed)
Pt's daughter, Yesenya Lappin cancelled appt due to pt not feeling well, rescheduled appt.

## 2021-08-19 ENCOUNTER — Encounter: Payer: Self-pay | Admitting: Internal Medicine

## 2021-08-19 NOTE — Progress Notes (Signed)
Patient seen by nephrologist Dr. Johnney Ou on 08/12/2021. 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  Patient with bilateral renal artery stenosis and labile blood pressure.  Plan to proceed with vascular surgery consult for discussion regarding intervention. Anemia: Iron deficit noted 11/2020.  Oral iron causes severe constipation.  Considering IV iron.  Hold on ESA until seeing iron response. Secondary hyperparathyroidism renal Creatinine was 2.07 with GFR of 24

## 2021-08-20 ENCOUNTER — Other Ambulatory Visit: Payer: Self-pay

## 2021-08-20 ENCOUNTER — Ambulatory Visit (HOSPITAL_BASED_OUTPATIENT_CLINIC_OR_DEPARTMENT_OTHER): Payer: Medicaid Other | Admitting: Cardiology

## 2021-08-20 ENCOUNTER — Encounter (HOSPITAL_BASED_OUTPATIENT_CLINIC_OR_DEPARTMENT_OTHER): Payer: Self-pay | Admitting: Cardiology

## 2021-08-20 ENCOUNTER — Ambulatory Visit (INDEPENDENT_AMBULATORY_CARE_PROVIDER_SITE_OTHER): Payer: Self-pay | Admitting: Cardiology

## 2021-08-20 VITALS — BP 138/50 | HR 63 | Ht 64.0 in | Wt 103.6 lb

## 2021-08-20 DIAGNOSIS — R112 Nausea with vomiting, unspecified: Secondary | ICD-10-CM

## 2021-08-20 DIAGNOSIS — E119 Type 2 diabetes mellitus without complications: Secondary | ICD-10-CM

## 2021-08-20 DIAGNOSIS — I10 Essential (primary) hypertension: Secondary | ICD-10-CM

## 2021-08-20 DIAGNOSIS — I5032 Chronic diastolic (congestive) heart failure: Secondary | ICD-10-CM

## 2021-08-20 DIAGNOSIS — Z419 Encounter for procedure for purposes other than remedying health state, unspecified: Secondary | ICD-10-CM | POA: Diagnosis not present

## 2021-08-20 DIAGNOSIS — N184 Chronic kidney disease, stage 4 (severe): Secondary | ICD-10-CM

## 2021-08-20 MED ORDER — ONDANSETRON HCL 4 MG PO TABS
4.0000 mg | ORAL_TABLET | Freq: Every day | ORAL | 0 refills | Status: DC | PRN
  Filled 2021-08-20: qty 30, 30d supply, fill #0

## 2021-08-20 NOTE — Progress Notes (Signed)
Cardiology Office Note:    Date:  08/20/2021   ID:  [name], DOB [birth year], MRN [id number]  PCP:  Ladell Pier, MD  Cardiologist:  Buford Dresser, MD  Referring MD: Ladell Pier, MD   CC: Follow up evaluation for diastolic heart failure.  History of Present Illness:    [name] is a 60 y.o. female with a hx of hypertension, type II diabetes, chronic kidney disease who is seen for follow-up. I initially met her 12/26/2020 as a new consult at the request of Ladell Pier, MD for the evaluation and management of diastolic heart failure.  Today: She is accompanied by her daughter, who also provides some history.  Overall, [name] is feeling better, has more energy, and is able to cook her meals. When she walks she no longer becomes short of breath.  Lately she has been feeling nauseated frequently, and is in need of a refill for Zofran. When she feels better she does not take any more of this medication.  At home her blood pressure was 132/64 this morning.  Last Saturday her weight dropped from 105 to 102 pounds. Today her weight was 103 this morning.  She denies any palpitations, or chest pain. No lightheadedness, headaches, syncope, orthopnea, or PND. Also has no lower extremity edema or exertional symptoms.   Past Medical History:  Diagnosis Date   Adrenal insufficiency, primary ([place])    Anemia 2018   Hgb 6.8 early 11/2020.  PRBC x 1.  06/2017 PRBC x 1.     CHF (congestive heart failure) ([place]) 49/8264   Diastolic   CKD (chronic kidney disease) stage 4, GFR 15-29 ml/min ([place]) 04/2021   Stage 4 as of late May 2022   Diabetes mellitus without complication ([place])    Hypertension    Hypothyroidism     Past Surgical History:  Procedure Laterality Date   CESAREAN SECTION     IR THORACENTESIS ASP PLEURAL SPACE W/IMG GUIDE  06/05/2021    Current Medications: Current Outpatient Medications on File Prior to Visit  Medication Sig    acetaminophen (TYLENOL) 325 MG tablet Take 2 tablets (650 mg total) by mouth every 6 (six) hours as needed for mild pain.   amLODipine (NORVASC) 10 MG tablet TAKE 1 TABLET (10 MG TOTAL) BY MOUTH DAILY.   aspirin 81 MG chewable tablet Chew 81 mg by mouth daily.   atorvastatin (LIPITOR) 20 MG tablet TAKE 1 TABLET (20 MG TOTAL) BY MOUTH DAILY.   Calcium Carbonate-Vit D-Min (CALCIUM 1200 PO) Take 1,200 mg by mouth daily.   Continuous Blood Gluc Sensor (FREESTYLE LIBRE SENSOR SYSTEM) MISC Change sensor Q 2 wks   hydrALAZINE (APRESOLINE) 10 MG tablet Take 1 tablet (10 mg total) by mouth 3 (three) times daily.   hydrocortisone (CORTEF) 5 MG tablet Take 2 tablets (10 mg total) by mouth in the morning AND 1 tablet (5 mg total) every evening.   isosorbide mononitrate (IMDUR) 60 MG 24 hr tablet TAKE 1 TABLET BY MOUTH DAILY   levothyroxine (SYNTHROID) 150 MCG tablet TAKE 1 TABLET BY MOUTH DAILY BEFORE BREAKFAST.   Multiple Vitamin (MULTIVITAMIN WITH MINERALS) TABS tablet Take 1 tablet by mouth daily.   omeprazole (PRILOSEC) 40 MG capsule TAKE 1 CAPSULE (40 MG TOTAL) BY MOUTH DAILY.   polyethylene glycol (MIRALAX / GLYCOLAX) 17 g packet Take 17 g by mouth daily.   repaglinide (PRANDIN) 0.5 MG tablet TAKE 1 TABLET (0.5 MG TOTAL) BY MOUTH 2 (TWO) TIMES DAILY BEFORE A  MEAL.   torsemide (DEMADEX) 20 MG tablet Take 1 tablet (20 mg total) by mouth daily as needed (for weight gain of 3lbs in 1 day or 5 lbs in 2 days.).   traMADol (ULTRAM) 50 MG tablet Take 1 tablet (50 mg total) by mouth 2 (two) times daily as needed.   vitamin B-12 (CYANOCOBALAMIN) 1000 MCG tablet Take 1 tablet (1,000 mcg total) by mouth daily.   No current facility-administered medications on file prior to visit.     Allergies:   Beef-derived products, Pork-derived products, and Percocet [oxycodone-acetaminophen]   Social History   Tobacco Use   Smoking status: Never   Smokeless tobacco: Never  Substance Use Topics   Alcohol use: No    Drug use: No    Family History: family history includes Diabetes in her mother; Hyperlipidemia in her father; Hypertension in her father. There is no history of Adrenal disorder.  ROS:   Please see the history of present illness. (+) Nausea All other systems are reviewed and negative.   EKGs/Labs/Other Studies Reviewed:    The following studies were reviewed today:  Echo 06/05/21  1. Limited echo to look again for possible endocarditis.   2. Left ventricular ejection fraction, by estimation, is 55 to 60%. The  left ventricle has normal function.   3. The mitral valve is abnormal. Trivial mitral valve regurgitation.  Moderate mitral annular calcification.   4. The aortic valve is normal in structure. Aortic valve regurgitation is  not visualized. No aortic stenosis is present.  Echo 06/03/21  1. Left ventricular ejection fraction, by estimation, is 55 to 60%. The  left ventricle has normal function. The left ventricle has no regional  wall motion abnormalities. Left ventricular diastolic parameters are  consistent with Grade II diastolic  dysfunction (pseudonormalization).   2. Right ventricular systolic function is normal. The right ventricular  size is normal.   3. Left atrial size was mildly dilated.   4. Right atrial size was moderately dilated.   5. The mitral valve is grossly normal. Mild mitral valve regurgitation.  No evidence of mitral stenosis.   6. The aortic valve is grossly normal. Aortic valve regurgitation is not  visualized. No aortic stenosis is present.   US Renal Artery Duplex 03/13/2021: FINDINGS: Right Kidney: Length: 9.6 cm. Echogenicity within normal limits. No mass or hydronephrosis visualized.   Left Kidney: Length: 8.8 cm. Echogenicity within normal limits. No mass or hydronephrosis visualized.   Bladder:  Unremarkable   RENAL DUPLEX ULTRASOUND Right Renal Artery Velocities: Origin:  347 cm/sec Mid:  213 cm/sec [place]:  130  cm/sec Interlobar:  33 cm/sec Arcuate:  22 cm/sec   Left Renal Artery Velocities: Origin:  Obscured by shadowing bowel gas Mid:  251 cm/sec Hilum:  148 cm/sec Interlobar:  41 cm/sec Arcuate:  23 cm/sec Aortic Velocity:  108 cm/sec  Right Renal-Aortic Ratios: Origin: 3.2 Mid:  2.0 Hilum: 1.2 Interlobar: 0.3 Arcuate: 0.2  Left Renal-Aortic Ratios: Origin: Not visualized Mid: 2.3 [place]: 1.4 Interlobar: 0.4 Arcuate: 0.2   Renal veins are patent. Atherosclerotic changes seen throughout the infrarenal abdominal aorta.   IMPRESSION: Doppler findings suspicious for bilateral renal artery stenosis, worse on the right.  Echo 11/21/20 1. Left ventricular ejection fraction, by estimation, is 65 to 70%. The  left ventricle has normal function. The left ventricle has no regional  wall motion abnormalities. Left ventricular diastolic parameters are  consistent with Grade II diastolic  dysfunction (pseudonormalization).   2. Right ventricular systolic function  is normal. The right ventricular  size is normal. There is mildly elevated pulmonary artery systolic  pressure.   3. The mitral valve is grossly normal. Mild mitral valve regurgitation.  No evidence of mitral stenosis.   4. The aortic valve is normal in structure. Aortic valve regurgitation is  not visualized. No aortic stenosis is present.   5. The inferior vena cava is normal in size with greater than 50%  respiratory variability, suggesting right atrial pressure of 3 mmHg.   EKG:  EKG is personally reviewed.   08/20/2021: NSR at 63 bpm, nonspecific ST-T pattern 07/24/2021: Sinus bradycardia at 51 bpm, LVH 05/27/2021: EKG is not ordered today. 12/26/2020: sinus bradycardia at 57 bpm  Recent Labs: 05/16/2021: Magnesium 2.3 06/01/2021: B Natriuretic Peptide 1,106.6 06/02/2021: TSH 1.738 06/04/2021: ALT 17 06/09/2021: Hemoglobin 8.2; Platelets 187 06/11/2021: BUN 32; Creatinine, Ser 3.07; Potassium 3.9; Sodium 129   Recent Lipid  Panel    Component Value Date/Time   CHOL 218 (H) 06/13/2011 0810   TRIG 85 06/13/2011 0810   HDL 65 06/13/2011 0810   CHOLHDL 3.4 06/13/2011 0810   VLDL 17 06/13/2011 0810   LDLCALC 136 (H) 06/13/2011 0810    Physical Exam:    VS:  BP (!) 138/50   Pulse 63   Ht 5\' 4"  (1.626 m)   Wt 103 lb 9.6 oz (47 kg)   SpO2 97%   BMI 17.78 kg/m     Wt Readings from Last 3 Encounters:  08/20/21 103 lb 9.6 oz (47 kg)  07/24/21 105 lb 6.4 oz (47.8 kg)  07/14/21 104 lb 12.8 oz (47.5 kg)    GEN: Well nourished, well developed in no acute distress HEENT: Normal, moist mucous membranes NECK: No JVD CARDIAC: regular rhythm, normal S1 and S2, no rubs or gallops. 2/6 systolic murmur. VASCULAR: Radial and DP pulses 2+ bilaterally. No carotid bruits. +renal bruit RESPIRATORY:  Clear to auscultation without rales, wheezing or rhonchi  ABDOMEN: Soft, non-tender, non-distended MUSCULOSKELETAL:  Ambulates independently SKIN: Warm and dry, trace bilateral LE edema NEUROLOGIC:  Alert and oriented x 3. No focal neuro deficits noted. PSYCHIATRIC:  Normal affect    ASSESSMENT:    1. Primary hypertension   2. Chronic diastolic heart failure (HCC)   3. CKD (chronic kidney disease) stage 4, GFR 15-29 ml/min (HCC)   4. Type 2 diabetes mellitus without obesity (HCC)   5. Non-intractable vomiting with nausea, unspecified vomiting type    PLAN:    Chronic diastolic heart failure, with resolution of acute diastolic heart failure -improved on torsemide, back on baseline dosing -we discussed that fluid and blood pressure management is the mainstay of treatment.  -renal function precludes use of entresto or SGLT2i -avoid bradycardia given diastolic dysfunction  Hypertension, labile Chronic kidney disease stage 4 -BP has been labile, both low/high. Low diastolic number today, will not make changes -taking amlodipine 10 mg daily -she was previously on hydralazine 100 mg TID; currently on 10 mg TID.  Attempted to increase but was symptomatic -with low diastolic number, will avoid increasing imdur.  -may need to use MAP goal given low diastolic numbers -last GFR 21, consistent with chronic kidney disease stage 4 -given CKD, will not start ACEi/ARB/ARNI/MRA/diuretic.  -+renal bruit, dopplers suggest bilateral renal artery stenosis. Following with nephrology -counseled on red flag signs, when to call office, when to present to ER  Type II diabetes -on repaglinide -would typically aim for SGLT2i, but given renal disease this is not an option  Nausea: will give one time refill of zofran for her symptoms.  Cardiac risk counseling and prevention recommendations: -recommend heart healthy/Mediterranean diet, with whole grains, fruits, vegetable, fish, lean meats, nuts, and olive oil. Limit salt. -recommend moderate walking, 3-5 times/week for 30-50 minutes each session. Aim for at least 150 minutes.week. Goal should be pace of 3 miles/hours, or walking 1.5 miles in 30 minutes -recommend avoidance of tobacco products. Avoid excess alcohol. -ASCVD risk score: The ASCVD Risk score Mikey Bussing DC Jr., et al., 2013) failed to calculate for the following reasons:   Cannot find a previous HDL lab   Cannot find a previous total cholesterol lab    Plan for follow up: 6 months or sooner as needed  Buford Dresser, MD, PhD, [facility]    Medication Adjustments/Labs and Tests Ordered: Current medicines are reviewed at length with the patient today.  Concerns regarding medicines are outlined above.  Orders Placed This Encounter  Procedures   EKG 12-Lead     Meds ordered this encounter  Medications   ondansetron (ZOFRAN) 4 MG tablet    Sig: Take 1 tablet (4 mg total) by mouth daily as needed for nausea or vomiting.    Dispense:  30 tablet    Refill:  0     Patient Instructions  Medication Instructions:  Your Physician recommend you continue on your current  medication as directed.    *If you need a refill on your cardiac medications before your next appointment, please call your pharmacy*   Lab Work: None ordered   Testing/Procedures: None ordered    Follow-Up: At [facility], you and your health needs are our priority.  As part of our continuing mission to provide you with exceptional heart care, we have created designated Provider Care Teams.  These Care Teams include your primary Cardiologist (physician) and Advanced Practice Providers (APPs -  Physician Assistants and Nurse Practitioners) who all work together to provide you with the care you need, when you need it.  We recommend signing up for the patient portal called "MyChart".  Sign up information is provided on this After Visit Summary.  MyChart is used to connect with patients for Virtual Visits (Telemedicine).  Patients are able to view lab/test results, encounter notes, upcoming appointments, etc.  Non-urgent messages can be sent to your provider as well.   To learn more about what you can do with MyChart, go to [email or web address].    Your next appointment:   6 month(s)  The format for your next appointment:   In Person  Provider:   Buford Dresser, MD     [facility],acting as a scribe for Buford Dresser, MD.,have documented all relevant documentation on the behalf of Buford Dresser, MD,as directed by  Buford Dresser, MD while in the presence of Buford Dresser, MD.  I, Buford Dresser, MD, have reviewed all documentation for this visit. The documentation on 09/09/21 for the exam, diagnosis, procedures, and orders are all accurate and complete.   Signed, Buford Dresser, MD PhD 08/20/2021   [facility]

## 2021-08-20 NOTE — Telephone Encounter (Signed)
Appointment completed on 9/1.

## 2021-08-20 NOTE — Patient Instructions (Signed)

## 2021-08-27 ENCOUNTER — Other Ambulatory Visit: Payer: Self-pay

## 2021-08-31 ENCOUNTER — Other Ambulatory Visit: Payer: Self-pay

## 2021-08-31 ENCOUNTER — Other Ambulatory Visit: Payer: Self-pay | Admitting: Family Medicine

## 2021-08-31 ENCOUNTER — Other Ambulatory Visit: Payer: Self-pay | Admitting: Internal Medicine

## 2021-08-31 DIAGNOSIS — K219 Gastro-esophageal reflux disease without esophagitis: Secondary | ICD-10-CM

## 2021-08-31 MED ORDER — OMEPRAZOLE 40 MG PO CPDR
DELAYED_RELEASE_CAPSULE | Freq: Every day | ORAL | 0 refills | Status: DC
  Filled 2021-08-31 – 2021-09-07 (×2): qty 30, 30d supply, fill #0

## 2021-08-31 MED FILL — Repaglinide Tab 0.5 MG: ORAL | 30 days supply | Qty: 60 | Fill #4 | Status: CN

## 2021-08-31 NOTE — Telephone Encounter (Signed)
Requested Prescriptions  Pending Prescriptions Disp Refills  . omeprazole (PRILOSEC) 40 MG capsule 30 capsule 0    Sig: TAKE 1 CAPSULE (40 MG TOTAL) BY MOUTH DAILY.     Gastroenterology: Proton Pump Inhibitors Passed - 08/31/2021  3:33 PM      Passed - Valid encounter within last 12 months    Recent Outpatient Visits          1 month ago Hospital discharge follow-up   [place] Ladell Pier, MD   5 months ago Hypertensive kidney disease with chronic kidney disease stage V [facility])   [place] Ladell Pier, MD   9 months ago Chronic diastolic congestive heart failure [facility])   [place], Charlane Ferretti, MD   10 months ago Hypoglycemia associated with diabetes [facility])   [place], MD      Future Appointments            In 1 month Wynetta Emery, Dalbert Batman, MD [place]   In 5 months Buford Dresser, MD [facility]

## 2021-09-01 ENCOUNTER — Other Ambulatory Visit: Payer: Self-pay

## 2021-09-01 DIAGNOSIS — E2740 Unspecified adrenocortical insufficiency: Secondary | ICD-10-CM

## 2021-09-04 ENCOUNTER — Other Ambulatory Visit (HOSPITAL_COMMUNITY): Payer: Self-pay

## 2021-09-07 ENCOUNTER — Other Ambulatory Visit: Payer: Self-pay

## 2021-09-07 ENCOUNTER — Other Ambulatory Visit: Payer: Self-pay | Admitting: Internal Medicine

## 2021-09-07 MED ORDER — TORSEMIDE 20 MG PO TABS
20.0000 mg | ORAL_TABLET | Freq: Every day | ORAL | 0 refills | Status: DC | PRN
  Filled 2021-09-07: qty 60, 60d supply, fill #0

## 2021-09-07 MED FILL — Repaglinide Tab 0.5 MG: ORAL | 30 days supply | Qty: 60 | Fill #4 | Status: AC

## 2021-09-08 ENCOUNTER — Other Ambulatory Visit: Payer: Self-pay

## 2021-09-09 ENCOUNTER — Inpatient Hospital Stay (HOSPITAL_COMMUNITY): Admission: RE | Admit: 2021-09-09 | Payer: Medicaid Other | Source: Ambulatory Visit

## 2021-09-09 ENCOUNTER — Encounter (HOSPITAL_BASED_OUTPATIENT_CLINIC_OR_DEPARTMENT_OTHER): Payer: Self-pay | Admitting: Cardiology

## 2021-09-09 ENCOUNTER — Encounter: Payer: Medicaid Other | Admitting: Vascular Surgery

## 2021-09-10 ENCOUNTER — Other Ambulatory Visit: Payer: Self-pay

## 2021-09-19 ENCOUNTER — Emergency Department (HOSPITAL_BASED_OUTPATIENT_CLINIC_OR_DEPARTMENT_OTHER)
Admission: EM | Admit: 2021-09-19 | Discharge: 2021-09-19 | Disposition: A | Discharge status: Home / Self Care | Payer: Medicaid Other | Attending: Emergency Medicine | Admitting: Emergency Medicine

## 2021-09-19 ENCOUNTER — Emergency Department (HOSPITAL_BASED_OUTPATIENT_CLINIC_OR_DEPARTMENT_OTHER): Payer: Medicaid Other

## 2021-09-19 ENCOUNTER — Other Ambulatory Visit: Payer: Self-pay

## 2021-09-19 ENCOUNTER — Encounter (HOSPITAL_BASED_OUTPATIENT_CLINIC_OR_DEPARTMENT_OTHER): Payer: Self-pay

## 2021-09-19 ENCOUNTER — Other Ambulatory Visit: Payer: Self-pay | Admitting: Internal Medicine

## 2021-09-19 DIAGNOSIS — S62396A Other fracture of fifth metacarpal bone, right hand, initial encounter for closed fracture: Secondary | ICD-10-CM

## 2021-09-19 DIAGNOSIS — S62356A Nondisplaced fracture of shaft of fifth metacarpal bone, right hand, initial encounter for closed fracture: Secondary | ICD-10-CM | POA: Insufficient documentation

## 2021-09-19 DIAGNOSIS — E11649 Type 2 diabetes mellitus with hypoglycemia without coma: Secondary | ICD-10-CM | POA: Insufficient documentation

## 2021-09-19 DIAGNOSIS — Z7982 Long term (current) use of aspirin: Secondary | ICD-10-CM | POA: Insufficient documentation

## 2021-09-19 DIAGNOSIS — E039 Hypothyroidism, unspecified: Secondary | ICD-10-CM | POA: Insufficient documentation

## 2021-09-19 DIAGNOSIS — Y92009 Unspecified place in unspecified non-institutional (private) residence as the place of occurrence of the external cause: Secondary | ICD-10-CM | POA: Insufficient documentation

## 2021-09-19 DIAGNOSIS — D631 Anemia in chronic kidney disease: Secondary | ICD-10-CM | POA: Diagnosis not present

## 2021-09-19 DIAGNOSIS — Z7984 Long term (current) use of oral hypoglycemic drugs: Secondary | ICD-10-CM | POA: Diagnosis not present

## 2021-09-19 DIAGNOSIS — N184 Chronic kidney disease, stage 4 (severe): Secondary | ICD-10-CM | POA: Insufficient documentation

## 2021-09-19 DIAGNOSIS — E1122 Type 2 diabetes mellitus with diabetic chronic kidney disease: Secondary | ICD-10-CM | POA: Insufficient documentation

## 2021-09-19 DIAGNOSIS — W228XXA Striking against or struck by other objects, initial encounter: Secondary | ICD-10-CM | POA: Diagnosis not present

## 2021-09-19 DIAGNOSIS — I5033 Acute on chronic diastolic (congestive) heart failure: Secondary | ICD-10-CM | POA: Insufficient documentation

## 2021-09-19 DIAGNOSIS — H918X2 Other specified hearing loss, left ear: Secondary | ICD-10-CM | POA: Diagnosis not present

## 2021-09-19 DIAGNOSIS — S62300A Unspecified fracture of second metacarpal bone, right hand, initial encounter for closed fracture: Secondary | ICD-10-CM | POA: Diagnosis not present

## 2021-09-19 DIAGNOSIS — Z79899 Other long term (current) drug therapy: Secondary | ICD-10-CM | POA: Insufficient documentation

## 2021-09-19 DIAGNOSIS — H6122 Impacted cerumen, left ear: Secondary | ICD-10-CM | POA: Insufficient documentation

## 2021-09-19 DIAGNOSIS — I130 Hypertensive heart and chronic kidney disease with heart failure and stage 1 through stage 4 chronic kidney disease, or unspecified chronic kidney disease: Secondary | ICD-10-CM | POA: Diagnosis not present

## 2021-09-19 DIAGNOSIS — M79641 Pain in right hand: Secondary | ICD-10-CM | POA: Insufficient documentation

## 2021-09-19 DIAGNOSIS — S6710XA Crushing injury of unspecified finger(s), initial encounter: Secondary | ICD-10-CM | POA: Diagnosis not present

## 2021-09-19 DIAGNOSIS — Z419 Encounter for procedure for purposes other than remedying health state, unspecified: Secondary | ICD-10-CM | POA: Diagnosis not present

## 2021-09-19 DIAGNOSIS — S6991XA Unspecified injury of right wrist, hand and finger(s), initial encounter: Secondary | ICD-10-CM | POA: Diagnosis present

## 2021-09-19 MED ORDER — TRAMADOL HCL 50 MG PO TABS: 50.0000 mg | ORAL_TABLET | Freq: Four times a day (QID) | ORAL | 0 refills | Status: DC | PRN

## 2021-09-19 MED ORDER — CARBAMIDE PEROXIDE 6.5 % OT SOLN: 5.0000 [drp] | Freq: Two times a day (BID) | OTIC | 0 refills | Status: AC

## 2021-09-19 MED ORDER — ACETAMINOPHEN 325 MG PO TABS: 650.0000 mg | ORAL_TABLET | Freq: Four times a day (QID) | ORAL | 0 refills | Status: DC | PRN

## 2021-09-19 NOTE — ED Triage Notes (Addendum)
Pt is present for a right hand injury after she slammed her hand on a wooden screen door while carrying something inside her home. Incident happened appx 1000 this morning. Pt states her right pinky hurts the most and unable to move it due to the pain. Pt would also like her ears checked out and possibly irrigated. Tylenol taken at 1700 this evening with minimal relief.

## 2021-09-19 NOTE — Telephone Encounter (Signed)
Requested medication (s) are due for refill today: yes  Requested medication (s) are on the active medication list: yes  Last refill:  07/22/21 #60  Future visit scheduled: yes  Notes to clinic:  med not delegated to NT to RF   Requested Prescriptions  Pending Prescriptions Disp Refills   traMADol (ULTRAM) 50 MG tablet 60 tablet 0    Sig: Take 1 tablet (50 mg total) by mouth 2 (two) times daily as needed.     Not Delegated - Analgesics:  Opioid Agonists Failed - 09/19/2021  5:15 PM      Failed - This refill cannot be delegated      Passed - Urine Drug Screen completed in last 360 days      Passed - Valid encounter within last 6 months    Recent Outpatient Visits           2 months ago Hospital discharge follow-up   [place], Deborah B, MD   5 months ago Hypertensive kidney disease with chronic kidney disease stage V [facility])   [place] Ladell Pier, MD   9 months ago Chronic diastolic congestive heart failure [facility])   [place], Charlane Ferretti, MD   10 months ago Hypoglycemia associated with diabetes [facility])   [place] Charlott Rakes, MD       Future Appointments             In 3 weeks Wynetta Emery, Dalbert Batman, MD [facility]   In 5 months Buford Dresser, MD [facility]

## 2021-09-19 NOTE — ED Provider Notes (Signed)
[facility] EMERGENCY DEPT Provider Note   CSN: [id number] Arrival date & time: 09/19/21  1856     History Chief Complaint  Patient presents with   Hand Injury    [name] is a 60 y.o. female presenting to the ED with right hand pain.  She reports that she accidentally slammed a door on her right hand is having pain in the right finger.  This happened this morning.  She is right-handed.  No other injuries reported.  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  HPI     Past Medical History:  Diagnosis Date   Adrenal insufficiency, primary ([place])    Anemia 2018   Hgb 6.8 early 11/2020.  PRBC x 1.  06/2017 PRBC x 1.     CHF (congestive heart failure) ([place]) 99991111   Diastolic   CKD (chronic kidney disease) stage 4, GFR 15-29 ml/min ([place]) 04/2021   Stage 4 as of late May 2022   Diabetes mellitus without complication ([place])    Hypertension    Hypothyroidism     Patient Active Problem List   Diagnosis Date Noted   Protein-calorie malnutrition, severe 06/06/2021   Bacteremia due to methicillin susceptible Staphylococcus aureus (MSSA) 06/05/2021   CHF exacerbation (HCC) 06/05/2021   Volume overload 06/05/2021   Respiratory failure ([place]) 06/05/2021   AKI (acute kidney injury) ([place]) 06/01/2021   Acute on chronic diastolic CHF (congestive heart failure) ([place]) 06/01/2021   DM2 (diabetes mellitus, type 2) ([place]) 06/01/2021   Dizziness 05/13/2021   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   Hypokalemia 05/12/2021   Fecal impaction ([place]) 05/12/2021   Anemia due to chronic kidney disease 05/12/2021   Adrenal insufficiency (Addison's disease) ([place]) 05/12/2021   CKD (chronic kidney disease) stage 4, GFR 15-29 ml/min ([place]) 11/21/2020   HTN (hypertension) 11/21/2020   New onset of congestive heart failure ([place]) 11/21/2020   Adrenal insufficiency ([place]) 03/27/2018   Dehydration 12/09/2017   Abdominal pain,  bilateral lower quadrant 12/09/2017   Hypoglycemia 07/08/2017   Hypoglycemia associated with diabetes ([place]) 05/10/2017   Malnutrition of moderate degree 05/10/2017    Past Surgical History:  Procedure Laterality Date   CESAREAN SECTION     IR THORACENTESIS ASP PLEURAL SPACE W/IMG GUIDE  06/05/2021     OB History   No obstetric history on file.     Family History  Problem Relation Age of Onset   Diabetes Mother    Hyperlipidemia Father    Hypertension Father    Adrenal disorder Neg Hx     Social History   Tobacco Use   Smoking status: Never   Smokeless tobacco: Never  Substance Use Topics   Alcohol use: No   Drug use: No    Home Medications Prior to Admission medications   Medication Sig Start Date End Date Taking? Authorizing Provider  acetaminophen (TYLENOL) 325 MG tablet Take 2 tablets (650 mg total) by mouth every 6 (six) hours as needed for up to 30 doses for moderate pain or mild pain. 09/19/21  Yes Linnae Rasool, Carola Rhine, MD  carbamide peroxide (DEBROX) 6.5 % OTIC solution Place 5 drops into both ears 2 (two) times daily for 5 days. 09/19/21 09/24/21 Yes Damoni Erker, Carola Rhine, MD  traMADol (ULTRAM) 50 MG tablet Take 1 tablet (50 mg total) by mouth every 6 (six) hours as needed for up to 10 doses for severe pain. 09/19/21  Yes Osten Janek, Carola Rhine, MD  acetaminophen (  TYLENOL) 325 MG tablet Take 2 tablets (650 mg total) by mouth every 6 (six) hours as needed for mild pain. 03/31/18   Dixie Dials, MD  amLODipine (NORVASC) 10 MG tablet TAKE 1 TABLET (10 MG TOTAL) BY MOUTH DAILY. 04/08/21 04/08/22  Ladell Pier, MD  aspirin 81 MG chewable tablet Chew 81 mg by mouth daily.    [provider]  atorvastatin (LIPITOR) 20 MG tablet TAKE 1 TABLET (20 MG TOTAL) BY MOUTH DAILY. 07/15/21 07/15/22  Charlott Rakes, MD  Calcium Carbonate-Vit D-Min (CALCIUM 1200 PO) Take 1,200 mg by mouth daily.    [provider]  Continuous Blood Gluc Sensor (FREESTYLE LIBRE SENSOR SYSTEM)  MISC Change sensor Q 2 wks 07/19/21   Ladell Pier, MD  hydrALAZINE (APRESOLINE) 10 MG tablet Take 1 tablet (10 mg total) by mouth 3 (three) times daily. 07/27/21   Ladell Pier, MD  hydrocortisone (CORTEF) 5 MG tablet Take 2 tablets (10 mg total) by mouth in the morning AND 1 tablet (5 mg total) every evening. 07/02/21   Ladell Pier, MD  isosorbide mononitrate (IMDUR) 60 MG 24 hr tablet TAKE 1 TABLET BY MOUTH DAILY 07/27/21 07/27/22  Ladell Pier, MD  levothyroxine (SYNTHROID) 150 MCG tablet TAKE 1 TABLET BY MOUTH DAILY BEFORE BREAKFAST. 07/22/21 07/22/22  Ladell Pier, MD  Multiple Vitamin (MULTIVITAMIN WITH MINERALS) TABS tablet Take 1 tablet by mouth daily.    [provider]  omeprazole (PRILOSEC) 40 MG capsule TAKE 1 CAPSULE (40 MG TOTAL) BY MOUTH DAILY. 08/31/21 08/31/22  Ladell Pier, MD  ondansetron (ZOFRAN) 4 MG tablet Take 1 tablet (4 mg total) by mouth daily as needed for nausea or vomiting. 08/20/21   Buford Dresser, MD  polyethylene glycol (MIRALAX / GLYCOLAX) 17 g packet Take 17 g by mouth daily. 05/17/21   Ghimire, Henreitta Leber, MD  repaglinide (PRANDIN) 0.5 MG tablet TAKE 1 TABLET (0.5 MG TOTAL) BY MOUTH 2 (TWO) TIMES DAILY BEFORE A MEAL. 02/15/21 02/15/22  Renato Shin, MD  torsemide (DEMADEX) 20 MG tablet Take 1 tablet (20 mg total) by mouth daily as needed (for weight gain of 3lbs in 1 day or 5 lbs in 2 days.). 09/07/21   Ladell Pier, MD  traMADol (ULTRAM) 50 MG tablet Take 1 tablet (50 mg total) by mouth 2 (two) times daily as needed. 07/22/21   Ladell Pier, MD  vitamin B-12 (CYANOCOBALAMIN) 1000 MCG tablet Take 1 tablet (1,000 mcg total) by mouth daily. 05/16/21   Ghimire, Henreitta Leber, MD    Allergies    Beef-derived products, Pork-derived products, and Percocet [oxycodone-acetaminophen]  Review of Systems   Review of Systems  Constitutional:  Negative for chills and fever.  HENT:  Negative for ear discharge, ear pain and sore  throat.   Musculoskeletal:  Positive for arthralgias and myalgias.  Skin:  Negative for color change and rash.  Neurological:  Negative for syncope and light-headedness.  All other systems reviewed and are negative.  Physical Exam Updated Vital Signs BP (!) 182/60 (BP Location: Left Arm)   Pulse 80   Temp 98 F (36.7 C) (Oral)   Resp 17   Ht '5\' 4"'$  (1.626 m)   Wt 45.4 kg   SpO2 100%   BMI 17.16 kg/m   Physical Exam Constitutional:      General: She is not in acute distress. HENT:     Head: Normocephalic and atraumatic.     Comments: Right TM clear, left external  canal obstructed by ear wax Eyes:     Conjunctiva/sclera: Conjunctivae normal.     Pupils: Pupils are equal, round, and reactive to light.  Cardiovascular:     Rate and Rhythm: Normal rate and regular rhythm.  Pulmonary:     Effort: Pulmonary effort is normal. No respiratory distress.  Abdominal:     General: There is no distension.     Tenderness: There is no abdominal tenderness.  Musculoskeletal:     Comments: Tenderness along right 5th proximal phalanx  Skin:    General: Skin is warm and dry.  Neurological:     General: No focal deficit present.     Mental Status: She is alert and oriented to person, place, and time. Mental status is at baseline.     Sensory: No sensory deficit.     Motor: No weakness.  Psychiatric:        Mood and Affect: Mood normal.        Behavior: Behavior normal.    ED Results / Procedures / Treatments   Labs (all labs ordered are listed, but only abnormal results are displayed) Labs Reviewed - No data to display  EKG None  Radiology DG Hand Complete Right  Result Date: 09/19/2021 CLINICAL DATA:  Door slammed on the fifth finger. EXAM: RIGHT HAND - COMPLETE 3+ VIEW COMPARISON:  None. FINDINGS: There is a contour abnormality of the head of the fifth metacarpal which is favored to reflect a mildly displaced fracture which extends to the metacarpal phalangeal joint. No joint  dislocation. There are mild degenerative changes in the interphalangeal joints and first carpal metacarpal joint. Soft tissues are unremarkable. IMPRESSION: Contour abnormality of the head of the fifth metacarpal is favored to reflect a mildly displaced fracture. Electronically Signed   By: Zerita Boers M.D.   On: 09/19/2021 19:43    Procedures Procedures   Medications Ordered in ED Medications - No data to display  ED Course  I have reviewed the triage vital signs and the nursing notes.  Pertinent labs & imaging results that were available during my care of the patient were reviewed by me and considered in my medical decision making (see chart for details).  Isolated trauma injury to right hand - xrays ordered and personally reviewed, showing possible right 5th metacarpal head fracture.  Placed in ulnar gutter splint, advised hand surgeon f/u in 1-2 weeks.   I attempted manual disimpaction of ear wax but she did not tolerate this.  Will prescribe debrox.  No other injuries reported.     Final Clinical Impression(s) / ED Diagnoses Final diagnoses:  Hearing loss of left ear due to cerumen impaction  Closed nondisplaced fracture of other part of fifth metacarpal bone of right hand, initial encounter    Rx / DC Orders ED Discharge Orders          Ordered    traMADol (ULTRAM) 50 MG tablet  Every 6 hours PRN        09/19/21 2009    acetaminophen (TYLENOL) 325 MG tablet  Every 6 hours PRN        09/19/21 2009    carbamide peroxide (DEBROX) 6.5 % OTIC solution  2 times daily        09/19/21 2009             Wyvonnia Dusky, MD 09/19/21 2027

## 2021-09-21 MED ORDER — TRAMADOL HCL 50 MG PO TABS
50.0000 mg | ORAL_TABLET | Freq: Two times a day (BID) | ORAL | 0 refills | Status: DC | PRN
  Filled 2021-09-21: qty 60, 30d supply, fill #0

## 2021-09-22 ENCOUNTER — Other Ambulatory Visit: Payer: Self-pay

## 2021-09-29 ENCOUNTER — Other Ambulatory Visit: Payer: Self-pay

## 2021-10-06 DIAGNOSIS — R051 Acute cough: Secondary | ICD-10-CM | POA: Diagnosis not present

## 2021-10-06 DIAGNOSIS — J0100 Acute maxillary sinusitis, unspecified: Secondary | ICD-10-CM | POA: Diagnosis not present

## 2021-10-06 DIAGNOSIS — Z20822 Contact with and (suspected) exposure to covid-19: Secondary | ICD-10-CM | POA: Diagnosis not present

## 2021-10-12 ENCOUNTER — Emergency Department (HOSPITAL_BASED_OUTPATIENT_CLINIC_OR_DEPARTMENT_OTHER)
Admission: EM | Admit: 2021-10-12 | Discharge: 2021-10-12 | Discharge status: Against Medical Advice | Payer: Medicaid Other

## 2021-10-12 ENCOUNTER — Other Ambulatory Visit: Payer: Self-pay

## 2021-10-12 NOTE — ED Notes (Signed)
Pt decided not to wait to be seen, turned labels in to registration.

## 2021-10-15 DIAGNOSIS — J3489 Other specified disorders of nose and nasal sinuses: Secondary | ICD-10-CM | POA: Diagnosis not present

## 2021-10-15 DIAGNOSIS — H6122 Impacted cerumen, left ear: Secondary | ICD-10-CM | POA: Diagnosis not present

## 2021-10-15 DIAGNOSIS — H6521 Chronic serous otitis media, right ear: Secondary | ICD-10-CM | POA: Diagnosis not present

## 2021-10-16 ENCOUNTER — Ambulatory Visit: Payer: Medicaid Other | Attending: Internal Medicine | Admitting: Internal Medicine

## 2021-10-16 ENCOUNTER — Other Ambulatory Visit: Payer: Self-pay

## 2021-10-16 ENCOUNTER — Encounter: Payer: Self-pay | Admitting: Internal Medicine

## 2021-10-16 VITALS — BP 166/58 | HR 64 | Ht 64.0 in | Wt 101.6 lb

## 2021-10-16 DIAGNOSIS — S62390A Other fracture of second metacarpal bone, right hand, initial encounter for closed fracture: Secondary | ICD-10-CM | POA: Diagnosis not present

## 2021-10-16 DIAGNOSIS — Z23 Encounter for immunization: Secondary | ICD-10-CM

## 2021-10-16 DIAGNOSIS — E119 Type 2 diabetes mellitus without complications: Secondary | ICD-10-CM

## 2021-10-16 DIAGNOSIS — M1731 Unilateral post-traumatic osteoarthritis, right knee: Secondary | ICD-10-CM

## 2021-10-16 DIAGNOSIS — Z1211 Encounter for screening for malignant neoplasm of colon: Secondary | ICD-10-CM

## 2021-10-16 DIAGNOSIS — E038 Other specified hypothyroidism: Secondary | ICD-10-CM | POA: Diagnosis not present

## 2021-10-16 DIAGNOSIS — E2740 Unspecified adrenocortical insufficiency: Secondary | ICD-10-CM

## 2021-10-16 DIAGNOSIS — N184 Chronic kidney disease, stage 4 (severe): Secondary | ICD-10-CM

## 2021-10-16 DIAGNOSIS — I129 Hypertensive chronic kidney disease with stage 1 through stage 4 chronic kidney disease, or unspecified chronic kidney disease: Secondary | ICD-10-CM

## 2021-10-16 DIAGNOSIS — Z1231 Encounter for screening mammogram for malignant neoplasm of breast: Secondary | ICD-10-CM

## 2021-10-16 DIAGNOSIS — R053 Chronic cough: Secondary | ICD-10-CM | POA: Diagnosis not present

## 2021-10-16 LAB — POCT GLYCOSYLATED HEMOGLOBIN (HGB A1C): HbA1c, POC (controlled diabetic range): 5.3 % (ref 0.0–7.0)

## 2021-10-16 MED ORDER — REPAGLINIDE 0.5 MG PO TABS: ORAL_TABLET | ORAL | 11 refills | Status: DC

## 2021-10-16 MED ORDER — ISOSORBIDE MONONITRATE ER 60 MG PO TB24: ORAL_TABLET | Freq: Every day | ORAL | 6 refills | Status: DC

## 2021-10-16 MED ORDER — TORSEMIDE 20 MG PO TABS: 20.0000 mg | ORAL_TABLET | Freq: Every day | ORAL | 1 refills | Status: DC | PRN

## 2021-10-16 MED ORDER — ATORVASTATIN CALCIUM 20 MG PO TABS: ORAL_TABLET | Freq: Every day | ORAL | 11 refills | Status: DC

## 2021-10-16 MED ORDER — HYDRALAZINE HCL 10 MG PO TABS: 10.0000 mg | ORAL_TABLET | Freq: Three times a day (TID) | ORAL | 6 refills | Status: DC

## 2021-10-16 MED ORDER — AMLODIPINE BESYLATE 10 MG PO TABS: ORAL_TABLET | Freq: Every day | ORAL | 11 refills | Status: DC

## 2021-10-16 MED ORDER — TRAMADOL HCL 50 MG PO TABS: 50.0000 mg | ORAL_TABLET | Freq: Two times a day (BID) | ORAL | 1 refills | Status: DC | PRN

## 2021-10-16 MED ORDER — LEVOTHYROXINE SODIUM 150 MCG PO TABS: ORAL_TABLET | ORAL | 6 refills | Status: DC

## 2021-10-16 NOTE — Progress Notes (Signed)
Needs all [facility]

## 2021-10-16 NOTE — Patient Instructions (Signed)
Increase the Prandin to once a day with the largest meal of the day. Go to the radiology department at her convenience to have the chest x-ray done. I have referred you for the mammogram and for your diabetic eye exam.

## 2021-10-16 NOTE — Progress Notes (Signed)
Patient ID: [name], female    DOB: [birth year]  MRN: [id number]  CC: Hypertension and Diabetes   Subjective: [name] is a 60 y.o. female who presents for chronic ds management.  Daughter is with her. Her concerns today include:  [name] is a 60 year old female with a history of chronic adrenal insufficiency, DM type 2 (followed by Dr. Loanne Drilling), HTN, stage 4 CKD, diastolic CHF (EF of 65 to 70%, followed by Dr. Harrell Gave), hypothyroid, GERD   Patient was seen in the emergency room on the first of this month for pain of fifth finger on the right hand.  Accidentally crushed it in the door.  X-ray favored a mildly displaced fracture of the head of the fifth metacarpal.  Placed in a soft removable splint/cast and told to follow-up with orthopedics.  She has an appointment coming up with orthopedics but does not remember the date offhand.  She was given some extra tramadol to take as needed.  She is now out of tramadol.  I had sent a prescription for her earlier this month but apparently it has not been picked up from the pharmacy as yet..   Adrenal Insuff: Reports still being on Cortef.  However the last prescription that I see in the system was sent in July for 1 month supply.  After I saw her in July post hospital follow-up, her daughter was supposed to call Dr. Cordelia Pen office to schedule a follow-up appointment but apparently forgot to do so.  Patient complains of having a cold 2 months.  She reports persistent cough that is worse at nights.  She brings up phlegm during the day but during the night the cough is dry.  Denies any reflux symptoms.  Denies any drainage of mucus at the back of the throat.  She was seen this month at urgent care for the same.  She thinks she was diagnosed with bronchitis and was prescribed antibiotics, some cough suppressant and an inhaler.  She had a negative COVID test on the 18th of this month.  The cough has not resolved.  Reports occasional  shortness of breath.  She is not on an ACE inhibitor.  HTN: Orts compliance with her medications including hydralazine, amlodipine and isosorbide.  She has not taken her second hydralazine today as she is out of that medicine.  She has a home blood pressure device but has not been checking blood pressure recently due to her right hand being in a cast.    DM Results for orders placed or performed in visit on 10/16/21  POCT glycosylated hemoglobin (Hb A1C)  Result Value Ref Range   Hemoglobin A1C     HbA1c POC (<> result, manual entry)     HbA1c, POC (prediabetic range)     HbA1c, POC (controlled diabetic range) 5.3 0.0 - 7.0 %   Checking BS a.m and afternoon.  He is on Prandin 0.5 g twice a day before meals. She gives blood sugar ranges: A.m 65-95, before dinner 100-110, after dinner 125-130 Reports decreased appetite She is taking and tolerating her thyroid medication levothyroxine.  She requests that all of her prescriptions [facility]  HM: She is due for diabetic eye exam, colon cancer screening.  She reports having had a colonoscopy more than 10 years ago in [place] that was normal. Patient Active Problem List   Diagnosis Date Noted   Protein-calorie malnutrition, severe 06/06/2021   Bacteremia due to methicillin susceptible Staphylococcus aureus (MSSA) 06/05/2021  CHF exacerbation (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   Hypokalemia 05/12/2021   Fecal impaction ([place]) 05/12/2021   Anemia due to chronic kidney disease 05/12/2021   Adrenal insufficiency (Addison's disease) ([place]) 05/12/2021   CKD (chronic kidney disease) stage 4, GFR 15-29 ml/min ([place]) 11/21/2020   HTN (hypertension)  11/21/2020   New onset of congestive heart failure ([place]) 11/21/2020   Adrenal insufficiency ([place]) 03/27/2018   Dehydration 12/09/2017   Abdominal pain, bilateral lower quadrant 12/09/2017   Hypoglycemia 07/08/2017   Hypoglycemia associated with diabetes ([place]) 05/10/2017   Malnutrition of moderate degree 05/10/2017     Current Outpatient Medications on File Prior to Visit  Medication Sig Dispense Refill   acetaminophen (TYLENOL) 325 MG tablet Take 2 tablets (650 mg total) by mouth every 6 (six) hours as needed for up to 30 doses for moderate pain or mild pain. 30 tablet 0   aspirin 81 MG chewable tablet Chew 81 mg by mouth daily.     Calcium Carbonate-Vit D-Min (CALCIUM 1200 PO) Take 1,200 mg by mouth daily.     Continuous Blood Gluc Sensor (FREESTYLE LIBRE SENSOR SYSTEM) MISC Change sensor Q 2 wks 2 each 12   hydrocortisone (CORTEF) 5 MG tablet Take 2 tablets (10 mg total) by mouth in the morning AND 1 tablet (5 mg total) every evening. 45 tablet 0   Multiple Vitamin (MULTIVITAMIN WITH MINERALS) TABS tablet Take 1 tablet by mouth daily.     omeprazole (PRILOSEC) 40 MG capsule TAKE 1 CAPSULE (40 MG TOTAL) BY MOUTH DAILY. 30 capsule 0   ondansetron (ZOFRAN) 4 MG tablet Take 1 tablet (4 mg total) by mouth daily as needed for nausea or vomiting. 30 tablet 0   polyethylene glycol (MIRALAX / GLYCOLAX) 17 g packet Take 17 g by mouth daily. 30 each 0   vitamin B-12 (CYANOCOBALAMIN) 1000 MCG tablet Take 1 tablet (1,000 mcg total) by mouth daily. 30 tablet 0   No current facility-administered medications on file prior to visit.    Allergies  Allergen Reactions   Beef-Derived Products     RELIGOUS  REASONS   Pork-Derived Products     RELIGIOUS REASONS   Percocet [Oxycodone-Acetaminophen] Rash    Social History   Socioeconomic History   Marital status: Single    Spouse name: Not on file   Number of children: Not on file   Years of education: Not on file   Highest education level: Not on  file  Occupational History   Not on file  Tobacco Use   Smoking status: Never   Smokeless tobacco: Never  Substance and Sexual Activity   Alcohol use: No   Drug use: No   Sexual activity: Not on file  Other Topics Concern   Not on file  Social History Narrative   Not on file   Social Determinants of Health   Financial Resource Strain: Not on file  Food Insecurity: Not on file  Transportation Needs: Not on file  Physical Activity: Not on file  Stress: Not on file  Social Connections: Not on file  Intimate Partner Violence: Not on file    Family History  Problem Relation Age of Onset   Diabetes Mother    Hyperlipidemia Father  Hypertension Father    Adrenal disorder Neg Hx     Past Surgical History:  Procedure Laterality Date   CESAREAN SECTION     IR THORACENTESIS ASP PLEURAL SPACE W/IMG GUIDE  06/05/2021    ROS: Review of Systems Negative except as stated above  PHYSICAL EXAM: BP (!) 166/58   Pulse 64   Ht 5\' 4"  (1.626 m)   Wt 101 lb 9.6 oz (46.1 kg)   SpO2 96%   BMI 17.44 kg/m   Physical Exam  General appearance - alert, well appearing, older female and in no distress Mental status - normal mood, behavior, speech, dress, motor activity, and thought processes Neck - supple, no significant adenopathy Chest -breath sounds are decreased with some popping heard in the right base Heart - normal rate, regular rhythm, normal S1, S2, no murmurs, rubs, clicks or gallops Musculoskeletal -patient has soft cloth cast extending from the right forearm to the hand including the fourth and fifth digits.  The hand is warm.  Good capillary reflexes of the nailbeds. Extremities -no lower extremity edema   CMP Latest Ref Rng & Units 06/11/2021 06/10/2021 06/09/2021  Glucose 70 - 99 mg/dL 226(H) 228(H) 90  BUN 8 - 23 mg/dL 32(H) 32(H) 30(H)  Creatinine 0.44 - 1.00 mg/dL 3.07(H) 3.32(H) 2.65(H)  Sodium 135 - 145 mmol/L 129(L) 129(L) 133(L)  Potassium 3.5 - 5.1 mmol/L 3.9  4.5 3.7  Chloride 98 - 111 mmol/L 92(L) 89(L) 92(L)  CO2 22 - 32 mmol/L 28 31 32  Calcium 8.9 - 10.3 mg/dL 8.8(L) 8.7(L) 8.7(L)  Total Protein 6.5 - 8.1 g/dL - - -  Total Bilirubin 0.3 - 1.2 mg/dL - - -  Alkaline Phos 38 - 126 U/L - - -  AST 15 - 41 U/L - - -  ALT 0 - 44 U/L - - -   Lipid Panel     Component Value Date/Time   CHOL 218 (H) 06/13/2011 0810   TRIG 85 06/13/2011 0810   HDL 65 06/13/2011 0810   CHOLHDL 3.4 06/13/2011 0810   VLDL 17 06/13/2011 0810   LDLCALC 136 (H) 06/13/2011 0810    CBC    Component Value Date/Time   WBC 4.3 06/09/2021 0036   RBC 2.85 (L) 06/09/2021 0036   HGB 8.2 (L) 06/09/2021 0036   HCT 25.1 (L) 06/09/2021 0036   PLT 187 06/09/2021 0036   MCV 88.1 06/09/2021 0036   MCH 28.8 06/09/2021 0036   MCHC 32.7 06/09/2021 0036   RDW 13.3 06/09/2021 0036   LYMPHSABS 1.2 06/04/2021 0021   MONOABS 0.5 06/04/2021 0021   EOSABS 0.1 06/04/2021 0021   BASOSABS 0.0 06/04/2021 0021    ASSESSMENT AND PLAN: 1. Diabetes mellitus type 2, noninsulin dependent (HCC) A1C is well below 7 even though this may not be as accurate in severe CKD.  However, since she is having hypoglycemia in the 60s, I recommend dec Prandin to once a day with the largest meal of the day - POCT glycosylated hemoglobin (Hb A1C) - Ambulatory referral to Ophthalmology - CBC; Future - Comprehensive metabolic panel; Future  2. Hypertensive kidney disease with chronic kidney disease stage IV ([place]) Not at goal but she is out of Hydralazine.  Rf sent on meds to her pharmacy. Check BP at home at least once a wk with goal being 130/80 or lower.  - amLODipine (NORVASC) 10 MG tablet; TAKE 1 TABLET (10 MG TOTAL) BY MOUTH DAILY.  Dispense: 30 tablet; Refill: 11  3. Chronic  cough X 22mth with recent treatment for bronchitis.  Denies GERD or post-nasal drip symptoms.  Will get CXR and refer to pulmonary for further eval - DG Chest 2 View; Future - Ambulatory referral to Pulmonology  4.  Post-traumatic osteoarthritis of right knee Tolerating Tramadol without major S.E.  NCCSRS reviewed.  No aberrant behavior.  Refill sent on tramadol.  5. Oth fracture of second metacarpal bone, right hand, init Keep upcoming appointment with orthopedics  6. Adrenal insufficiency (HCC) Daughter to call and schedule follow-up appointment with Dr. Loanne Drilling  7. Need for vaccination against Streptococcus pneumoniae - Pneumococcal conjugate vaccine 20-valent  8. Encounter for screening mammogram for malignant neoplasm of breast - MM Digital Screening; Future  9. Screening for colon cancer - Ambulatory referral to Gastroenterology  10. Need for immunization against influenza - Flu Vaccine QUAD 97mo+IM (Fluarix, Fluzone & Alfiuria Quad PF)  11. Other specified hypothyroidism - levothyroxine (SYNTHROID) 150 MCG tablet; TAKE 1 TABLET BY MOUTH DAILY BEFORE BREAKFAST.  Dispense: 30 tablet; Refill: 6    Patient was given the opportunity to ask questions.  Patient verbalized understanding of the plan and was able to repeat key elements of the plan.   Orders Placed This Encounter  Procedures   MM Digital Screening   DG Chest 2 View   Pneumococcal conjugate vaccine 20-valent   Flu Vaccine QUAD 31mo+IM (Fluarix, Fluzone & Alfiuria Quad PF)   CBC   Comprehensive metabolic panel   Ambulatory referral to Ophthalmology   Ambulatory referral to Pulmonology   Ambulatory referral to Gastroenterology   POCT glycosylated hemoglobin (Hb A1C)     Requested Prescriptions   Signed Prescriptions Disp Refills   levothyroxine (SYNTHROID) 150 MCG tablet 30 tablet 6    Sig: TAKE 1 TABLET BY MOUTH DAILY BEFORE BREAKFAST.   repaglinide (PRANDIN) 0.5 MG tablet 60 tablet 11    Sig: 1 tab PO daily before the largest meal of the day   hydrALAZINE (APRESOLINE) 10 MG tablet 90 tablet 6    Sig: Take 1 tablet (10 mg total) by mouth 3 (three) times daily.   torsemide (DEMADEX) 20 MG tablet 60 tablet 1    Sig:  Take 1 tablet (20 mg total) by mouth daily as needed (for weight gain of 3lbs in 1 day or 5 lbs in 2 days.).   amLODipine (NORVASC) 10 MG tablet 30 tablet 11    Sig: TAKE 1 TABLET (10 MG TOTAL) BY MOUTH DAILY.   atorvastatin (LIPITOR) 20 MG tablet 30 tablet 11    Sig: TAKE 1 TABLET (20 MG TOTAL) BY MOUTH DAILY.   isosorbide mononitrate (IMDUR) 60 MG 24 hr tablet 90 tablet 6    Sig: TAKE 1 TABLET BY MOUTH DAILY   traMADol (ULTRAM) 50 MG tablet 60 tablet 1    Sig: Take 1 tablet (50 mg total) by mouth 2 (two) times daily as needed.    Return in about 3 months (around 01/16/2022).  Karle Plumber, MD, FACP

## 2021-10-18 ENCOUNTER — Encounter: Payer: Self-pay | Admitting: Internal Medicine

## 2021-10-19 MED ORDER — ONDANSETRON HCL 4 MG PO TABS: 4.0000 mg | ORAL_TABLET | Freq: Every day | ORAL | 1 refills | Status: DC | PRN

## 2021-10-20 DIAGNOSIS — M25641 Stiffness of right hand, not elsewhere classified: Secondary | ICD-10-CM | POA: Diagnosis not present

## 2021-10-20 DIAGNOSIS — Z419 Encounter for procedure for purposes other than remedying health state, unspecified: Secondary | ICD-10-CM | POA: Diagnosis not present

## 2021-10-26 ENCOUNTER — Encounter: Payer: Self-pay | Admitting: Internal Medicine

## 2021-10-26 MED ORDER — FREESTYLE LIBRE READER DEVI: 1.0000 | Freq: Once | 0 refills | Status: AC

## 2021-10-26 MED ORDER — FREESTYLE LIBRE SENSOR SYSTEM MISC: 12 refills | Status: DC

## 2021-10-27 MED ORDER — ONDANSETRON HCL 4 MG PO TABS
4.0000 mg | ORAL_TABLET | Freq: Every day | ORAL | 3 refills | Status: DC | PRN
Start: 2021-10-27 — End: 2023-05-13

## 2021-10-27 NOTE — Addendum Note (Signed)
Addended by: Karle Plumber B on: 10/27/2021 06:07 PM   Modules accepted: Orders

## 2021-10-28 ENCOUNTER — Telehealth: Payer: Self-pay

## 2021-10-28 ENCOUNTER — Other Ambulatory Visit: Payer: Self-pay

## 2021-10-28 NOTE — Telephone Encounter (Signed)
PA for the [facility] 2 devices were denied, can you resend script for the Accu-chek guide products to patient's pharmacy

## 2021-11-02 ENCOUNTER — Other Ambulatory Visit: Payer: Self-pay | Admitting: Internal Medicine

## 2021-11-02 DIAGNOSIS — Z1231 Encounter for screening mammogram for malignant neoplasm of breast: Secondary | ICD-10-CM

## 2021-11-03 ENCOUNTER — Ambulatory Visit: Payer: MEDICAID | Admitting: Diagnostic Neuroimaging

## 2021-11-05 ENCOUNTER — Encounter: Payer: Self-pay | Admitting: Internal Medicine

## 2021-11-09 ENCOUNTER — Other Ambulatory Visit: Payer: Self-pay | Admitting: Internal Medicine

## 2021-11-09 DIAGNOSIS — N184 Chronic kidney disease, stage 4 (severe): Secondary | ICD-10-CM | POA: Diagnosis not present

## 2021-11-09 DIAGNOSIS — E1122 Type 2 diabetes mellitus with diabetic chronic kidney disease: Secondary | ICD-10-CM | POA: Diagnosis not present

## 2021-11-09 DIAGNOSIS — N2581 Secondary hyperparathyroidism of renal origin: Secondary | ICD-10-CM | POA: Diagnosis not present

## 2021-11-09 DIAGNOSIS — I129 Hypertensive chronic kidney disease with stage 1 through stage 4 chronic kidney disease, or unspecified chronic kidney disease: Secondary | ICD-10-CM | POA: Diagnosis not present

## 2021-11-09 DIAGNOSIS — I701 Atherosclerosis of renal artery: Secondary | ICD-10-CM | POA: Diagnosis not present

## 2021-11-09 DIAGNOSIS — D649 Anemia, unspecified: Secondary | ICD-10-CM | POA: Diagnosis not present

## 2021-11-09 MED ORDER — SERTRALINE HCL 50 MG PO TABS: 25.0000 mg | ORAL_TABLET | Freq: Every day | ORAL | 1 refills | Status: DC

## 2021-11-16 DIAGNOSIS — N189 Chronic kidney disease, unspecified: Secondary | ICD-10-CM | POA: Diagnosis not present

## 2021-11-19 ENCOUNTER — Encounter: Payer: Self-pay | Admitting: Internal Medicine

## 2021-11-19 DIAGNOSIS — E119 Type 2 diabetes mellitus without complications: Secondary | ICD-10-CM

## 2021-11-19 DIAGNOSIS — M1731 Unilateral post-traumatic osteoarthritis, right knee: Secondary | ICD-10-CM

## 2021-11-19 DIAGNOSIS — I129 Hypertensive chronic kidney disease with stage 1 through stage 4 chronic kidney disease, or unspecified chronic kidney disease: Secondary | ICD-10-CM

## 2021-11-19 DIAGNOSIS — Z419 Encounter for procedure for purposes other than remedying health state, unspecified: Secondary | ICD-10-CM | POA: Diagnosis not present

## 2021-11-19 DIAGNOSIS — D509 Iron deficiency anemia, unspecified: Secondary | ICD-10-CM

## 2021-11-20 DIAGNOSIS — H903 Sensorineural hearing loss, bilateral: Secondary | ICD-10-CM | POA: Diagnosis not present

## 2021-11-23 MED ORDER — ACCU-CHEK GUIDE W/DEVICE KIT: PACK | 0 refills | Status: DC

## 2021-11-23 MED ORDER — ACCU-CHEK SOFTCLIX LANCETS MISC: 12 refills | Status: AC

## 2021-11-23 MED ORDER — ACCU-CHEK GUIDE VI STRP: ORAL_STRIP | 12 refills | Status: DC

## 2021-11-23 NOTE — Addendum Note (Signed)
Addended by: Karle Plumber B on: 11/23/2021 06:28 PM   Modules accepted: Orders

## 2021-11-25 ENCOUNTER — Encounter: Payer: Self-pay | Admitting: Internal Medicine

## 2021-11-25 NOTE — Progress Notes (Signed)
Seen by her nephrologist Dr. Johnney Ou on 11/09/2021. Stage IV CKD.  Renal artery duplex with bilateral RAS. BUN 15 creatinine 2.2 GFR 27 Bilateral RAS on duplex 02/2021.  Proceed with vascular surgery consult for discussion reintervention but she missed the visit but is going to reschedule.  Avoiding ACE inhibitors/ARB Iron deficient noted 11/2020 and hemoglobin between 7-8.  P.o. iron causes severe constipation for her.  Apparently patient told her nephrologist that I was going to arrange for IV iron.  Not required ESA.  Hemoglobin 9.1/creatinine 27.1 Intact PTH 63

## 2021-11-28 ENCOUNTER — Other Ambulatory Visit: Payer: Self-pay

## 2021-11-29 DIAGNOSIS — K5900 Constipation, unspecified: Secondary | ICD-10-CM | POA: Diagnosis not present

## 2021-11-29 DIAGNOSIS — Z743 Need for continuous supervision: Secondary | ICD-10-CM | POA: Diagnosis not present

## 2021-11-29 DIAGNOSIS — R197 Diarrhea, unspecified: Secondary | ICD-10-CM | POA: Diagnosis not present

## 2021-11-29 DIAGNOSIS — R404 Transient alteration of awareness: Secondary | ICD-10-CM | POA: Diagnosis not present

## 2021-11-29 DIAGNOSIS — E162 Hypoglycemia, unspecified: Secondary | ICD-10-CM | POA: Diagnosis not present

## 2021-11-30 ENCOUNTER — Ambulatory Visit (INDEPENDENT_AMBULATORY_CARE_PROVIDER_SITE_OTHER): Payer: Medicaid Other | Admitting: Endocrinology

## 2021-11-30 ENCOUNTER — Other Ambulatory Visit: Payer: Self-pay

## 2021-11-30 VITALS — BP 140/40 | HR 66 | Ht 64.0 in | Wt 92.4 lb

## 2021-11-30 DIAGNOSIS — E11649 Type 2 diabetes mellitus with hypoglycemia without coma: Secondary | ICD-10-CM | POA: Diagnosis not present

## 2021-11-30 MED ORDER — HYDROCORTISONE 5 MG PO TABS: 10.0000 mg | ORAL_TABLET | Freq: Every morning | ORAL | 0 refills | Status: DC

## 2021-11-30 NOTE — Progress Notes (Signed)
Subjective:    Patient ID: [name], female    DOB: Aug 26, 1961, 60 y.o.   MRN: [id number]  HPI Pt returns for f/u of low cortisol (dx'ed 2012; she took cortef until normal ACTH and cortisol off rx in 2022; she has labile HTN).  She was restarted on cortef mid-2022 She also has hypothyroidism Pt also has DM: DM type: 2 Dx'ed: 2012 Complications: none Therapy: no medication now GDM: 1990 DKA: never Severe hypoglycemia: never Pancreatitis: never Pancreatic imaging: (2022 CT): mild atrophy SDOH: none Other: she has never been on insulin Interval history: She had an episode of cbg in the 50's.  911 was called.  Dtr in law says this was due to incorrect timing of taking repaglinide.  Past Medical History:  Diagnosis Date   Adrenal insufficiency, primary (HCC)    Anemia 2018   Hgb 6.8 early 11/2020.  PRBC x 1.  06/2017 PRBC x 1.     CHF (congestive heart failure) (HCC) 11/2021   Diastolic   CKD (chronic kidney disease) stage 4, GFR 15-29 ml/min (HCC) 04/2021   Stage 4 as of late May 2022   Diabetes mellitus without complication (HCC)    Hypertension    Hypothyroidism     Past Surgical History:  Procedure Laterality Date   CESAREAN SECTION     IR THORACENTESIS ASP PLEURAL SPACE W/IMG GUIDE  06/05/2021    Social History   Socioeconomic History   Marital status: Single    Spouse name: Not on file   Number of children: Not on file   Years of education: Not on file   Highest education level: Not on file  Occupational History   Not on file  Tobacco Use   Smoking status: Never   Smokeless tobacco: Never  Substance and Sexual Activity   Alcohol use: No   Drug use: No   Sexual activity: Not on file  Other Topics Concern   Not on file  Social History Narrative   Not on file   Social Determinants of Health   Financial Resource Strain: Not on file  Food Insecurity: Not on file  Transportation Needs: Not on file  Physical Activity: Not on file  Stress: Not on  file  Social Connections: Not on file  Intimate Partner Violence: Not on file    Current Outpatient Medications on File Prior to Visit  Medication Sig Dispense Refill   Accu-Chek Softclix Lancets lancets Use as instructed 100 each 12   acetaminophen (TYLENOL) 325 MG tablet Take 2 tablets (650 mg total) by mouth every 6 (six) hours as needed for up to 30 doses for moderate pain or mild pain. 30 tablet 0   amLODipine (NORVASC) 10 MG tablet TAKE 1 TABLET (10 MG TOTAL) BY MOUTH DAILY. 30 tablet 11   aspirin 81 MG chewable tablet Chew 81 mg by mouth daily.     atorvastatin (LIPITOR) 20 MG tablet TAKE 1 TABLET (20 MG TOTAL) BY MOUTH DAILY. 30 tablet 11   Blood Glucose Monitoring Suppl (ACCU-CHEK GUIDE) w/Device KIT UAD 1 kit 0   Calcium Carbonate-Vit D-Min (CALCIUM 1200 PO) Take 1,200 mg by mouth daily.     Continuous Blood Gluc Sensor (FREESTYLE LIBRE SENSOR SYSTEM) MISC Change sensor Q 2 wks 2 each 12   Continuous Blood Gluc Sensor (FREESTYLE LIBRE SENSOR SYSTEM) MISC Change sensor Q 2 wks 2 each 12   glucose blood (ACCU-CHEK GUIDE) test strip Use as instructed 100 each 12   hydrALAZINE (APRESOLINE) 10 MG  tablet Take 1 tablet (10 mg total) by mouth 3 (three) times daily. 90 tablet 6   isosorbide mononitrate (IMDUR) 60 MG 24 hr tablet TAKE 1 TABLET BY MOUTH DAILY 90 tablet 6   levothyroxine (SYNTHROID) 150 MCG tablet TAKE 1 TABLET BY MOUTH DAILY BEFORE BREAKFAST. 30 tablet 6   Multiple Vitamin (MULTIVITAMIN WITH MINERALS) TABS tablet Take 1 tablet by mouth daily.     omeprazole (PRILOSEC) 40 MG capsule TAKE 1 CAPSULE (40 MG TOTAL) BY MOUTH DAILY. 30 capsule 0   ondansetron (ZOFRAN) 4 MG tablet Take 1 tablet (4 mg total) by mouth daily as needed for nausea or vomiting. 30 tablet 3   polyethylene glycol (MIRALAX / GLYCOLAX) 17 g packet Take 17 g by mouth daily. 30 each 0   sertraline (ZOLOFT) 50 MG tablet Take 0.5 tablets (25 mg total) by mouth daily. 30 tablet 1   torsemide (DEMADEX) 20 MG tablet  Take 1 tablet (20 mg total) by mouth daily as needed (for weight gain of 3lbs in 1 day or 5 lbs in 2 days.). 60 tablet 1   traMADol (ULTRAM) 50 MG tablet Take 1 tablet (50 mg total) by mouth 2 (two) times daily as needed. 60 tablet 1   vitamin B-12 (CYANOCOBALAMIN) 1000 MCG tablet Take 1 tablet (1,000 mcg total) by mouth daily. 30 tablet 0   No current facility-administered medications on file prior to visit.    Allergies  Allergen Reactions   Beef-Derived Products     RELIGOUS  REASONS   Pork-Derived Products     RELIGIOUS REASONS   Percocet [Oxycodone-Acetaminophen] Rash    Family History  Problem Relation Age of Onset   Diabetes Mother    Hyperlipidemia Father    Hypertension Father    Adrenal disorder Neg Hx     BP (!) 140/40   Pulse 66   Ht 5\' 4"  (1.626 m)   Wt 92 lb 6.4 oz (41.9 kg)   SpO2 99%   BMI 15.86 kg/m   Review of Systems     Objective:   Physical Exam VITAL SIGNS:  See vs page GENERAL: no distress NECK: There is no palpable thyroid enlargement.  No thyroid nodule is palpable.  No palpable lymphadenopathy at the anterior neck.   Lab Results  Component Value Date   TSH 1.738 06/02/2021   T3TOTAL 22.6 (L) 05/11/2011   T4TOTAL 0.4 (L) 05/11/2011   Lab Results  Component Value Date   HGBA1C 5.3 10/16/2021      Assessment & Plan:  Type 2 DM: overcontrolled Low cortisol: Plan is to re-taper cortef, and retest.   Hypothyroidism: well-controlled.  Please continue the same synthroid.   Patient Instructions  I have sent a prescription to your pharmacy, to reduce the hydrocortisone to 10 mg in the morning, and none in the evening. Please also stop taking the repaglinide. check your blood sugar once a day.  vary the time of day when you check, between before the 3 meals, and at bedtime.  also check if you have symptoms of your blood sugar being too high or too low.  please keep a record of the readings and bring it to your next appointment here (or you  can bring the meter itself).  You can write it on any piece of paper.  please call 10/18/2021 sooner if your blood sugar goes below 70, or if most of your readings are over 200. Please come back for a follow-up appointment in 6 weeks.

## 2021-11-30 NOTE — Patient Instructions (Addendum)
I have sent a prescription to your pharmacy, to reduce the hydrocortisone to 10 mg in the morning, and none in the evening. Please also stop taking the repaglinide. check your blood sugar once a day.  vary the time of day when you check, between before the 3 meals, and at bedtime.  also check if you have symptoms of your blood sugar being too high or too low.  please keep a record of the readings and bring it to your next appointment here (or you can bring the meter itself).  You can write it on any piece of paper.  please call us sooner if your blood sugar goes below 70, or if most of your readings are over 200. Please come back for a follow-up appointment in 6 weeks.

## 2021-12-08 ENCOUNTER — Telehealth: Payer: Self-pay | Admitting: Internal Medicine

## 2021-12-08 ENCOUNTER — Telehealth: Payer: Self-pay | Admitting: *Deleted

## 2021-12-08 NOTE — Telephone Encounter (Signed)
Patient verified DOB Patients daughter is aware of needing to contact oncology [facility] back to schedule iron injections for patient.

## 2021-12-08 NOTE — Telephone Encounter (Signed)
Melissa called from [facility] states, has been unable to reach patient for an appt, so they are closing out the referral.

## 2021-12-08 NOTE — Telephone Encounter (Signed)
MA spoke with Melissa and informed her of being in contact with patients daughter today at 3pm. Patients daughter is aware of contact information for [facility] and needing to contact them ASAP for consult or referral will be closed.

## 2021-12-09 ENCOUNTER — Ambulatory Visit: Payer: Medicaid Other

## 2021-12-10 ENCOUNTER — Other Ambulatory Visit: Payer: Self-pay

## 2021-12-10 ENCOUNTER — Emergency Department (HOSPITAL_BASED_OUTPATIENT_CLINIC_OR_DEPARTMENT_OTHER)
Admission: EM | Admit: 2021-12-10 | Discharge: 2021-12-11 | Disposition: A | Discharge status: Home / Self Care | Payer: Medicaid Other | Attending: Emergency Medicine | Admitting: Emergency Medicine

## 2021-12-10 ENCOUNTER — Emergency Department (HOSPITAL_BASED_OUTPATIENT_CLINIC_OR_DEPARTMENT_OTHER): Payer: Medicaid Other | Admitting: Radiology

## 2021-12-10 ENCOUNTER — Encounter (HOSPITAL_BASED_OUTPATIENT_CLINIC_OR_DEPARTMENT_OTHER): Payer: Self-pay

## 2021-12-10 DIAGNOSIS — E039 Hypothyroidism, unspecified: Secondary | ICD-10-CM | POA: Insufficient documentation

## 2021-12-10 DIAGNOSIS — I130 Hypertensive heart and chronic kidney disease with heart failure and stage 1 through stage 4 chronic kidney disease, or unspecified chronic kidney disease: Secondary | ICD-10-CM | POA: Insufficient documentation

## 2021-12-10 DIAGNOSIS — I5033 Acute on chronic diastolic (congestive) heart failure: Secondary | ICD-10-CM | POA: Insufficient documentation

## 2021-12-10 DIAGNOSIS — D649 Anemia, unspecified: Secondary | ICD-10-CM | POA: Diagnosis not present

## 2021-12-10 DIAGNOSIS — N184 Chronic kidney disease, stage 4 (severe): Secondary | ICD-10-CM | POA: Insufficient documentation

## 2021-12-10 DIAGNOSIS — R059 Cough, unspecified: Secondary | ICD-10-CM | POA: Diagnosis not present

## 2021-12-10 DIAGNOSIS — Z79899 Other long term (current) drug therapy: Secondary | ICD-10-CM | POA: Insufficient documentation

## 2021-12-10 DIAGNOSIS — I517 Cardiomegaly: Secondary | ICD-10-CM | POA: Diagnosis not present

## 2021-12-10 DIAGNOSIS — R079 Chest pain, unspecified: Secondary | ICD-10-CM | POA: Diagnosis not present

## 2021-12-10 DIAGNOSIS — R0789 Other chest pain: Secondary | ICD-10-CM | POA: Diagnosis not present

## 2021-12-10 DIAGNOSIS — E1122 Type 2 diabetes mellitus with diabetic chronic kidney disease: Secondary | ICD-10-CM | POA: Insufficient documentation

## 2021-12-10 DIAGNOSIS — Z20822 Contact with and (suspected) exposure to covid-19: Secondary | ICD-10-CM | POA: Insufficient documentation

## 2021-12-10 DIAGNOSIS — Z7982 Long term (current) use of aspirin: Secondary | ICD-10-CM | POA: Insufficient documentation

## 2021-12-10 DIAGNOSIS — R051 Acute cough: Secondary | ICD-10-CM

## 2021-12-10 LAB — BASIC METABOLIC PANEL
Anion gap: 6 (ref 5–15)
BUN: 38 mg/dL — ABNORMAL HIGH (ref 8–23)
CO2: 26 mmol/L (ref 22–32)
Calcium: 8.8 mg/dL — ABNORMAL LOW (ref 8.9–10.3)
Chloride: 100 mmol/L (ref 98–111)
Creatinine, Ser: 2.65 mg/dL — ABNORMAL HIGH (ref 0.44–1.00)
GFR, Estimated: 20 mL/min — ABNORMAL LOW (ref >60)
Glucose, Bld: 75 mg/dL (ref 70–99)
Potassium: 4 mmol/L (ref 3.5–5.1)
Sodium: 132 mmol/L — ABNORMAL LOW (ref 135–145)

## 2021-12-10 LAB — CBC
HCT: 28.1 % — ABNORMAL LOW (ref 36.0–46.0)
Hemoglobin: 9.1 g/dL — ABNORMAL LOW (ref 12.0–15.0)
MCH: 27.6 pg (ref 26.0–34.0)
MCHC: 32.4 g/dL (ref 30.0–36.0)
MCV: 85.2 fL (ref 80.0–100.0)
Platelets: 131 10*3/uL — ABNORMAL LOW (ref 150–400)
RBC: 3.3 MIL/uL — ABNORMAL LOW (ref 3.87–5.11)
RDW: 14.7 % (ref 11.5–15.5)
WBC: 8.8 10*3/uL (ref 4.0–10.5)
nRBC: 0 % (ref 0.0–0.2)

## 2021-12-10 LAB — CBG MONITORING, ED: Glucose-Capillary: 68 mg/dL — ABNORMAL LOW (ref 70–99)

## 2021-12-10 LAB — TROPONIN I (HIGH SENSITIVITY): Troponin I (High Sensitivity): 14 ng/L

## 2021-12-10 MED ORDER — TRAMADOL HCL 50 MG PO TABS
50.0000 mg | ORAL_TABLET | Freq: Once | ORAL | Status: AC
  Administered 2021-12-11: 50 mg via ORAL
  Filled 2021-12-10: qty 1

## 2021-12-10 NOTE — ED Provider Notes (Signed)
[facility] EMERGENCY DEPT Provider Note   CSN: [id number] Arrival date & time: 12/10/21  2159     History Chief Complaint  Patient presents with   Chest Pain    [name] is a 60 y.o. female.  Patient presents with chest pain.  She has a history of diabetes, chronic kidney disease, hypertension, hyperlipidemia and diastolic heart failure.  She said for about the last week she has had a cough.  It is mostly nonproductive.  She is felt worse over the last 2 days with fevers and myalgias.  She said that she is achy all over.  She she has had a headache for the last 2 days.  She has felt weak and tired for the last couple of days.  She woke up today with pain in the center of her chest.  She says it feels better now.  She initially denied shortness of breath although she says sometimes she feels short of breath.  The chest pain is worse with movement and worse with coughing.  No leg pain or swelling.  No nausea or vomiting.  No exertional symptoms.      Past Medical History:  Diagnosis Date   Adrenal insufficiency, primary ([place])    Anemia 2018   Hgb 6.8 early 11/2020.  PRBC x 1.  06/2017 PRBC x 1.     CHF (congestive heart failure) ([place]) 47/0962   Diastolic   CKD (chronic kidney disease) stage 4, GFR 15-29 ml/min ([place]) 04/2021   Stage 4 as of late May 2022   Diabetes mellitus without complication ([place])    Hypertension    Hypothyroidism     Patient Active Problem List   Diagnosis Date Noted   Protein-calorie malnutrition, severe 06/06/2021   Bacteremia due to methicillin susceptible Staphylococcus aureus (MSSA) 06/05/2021   CHF exacerbation (HCC) 06/05/2021   Volume overload 06/05/2021   Respiratory failure ([place]) 06/05/2021   AKI (acute kidney injury) ([place]) 06/01/2021   Acute on chronic diastolic CHF (congestive heart failure) ([place]) 06/01/2021   DM2 (diabetes mellitus, type 2) ([place]) 06/01/2021   Dizziness 05/13/2021   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    Hypokalemia 05/12/2021   Fecal impaction ([place]) 05/12/2021   Anemia due to chronic kidney disease 05/12/2021   Adrenal insufficiency (Addison's disease) ([place]) 05/12/2021   CKD (chronic kidney disease) stage 4, GFR 15-29 ml/min ([place]) 11/21/2020   HTN (hypertension) 11/21/2020   New onset of congestive heart failure ([place]) 11/21/2020   Dehydration 12/09/2017   Abdominal pain, bilateral lower quadrant 12/09/2017   Hypoglycemia 07/08/2017   Hypoglycemia associated with diabetes ([place]) 05/10/2017   Malnutrition of moderate degree 05/10/2017    Past Surgical History:  Procedure Laterality Date   CESAREAN SECTION     IR THORACENTESIS ASP PLEURAL SPACE W/IMG GUIDE  06/05/2021     OB History   No obstetric history on file.     Family History  Problem Relation Age of Onset   Diabetes Mother    Hyperlipidemia Father    Hypertension Father    Adrenal disorder Neg Hx     Social History   Tobacco Use   Smoking status: Never   Smokeless tobacco: Never  Vaping Use   Vaping Use: Never used  Substance Use Topics   Alcohol use: No   Drug use: No    Home Medications Prior to Admission medications   Medication Sig Start Date End Date Taking? Authorizing Provider  Accu-Chek Softclix Lancets lancets Use as instructed 11/23/21   Wynetta Emery,  Dalbert Batman, MD  acetaminophen (TYLENOL) 325 MG tablet Take 2 tablets (650 mg total) by mouth every 6 (six) hours as needed for up to 30 doses for moderate pain or mild pain. 09/19/21   Wyvonnia Dusky, MD  amLODipine (NORVASC) 10 MG tablet TAKE 1 TABLET (10 MG TOTAL) BY MOUTH DAILY. 10/16/21 10/16/22  Ladell Pier, MD  aspirin 81 MG chewable tablet Chew 81 mg by mouth daily.    [provider]  atorvastatin (LIPITOR) 20 MG tablet TAKE 1 TABLET (20 MG TOTAL) BY MOUTH DAILY. 10/16/21 10/16/22  Ladell Pier, MD  Blood Glucose Monitoring Suppl (ACCU-CHEK GUIDE) w/Device KIT UAD 11/23/21   Ladell Pier, MD  Calcium Carbonate-Vit D-Min  (CALCIUM 1200 PO) Take 1,200 mg by mouth daily.    [provider]  Continuous Blood Gluc Sensor (FREESTYLE LIBRE SENSOR SYSTEM) MISC Change sensor Q 2 wks 07/19/21   Ladell Pier, MD  Continuous Blood Gluc Sensor (FREESTYLE LIBRE SENSOR SYSTEM) MISC Change sensor Q 2 wks 10/26/21   Ladell Pier, MD  glucose blood (ACCU-CHEK GUIDE) test strip Use as instructed 11/23/21   Ladell Pier, MD  hydrALAZINE (APRESOLINE) 10 MG tablet Take 1 tablet (10 mg total) by mouth 3 (three) times daily. 10/16/21   Ladell Pier, MD  hydrocortisone (CORTEF) 5 MG tablet Take 2 tablets (10 mg total) by mouth in the morning. 11/30/21   Renato Shin, MD  isosorbide mononitrate (IMDUR) 60 MG 24 hr tablet TAKE 1 TABLET BY MOUTH DAILY 10/16/21 10/16/22  Ladell Pier, MD  levothyroxine (SYNTHROID) 150 MCG tablet TAKE 1 TABLET BY MOUTH DAILY BEFORE BREAKFAST. 10/16/21 10/16/22  Ladell Pier, MD  Multiple Vitamin (MULTIVITAMIN WITH MINERALS) TABS tablet Take 1 tablet by mouth daily.    [provider]  omeprazole (PRILOSEC) 40 MG capsule TAKE 1 CAPSULE (40 MG TOTAL) BY MOUTH DAILY. 08/31/21 08/31/22  Ladell Pier, MD  ondansetron (ZOFRAN) 4 MG tablet Take 1 tablet (4 mg total) by mouth daily as needed for nausea or vomiting. 10/27/21   Ladell Pier, MD  polyethylene glycol (MIRALAX / GLYCOLAX) 17 g packet Take 17 g by mouth daily. 05/17/21   Ghimire, Henreitta Leber, MD  sertraline (ZOLOFT) 50 MG tablet Take 0.5 tablets (25 mg total) by mouth daily. 11/09/21   Ladell Pier, MD  torsemide (DEMADEX) 20 MG tablet Take 1 tablet (20 mg total) by mouth daily as needed (for weight gain of 3lbs in 1 day or 5 lbs in 2 days.). 10/16/21   Ladell Pier, MD  traMADol (ULTRAM) 50 MG tablet Take 1 tablet (50 mg total) by mouth 2 (two) times daily as needed. 10/16/21   Ladell Pier, MD  vitamin B-12 (CYANOCOBALAMIN) 1000 MCG tablet Take 1 tablet (1,000 mcg total) by mouth  daily. 05/16/21   Ghimire, Henreitta Leber, MD    Allergies    Beef-derived products, Pork-derived products, and Percocet [oxycodone-acetaminophen]  Review of Systems   Review of Systems  Constitutional:  Positive for fatigue and fever. Negative for chills and diaphoresis.  HENT:  Positive for congestion and rhinorrhea. Negative for sneezing.   Eyes: Negative.   Respiratory:  Positive for cough and shortness of breath. Negative for chest tightness.   Cardiovascular:  Positive for chest pain. Negative for leg swelling.  Gastrointestinal:  Negative for abdominal pain, blood in stool, diarrhea, nausea and vomiting.  Genitourinary:  Negative for difficulty urinating, flank pain, frequency and hematuria.  Musculoskeletal:  Positive for myalgias. Negative for arthralgias and back pain.  Skin:  Negative for rash.  Neurological:  Positive for headaches. Negative for dizziness, speech difficulty, weakness and numbness.   Physical Exam Updated Vital Signs BP (!) 165/49    Pulse (!) 55    Temp 98.2 F (36.8 C)    Resp 15    Ht $R'5\' 4"'sh$  (1.626 m)    Wt 43.1 kg    SpO2 96%    BMI 16.31 kg/m   Physical Exam Constitutional:      Appearance: She is well-developed.  HENT:     Head: Normocephalic and atraumatic.  Eyes:     Pupils: Pupils are equal, round, and reactive to light.  Cardiovascular:     Rate and Rhythm: Normal rate and regular rhythm.     Heart sounds: Normal heart sounds.  Pulmonary:     Effort: Pulmonary effort is normal. No respiratory distress.     Breath sounds: Normal breath sounds. No wheezing or rales.  Chest:     Chest wall: Tenderness (Positive reproducible tenderness across the chest wall bilaterally, no crepitus or deformity) present.  Abdominal:     General: Bowel sounds are normal.     Palpations: Abdomen is soft.     Tenderness: There is no abdominal tenderness. There is no guarding or rebound.  Musculoskeletal:        General: Normal range of motion.     Cervical back:  Normal range of motion and neck supple.     Comments: No edema or calf tenderness  Lymphadenopathy:     Cervical: No cervical adenopathy.  Skin:    General: Skin is warm and dry.     Findings: No rash.  Neurological:     Mental Status: She is alert and oriented to person, place, and time.    ED Results / Procedures / Treatments   Labs (all labs ordered are listed, but only abnormal results are displayed) Labs Reviewed  BASIC METABOLIC PANEL - Abnormal; Notable for the following components:      Result Value   Sodium 132 (*)    BUN 38 (*)    Creatinine, Ser 2.65 (*)    Calcium 8.8 (*)    GFR, Estimated 20 (*)    All other components within normal limits  CBC - Abnormal; Notable for the following components:   RBC 3.30 (*)    Hemoglobin 9.1 (*)    HCT 28.1 (*)    Platelets 131 (*)    All other components within normal limits  URINALYSIS, ROUTINE W REFLEX MICROSCOPIC - Abnormal; Notable for the following components:   Hgb urine dipstick TRACE (*)    Protein, ur TRACE (*)    Leukocytes,Ua SMALL (*)    All other components within normal limits  CBG MONITORING, ED - Abnormal; Notable for the following components:   Glucose-Capillary 68 (*)    All other components within normal limits  RESP PANEL BY RT-PCR (FLU A&B, COVID) ARPGX2  TROPONIN I (HIGH SENSITIVITY)  TROPONIN I (HIGH SENSITIVITY)    EKG EKG Interpretation  Date/Time:  Thursday December 10 2021 22:08:51 EST Ventricular Rate:  66 PR Interval:  166 QRS Duration: 90 QT Interval:  418 QTC Calculation: 438 R Axis:   61 Text Interpretation: Normal sinus rhythm Left ventricular hypertrophy Non-specific ST-t changes Confirmed by Lajean Saver [phone]) on 12/10/2021 10:17:05 PM  Radiology DG Chest 2 View  Result Date: 12/10/2021 CLINICAL DATA:  Chest pain. EXAM: CHEST -  2 VIEW COMPARISON:  Chest x-ray 06/05/2021 FINDINGS: The heart is enlarged, unchanged. There is no lung consolidation, pleural effusion or  pneumothorax identified. No acute fractures are seen. IMPRESSION: 1. No acute cardiopulmonary process. 2. Stable cardiomegaly. Electronically Signed   By: Ronney Asters M.D.   On: 12/10/2021 23:04    Procedures Procedures   Medications Ordered in ED Medications  traMADol (ULTRAM) tablet 50 mg (50 mg Oral Given 12/11/21 0019)    ED Course  I have reviewed the triage vital signs and the nursing notes.  Pertinent labs & imaging results that were available during my care of the patient were reviewed by me and considered in my medical decision making (see chart for details).    MDM Rules/Calculators/A&P                         Patient is a 60 year old female who presents with a cough in association with some recent fevers and myalgias.  She had some associated chest pain.  Her pain is reproducible on palpation of her chest wall.  She does not have hypoxia, tachycardia or other symptoms that would be more concerning for PE.  Her chest x-ray is clear without evidence of pneumonia.  Her COVID and flu test are negative.  Her other labs are nonconcerning.  Her creatinine is elevated but similar to prior values on chart review.  Her troponin is negative x2.  No ischemic changes on EKG.  Her urine is not consistent with infection.  She is overall well-appearing and feels much better after dose of tramadol which she does take at home.  She was discharged home in good condition.  She was advised to follow-up with her PCP if her symptoms are improving.  Return precautions were given.    Final Clinical Impression(s) / ED Diagnoses Final diagnoses:  Acute cough  Chest pain, unspecified type    Rx / DC Orders ED Discharge Orders     None        Malvin Johns, MD 12/11/21 [phone]

## 2021-12-10 NOTE — ED Triage Notes (Signed)
Pt was awakened from sleep with a dull, mid sternal chest pain. Denies ShOB or nausea.

## 2021-12-11 DIAGNOSIS — E161 Other hypoglycemia: Secondary | ICD-10-CM | POA: Diagnosis not present

## 2021-12-11 DIAGNOSIS — R6889 Other general symptoms and signs: Secondary | ICD-10-CM | POA: Diagnosis not present

## 2021-12-11 DIAGNOSIS — R079 Chest pain, unspecified: Secondary | ICD-10-CM | POA: Diagnosis not present

## 2021-12-11 DIAGNOSIS — E162 Hypoglycemia, unspecified: Secondary | ICD-10-CM | POA: Diagnosis not present

## 2021-12-11 DIAGNOSIS — F29 Unspecified psychosis not due to a substance or known physiological condition: Secondary | ICD-10-CM | POA: Diagnosis not present

## 2021-12-11 LAB — URINALYSIS, ROUTINE W REFLEX MICROSCOPIC
Bilirubin Urine: NEGATIVE
Glucose, UA: NEGATIVE mg/dL
Ketones, ur: NEGATIVE mg/dL
Nitrite: NEGATIVE
Specific Gravity, Urine: 1.015 (ref 1.005–1.030)
Trans Epithel, UA: <1
pH: 7 (ref 5.0–8.0)

## 2021-12-11 LAB — RESP PANEL BY RT-PCR (FLU A&B, COVID) ARPGX2
Influenza A by PCR: NEGATIVE
Influenza B by PCR: NEGATIVE
SARS Coronavirus 2 by RT PCR: NEGATIVE

## 2021-12-11 LAB — TROPONIN I (HIGH SENSITIVITY): Troponin I (High Sensitivity): 12 ng/L (ref <18)

## 2021-12-11 NOTE — ED Notes (Signed)
Pt assisted to bathroom and back to bed 

## 2021-12-13 ENCOUNTER — Encounter: Payer: Self-pay | Admitting: Internal Medicine

## 2021-12-15 ENCOUNTER — Other Ambulatory Visit: Payer: Self-pay | Admitting: Internal Medicine

## 2021-12-15 DIAGNOSIS — E2740 Unspecified adrenocortical insufficiency: Secondary | ICD-10-CM

## 2021-12-15 DIAGNOSIS — E11649 Type 2 diabetes mellitus with hypoglycemia without coma: Secondary | ICD-10-CM

## 2021-12-17 ENCOUNTER — Encounter: Payer: Self-pay | Admitting: Endocrinology

## 2021-12-20 DIAGNOSIS — Z419 Encounter for procedure for purposes other than remedying health state, unspecified: Secondary | ICD-10-CM | POA: Diagnosis not present

## 2022-01-01 ENCOUNTER — Encounter: Payer: Self-pay | Admitting: Internal Medicine

## 2022-01-01 ENCOUNTER — Other Ambulatory Visit: Payer: Self-pay | Admitting: Internal Medicine

## 2022-01-01 NOTE — Telephone Encounter (Signed)
Requested medication (s) are due for refill today: see notes  Requested medication (s) are on the active medication list: yes  Last refill:  11/09/21 #30 1 refill  Future visit scheduled: yes in 2 weeks   Notes to clinic:  Pharmacy comment: [place].     Requested Prescriptions  Pending Prescriptions Disp Refills   sertraline (ZOLOFT) 50 MG tablet [Pharmacy Med Name: SERTRALINE HCL 50 MG TABLET] 45 tablet 2    Sig: TAKE 1/2 TABLET BY MOUTH DAILY     Psychiatry:  Antidepressants - SSRI Passed - 01/01/2022 10:32 AM      Passed - Valid encounter within last 6 months    Recent Outpatient Visits           2 months ago Diabetes mellitus type 2, noninsulin dependent ([place])   [place] Ladell Pier, MD   5 months ago Hospital discharge follow-up   [place], MD   9 months ago Hypertensive kidney disease with chronic kidney disease stage [facility])   [place], MD   1 year ago Chronic diastolic congestive heart failure [facility])   [place], [place], MD   1 year ago Hypoglycemia associated with diabetes [facility])   [place] Charlott Rakes, MD       Future Appointments             In 2 weeks [place] Emery Dalbert Batman, MD [place]   In 1 month Buford Dresser, MD [facility]

## 2022-01-01 NOTE — Telephone Encounter (Signed)
°  Notes to clinic:  Note from pharmacy Maximum MME cannot be calculated for this prescription. Enter discrete sig details to calculate maximum MME.    Requested Prescriptions  Pending Prescriptions Disp Refills   traMADol (ULTRAM) 50 MG tablet [Pharmacy Med Name: TRAMADOL HCL 50 MG TABLET] 60 tablet 1    Sig: TAKE 1 TABLET BY MOUTH 2 TIMES DAILY AS NEEDED.     Not Delegated - Analgesics:  Opioid Agonists Failed - 01/01/2022  5:38 PM      Failed - This refill cannot be delegated      Passed - Urine Drug Screen completed in last 360 days      Passed - Valid encounter within last 6 months    Recent Outpatient Visits           2 months ago Diabetes mellitus type 2, noninsulin dependent ([place])   [place] Ladell Pier, MD   5 months ago Hospital discharge follow-up   [place], MD   9 months ago Hypertensive kidney disease with chronic kidney disease stage [facility])   [place], MD   1 year ago Chronic diastolic congestive heart failure [facility])   [place], Enobong, MD   1 year ago Hypoglycemia associated with diabetes [facility])   [place] Charlott Rakes, MD       Future Appointments             In 2 weeks [place] Emery Dalbert Batman, MD [place]   In 1 month Buford Dresser, MD [facility]

## 2022-01-04 MED ORDER — TRAMADOL HCL 50 MG PO TABS: 50.0000 mg | ORAL_TABLET | Freq: Two times a day (BID) | ORAL | 1 refills | Status: DC | PRN

## 2022-01-06 ENCOUNTER — Encounter: Payer: Self-pay | Admitting: Diagnostic Neuroimaging

## 2022-01-06 ENCOUNTER — Ambulatory Visit: Payer: Medicaid Other | Admitting: Diagnostic Neuroimaging

## 2022-01-06 VITALS — BP 154/71 | HR 63 | Ht 64.0 in | Wt 94.6 lb

## 2022-01-06 DIAGNOSIS — G9389 Other specified disorders of brain: Secondary | ICD-10-CM

## 2022-01-06 DIAGNOSIS — R413 Other amnesia: Secondary | ICD-10-CM | POA: Diagnosis not present

## 2022-01-06 NOTE — Patient Instructions (Signed)
MEMORY LOSS (could be related to underlying pain, stress, depression, insomnia issues; underlying neurodegenerative disorder also possible. NPH is consideration, but not likely a good candidate for shunt due to comorbid medical issues; also patient not that interested in shunt surgery at this time) - check MMSE today; continue treatments for depression and pain; then may consider therapeutic LP and shunt referral in future

## 2022-01-06 NOTE — Progress Notes (Signed)
[facility]  PATIENT: [name] DOB: [id number]  REFERRING CLINICIAN: Ladell Pier, MD HISTORY FROM: patient  REASON FOR VISIT: new consult    HISTORICAL  CHIEF COMPLAINT:  Chief Complaint  Patient presents with   Cerebral ventriculomegaly    Rm 6 New Pt  [name]    HISTORY OF PRESENT ILLNESS:   61 year old female here for evaluation of memory loss and ventriculomegaly.  October 2021 patient had COVID infection.  Her daughter who had developmental delay and seizure disorder also got COVID and unfortunately passed away.  Patient had significant grieving and depression following this.  She also developed significant short-term memory loss, malaise, decreased appetite, weight loss around the time.    Patient also has significant diabetes, chronic kidney disease, adrenal insufficiency, hypertension issues.  Patient was hospitalized in May 2022 for constipation, fecal impaction, constipation, confusion issues.  She was found to have cerebral ventriculomegaly on MRI scan and referred to outpatient follow-up.  She has had recurrent hospitalization for congestive heart failure, acute kidney injury, shortness of breath, hyperglycemia issues.  She had several emergency room visits since that time.  Patient continues to have some short-term memory loss issues.  She lives with her son.  Now on antidepressant and some symptoms slightly improving.   REVIEW OF SYSTEMS: Full 14 system review of systems performed and negative with exception of: as per HPI.  ALLERGIES: Allergies  Allergen Reactions   Beef-Derived Products     RELIGOUS  REASONS   Pork-Derived Products     RELIGIOUS REASONS   Percocet [Oxycodone-Acetaminophen] Rash    HOME MEDICATIONS: Outpatient Medications Prior to Visit  Medication Sig Dispense Refill   Accu-Chek Softclix Lancets lancets Use as instructed 100 each 12   acetaminophen (TYLENOL) 325 MG tablet Take 2 tablets (650 mg  total) by mouth every 6 (six) hours as needed for up to 30 doses for moderate pain or mild pain. 30 tablet 0   amLODipine (NORVASC) 10 MG tablet TAKE 1 TABLET (10 MG TOTAL) BY MOUTH DAILY. 30 tablet 11   aspirin 81 MG chewable tablet Chew 81 mg by mouth daily.     atorvastatin (LIPITOR) 20 MG tablet TAKE 1 TABLET (20 MG TOTAL) BY MOUTH DAILY. 30 tablet 11   Blood Glucose Monitoring Suppl (ACCU-CHEK GUIDE) w/Device KIT UAD 1 kit 0   Calcium Carbonate-Vit D-Min (CALCIUM 1200 PO) Take 1,200 mg by mouth daily.     glucose blood (ACCU-CHEK GUIDE) test strip Use as instructed 100 each 12   hydrALAZINE (APRESOLINE) 10 MG tablet Take 1 tablet (10 mg total) by mouth 3 (three) times daily. 90 tablet 6   isosorbide mononitrate (IMDUR) 60 MG 24 hr tablet TAKE 1 TABLET BY MOUTH DAILY 90 tablet 6   levothyroxine (SYNTHROID) 150 MCG tablet TAKE 1 TABLET BY MOUTH DAILY BEFORE BREAKFAST. 30 tablet 6   Multiple Vitamin (MULTIVITAMIN WITH MINERALS) TABS tablet Take 1 tablet by mouth daily.     omeprazole (PRILOSEC) 40 MG capsule TAKE 1 CAPSULE (40 MG TOTAL) BY MOUTH DAILY. 30 capsule 0   ondansetron (ZOFRAN) 4 MG tablet Take 1 tablet (4 mg total) by mouth daily as needed for nausea or vomiting. 30 tablet 3   polyethylene glycol (MIRALAX / GLYCOLAX) 17 g packet Take 17 g by mouth daily. 30 each 0   sertraline (ZOLOFT) 50 MG tablet TAKE 1/2 TABLET BY MOUTH DAILY 45 tablet 0   torsemide (DEMADEX) 20 MG tablet Take 1 tablet (20 mg total)  by mouth daily as needed (for weight gain of 3lbs in 1 day or 5 lbs in 2 days.). 60 tablet 1   traMADol (ULTRAM) 50 MG tablet Take 1 tablet (50 mg total) by mouth 2 (two) times daily as needed. 60 tablet 1   vitamin B-12 (CYANOCOBALAMIN) 1000 MCG tablet Take 1 tablet (1,000 mcg total) by mouth daily. 30 tablet 0   Continuous Blood Gluc Sensor (FREESTYLE LIBRE SENSOR SYSTEM) MISC Change sensor Q 2 wks (Patient not taking: Reported on 01/06/2022) 2 each 12   Continuous Blood Gluc Sensor  (FREESTYLE LIBRE SENSOR SYSTEM) MISC Change sensor Q 2 wks (Patient not taking: Reported on 01/06/2022) 2 each 12   hydrocortisone (CORTEF) 5 MG tablet Take 2 tablets (10 mg total) by mouth in the morning. (Patient not taking: Reported on 01/06/2022) 180 tablet 0   No facility-administered medications prior to visit.    PAST MEDICAL HISTORY: Past Medical History:  Diagnosis Date   Adrenal insufficiency, primary ([place])    Anemia 2018   Hgb 6.8 early 11/2020.  PRBC x 1.  06/2017 PRBC x 1.     Cerebral ventriculomegaly    CHF (congestive heart failure) ([place]) 91/6384   Diastolic   CKD (chronic kidney disease) stage 4, GFR 15-29 ml/min ([place]) 04/2021   Stage 4 as of late May 2022   Diabetes mellitus without complication ([place])    Hypertension    Hypothyroidism     PAST SURGICAL HISTORY: Past Surgical History:  Procedure Laterality Date   CESAREAN SECTION     IR THORACENTESIS ASP PLEURAL SPACE W/IMG GUIDE  06/05/2021    FAMILY HISTORY: Family History  Problem Relation Age of Onset   Diabetes Mother    Hyperlipidemia Father    Hypertension Father    Adrenal disorder Neg Hx     SOCIAL HISTORY: Social History   Socioeconomic History   Marital status: Single    Spouse name: Not on file   Number of children: 5   Years of education: Not on file   Highest education level: High school graduate  Occupational History   Not on file  Tobacco Use   Smoking status: Never   Smokeless tobacco: Never  Vaping Use   Vaping Use: Never used  Substance and Sexual Activity   Alcohol use: No   Drug use: No   Sexual activity: Not on file  Other Topics Concern   Not on file  Social History Narrative   01/06/22 lives with son and his family   Social Determinants of Health   Financial Resource Strain: Not on file  Food Insecurity: Not on file  Transportation Needs: Not on file  Physical Activity: Not on file  Stress: Not on file  Social Connections: Not on file  Intimate Partner  Violence: Not on file     PHYSICAL EXAM  GENERAL EXAM/CONSTITUTIONAL: Vitals:  Vitals:   01/06/22 0903  BP: (!) 154/71  Pulse: 63  Weight: 94 lb 9.6 oz (42.9 kg)  Height: _0  (1.626 m)   Body mass index is 16.24 kg/m. Wt Readings from Last 3 Encounters:  01/06/22 94 lb 9.6 oz (42.9 kg)  12/10/21 95 lb (43.1 kg)  11/30/21 92 lb 6.4 oz (41.9 kg)   Patient is in no distress; well developed, nourished and groomed; neck is supple  CARDIOVASCULAR: Examination of carotid arteries is normal; no carotid bruits Regular rate and rhythm, no murmurs Examination of peripheral vascular system by observation and palpation is normal  EYES:  Ophthalmoscopic exam of optic discs and posterior segments is normal; no papilledema or hemorrhages No results found.  MUSCULOSKELETAL: Gait, strength, tone, movements noted in Neurologic exam below  NEUROLOGIC: MENTAL STATUS:  No flowsheet data found. awake, alert, oriented to person, place and time recent and remote memory intact normal attention and concentration language fluent, comprehension intact, naming intact fund of knowledge appropriate  CRANIAL NERVE:  2nd - no papilledema on fundoscopic exam 2nd, 3rd, 4th, 6th - pupils equal and reactive to light, visual fields full to confrontation, extraocular muscles intact, no nystagmus 5th - facial sensation symmetric 7th - facial strength symmetric 8th - hearing intact 9th - palate elevates symmetrically, uvula midline 11th - shoulder shrug symmetric 12th - tongue protrusion midline  MOTOR:  normal bulk and tone, full strength in the BUE, BLE  SENSORY:  normal and symmetric to light touch; decreased in lower extremity  COORDINATION:  finger-nose-finger, fine finger movements normal  REFLEXES:  deep tendon reflexes trace and symmetric  GAIT/STATION:  narrow based gait    DIAGNOSTIC DATA (LABS, IMAGING, TESTING) - I reviewed patient records, labs, notes, testing and imaging  myself where available.  Lab Results  Component Value Date   WBC 8.8 12/10/2021   HGB 9.1 (L) 12/10/2021   HCT 28.1 (L) 12/10/2021   MCV 85.2 12/10/2021   PLT 131 (L) 12/10/2021      Component Value Date/Time   NA 132 (L) 12/10/2021 2241   NA 139 10/29/2020 1527   K 4.0 12/10/2021 2241   CL 100 12/10/2021 2241   CO2 26 12/10/2021 2241   GLUCOSE 75 12/10/2021 2241   BUN 38 (H) 12/10/2021 2241   BUN 30 (H) 10/29/2020 1527   CREATININE 2.65 (H) 12/10/2021 2241   CALCIUM 8.8 (L) 12/10/2021 2241   PROT 6.3 (L) 06/04/2021 1044   PROT 7.0 10/29/2020 1527   ALBUMIN 3.0 (L) 06/04/2021 1044   ALBUMIN 4.5 10/29/2020 1527   AST 51 (H) 06/04/2021 1044   ALT 17 06/04/2021 1044   ALKPHOS 88 06/04/2021 1044   BILITOT 1.4 (H) 06/04/2021 1044   BILITOT 0.2 10/29/2020 1527   GFRNONAA 20 (L) 12/10/2021 2241   GFRAA 27 (L) 10/29/2020 1527   Lab Results  Component Value Date   CHOL 218 (H) 06/13/2011   HDL 65 06/13/2011   LDLCALC 136 (H) 06/13/2011   TRIG 85 06/13/2011   CHOLHDL 3.4 06/13/2011   Lab Results  Component Value Date   HGBA1C 5.3 10/16/2021   Lab Results  Component Value Date   VITAMINB12 269 05/13/2021   Lab Results  Component Value Date   TSH 1.738 06/02/2021    05/13/21 MRI brain [I reviewed images myself and agree with interpretation. Stable from 2021; progressed since 2006. Evans ratio 0.38. Callosal angle 104 degrees. -VRP]  1. Ventriculomegaly, which is out of proportion to the degree of sulcal volume loss. This finding is similar to priors dating back to 2021 and progressed from the more remote 2006 prior. Consider communicating hydrocephalus (including normal-pressure hydrocephalus). 2. Otherwise, no acute intracranial abnormality.  No acute infarct. 3. Mild pansinus paranasal sinus mucosal thickening and small bilateral mastoid effusions. 4. Partially empty sella. This finding is nonspecific and may be incidental, but can be seen with idiopathic  intracranial hypertension in correct clinical setting.    ASSESSMENT AND PLAN  61 y.o. year old female here with chronic memory loss issues may be related to underlying medical issues, insomnia, depression and stress and pain issues.  MMSE  18 out of 30.  Underlying neurodegenerative disorder is also possible.  Normal pressure hydrocephalus is a consideration but patient does not want a pursue surgical treatment at this time with a shunt, therefore further testing with lumbar puncture may not be helpful at this time.   Dx:  1. Cerebral ventriculomegaly   2. Memory loss      PLAN:  MEMORY LOSS (could be related to underlying pain, stress, depression, insomnia issues; underlying neurodegenerative disorder also possible. NPH is consideration, but not likely a good candidate for shunt due to comorbid medical issues; also patient not that interested in shunt surgery at this time) - continue treatments for depression and pain; may consider therapeutic LP and shunt referral in future if symptoms significant worsen or patient changes her mind for surgical evaluation - safety / supervision issues reviewed - daily physical activity / exercise (at least 15-30 minutes) - eat more plants / vegetables - increase social activities, brain stimulation, games, puzzles, hobbies, crafts, arts, music - aim for at least 7-8 hours sleep per night (or more) - avoid smoking and alcohol - caution with medications, finances; no driving  Return for pending if symptoms worsen or fail to improve.  I spent 65 minutes of face-to-face and non-face-to-face time with patient.  This included previsit chart review, lab review, study review, order entry, electronic health record documentation, patient education.     Penni Bombard, MD 2/29/7989, 2:11 AM Certified in Neurology, Neurophysiology and Neuroimaging  [facility] [address], [place], [place] [address] [phone]

## 2022-01-11 ENCOUNTER — Telehealth: Payer: Self-pay | Admitting: Diagnostic Neuroimaging

## 2022-01-11 DIAGNOSIS — N2581 Secondary hyperparathyroidism of renal origin: Secondary | ICD-10-CM | POA: Diagnosis not present

## 2022-01-11 DIAGNOSIS — N184 Chronic kidney disease, stage 4 (severe): Secondary | ICD-10-CM | POA: Diagnosis not present

## 2022-01-11 DIAGNOSIS — I701 Atherosclerosis of renal artery: Secondary | ICD-10-CM | POA: Diagnosis not present

## 2022-01-11 DIAGNOSIS — D649 Anemia, unspecified: Secondary | ICD-10-CM | POA: Diagnosis not present

## 2022-01-11 DIAGNOSIS — I129 Hypertensive chronic kidney disease with stage 1 through stage 4 chronic kidney disease, or unspecified chronic kidney disease: Secondary | ICD-10-CM | POA: Diagnosis not present

## 2022-01-11 DIAGNOSIS — E1122 Type 2 diabetes mellitus with diabetic chronic kidney disease: Secondary | ICD-10-CM | POA: Diagnosis not present

## 2022-01-11 NOTE — Telephone Encounter (Signed)
At 10:39 this morning, pt's daughter left a vm stating pt is now willing to have a [place] done, pt's daughter would like to be called to go about having this scheduled

## 2022-01-11 NOTE — Telephone Encounter (Signed)
Mychart message has been sent regarding Dr. Annell Greening recommendation.

## 2022-01-18 ENCOUNTER — Encounter: Payer: Self-pay | Admitting: Internal Medicine

## 2022-01-18 ENCOUNTER — Encounter: Payer: Self-pay | Admitting: Endocrinology

## 2022-01-18 ENCOUNTER — Other Ambulatory Visit: Payer: Self-pay

## 2022-01-18 ENCOUNTER — Ambulatory Visit: Payer: Medicaid Other | Attending: Internal Medicine | Admitting: Internal Medicine

## 2022-01-18 VITALS — BP 160/45 | HR 64 | Resp 16 | Wt 94.6 lb

## 2022-01-18 DIAGNOSIS — N184 Chronic kidney disease, stage 4 (severe): Secondary | ICD-10-CM

## 2022-01-18 DIAGNOSIS — E46 Unspecified protein-calorie malnutrition: Secondary | ICD-10-CM | POA: Diagnosis not present

## 2022-01-18 DIAGNOSIS — D638 Anemia in other chronic diseases classified elsewhere: Secondary | ICD-10-CM

## 2022-01-18 DIAGNOSIS — M1731 Unilateral post-traumatic osteoarthritis, right knee: Secondary | ICD-10-CM | POA: Diagnosis not present

## 2022-01-18 DIAGNOSIS — I129 Hypertensive chronic kidney disease with stage 1 through stage 4 chronic kidney disease, or unspecified chronic kidney disease: Secondary | ICD-10-CM

## 2022-01-18 DIAGNOSIS — D509 Iron deficiency anemia, unspecified: Secondary | ICD-10-CM | POA: Diagnosis not present

## 2022-01-18 DIAGNOSIS — Z23 Encounter for immunization: Secondary | ICD-10-CM

## 2022-01-18 DIAGNOSIS — R413 Other amnesia: Secondary | ICD-10-CM

## 2022-01-18 DIAGNOSIS — Z1231 Encounter for screening mammogram for malignant neoplasm of breast: Secondary | ICD-10-CM

## 2022-01-18 DIAGNOSIS — Z1211 Encounter for screening for malignant neoplasm of colon: Secondary | ICD-10-CM

## 2022-01-18 DIAGNOSIS — E11649 Type 2 diabetes mellitus with hypoglycemia without coma: Secondary | ICD-10-CM | POA: Diagnosis not present

## 2022-01-18 NOTE — Patient Instructions (Signed)
Please call Dr. Loanne Drilling and let him know that your morning blood sugar readings are low.  Inquire whether he wants you to restart the steroid medication.  Please call and schedule your mammogram.  I will have our referral coordinator check into the gastroenterology referral for you to have your colonoscopy for colon cancer screening.

## 2022-01-18 NOTE — Progress Notes (Signed)
Patient ID: [name], female    DOB: 10-Mar-1961  MRN: [id number]  CC: Hip Pain (Right hip)   Subjective: [name] is a 61 y.o. female who presents for chronic disease management.  Daughter, [name], is with her Her concerns today include:  [name] is a 61 year old female with a history of chronic adrenal insufficiency, DM type 2 (followed by Dr. Loanne Drilling), HTN, stage 4 CKD, diastolic CHF (EF of 65 to 70%, followed by Dr. Harrell Gave), hypothyroid, GERD  Patient last evaluated by me Sep 29, 2021.  DM: Patient reports that her blood sugars were running low in the mornings and high in the evenings.  She saw Dr. Loanne Drilling recently and repaglinide was discontinued.  He also decreased the dose of hydrocortisone from 10 mg in the morning and 5 mg in the evening to just 10 mg once a day.  Told to add more carbs to diet.  Now BS in a.m 69-75. Did not check this a.m.  Yesterday a.m was 70.  At nights 135-140.   Had a boil over LT ear x 6 wks. Slept on it and went down about 2 wks ago.  No drainage  HTN/dCHF: Did not take meds as yet for the a.m.  Reports compliance with hydralazine, amlodipine, isosorbide and furosemide as needed. Checks BP daily and gives range of 130-140/55s No LE edema. Will see Dr. Arlan Organ 02/2022  CKD 4:  saw Dr. Johnney Ou 01/11/2022.  Wgh was 92.6 lbs  and BP 130/54. Patient reports being told to stop iron.  She gets serious constipation with oral iron supplement.  Daughter tells me that the nephrologist was going to arrange for her to get either the iron infusion or start ESA injection.  She was not sure of which 1.  She plans to call the nephrologist sometime this week to follow-up on that.   No plan for procedure intervention for RAS because BP stable per daughter F/u in 4 mths  She saw the neurologist Dr. Leta Baptist on the 18th of this month for follow-up on memory loss and cerebral ventriculomegaly seen on MRI.  He thinks her memory loss could be multifactorial  including depression and chronic pain.  Patient is on low-dose Zoloft which was started by me in November after her daughter reached out to me via [facility].  She informed me that there was a decline in her mother's overall activity since the death of their sister in Sep 29, 2020.  She had lost interest in doing things and just wanted to sleep all the time.  She is taking the Zoloft as prescribed. The neurologist stated that he may consider therapeutic LP and shunt referral in future if symptoms significantly worsen or patient changes her mind for surgical evaluation.   -Patient states she still struggles with memory issues.  She lives with her son and daughter-in-law.  They do set up her medications and remind her to take them.    OA RT knee:  doing okay with tramadol.  She denies any significant drowsiness with the medication.  Underwgh: Noted to be significantly underweight with a BMI of 16.  Looks like her highest weight last year was 112 pounds.  She tells me that she eats 3 meals a day and that her appetite is good.  However her daughter states that she eats like a bird.  She denies any fever or night sweats.  No blood in his stools.  No chronic cough.  HM:  Did not have MMG that was ordered on last  visit.  Daughter does not remember them being called.  No call as yet from GI colonoscopy.  Needs PAP/tdapt/shingles.  She is agreeable to receiving the Tdap today. 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   Hypokalemia 05/12/2021    Fecal impaction ([place]) 05/12/2021   Anemia due to chronic kidney disease 05/12/2021   Adrenal insufficiency (Addison's disease) ([place]) 05/12/2021   CKD (chronic kidney disease) stage 4, GFR 15-29 ml/min ([place]) 11/21/2020   HTN (hypertension) 11/21/2020   New onset of congestive heart failure ([place]) 11/21/2020   Dehydration 12/09/2017   Abdominal pain, bilateral lower quadrant 12/09/2017   Hypoglycemia 07/08/2017   Hypoglycemia associated with diabetes ([place]) 05/10/2017   Malnutrition of moderate degree 05/10/2017     Current Outpatient Medications on File Prior to Visit  Medication Sig Dispense Refill   Accu-Chek Softclix Lancets lancets Use as instructed 100 each 12   acetaminophen (TYLENOL) 325 MG tablet Take 2 tablets (650 mg total) by mouth every 6 (six) hours as needed for up to 30 doses for moderate pain or mild pain. 30 tablet 0   amLODipine (NORVASC) 10 MG tablet TAKE 1 TABLET (10 MG TOTAL) BY MOUTH DAILY. 30 tablet 11   aspirin 81 MG chewable tablet Chew 81 mg by mouth daily.     atorvastatin (LIPITOR) 20 MG tablet TAKE 1 TABLET (20 MG TOTAL) BY MOUTH DAILY. 30 tablet 11   Blood Glucose Monitoring Suppl (ACCU-CHEK GUIDE) w/Device KIT UAD 1 kit 0   Calcium Carbonate-Vit D-Min (CALCIUM 1200 PO) Take 1,200 mg by mouth daily.     Continuous Blood Gluc Sensor (FREESTYLE LIBRE SENSOR SYSTEM) MISC Change sensor Q 2 wks (Patient not taking: Reported on 01/06/2022) 2 each 12   Continuous Blood Gluc Sensor (FREESTYLE LIBRE SENSOR SYSTEM) MISC Change sensor Q 2 wks (Patient not taking: Reported on 01/06/2022) 2 each 12   glucose blood (ACCU-CHEK GUIDE) test strip Use as instructed 100 each 12   hydrALAZINE (APRESOLINE) 10 MG tablet Take 1 tablet (10 mg total) by mouth 3 (three) times daily. 90 tablet 6   hydrocortisone (CORTEF) 5 MG tablet Take 2 tablets (10 mg total) by mouth in the morning. (Patient not taking: Reported on 01/06/2022) 180 tablet 0   isosorbide mononitrate (IMDUR) 60 MG 24 hr  tablet TAKE 1 TABLET BY MOUTH DAILY 90 tablet 6   levothyroxine (SYNTHROID) 150 MCG tablet TAKE 1 TABLET BY MOUTH DAILY BEFORE BREAKFAST. 30 tablet 6   Multiple Vitamin (MULTIVITAMIN WITH MINERALS) TABS tablet Take 1 tablet by mouth daily.     omeprazole (PRILOSEC) 40 MG capsule TAKE 1 CAPSULE (40 MG TOTAL) BY MOUTH DAILY. 30 capsule 0   ondansetron (ZOFRAN) 4 MG tablet Take 1 tablet (4 mg total) by mouth daily as needed for nausea or vomiting. 30 tablet 3   polyethylene glycol (MIRALAX / GLYCOLAX) 17 g packet Take 17 g by mouth daily. 30 each 0   sertraline (ZOLOFT) 50 MG tablet TAKE 1/2 TABLET BY MOUTH DAILY 45 tablet 0   torsemide (DEMADEX) 20 MG tablet Take 1  tablet (20 mg total) by mouth daily as needed (for weight gain of 3lbs in 1 day or 5 lbs in 2 days.). 60 tablet 1   traMADol (ULTRAM) 50 MG tablet Take 1 tablet (50 mg total) by mouth 2 (two) times daily as needed. 60 tablet 1   vitamin B-12 (CYANOCOBALAMIN) 1000 MCG tablet Take 1 tablet (1,000 mcg total) by mouth daily. 30 tablet 0   No current facility-administered medications on file prior to visit.    Allergies  Allergen Reactions   Beef-Derived Products     RELIGOUS  REASONS   Pork-Derived Products     RELIGIOUS REASONS   Percocet [Oxycodone-Acetaminophen] Rash    Social History   Socioeconomic History   Marital status: Single    Spouse name: Not on file   Number of children: 5   Years of education: Not on file   Highest education level: High school graduate  Occupational History   Not on file  Tobacco Use   Smoking status: Never   Smokeless tobacco: Never  Vaping Use   Vaping Use: Never used  Substance and Sexual Activity   Alcohol use: No   Drug use: No   Sexual activity: Not on file  Other Topics Concern   Not on file  Social History Narrative   01/06/22 lives with son and his family   Social Determinants of Radio broadcast assistant Strain: Not on file  Food Insecurity: Not on file  Transportation  Needs: Not on file  Physical Activity: Not on file  Stress: Not on file  Social Connections: Not on file  Intimate Partner Violence: Not on file    Family History  Problem Relation Age of Onset   Diabetes Mother    Hyperlipidemia Father    Hypertension Father    Adrenal disorder Neg Hx     Past Surgical History:  Procedure Laterality Date   CESAREAN SECTION     IR THORACENTESIS ASP PLEURAL SPACE W/IMG GUIDE  06/05/2021    ROS: Review of Systems Negative except as stated above  PHYSICAL EXAM: BP (!) 160/45    Pulse 64    Resp 16    Wt 94 lb 9.6 oz (42.9 kg)    SpO2 100%    BMI 16.24 kg/m   Wt Readings from Last 3 Encounters:  01/18/22 94 lb 9.6 oz (42.9 kg)  01/06/22 94 lb 9.6 oz (42.9 kg)  12/10/21 95 lb (43.1 kg)    Physical Exam  General appearance -older female who appears malnourished with mild temporal wasting and supraclavicular wasting Mental status -patient oriented to person and place.  She is a little bit forgetful and relies on her daughter to help with history.   Mouth - mucous membranes moist, pharynx normal without lesions Neck - supple, no significant adenopathy Chest - clear to auscultation, no wheezes, rales or rhonchi, symmetric air entry Heart - normal rate, regular rhythm, normal S1, S2, no murmurs, rubs, clicks or gallops Extremities -no lower extremity edema.  Depression screen [facility] 2/9 10/16/2021 07/14/2021 06/25/2021  Decreased Interest 0 0 0  Down, Depressed, Hopeless 0 0 0  PHQ - 2 Score 0 0 0  Altered sleeping 0 - -  Tired, decreased energy 1 - -  Change in appetite 1 - -  Feeling bad or failure about yourself  0 - -  Trouble concentrating 0 - -  Moving slowly or fidgety/restless 0 - -  Suicidal thoughts 0 - -  PHQ-9 Score 2 - -  CMP Latest Ref Rng & Units 12/10/2021 06/11/2021 06/10/2021  Glucose 70 - 99 mg/dL 75 226(H) 228(H)  BUN 8 - 23 mg/dL 38(H) 32(H) 32(H)  Creatinine 0.44 - 1.00 mg/dL 2.65(H) 3.07(H) 3.32(H)  Sodium 135 -  145 mmol/L 132(L) 129(L) 129(L)  Potassium 3.5 - 5.1 mmol/L 4.0 3.9 4.5  Chloride 98 - 111 mmol/L 100 92(L) 89(L)  CO2 22 - 32 mmol/L $RemoveB'26 28 31  'SfhvQUAw$ Calcium 8.9 - 10.3 mg/dL 8.8(L) 8.8(L) 8.7(L)  Total Protein 6.5 - 8.1 g/dL - - -  Total Bilirubin 0.3 - 1.2 mg/dL - - -  Alkaline Phos 38 - 126 U/L - - -  AST 15 - 41 U/L - - -  ALT 0 - 44 U/L - - -   Lipid Panel     Component Value Date/Time   CHOL 218 (H) 06/13/2011 0810   TRIG 85 06/13/2011 0810   HDL 65 06/13/2011 0810   CHOLHDL 3.4 06/13/2011 0810   VLDL 17 06/13/2011 0810   LDLCALC 136 (H) 06/13/2011 0810    CBC    Component Value Date/Time   WBC 8.8 12/10/2021 2241   RBC 3.30 (L) 12/10/2021 2241   HGB 9.1 (L) 12/10/2021 2241   HCT 28.1 (L) 12/10/2021 2241   PLT 131 (L) 12/10/2021 2241   MCV 85.2 12/10/2021 2241   MCH 27.6 12/10/2021 2241   MCHC 32.4 12/10/2021 2241   RDW 14.7 12/10/2021 2241   LYMPHSABS 1.2 06/04/2021 0021   MONOABS 0.5 06/04/2021 0021   EOSABS 0.1 06/04/2021 0021   BASOSABS 0.0 06/04/2021 0021   Lab Results  Component Value Date   HGBA1C 5.3 10/16/2021    ASSESSMENT AND PLAN: 1. Type 2 diabetes mellitus with hypoglycemia without coma, without long-term current use of insulin (HCC) Recommended patient touch base with Dr. Loanne Drilling to see what he would recommend about her morning hypoglycemic episodes.  Patient should be taking hydrocortisone 10 mg daily. - Comprehensive metabolic panel - Lipid panel  2. Malnutrition, unspecified type ([place]) Weight loss even with being on steroid if she is taking it.  May be related to overall chronic illnesses including advanced CKD.  Also may be related to depression after losing her daughter in October 2021.  She will continue the Zoloft.  I recommend eating smaller but more frequent meals.  Can use boost or Ensure to supplement meals.  Referral to nutritionist Stressed importance of getting her up-to-date with age-appropriate cancer screenings. - Comprehensive  metabolic panel - [id number] - Amb ref to Medical Nutrition Therapy-MNT  3. Memory changes May be related to depression versus just overall multiple chronic illnesses.  Followed by neurology.  4. Hypertensive kidney disease with chronic kidney disease stage IV (HCC) Blood pressure not at goal today but she has not taken medicines as yet for the morning.  No changes made. - Comprehensive metabolic panel  5. Post-traumatic osteoarthritis of right knee She will continue tramadol as needed.  She reports no significant side effects from the medication.  6. Anemia, chronic disease Daughter reports that she will be receiving either IV iron infusion or ESA injection. - CBC  7. Need for Tdap vaccination Given today.  8. Screening for colon cancer Referred on last visit for colonoscopy to [facility] gastroenterology.  No appointment as yet.  Message sent to our referral coordinator.  9. Encounter for screening mammogram for malignant neoplasm of breast Daughter given phone number to call [facility] imaging to schedule mammogram.      Patient was given  the opportunity to ask questions.  Patient verbalized understanding of the plan and was able to repeat key elements of the plan.   Orders Placed This Encounter  Procedures   Tdap vaccine greater than or equal to 7yo IM   CBC   Comprehensive metabolic panel   Lipid panel   TSH+T4F+T3Free   Amb ref to Medical Nutrition Therapy-MNT     Requested Prescriptions    No prescriptions requested or ordered in this encounter    Return in about 6 weeks (around 03/01/2022) for PAP.  Karle Plumber, MD, FACP

## 2022-01-19 LAB — COMPREHENSIVE METABOLIC PANEL
ALT: 130 IU/L — ABNORMAL HIGH (ref 0–32)
AST: 233 IU/L — ABNORMAL HIGH (ref 0–40)
Albumin/Globulin Ratio: 1.2 (ref 1.2–2.2)
Albumin: 4.2 g/dL (ref 3.8–4.8)
Alkaline Phosphatase: 241 IU/L — ABNORMAL HIGH (ref 44–121)
BUN/Creatinine Ratio: 14 (ref 12–28)
BUN: 23 mg/dL (ref 8–27)
Bilirubin Total: 0.3 mg/dL (ref 0.0–1.2)
CO2: 21 mmol/L (ref 20–29)
Calcium: 8.9 mg/dL (ref 8.7–10.3)
Chloride: 107 mmol/L — ABNORMAL HIGH (ref 96–106)
Creatinine, Ser: 1.6 mg/dL — ABNORMAL HIGH (ref 0.57–1.00)
Globulin, Total: 3.4 g/dL (ref 1.5–4.5)
Glucose: 84 mg/dL (ref 70–99)
Potassium: 4.3 mmol/L (ref 3.5–5.2)
Sodium: 139 mmol/L (ref 134–144)
Total Protein: 7.6 g/dL (ref 6.0–8.5)
eGFR: 36 mL/min/{1.73_m2} — ABNORMAL LOW (ref >59)

## 2022-01-19 LAB — CBC
Hematocrit: 31 % — ABNORMAL LOW (ref 34.0–46.6)
Hemoglobin: 10 g/dL — ABNORMAL LOW (ref 11.1–15.9)
MCH: 27.6 pg (ref 26.6–33.0)
MCHC: 32.3 g/dL (ref 31.5–35.7)
MCV: 86 fL (ref 79–97)
Platelets: 137 10*3/uL — ABNORMAL LOW (ref 150–450)
RBC: 3.62 x10E6/uL — ABNORMAL LOW (ref 3.77–5.28)
RDW: 15.9 % — ABNORMAL HIGH (ref 11.7–15.4)
WBC: 5.5 10*3/uL (ref 3.4–10.8)

## 2022-01-19 LAB — LIPID PANEL
Chol/HDL Ratio: 3.1 ratio (ref 0.0–4.4)
Cholesterol, Total: 100 mg/dL (ref 100–199)
HDL: 32 mg/dL — ABNORMAL LOW (ref >39)
LDL Chol Calc (NIH): 50 mg/dL (ref 0–99)
Triglycerides: 94 mg/dL (ref 0–149)
VLDL Cholesterol Cal: 18 mg/dL (ref 5–40)

## 2022-01-19 LAB — TSH+T4F+T3FREE
Free T4: 0.65 ng/dL — ABNORMAL LOW (ref 0.82–1.77)
T3, Free: 1.9 pg/mL — ABNORMAL LOW (ref 2.0–4.4)
TSH: 4.81 u[IU]/mL — ABNORMAL HIGH (ref 0.450–4.500)

## 2022-01-20 ENCOUNTER — Telehealth: Payer: Self-pay | Admitting: Internal Medicine

## 2022-01-20 DIAGNOSIS — R7989 Other specified abnormal findings of blood chemistry: Secondary | ICD-10-CM

## 2022-01-20 DIAGNOSIS — Z419 Encounter for procedure for purposes other than remedying health state, unspecified: Secondary | ICD-10-CM | POA: Diagnosis not present

## 2022-01-20 MED ORDER — LEVOTHYROXINE SODIUM 25 MCG PO TABS: ORAL_TABLET | ORAL | 3 refills | Status: DC

## 2022-01-20 NOTE — Telephone Encounter (Signed)
Phone call placed to patient's daughter [name] today to go over lab results.   -patient's liver function tests is markedly abnormal.  Advised to stop atorvastatin and Tylenol and have her return to the lab in 2 weeks to have liver function test rechecked.  At that time I will also screen for viral hepatitis.  -Thyroid level is off indicating we need to increase the dose of the levothyroxine.  I confirm that she is taking 150 mcg daily.  We will increase the dose slightly by adding 25 mcg 1/2 tab daily to take with the 150 mcg daily.  Prescription to be sent to her pharmacy.  Kidney function not 100% but improved compared to when it was checked a month ago.  GFR is 38.  Creatinine 1.6.  Anemia has improved with hemoglobin now at 10.  I also informed [name] that I looked at Dr. Percell Boston note again after they left from the recent appointment with me.  He did not stop the steroid completely.  Instead he decreased the dose from 5 mg 2 tablets in the morning/1 tablet in the evening to 5 mg 2 tablets in the morning only.

## 2022-01-21 ENCOUNTER — Other Ambulatory Visit (HOSPITAL_COMMUNITY): Payer: Self-pay | Admitting: *Deleted

## 2022-01-22 ENCOUNTER — Inpatient Hospital Stay (HOSPITAL_COMMUNITY): Admission: RE | Admit: 2022-01-22 | Payer: Medicaid Other | Source: Ambulatory Visit

## 2022-01-26 ENCOUNTER — Other Ambulatory Visit: Payer: Self-pay

## 2022-01-26 NOTE — Telephone Encounter (Signed)
Patient is scheduled for appointment on 02/03/22

## 2022-01-27 ENCOUNTER — Encounter: Payer: Self-pay | Admitting: Internal Medicine

## 2022-01-27 ENCOUNTER — Encounter (HOSPITAL_COMMUNITY): Payer: Medicaid Other

## 2022-01-29 ENCOUNTER — Encounter (HOSPITAL_COMMUNITY): Payer: Medicaid Other

## 2022-02-02 ENCOUNTER — Other Ambulatory Visit (HOSPITAL_COMMUNITY): Payer: Self-pay

## 2022-02-03 ENCOUNTER — Encounter (HOSPITAL_COMMUNITY)
Admission: RE | Admit: 2022-02-03 | Discharge: 2022-02-03 | Disposition: A | Discharge status: Home / Self Care | Payer: Medicaid Other | Source: Ambulatory Visit | Attending: Internal Medicine | Admitting: Internal Medicine

## 2022-02-03 ENCOUNTER — Other Ambulatory Visit: Payer: Self-pay | Admitting: Internal Medicine

## 2022-02-03 ENCOUNTER — Ambulatory Visit (INDEPENDENT_AMBULATORY_CARE_PROVIDER_SITE_OTHER): Payer: Medicaid Other | Admitting: Endocrinology

## 2022-02-03 ENCOUNTER — Other Ambulatory Visit: Payer: Self-pay

## 2022-02-03 VITALS — BP 122/50 | HR 63 | Ht 64.0 in | Wt 92.6 lb

## 2022-02-03 DIAGNOSIS — E11649 Type 2 diabetes mellitus with hypoglycemia without coma: Secondary | ICD-10-CM

## 2022-02-03 DIAGNOSIS — E271 Primary adrenocortical insufficiency: Secondary | ICD-10-CM | POA: Diagnosis not present

## 2022-02-03 LAB — POCT GLYCOSYLATED HEMOGLOBIN (HGB A1C): Hemoglobin A1C: 5 % (ref 4.0–5.6)

## 2022-02-03 LAB — CORTISOL
Cortisol, Plasma: 0.5 ug/dL
Cortisol, Plasma: 2.6 ug/dL

## 2022-02-03 MED ORDER — HYDROCORTISONE 10 MG PO TABS: 30.0000 mg | ORAL_TABLET | Freq: Every day | ORAL | 5 refills | Status: DC

## 2022-02-03 MED ORDER — SODIUM CHLORIDE 0.9 % IV SOLN
510.0000 mg | INTRAVENOUS | Status: DC
  Administered 2022-02-03: 510 mg via INTRAVENOUS
  Filled 2022-02-03: qty 510

## 2022-02-03 MED ORDER — COSYNTROPIN 0.25 MG IJ SOLR
0.2500 mg | Freq: Once | INTRAMUSCULAR | Status: AC
  Administered 2022-02-03: 0.25 mg via INTRAVENOUS

## 2022-02-03 NOTE — Patient Instructions (Addendum)
No medication is needed for the blood sugar. Blood tests are requested for you today.  We'll let you know about the results.  check your blood sugar once a day.  vary the time of day when you check, between before the 3 meals, and at bedtime.  also check if you have symptoms of your blood sugar being too high or too low.  please keep a record of the readings and bring it to your next appointment here (or you can bring the meter itself).  You can write it on any piece of paper.  please call us sooner if your blood sugar goes below 70, or if most of your readings are over 200.

## 2022-02-03 NOTE — Progress Notes (Signed)
Subjective:    Patient ID: [name], female    DOB: 10-Feb-1961, 61 y.o.   MRN: [id number]  HPI Pt returns for f/u of low cortisol (dx'ed 2012; she took cortef until normal ACTH and cortisol off rx in 2022; she has labile HTN).  She was restarted on cortef mid-2022; she has not recently taken [facility].    She also has hypothyroidism Pt also has DM: DM type: 2 Dx'ed: 7989 Complications: none Therapy: no medication now GDM: 1990 DKA: never Severe hypoglycemia: never Pancreatitis: never Pancreatic imaging: (2022 CT): mild atrophy SDOH: none Other: she has never been on insulin Interval history: She had an episode of cbg in the 50's.  911 was called.  Dtr in law says this was due to incorrect timing of taking repaglinide.  Past Medical History:  Diagnosis Date   Adrenal insufficiency, primary ([place])    Anemia 2018   Hgb 6.8 early 11/2020.  PRBC x 1.  06/2017 PRBC x 1.     Cerebral ventriculomegaly    CHF (congestive heart failure) ([place]) 21/1941   Diastolic   CKD (chronic kidney disease) stage 4, GFR 15-29 ml/min ([place]) 04/2021   Stage 4 as of late May 2022   Diabetes mellitus without complication ([place])    Hypertension    Hypothyroidism     Past Surgical History:  Procedure Laterality Date   CESAREAN SECTION     IR THORACENTESIS ASP PLEURAL SPACE W/IMG GUIDE  06/05/2021    Social History   Socioeconomic History   Marital status: Single    Spouse name: Not on file   Number of children: 5   Years of education: Not on file   Highest education level: High school graduate  Occupational History   Not on file  Tobacco Use   Smoking status: Never   Smokeless tobacco: Never  Vaping Use   Vaping Use: Never used  Substance and Sexual Activity   Alcohol use: No   Drug use: No   Sexual activity: Not on file  Other Topics Concern   Not on file  Social History Narrative   01/06/22 lives with son and his family   Social Determinants of Health   Financial Resource Strain:  Not on file  Food Insecurity: Not on file  Transportation Needs: Not on file  Physical Activity: Not on file  Stress: Not on file  Social Connections: Not on file  Intimate Partner Violence: Not on file    Current Outpatient Medications on File Prior to Visit  Medication Sig Dispense Refill   Accu-Chek Softclix Lancets lancets Use as instructed 100 each 12   amLODipine (NORVASC) 10 MG tablet TAKE 1 TABLET (10 MG TOTAL) BY MOUTH DAILY. 30 tablet 11   aspirin 81 MG chewable tablet Chew 81 mg by mouth daily.     Blood Glucose Monitoring Suppl (ACCU-CHEK GUIDE) w/Device KIT UAD 1 kit 0   Calcium Carbonate-Vit D-Min (CALCIUM 1200 PO) Take 1,200 mg by mouth daily.     Continuous Blood Gluc Sensor (FREESTYLE LIBRE SENSOR SYSTEM) MISC Change sensor Q 2 wks 2 each 12   Continuous Blood Gluc Sensor (FREESTYLE LIBRE SENSOR SYSTEM) MISC Change sensor Q 2 wks 2 each 12   glucose blood (ACCU-CHEK GUIDE) test strip Use as instructed 100 each 12   hydrALAZINE (APRESOLINE) 10 MG tablet Take 1 tablet (10 mg total) by mouth 3 (three) times daily. 90 tablet 6   isosorbide mononitrate (IMDUR) 60 MG 24 hr tablet TAKE 1 TABLET  BY MOUTH DAILY 90 tablet 6   levothyroxine (SYNTHROID) 150 MCG tablet TAKE 1 TABLET BY MOUTH DAILY BEFORE BREAKFAST. 30 tablet 6   levothyroxine (SYNTHROID) 25 MCG tablet 1/2 tab PO daily with the 150 mcg tab 30 tablet 3   Multiple Vitamin (MULTIVITAMIN WITH MINERALS) TABS tablet Take 1 tablet by mouth daily.     omeprazole (PRILOSEC) 40 MG capsule TAKE 1 CAPSULE (40 MG TOTAL) BY MOUTH DAILY. 30 capsule 0   ondansetron (ZOFRAN) 4 MG tablet Take 1 tablet (4 mg total) by mouth daily as needed for nausea or vomiting. 30 tablet 3   polyethylene glycol (MIRALAX / GLYCOLAX) 17 g packet Take 17 g by mouth daily. 30 each 0   sertraline (ZOLOFT) 50 MG tablet TAKE 1/2 TABLET BY MOUTH DAILY 45 tablet 0   traMADol (ULTRAM) 50 MG tablet Take 1 tablet (50 mg total) by mouth 2 (two) times daily as  needed. 60 tablet 1   vitamin B-12 (CYANOCOBALAMIN) 1000 MCG tablet Take 1 tablet (1,000 mcg total) by mouth daily. 30 tablet 0   No current facility-administered medications on file prior to visit.    Allergies  Allergen Reactions   Beef-Derived Products     RELIGOUS  REASONS   Pork-Derived Products     RELIGIOUS REASONS   Percocet [Oxycodone-Acetaminophen] Rash    Family History  Problem Relation Age of Onset   Diabetes Mother    Hyperlipidemia Father    Hypertension Father    Adrenal disorder Neg Hx     BP (!) 122/50    Pulse 63    Ht $R'5\' 4"'zy$  (1.626 m)    Wt 92 lb 9.6 oz (42 kg)    SpO2 99%    BMI 15.89 kg/m    Review of Systems     Objective:   Physical Exam    Lab Results  Component Value Date   HGBA1C 5.0 02/03/2022   ACTH stimulation test is done: baseline cortisol level=1 then Cosyntropin 250 mcg is given im 45 minutes later, cortisol level=3 (normal response)     Assessment & Plan:  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  I have sent a prescription to your pharmacy, for this.   Type 2 DM: stable off rx Hypoglycemia: poss due to or exac by hypocortisolism.    Patient Instructions  No medication is needed for the blood sugar. Blood tests are requested for you today.  We'll let you know about the results.  check your blood sugar once a day.  vary the time of day when you check, between before the 3 meals, and at bedtime.  also check if you have symptoms of your blood sugar being too high or too low.  please keep a record of the readings and bring it to your next appointment here (or you can bring the meter itself).  You can write it on any piece of paper.  please call us sooner if your blood sugar goes below 70, or if most of your readings are over 200.

## 2022-02-06 LAB — ACTH: C206 ACTH: <5 pg/mL — ABNORMAL LOW (ref 6–50)

## 2022-02-10 ENCOUNTER — Encounter (HOSPITAL_COMMUNITY)
Admission: RE | Admit: 2022-02-10 | Discharge: 2022-02-10 | Disposition: A | Discharge status: Home / Self Care | Payer: Medicaid Other | Source: Ambulatory Visit | Attending: Internal Medicine | Admitting: Internal Medicine

## 2022-02-10 ENCOUNTER — Other Ambulatory Visit: Payer: Self-pay

## 2022-02-10 DIAGNOSIS — E271 Primary adrenocortical insufficiency: Secondary | ICD-10-CM | POA: Diagnosis not present

## 2022-02-10 DIAGNOSIS — E11649 Type 2 diabetes mellitus with hypoglycemia without coma: Secondary | ICD-10-CM | POA: Diagnosis not present

## 2022-02-10 MED ORDER — SODIUM CHLORIDE 0.9 % IV SOLN
510.0000 mg | INTRAVENOUS | Status: DC
  Administered 2022-02-10: 510 mg via INTRAVENOUS
  Filled 2022-02-10: qty 510

## 2022-02-14 ENCOUNTER — Other Ambulatory Visit: Payer: Self-pay

## 2022-02-14 ENCOUNTER — Encounter (HOSPITAL_COMMUNITY): Payer: Self-pay | Admitting: Emergency Medicine

## 2022-02-14 ENCOUNTER — Emergency Department (HOSPITAL_COMMUNITY): Payer: Medicaid Other

## 2022-02-14 ENCOUNTER — Emergency Department (HOSPITAL_COMMUNITY)
Admission: EM | Admit: 2022-02-14 | Discharge: 2022-02-14 | Disposition: A | Discharge status: Home / Self Care | Payer: Medicaid Other | Attending: Emergency Medicine | Admitting: Emergency Medicine

## 2022-02-14 DIAGNOSIS — R1084 Generalized abdominal pain: Secondary | ICD-10-CM | POA: Diagnosis not present

## 2022-02-14 DIAGNOSIS — Z7982 Long term (current) use of aspirin: Secondary | ICD-10-CM | POA: Diagnosis not present

## 2022-02-14 DIAGNOSIS — Z743 Need for continuous supervision: Secondary | ICD-10-CM | POA: Diagnosis not present

## 2022-02-14 DIAGNOSIS — R10817 Generalized abdominal tenderness: Secondary | ICD-10-CM | POA: Diagnosis not present

## 2022-02-14 DIAGNOSIS — I10 Essential (primary) hypertension: Secondary | ICD-10-CM | POA: Diagnosis not present

## 2022-02-14 DIAGNOSIS — R1110 Vomiting, unspecified: Secondary | ICD-10-CM | POA: Diagnosis not present

## 2022-02-14 DIAGNOSIS — E860 Dehydration: Secondary | ICD-10-CM | POA: Insufficient documentation

## 2022-02-14 DIAGNOSIS — R109 Unspecified abdominal pain: Secondary | ICD-10-CM | POA: Diagnosis not present

## 2022-02-14 LAB — COMPREHENSIVE METABOLIC PANEL
ALT: 25 U/L (ref 0–44)
AST: 31 U/L (ref 15–41)
Albumin: 3.9 g/dL (ref 3.5–5.0)
Alkaline Phosphatase: 105 U/L (ref 38–126)
Anion gap: 4 — ABNORMAL LOW (ref 5–15)
BUN: 26 mg/dL — ABNORMAL HIGH (ref 8–23)
CO2: 26 mmol/L (ref 22–32)
Calcium: 8.5 mg/dL — ABNORMAL LOW (ref 8.9–10.3)
Chloride: 103 mmol/L (ref 98–111)
Creatinine, Ser: 1.59 mg/dL — ABNORMAL HIGH (ref 0.44–1.00)
GFR, Estimated: 37 mL/min — ABNORMAL LOW (ref >60)
Glucose, Bld: 85 mg/dL (ref 70–99)
Potassium: 4 mmol/L (ref 3.5–5.1)
Sodium: 133 mmol/L — ABNORMAL LOW (ref 135–145)
Total Bilirubin: 0.4 mg/dL (ref 0.3–1.2)
Total Protein: 7.4 g/dL (ref 6.5–8.1)

## 2022-02-14 LAB — URINALYSIS, ROUTINE W REFLEX MICROSCOPIC
Bacteria, UA: NONE SEEN
Bilirubin Urine: NEGATIVE
Glucose, UA: NEGATIVE mg/dL
Ketones, ur: NEGATIVE mg/dL
Nitrite: NEGATIVE
Protein, ur: NEGATIVE mg/dL
Specific Gravity, Urine: 1.033 — ABNORMAL HIGH (ref 1.005–1.030)
pH: 5 (ref 5.0–8.0)

## 2022-02-14 LAB — CBC WITH DIFFERENTIAL/PLATELET
Abs Immature Granulocytes: 0.04 10*3/uL (ref 0.00–0.07)
Basophils Absolute: 0.1 10*3/uL (ref 0.0–0.1)
Basophils Relative: 1 %
Eosinophils Absolute: 0.2 10*3/uL (ref 0.0–0.5)
Eosinophils Relative: 2 %
HCT: 27.4 % — ABNORMAL LOW (ref 36.0–46.0)
Hemoglobin: 9 g/dL — ABNORMAL LOW (ref 12.0–15.0)
Immature Granulocytes: 0 %
Lymphocytes Relative: 29 %
Lymphs Abs: 2.7 10*3/uL (ref 0.7–4.0)
MCH: 29.1 pg (ref 26.0–34.0)
MCHC: 32.8 g/dL (ref 30.0–36.0)
MCV: 88.7 fL (ref 80.0–100.0)
Monocytes Absolute: 0.6 10*3/uL (ref 0.1–1.0)
Monocytes Relative: 7 %
Neutro Abs: 5.6 10*3/uL (ref 1.7–7.7)
Neutrophils Relative %: 61 %
Platelets: 141 10*3/uL — ABNORMAL LOW (ref 150–400)
RBC: 3.09 MIL/uL — ABNORMAL LOW (ref 3.87–5.11)
RDW: 14.8 % (ref 11.5–15.5)
WBC: 9.1 10*3/uL (ref 4.0–10.5)
nRBC: 0 % (ref 0.0–0.2)

## 2022-02-14 LAB — LIPASE, BLOOD: Lipase: 29 U/L (ref 11–51)

## 2022-02-14 LAB — CBG MONITORING, ED: Glucose-Capillary: 81 mg/dL (ref 70–99)

## 2022-02-14 MED ORDER — ONDANSETRON HCL 4 MG/2ML IJ SOLN
4.0000 mg | Freq: Once | INTRAMUSCULAR | Status: AC
  Administered 2022-02-14: 4 mg via INTRAVENOUS
  Filled 2022-02-14: qty 2

## 2022-02-14 MED ORDER — IOHEXOL 300 MG/ML  SOLN
70.0000 mL | Freq: Once | INTRAMUSCULAR | Status: AC | PRN
  Administered 2022-02-14: 70 mL via INTRAVENOUS

## 2022-02-14 MED ORDER — DICYCLOMINE HCL 10 MG/ML IM SOLN
20.0000 mg | Freq: Once | INTRAMUSCULAR | Status: AC
  Administered 2022-02-14: 20 mg via INTRAMUSCULAR
  Filled 2022-02-14: qty 2

## 2022-02-14 MED ORDER — FENTANYL CITRATE PF 50 MCG/ML IJ SOSY
25.0000 ug | PREFILLED_SYRINGE | Freq: Once | INTRAMUSCULAR | Status: AC
  Administered 2022-02-14: 25 ug via INTRAVENOUS
  Filled 2022-02-14: qty 1

## 2022-02-14 MED ORDER — SODIUM CHLORIDE 0.9 % IV SOLN: INTRAVENOUS | Status: DC

## 2022-02-14 MED ORDER — DICYCLOMINE HCL 20 MG PO TABS: 20.0000 mg | ORAL_TABLET | Freq: Two times a day (BID) | ORAL | 0 refills | Status: DC

## 2022-02-14 MED ORDER — ONDANSETRON 4 MG PO TBDP: 4.0000 mg | ORAL_TABLET | Freq: Three times a day (TID) | ORAL | 0 refills | Status: DC | PRN

## 2022-02-14 MED ORDER — SODIUM CHLORIDE 0.9 % IV BOLUS
1000.0000 mL | Freq: Once | INTRAVENOUS | Status: AC
  Administered 2022-02-14: 1000 mL via INTRAVENOUS

## 2022-02-14 NOTE — Discharge Instructions (Signed)
As discussed, your evaluation today has been largely reassuring.  But, it is important that you monitor your condition carefully, and do not hesitate to return to the ED if you develop new, or concerning changes in your condition. ? ?Otherwise, please follow-up with your physician for appropriate ongoing care. ? ?

## 2022-02-14 NOTE — ED Provider Notes (Signed)
[facility] DEPT Provider Note   CSN: [id number] Arrival date & time: 02/14/22  1351     History  Chief Complaint  Patient presents with   Abdominal Pain   Emesis    [name] is a 61 y.o. female.  HPI Patient presents with abdominal pain.  She arrives via EMS, details obtained by those individuals, and nursing staff.  She notes history of chronic constipation.  Now, over the past 4 days she has had nausea, vomiting, inability to tolerate p.o. other than trivial amounts of liquids.  She has had multiple episodes of vomiting.  In addition patient is concerned about hypoglycemia, with a glucose of 56 today after eating chocolate. She reports fever at home, new as well.    Home Medications Prior to Admission medications   Medication Sig Start Date End Date Taking? Authorizing Provider  Accu-Chek Softclix Lancets lancets Use as instructed 11/23/21   Ladell Pier, MD  amLODipine (NORVASC) 10 MG tablet TAKE 1 TABLET (10 MG TOTAL) BY MOUTH DAILY. 10/16/21 10/16/22  Ladell Pier, MD  aspirin 81 MG chewable tablet Chew 81 mg by mouth daily.    [provider]  Blood Glucose Monitoring Suppl (ACCU-CHEK GUIDE) w/Device KIT UAD 11/23/21   Ladell Pier, MD  Calcium Carbonate-Vit D-Min (CALCIUM 1200 PO) Take 1,200 mg by mouth daily.    [provider]  Continuous Blood Gluc Sensor (FREESTYLE LIBRE SENSOR SYSTEM) MISC Change sensor Q 2 wks 07/19/21   Ladell Pier, MD  Continuous Blood Gluc Sensor (FREESTYLE LIBRE SENSOR SYSTEM) MISC Change sensor Q 2 wks 10/26/21   Ladell Pier, MD  glucose blood (ACCU-CHEK GUIDE) test strip Use as instructed 11/23/21   Ladell Pier, MD  hydrALAZINE (APRESOLINE) 10 MG tablet Take 1 tablet (10 mg total) by mouth 3 (three) times daily. 10/16/21   Ladell Pier, MD  hydrocortisone (CORTEF) 10 MG tablet Take 3 tablets (30 mg total) by mouth daily. 02/03/22   Renato Shin, MD   isosorbide mononitrate (IMDUR) 60 MG 24 hr tablet TAKE 1 TABLET BY MOUTH DAILY 10/16/21 10/16/22  Ladell Pier, MD  levothyroxine (SYNTHROID) 150 MCG tablet TAKE 1 TABLET BY MOUTH DAILY BEFORE BREAKFAST. 10/16/21 10/16/22  Ladell Pier, MD  levothyroxine (SYNTHROID) 25 MCG tablet 1/2 tab PO daily with the 150 mcg tab 01/20/22   Ladell Pier, MD  Multiple Vitamin (MULTIVITAMIN WITH MINERALS) TABS tablet Take 1 tablet by mouth daily.    [provider]  omeprazole (PRILOSEC) 40 MG capsule TAKE 1 CAPSULE (40 MG TOTAL) BY MOUTH DAILY. 08/31/21 08/31/22  Ladell Pier, MD  ondansetron (ZOFRAN) 4 MG tablet Take 1 tablet (4 mg total) by mouth daily as needed for nausea or vomiting. 10/27/21   Ladell Pier, MD  polyethylene glycol (MIRALAX / GLYCOLAX) 17 g packet Take 17 g by mouth daily. 05/17/21   Ghimire, Henreitta Leber, MD  sertraline (ZOLOFT) 50 MG tablet TAKE 1/2 TABLET BY MOUTH DAILY 01/01/22   Ladell Pier, MD  torsemide (DEMADEX) 20 MG tablet TAKE 1 TABLET BY MOUTH DAILY AS NEEDED (FOR WEIGHT GAIN OF 3LBS IN 1 DAY OR 5 LBS IN 2 DAYS.). 02/03/22   Ladell Pier, MD  traMADol (ULTRAM) 50 MG tablet Take 1 tablet (50 mg total) by mouth 2 (two) times daily as needed. 01/04/22   Ladell Pier, MD  vitamin B-12 (CYANOCOBALAMIN) 1000 MCG tablet Take 1 tablet (1,000 mcg total) by  mouth daily. 05/16/21   Ghimire, Henreitta Leber, MD      Allergies    Beef-derived products, Pork-derived products, and Percocet [oxycodone-acetaminophen]    Review of Systems   Review of Systems  Constitutional:        Per HPI, otherwise negative  HENT:         Per HPI, otherwise negative  Respiratory:         Per HPI, otherwise negative  Cardiovascular:        Per HPI, otherwise negative  Gastrointestinal:  Positive for abdominal pain, nausea and vomiting.  Endocrine:       Negative aside from HPI  Genitourinary:        Neg aside from HPI   Musculoskeletal:        Per HPI,  otherwise negative  Skin: Negative.   Neurological:  Negative for syncope.   Physical Exam Updated Vital Signs Pulse (!) 57    Temp 99.7 F (37.6 C) (Oral)    Resp 19    SpO2 100%  Physical Exam Vitals and nursing note reviewed.  Constitutional:      Appearance: She is ill-appearing and diaphoretic.  HENT:     Head: Normocephalic and atraumatic.  Eyes:     Conjunctiva/sclera: Conjunctivae normal.  Cardiovascular:     Rate and Rhythm: Normal rate and regular rhythm.  Pulmonary:     Effort: Pulmonary effort is normal. No respiratory distress.     Breath sounds: Normal breath sounds. No stridor.  Abdominal:     General: There is no distension.     Tenderness: There is generalized abdominal tenderness. There is guarding.  Skin:    General: Skin is warm.  Neurological:     Mental Status: She is alert and oriented to person, place, and time.     Cranial Nerves: No cranial nerve deficit.    ED Results / Procedures / Treatments   Labs (all labs ordered are listed, but only abnormal results are displayed) Labs Reviewed  COMPREHENSIVE METABOLIC PANEL  LIPASE, BLOOD  CBC WITH DIFFERENTIAL/PLATELET  URINALYSIS, ROUTINE W REFLEX MICROSCOPIC    EKG None  Radiology No results found.  Procedures Procedures    Medications Ordered in ED Medications  fentaNYL (SUBLIMAZE) injection 25 mcg (has no administration in time range)  ondansetron (ZOFRAN) injection 4 mg (has no administration in time range)  sodium chloride 0.9 % bolus 1,000 mL (has no administration in time range)    And  0.9 %  sodium chloride infusion (has no administration in time range)    ED Course/ Medical Decision Making/ A&P  This patient presents to the ED for concern of abdominal pain, nausea, vomiting, this involves an extensive number of treatment options, and is a complaint that carries with it a high risk of complications and morbidity.  The differential diagnosis includes bowel obstruction,  diverticulitis, colitis, other intra-abdominal processes   Co morbidities that complicate the patient evaluation  Age   Social Determinants of Health:  Limited English   Additional history obtained:  Additional history and/or information obtained from daughter External records from outside source obtained and reviewed including ongoing chronic pain   After the initial evaluation, orders, including: Labs, fluid resuscitation, antiemetics, analgesia, CT scan were initiated.  Patient placed on Cardiac and Pulse-Oximetry Monitors. The patient was maintained on a cardiac monitor.  The cardiac monitored showed an rhythm of sinus rhythm, 60 The patient was also maintained on pulse oximetry. The readings were typically 99% room air  normal                          On repeat evaluation of the patient improved Patient has been tolerated oral liquids, solids, after Bentyl with sleeping Lab Tests:  I personally interpreted labs.  The pertinent results include: Labs with dehydration, otherwise somewhat reassuring, urinalysis without notable fine  Imaging Studies ordered:  I independently visualized and interpreted imaging which showed unremarkable CT scan I agree with the radiologist interpretation    Dispostion / Final MDM:  After consideration of the diagnostic results and the patient's response to treatment, given his sleeping status, tolerance of oral intake, acknowledgment of her chronic abdominal pain, with reassuring CT scan, labs, urinalysis, no evidence of bacteremia, sepsis, obstruction, abscess, diverticulitis.  Daughter amenable to discharge, the patient is scheduled follow-up with primary care in about 2 weeks.  Patient will start new medication regimen, follow-up as instructed.  Final Clinical Impression(s) / ED Diagnoses Final diagnoses:  Generalized abdominal pain    Rx / DC Orders ED Discharge Orders          Ordered    dicyclomine (BENTYL) 20 MG tablet  2 times  daily        02/14/22 2217    ondansetron (ZOFRAN-ODT) 4 MG disintegrating tablet  Every 8 hours PRN        02/14/22 2217              Carmin Muskrat, MD 02/14/22 2217

## 2022-02-14 NOTE — ED Triage Notes (Signed)
BIBA Per EMS: Pt coming from home w/ c/o abd pain, N/V x4days. Pt reports low CBG of 54 this morning. Ate chocolate, CBG 94 with EMS. VSS. Chronic constipation

## 2022-02-17 DIAGNOSIS — Z419 Encounter for procedure for purposes other than remedying health state, unspecified: Secondary | ICD-10-CM | POA: Diagnosis not present

## 2022-02-22 ENCOUNTER — Ambulatory Visit (INDEPENDENT_AMBULATORY_CARE_PROVIDER_SITE_OTHER): Payer: Medicaid Other | Admitting: Cardiology

## 2022-02-22 ENCOUNTER — Other Ambulatory Visit: Payer: Self-pay

## 2022-02-22 ENCOUNTER — Encounter (HOSPITAL_BASED_OUTPATIENT_CLINIC_OR_DEPARTMENT_OTHER): Payer: Self-pay | Admitting: Cardiology

## 2022-02-22 VITALS — BP 152/52 | HR 44 | Ht 64.0 in | Wt 100.4 lb

## 2022-02-22 DIAGNOSIS — R001 Bradycardia, unspecified: Secondary | ICD-10-CM

## 2022-02-22 DIAGNOSIS — I10 Essential (primary) hypertension: Secondary | ICD-10-CM

## 2022-02-22 DIAGNOSIS — N184 Chronic kidney disease, stage 4 (severe): Secondary | ICD-10-CM | POA: Diagnosis not present

## 2022-02-22 DIAGNOSIS — I5032 Chronic diastolic (congestive) heart failure: Secondary | ICD-10-CM | POA: Diagnosis not present

## 2022-02-22 NOTE — Patient Instructions (Signed)

## 2022-02-22 NOTE — Progress Notes (Signed)
Cardiology Office Note:    Date:  02/22/2022   ID:  [name], DOB [birth year], MRN [id number]  PCP:  Ladell Pier, MD  Cardiologist:  Buford Dresser, MD  Referring MD: Ladell Pier, MD   CC: Follow up evaluation for diastolic heart failure.  History of Present Illness:    [name] is a 61 y.o. female with a hx of hypertension, type II diabetes, chronic kidney disease who is seen for follow-up. I initially met her 12/26/2020 as a new consult at the request of Ladell Pier, MD for the evaluation and management of diastolic heart failure.  Today: She is accompanied by her daughter who also assists with the history. Overall, she is feeling okay aside from a cough. Recently she has not had any dizzy spells.  Her blood pressure is elevated at 158/50 in clinic today, which they attribute to white coat syndrome as well as becoming short winded with walking. At home, her blood pressure is sometimes high.  Concerning her diet she is scheduled to see a nutritionist later this month. Her weight was down to 94 lbs at one time, but is generally stable around 97 lbs on average.  She ambulates independently with a cane due to some imbalance, no recent falls.   Since her last visit she was seen by neurology. It was recommended she plan for a spinal tap in 3 months.  She denies any palpitations, chest pain, or peripheral edema. No headaches, syncope, orthopnea, or PND.   Past Medical History:  Diagnosis Date   Adrenal insufficiency, primary ([place])    Anemia 2018   Hgb 6.8 early 11/2020.  PRBC x 1.  06/2017 PRBC x 1.     Cerebral ventriculomegaly    CHF (congestive heart failure) ([place]) 27/7412   Diastolic   CKD (chronic kidney disease) stage 4, GFR 15-29 ml/min ([place]) 04/2021   Stage 4 as of late May 2022   Diabetes mellitus without complication ([place])    Hypertension    Hypothyroidism     Past Surgical History:  Procedure Laterality Date   CESAREAN SECTION      IR THORACENTESIS ASP PLEURAL SPACE W/IMG GUIDE  06/05/2021    Current Medications: Current Outpatient Medications on File Prior to Visit  Medication Sig   Accu-Chek Softclix Lancets lancets Use as instructed   amLODipine (NORVASC) 10 MG tablet TAKE 1 TABLET (10 MG TOTAL) BY MOUTH DAILY.   aspirin 81 MG chewable tablet Chew 81 mg by mouth daily.   Blood Glucose Monitoring Suppl (ACCU-CHEK GUIDE) w/Device KIT UAD   Calcium Carbonate-Vit D-Min (CALCIUM 1200 PO) Take 1,200 mg by mouth daily.   dicyclomine (BENTYL) 20 MG tablet Take 1 tablet (20 mg total) by mouth 2 (two) times daily.   glucose blood (ACCU-CHEK GUIDE) test strip Use as instructed   hydrALAZINE (APRESOLINE) 10 MG tablet Take 1 tablet (10 mg total) by mouth 3 (three) times daily.   hydrocortisone (CORTEF) 10 MG tablet Take 3 tablets (30 mg total) by mouth daily.   isosorbide mononitrate (IMDUR) 60 MG 24 hr tablet TAKE 1 TABLET BY MOUTH DAILY   levothyroxine (SYNTHROID) 150 MCG tablet TAKE 1 TABLET BY MOUTH DAILY BEFORE BREAKFAST.   levothyroxine (SYNTHROID) 25 MCG tablet 1/2 tab PO daily with the 150 mcg tab   Multiple Vitamin (MULTIVITAMIN WITH MINERALS) TABS tablet Take 1 tablet by mouth daily.   omeprazole (PRILOSEC) 40 MG capsule TAKE 1 CAPSULE (40 MG TOTAL) BY MOUTH DAILY.   ondansetron (  ZOFRAN) 4 MG tablet Take 1 tablet (4 mg total) by mouth daily as needed for nausea or vomiting.   ondansetron (ZOFRAN-ODT) 4 MG disintegrating tablet Take 1 tablet (4 mg total) by mouth every 8 (eight) hours as needed for nausea or vomiting.   polyethylene glycol (MIRALAX / GLYCOLAX) 17 g packet Take 17 g by mouth daily.   sertraline (ZOLOFT) 50 MG tablet TAKE 1/2 TABLET BY MOUTH DAILY   torsemide (DEMADEX) 20 MG tablet TAKE 1 TABLET BY MOUTH DAILY AS NEEDED (FOR WEIGHT GAIN OF 3LBS IN 1 DAY OR 5 LBS IN 2 DAYS.).   traMADol (ULTRAM) 50 MG tablet Take 1 tablet (50 mg total) by mouth 2 (two) times daily as needed.   vitamin B-12  (CYANOCOBALAMIN) 1000 MCG tablet Take 1 tablet (1,000 mcg total) by mouth daily.   No current facility-administered medications on file prior to visit.     Allergies:   Beef-derived products, Pork-derived products, and Percocet [oxycodone-acetaminophen]   Social History   Tobacco Use   Smoking status: Never   Smokeless tobacco: Never  Vaping Use   Vaping Use: Never used  Substance Use Topics   Alcohol use: No   Drug use: No    Family History: family history includes Diabetes in her mother; Hyperlipidemia in her father; Hypertension in her father. There is no history of Adrenal disorder.  ROS:   Please see the history of present illness. (+) Cough (+) Shortness of breath (+) Imbalance All other systems are reviewed and negative.   EKGs/Labs/Other Studies Reviewed:    The following studies were reviewed today:  Echo 06/05/21  1. Limited echo to look again for possible endocarditis.   2. Left ventricular ejection fraction, by estimation, is 55 to 60%. The  left ventricle has normal function.   3. The mitral valve is abnormal. Trivial mitral valve regurgitation.  Moderate mitral annular calcification.   4. The aortic valve is normal in structure. Aortic valve regurgitation is  not visualized. No aortic stenosis is present.  Echo 06/03/21  1. Left ventricular ejection fraction, by estimation, is 55 to 60%. The  left ventricle has normal function. The left ventricle has no regional  wall motion abnormalities. Left ventricular diastolic parameters are  consistent with Grade II diastolic  dysfunction (pseudonormalization).   2. Right ventricular systolic function is normal. The right ventricular  size is normal.   3. Left atrial size was mildly dilated.   4. Right atrial size was moderately dilated.   5. The mitral valve is grossly normal. Mild mitral valve regurgitation.  No evidence of mitral stenosis.   6. The aortic valve is grossly normal. Aortic valve regurgitation  is not  visualized. No aortic stenosis is present.   US Renal Artery Duplex 03/13/2021: FINDINGS: Right Kidney: Length: 9.6 cm. Echogenicity within normal limits. No mass or hydronephrosis visualized.   Left Kidney: Length: 8.8 cm. Echogenicity within normal limits. No mass or hydronephrosis visualized.   Bladder:  Unremarkable   RENAL DUPLEX ULTRASOUND Right Renal Artery Velocities: Origin:  347 cm/sec Mid:  213 cm/sec Hilum:  130 cm/sec Interlobar:  33 cm/sec Arcuate:  22 cm/sec   Left Renal Artery Velocities: Origin:  Obscured by shadowing bowel gas Mid:  251 cm/sec Hilum:  148 cm/sec Interlobar:  41 cm/sec Arcuate:  23 cm/sec Aortic Velocity:  108 cm/sec  Right Renal-Aortic Ratios: Origin: 3.2 Mid:  2.0 Hilum: 1.2 Interlobar: 0.3 Arcuate: 0.2  Left Renal-Aortic Ratios: Origin: Not visualized Mid: 2.3 Hilum: 1.4  Interlobar: 0.4 Arcuate: 0.2   Renal veins are patent. Atherosclerotic changes seen throughout the infrarenal abdominal aorta.   IMPRESSION: Doppler findings suspicious for bilateral renal artery stenosis, worse on the right.  Echo 11/21/20 1. Left ventricular ejection fraction, by estimation, is 65 to 70%. The  left ventricle has normal function. The left ventricle has no regional  wall motion abnormalities. Left ventricular diastolic parameters are  consistent with Grade II diastolic  dysfunction (pseudonormalization).   2. Right ventricular systolic function is normal. The right ventricular  size is normal. There is mildly elevated pulmonary artery systolic  pressure.   3. The mitral valve is grossly normal. Mild mitral valve regurgitation.  No evidence of mitral stenosis.   4. The aortic valve is normal in structure. Aortic valve regurgitation is  not visualized. No aortic stenosis is present.   5. The inferior vena cava is normal in size with greater than 50%  respiratory variability, suggesting right atrial pressure of 3 mmHg.   EKG:   EKG is personally reviewed.   02/22/2022: sinus bradycardia at 45 bpm, nonspecific ST pattern 08/20/2021: NSR at 63 bpm, nonspecific ST-T pattern 07/24/2021: Sinus bradycardia at 51 bpm, LVH 05/27/2021: EKG was not ordered. 12/26/2020: sinus bradycardia at 57 bpm  Recent Labs: 05/16/2021: Magnesium 2.3 06/01/2021: B Natriuretic Peptide 1,106.6 01/18/2022: TSH 4.810 02/14/2022: ALT 25; BUN 26; Creatinine, Ser 1.59; Hemoglobin 9.0; Platelets 141; Potassium 4.0; Sodium 133   Recent Lipid Panel    Component Value Date/Time   CHOL 100 01/18/2022 1050   TRIG 94 01/18/2022 1050   HDL 32 (L) 01/18/2022 1050   CHOLHDL 3.1 01/18/2022 1050   CHOLHDL 3.4 06/13/2011 0810   VLDL 17 06/13/2011 0810   LDLCALC 50 01/18/2022 1050    Physical Exam:    VS:  BP (!) 152/52 (BP Location: Right Arm, Patient Position: Sitting, Cuff Size: Normal)    Pulse (!) 44    Ht $R'5\' 4"'sb$  (1.626 m)    Wt 100 lb 6.4 oz (45.5 kg)    SpO2 99%    BMI 17.23 kg/m     Wt Readings from Last 3 Encounters:  02/22/22 100 lb 6.4 oz (45.5 kg)  02/10/22 97 lb (44 kg)  02/03/22 97 lb (44 kg)    GEN: Well nourished, well developed in no acute distress HEENT: Normal, moist mucous membranes NECK: No JVD CARDIAC: regular rhythm, normal S1 and S2, no rubs or gallops. 2/6 systolic murmur. VASCULAR: Radial and DP pulses 2+ bilaterally. No carotid bruits. +renal bruit RESPIRATORY:  Clear to auscultation without rales, wheezing or rhonchi  ABDOMEN: Soft, non-tender, non-distended MUSCULOSKELETAL:  Ambulates independently SKIN: Warm and dry, no LE edema NEUROLOGIC:  Alert and oriented x 3. No focal neuro deficits noted. PSYCHIATRIC:  Normal affect    ASSESSMENT:    1. Chronic diastolic heart failure ([place])   2. Primary hypertension   3. CKD (chronic kidney disease) stage 4, GFR 15-29 ml/min ([facility])   4. Sinus bradycardia     PLAN:    Chronic diastolic heart failure -euvolemic today, continue torsemide PRN -renal function precludes use of  entresto or SGLT2i  Hypertension, labile Chronic kidney disease stage 4 -BP has been labile, both low/high. Low diastolic number today, will not make changes -taking amlodipine 10 mg daily -she was previously on hydralazine 100 mg TID; currently on 10 mg TID. Attempted to increase but was symptomatic -with low diastolic number, will avoid increasing imdur.  -may need to use MAP goal given low  diastolic numbers -last GFR 21, consistent with chronic kidney disease stage 4 -given CKD, will not start ACEi/ARB/ARNI/MRA/diuretic.  -+renal bruit, dopplers suggest bilateral renal artery stenosis. Following with nephrology -counseled on red flag signs, when to call office, when to present to ER  Sinus bradycardia -asymptomatic, on no AV nodal agents  Type II diabetes -has had recent low blood sugars, off all hypoglycemics  Cardiac risk counseling and prevention recommendations: -recommend heart healthy/Mediterranean diet, with whole grains, fruits, vegetable, fish, lean meats, nuts, and olive oil. Limit salt. -recommend moderate walking, 3-5 times/week for 30-50 minutes each session. Aim for at least 150 minutes.week. Goal should be pace of 3 miles/hours, or walking 1.5 miles in 30 minutes -recommend avoidance of tobacco products. Avoid excess alcohol. -ASCVD risk score: The ASCVD Risk score (Arnett DK, et al., 2019) failed to calculate for the following reasons:   The valid total cholesterol range is 130 to 320 mg/dL    Plan for follow up: 1 year or sooner as needed  Buford Dresser, MD, PhD, [facility]    Medication Adjustments/Labs and Tests Ordered: Current medicines are reviewed at length with the patient today.  Concerns regarding medicines are outlined above.   Orders Placed This Encounter  Procedures   EKG 12-Lead   No orders of the defined types were placed in this encounter.  Patient Instructions  Medication Instructions:  Your Physician  recommend you continue on your current medication as directed.    *If you need a refill on your cardiac medications before your next appointment, please call your pharmacy*   Lab Work: None ordered today   Testing/Procedures: None ordered today   Follow-Up: At [facility], you and your health needs are our priority.  As part of our continuing mission to provide you with exceptional heart care, we have created designated Provider Care Teams.  These Care Teams include your primary Cardiologist (physician) and Advanced Practice Providers (APPs -  Physician Assistants and Nurse Practitioners) who all work together to provide you with the care you need, when you need it.  We recommend signing up for the patient portal called "MyChart".  Sign up information is provided on this After Visit Summary.  MyChart is used to connect with patients for Virtual Visits (Telemedicine).  Patients are able to view lab/test results, encounter notes, upcoming appointments, etc.  Non-urgent messages can be sent to your provider as well.   To learn more about what you can do with MyChart, go to [email or web address].    Your next appointment:   1 year(s)  The format for your next appointment:   In Person  Provider:   Buford Dresser, MD        [facility],acting as a scribe for Buford Dresser, MD.,have documented all relevant documentation on the behalf of Buford Dresser, MD,as directed by  Buford Dresser, MD while in the presence of Buford Dresser, MD.  I, Buford Dresser, MD, have reviewed all documentation for this visit. The documentation on 02/22/22 for the exam, diagnosis, procedures, and orders are all accurate and complete.   Signed, Buford Dresser, MD PhD 02/22/2022   Story

## 2022-02-23 ENCOUNTER — Encounter (HOSPITAL_BASED_OUTPATIENT_CLINIC_OR_DEPARTMENT_OTHER): Payer: Self-pay | Admitting: Family

## 2022-03-01 ENCOUNTER — Other Ambulatory Visit: Payer: Self-pay

## 2022-03-01 ENCOUNTER — Ambulatory Visit: Payer: Medicaid Other | Attending: Internal Medicine | Admitting: Internal Medicine

## 2022-03-01 ENCOUNTER — Encounter: Payer: Self-pay | Admitting: Internal Medicine

## 2022-03-01 VITALS — BP 161/61 | HR 66 | Resp 16 | Ht 64.0 in | Wt 100.1 lb

## 2022-03-01 DIAGNOSIS — D638 Anemia in other chronic diseases classified elsewhere: Secondary | ICD-10-CM | POA: Diagnosis not present

## 2022-03-01 DIAGNOSIS — R7989 Other specified abnormal findings of blood chemistry: Secondary | ICD-10-CM

## 2022-03-01 DIAGNOSIS — E039 Hypothyroidism, unspecified: Secondary | ICD-10-CM

## 2022-03-01 DIAGNOSIS — I129 Hypertensive chronic kidney disease with stage 1 through stage 4 chronic kidney disease, or unspecified chronic kidney disease: Secondary | ICD-10-CM | POA: Diagnosis not present

## 2022-03-01 DIAGNOSIS — M170 Bilateral primary osteoarthritis of knee: Secondary | ICD-10-CM | POA: Diagnosis not present

## 2022-03-01 DIAGNOSIS — H9201 Otalgia, right ear: Secondary | ICD-10-CM | POA: Diagnosis not present

## 2022-03-01 MED ORDER — TRAMADOL HCL 50 MG PO TABS: 50.0000 mg | ORAL_TABLET | Freq: Two times a day (BID) | ORAL | 1 refills | Status: DC | PRN

## 2022-03-01 MED ORDER — DICYCLOMINE HCL 20 MG PO TABS: 20.0000 mg | ORAL_TABLET | Freq: Every day | ORAL | 1 refills | Status: DC | PRN

## 2022-03-01 NOTE — Progress Notes (Signed)
Patient ID: [name], female    DOB: 07-Sep-1961  MRN: [id number]  CC: Diabetes and Ear Pain   Subjective: [name] is a 61 y.o. female who presents for 6 wks f/u.  [name] is with her. Her concerns today include:  [name] is a 61 year old female with a history of chronic adrenal insufficiency, DM type 2 (followed by Dr. Loanne Drilling), HTN, stage 4 CKD, diastolic CHF (EF of 65 to 70%, followed by Dr. Harrell Gave), hypothyroid, GERD   Abn LFT on last visit, we had done blood tests.  Her liver enzymes were elevated.  Patient was advised to stop Lipitor 20 mg and Tylenol.  She was to return to the lab in 2 weeks for repeat LFTs and hepatitis C screen.  She did not come back to the lab that had labs done recently through the emergency room.  LFTs had normalized.Marland Kitchen  Hypothyroid: On last visit, TSH was not at goal, it was 4.8.  We increase levothyroxine from 150 mcg daily to 162 1/2 mcg daily.  She reports compliance with taking the levothyroxine.  Anemia: Patient's daughter reports that she has had 2 injections so far through the infusion center that was ordered by the nephrologist.  I think this was probably an ESA agent  Chronic adrenal insuff: She saw Dr. Loanne Drilling 1 month ago.  He increased Cortef to 30 mg daily.  Since then, patient reports that her blood sugars have been fine and she has not had any low blood sugar episodes.  She continues to be observed off diabetic medication.    C/o throbbing pain RT ear that is intermittent x3 days.  No drainage from the ear.  She does not use any Q-tip in the ear.     HTN: Reports compliance with medications including Imdur, hydralazine and Norvasc.  She forgot to take her second dose of hydralazine today before coming to this appointment.  Checks BP once a day.  Range 139-140s.  Sometimes up to 160.  DBP always low with tange 40-60s.  She saw her cardiologist Dr. Harrell Gave recently.  Systolic blood pressure was elevated but patient had low  diastolic so no medication change was made.  She is requesting refill on tramadol which she takes for arthritis in the knees.  Patient reports good relief of pain when she takes the tramadol.  Denies any significant side effects.  She has not had any falls.  She ambulates with a cane.  Daughter also request refill on Bentyl.  This was prescribed for her on recent hospital visit on 02/14/2022 for vomiting and abdominal pain.  CAT scan of the abdomen revealed no acute findings.  Prescription was written for 20 mg to be taken twice a day. 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   Hypokalemia 05/12/2021   Fecal impaction ([place]) 05/12/2021   Anemia due to chronic kidney disease 05/12/2021   Adrenal insufficiency (Addison's disease) ([place]) 05/12/2021   CKD (chronic kidney disease) stage 4, GFR 15-29 ml/min ([place]) 11/21/2020   Primary hypertension 11/21/2020   New onset of congestive heart failure ([place]) 11/21/2020  Dehydration 12/09/2017   Abdominal pain, bilateral lower quadrant 12/09/2017   Hypoglycemia 07/08/2017   Hypoglycemia associated with diabetes ([place]) 05/10/2017   Malnutrition of moderate degree 05/10/2017     Current Outpatient Medications on File Prior to Visit  Medication Sig Dispense Refill   Accu-Chek Softclix Lancets lancets Use as instructed 100 each 12   amLODipine (NORVASC) 10 MG tablet TAKE 1 TABLET (10 MG TOTAL) BY MOUTH DAILY. 30 tablet 11   aspirin 81 MG chewable tablet Chew 81 mg by mouth daily.     Blood Glucose Monitoring  Suppl (ACCU-CHEK GUIDE) w/Device KIT UAD 1 kit 0   Calcium Carbonate-Vit D-Min (CALCIUM 1200 PO) Take 1,200 mg by mouth daily.     glucose blood (ACCU-CHEK GUIDE) test strip Use as instructed 100 each 12   hydrALAZINE (APRESOLINE) 10 MG tablet Take 1 tablet (10 mg total) by mouth 3 (three) times daily. 90 tablet 6   hydrocortisone (CORTEF) 10 MG tablet Take 3 tablets (30 mg total) by mouth daily. 90 tablet 5   isosorbide mononitrate (IMDUR) 60 MG 24 hr tablet TAKE 1 TABLET BY MOUTH DAILY 90 tablet 6   levothyroxine (SYNTHROID) 150 MCG tablet TAKE 1 TABLET BY MOUTH DAILY BEFORE BREAKFAST. 30 tablet 6   levothyroxine (SYNTHROID) 25 MCG tablet 1/2 tab PO daily with the 150 mcg tab 30 tablet 3   Multiple Vitamin (MULTIVITAMIN WITH MINERALS) TABS tablet Take 1 tablet by mouth daily.     omeprazole (PRILOSEC) 40 MG capsule TAKE 1 CAPSULE (40 MG TOTAL) BY MOUTH DAILY. 30 capsule 0   ondansetron (ZOFRAN) 4 MG tablet Take 1 tablet (4 mg total) by mouth daily as needed for nausea or vomiting. 30 tablet 3   ondansetron (ZOFRAN-ODT) 4 MG disintegrating tablet Take 1 tablet (4 mg total) by mouth every 8 (eight) hours as needed for nausea or vomiting. 20 tablet 0   polyethylene glycol (MIRALAX / GLYCOLAX) 17 g packet Take 17 g by mouth daily. 30 each 0   sertraline (ZOLOFT) 50 MG tablet TAKE 1/2 TABLET BY MOUTH DAILY 45 tablet 0   torsemide (DEMADEX) 20 MG tablet TAKE 1 TABLET BY MOUTH DAILY AS NEEDED (FOR WEIGHT GAIN OF 3LBS IN 1 DAY OR 5 LBS IN 2 DAYS.). 60 tablet 1   vitamin B-12 (CYANOCOBALAMIN) 1000 MCG tablet Take 1 tablet (1,000 mcg total) by mouth daily. 30 tablet 0   No current facility-administered medications on file prior to visit.    Allergies  Allergen Reactions   Beef-Derived Products     RELIGOUS  REASONS   Pork-Derived Products     RELIGIOUS REASONS   Percocet [Oxycodone-Acetaminophen] Rash    Social History   Socioeconomic History   Marital status: Single    Spouse name: Not on  file   Number of children: 5   Years of education: Not on file   Highest education level: High school graduate  Occupational History   Not on file  Tobacco Use   Smoking status: Never   Smokeless tobacco: Never  Vaping Use   Vaping Use: Never used  Substance and Sexual Activity   Alcohol use: No   Drug use: No   Sexual activity: Not on file  Other Topics Concern   Not on file  Social History Narrative   01/06/22 lives with son and his family   Social Determinants of Health   Financial Resource Strain: Not on file  Food Insecurity: Not on file  Transportation Needs: Not on file  Physical Activity: Not on file  Stress: Not on file  Social Connections: Not on file  Intimate Partner Violence: Not on file    Family History  Problem Relation Age of Onset   Diabetes Mother    Hyperlipidemia Father    Hypertension Father    Adrenal disorder Neg Hx     Past Surgical History:  Procedure Laterality Date   CESAREAN SECTION     IR THORACENTESIS ASP PLEURAL SPACE W/IMG GUIDE  06/05/2021    ROS: Review of Systems Negative except as stated above  PHYSICAL EXAM: BP (!) 161/61    Pulse 66    Resp 16    Ht $R'5\' 4"'mW$  (1.626 m)    Wt 100 lb 2 oz (45.4 kg)    SpO2 94%    BMI 17.19 kg/m   Wt Readings from Last 3 Encounters:  03/01/22 100 lb 2 oz (45.4 kg)  02/22/22 100 lb 6.4 oz (45.5 kg)  02/10/22 97 lb (44 kg)    Physical Exam BP 150/48 General appearance - alert, well appearing, and in no distress Mental status - normal mood, behavior, speech, dress, motor activity, and thought processes Ears - bilateral TM's and external ear canals normal Chest - clear to auscultation, no wheezes, rales or rhonchi, symmetric air entry Heart - normal rate, regular rhythm, normal S1, S2, no murmurs, rubs, clicks or gallops Musculoskeletal -knees: Mild joint enlargement.  Mild to moderate crepitus on passive range of motion. Extremities -trace edema at the ankles.   CMP Latest Ref Rng & Units  02/14/2022 01/18/2022 12/10/2021  Glucose 70 - 99 mg/dL 85 84 75  BUN 8 - 23 mg/dL 26(H) 23 38(H)  Creatinine 0.44 - 1.00 mg/dL 1.59(H) 1.60(H) 2.65(H)  Sodium 135 - 145 mmol/L 133(L) 139 132(L)  Potassium 3.5 - 5.1 mmol/L 4.0 4.3 4.0  Chloride 98 - 111 mmol/L 103 107(H) 100  CO2 22 - 32 mmol/L $RemoveB'26 21 26  'soqpmKAc$ Calcium 8.9 - 10.3 mg/dL 8.5(L) 8.9 8.8(L)  Total Protein 6.5 - 8.1 g/dL 7.4 7.6 -  Total Bilirubin 0.3 - 1.2 mg/dL 0.4 0.3 -  Alkaline Phos 38 - 126 U/L 105 241(H) -  AST 15 - 41 U/L 31 233(H) -  ALT 0 - 44 U/L 25 130(H) -   Lipid Panel     Component Value Date/Time   CHOL 100 01/18/2022 1050   TRIG 94 01/18/2022 1050   HDL 32 (L) 01/18/2022 1050   CHOLHDL 3.1 01/18/2022 1050   CHOLHDL 3.4 06/13/2011 0810   VLDL 17 06/13/2011 0810   LDLCALC 50 01/18/2022 1050    CBC    Component Value Date/Time   WBC 9.1 02/14/2022 1402   RBC 3.09 (L) 02/14/2022 1402   HGB 9.0 (L) 02/14/2022 1402   HGB 10.0 (L) 01/18/2022 1050   HCT 27.4 (L) 02/14/2022 1402   HCT 31.0 (L) 01/18/2022 1050   PLT 141 (L) 02/14/2022 1402   PLT 137 (L) 01/18/2022 1050   MCV 88.7 02/14/2022 1402   MCV 86 01/18/2022 1050   MCH 29.1 02/14/2022 1402   MCHC 32.8 02/14/2022 1402   RDW 14.8 02/14/2022 1402   RDW 15.9 (H) 01/18/2022 1050   LYMPHSABS 2.7 02/14/2022 1402   MONOABS 0.6 02/14/2022 1402   EOSABS 0.2 02/14/2022 1402   BASOSABS 0.1 02/14/2022 1402    ASSESSMENT AND PLAN: 1. Abnormal LFTs Normalized on repeat liver studies through the ER.  Continue to hold Lipitor as this was most likely the cause  2. Acquired hypothyroidism Continue levothyroxine - TSH  3. Osteoarthritis of both knees, unspecified osteoarthritis type Patient reports good pain relief with the medication.  She is functional.  She has not had any significant side effects from the medicine.  [place] controlled substance reporting system reviewed and is appropriate.  She has not had any aberrant behaviors. - traMADol (ULTRAM)  50 MG tablet; Take 1 tablet (50 mg total) by mouth 2 (two) times daily as needed.  Dispense: 60 tablet; Refill: 1  4. Anemia, chronic disease Continue to monitor.  She is receiving ESA through nephrology when needed  5. Ear pain, right Inform patient that ear exam is normal.  I see no signs of acute infection.  6. Hypertensive kidney disease Blood pressure elevated today.  However I have made no change in medication as she had not had her afternoon dose of hydralazine prior to coming.   In regards to the Bentyl, I told her that she should take it as needed and does not have to take that every day.  Patient was given the opportunity to ask questions.  Patient verbalized understanding of the plan and was able to repeat key elements of the plan.   This documentation was completed using Radio producer.  Any transcriptional errors are unintentional.  Orders Placed This Encounter  Procedures   TSH     Requested Prescriptions   Signed Prescriptions Disp Refills   traMADol (ULTRAM) 50 MG tablet 60 tablet 1    Sig: Take 1 tablet (50 mg total) by mouth 2 (two) times daily as needed.   dicyclomine (BENTYL) 20 MG tablet 30 tablet 1    Sig: Take 1 tablet (20 mg total) by mouth daily as needed for spasms.    Return in about 4 months (around 07/01/2022).  Karle Plumber, MD, FACP

## 2022-03-02 ENCOUNTER — Other Ambulatory Visit: Payer: Self-pay | Admitting: Internal Medicine

## 2022-03-02 DIAGNOSIS — E038 Other specified hypothyroidism: Secondary | ICD-10-CM

## 2022-03-02 LAB — TSH: TSH: 15.9 u[IU]/mL — ABNORMAL HIGH (ref 0.450–4.500)

## 2022-03-02 MED ORDER — LEVOTHYROXINE SODIUM 175 MCG PO TABS: 175.0000 ug | ORAL_TABLET | Freq: Every day | ORAL | 6 refills | Status: DC

## 2022-03-03 ENCOUNTER — Other Ambulatory Visit: Payer: Self-pay

## 2022-03-03 ENCOUNTER — Encounter: Payer: Self-pay | Admitting: Registered"

## 2022-03-03 ENCOUNTER — Encounter: Payer: Medicaid Other | Attending: Internal Medicine | Admitting: Registered"

## 2022-03-03 DIAGNOSIS — E440 Moderate protein-calorie malnutrition: Secondary | ICD-10-CM | POA: Diagnosis not present

## 2022-03-03 NOTE — Patient Instructions (Addendum)
-   Aim to have 3 meals a day to include 1/2 plate of non-starchy vegetables, 1/4 plate of carbohydrates, 1/4 plate of lean protein.  ? ?- Aim to have 2-3 snacks a day that include source of carbohydrates + protein: ?Trail mix with dried fruit ?Peanut butter and low-sodium crackers ?Fruit and peanut butter  ? ?- Have bedtime snack of carbohydrates and protein.  ? ?- Replace 1% milk with whole milk.  ? ?- Continue to drink 1 bottle a day of nutritional shake as snack option.  ? ?- Ideally eating about every 3 hours.  ?

## 2022-03-03 NOTE — Progress Notes (Signed)
Medical Nutrition Therapy  ?Appointment Start time:  10:13  Appointment End time:  11:00 ? ?Primary concerns today: Pt's daughter states a better way of keeping blood glucose under control, to reduce medication usage, and increase weight to be at healthy weight.  ?Referral diagnosis: malnutrition ?Preferred learning style: no preference indicated ?Learning readiness: ready, change in progress ? ? ?NUTRITION ASSESSMENT  ? ?Pt is diabetei ?Pt arrives with daughter. Daughter states pt has history of type 2 diabetes and was having low and high blood glucose numbers, as low as in the 30's. Reports numbers have increased and better managed. Since then pt is no longer taking atorvastatin and repaglinide and increased dosage of hydrocortisone. Daughter states pt needs to increase carbohydrate intake but also has to follow no salt diet for HTN and be mindful of increased blood glucose. States it is very difficult to manage it all.  ? ?Pt states she checks BS 1-2x/day: FBS (65-75), after breakfast (100-110) and after lunch/dinner (140-165).  ? ?Reports sources of protein: [place], chicken, goat, lamb, nuts, PB, beans, fish, shrimp. Likes a variety of food items; does not eat beef or pork. Reports family will buy toaster waffles with sugar-free syrup for pt to have on her own. States sometimes she eats cereal with 1% milk most times; will have whole milk if 1% milk is unavailable.  ? ?Reports challenges with sleeping for the past year. States she will sleep from 10 pm - 1 am (3 hours) and then stay awake for hours. States she will fall asleep again from 5 am - 11 am (6 hrs). States she is tired during the day if she doesn't sleep at night but will stay up all night if she naps during the day. States her daughter died in 03-28-20 who was pt's primary caretaker. Pt states they have always been together until daughters death at age 53. States she is currently taking a low dosage of zoloft to help with being more active and involved  since this life change. Reports she likes to do laundry, play games on ipad, and spend time with grandchildren. States she used to like to cook until daughter passed away. States she spends her day talking, watching things on her ipad, and going outside as the weather permits.  ? ?States lives with son, daughter-in-law, and 3 grandchildren. States they are home during the day and provide meals for her.   ? ?Pt expectations: better way of keeping blood glucose under control, to reduce medication usage, and increase weight to be at healthy weight. ? ? ?Clinical ?Today's weight: 98.3 ?Medical Hx: primary HTN, CHF, type 2 diabetes, adrenal insufficiency, CKD stage 4, anemia, malnutrition ?Medications: See list ?Labs: recent A1c (5.0), increased TSH (15.9), decreased HDL (32) ?Notable Signs/Symptoms: increased fatigue ? ?Lifestyle & Dietary Hx ? ?Estimated daily fluid intake: 76 oz ?Supplements: See list ?Sleep: 8 hrs/day ?Stress / self-care: likes to go outside and spend time with grandchildren ?Current average weekly physical activity: walks outside during the summer, swings outside when weather permits, walks up and down the stairs ? ?24-Hr Dietary Recall ?First Meal (9 am): mini croissant + cup of tea ?Snack: ?Second Meal (11:30 am): banana + tangerine + sugar-free cookie or PB crackers + cup of tea ?Snack:  ?Third Meal (7 pm): roti + lamb curry + goat ?Snack (11 pm): 2 sugar-free cookies ?Beverages: water (1.5*16 oz; 24 oz), sparkling water (1*20 oz; 20 oz), sprite zero (2*16 oz; 32 oz); 76 oz ? ? ?NUTRITION DIAGNOSIS  ?  NB-1.1 Food and nutrition-related knowledge deficit As related to lack of prior nutrition-related education.  As evidenced by change in existing condition. ? ? ?NUTRITION INTERVENTION  ?Nutrition education (E-1) on the following topics: Pt and daughter were educated and counseled on ways to increase nourishment for patient. Discussed benefits of having nutritional shake + snacks between meals,  meal/snack planning, and Rule of 3's. Encouraged caretaker to continue having higher fat and calorically dense food options such as whole milk versus 1% milk. Pt and daughter agreed with goals listed. ? ?Handouts Provided Include  ?My Plate for prediabetes (as meal guide) ? ?Learning Style & Readiness for Change ?Teaching method utilized: Visual & Auditory  ?Demonstrated degree of understanding via: Teach Back  ?Barriers to learning/adherence to lifestyle change: none identified ? ?Goals Established by Pt ?Aim to have 3 meals a day to include 1/2 plate of non-starchy vegetables, 1/4 plate of carbohydrates, 1/4 plate of lean protein.  ?Aim to have 2-3 snacks a day that include source of carbohydrates + protein: ?Trail mix with dried fruit ?Peanut butter and low-sodium crackers ?Fruit and peanut butter  ?Have bedtime snack of carbohydrates and protein.  ?Replace 1% milk with whole milk.  ?Continue to drink 1 bottle a day of nutritional shake as snack option.  ?Ideally eating about every 3 hours.  ? ? ?MONITORING & EVALUATION ?Dietary intake, weekly physical activity. ? ?Next Steps  ?Patient is to follow-up in 3 weeks. ?

## 2022-03-04 ENCOUNTER — Encounter: Payer: Self-pay | Admitting: Internal Medicine

## 2022-03-04 ENCOUNTER — Other Ambulatory Visit: Payer: Self-pay | Admitting: Internal Medicine

## 2022-03-04 MED ORDER — SERTRALINE HCL 50 MG PO TABS: 25.0000 mg | ORAL_TABLET | Freq: Every day | ORAL | 6 refills | Status: DC

## 2022-03-04 NOTE — Telephone Encounter (Signed)
rx was signed today by provider. 03/04/22 #30/6 ?Requested Prescriptions  ?Pending Prescriptions Disp Refills  ?? sertraline (ZOLOFT) 50 MG tablet [Pharmacy Med Name: SERTRALINE HCL 50 MG TABLET] 45 tablet   ?  Sig: TAKE 1/2 TABLET BY MOUTH EVERY DAY  ?  ? Not Delegated - Psychiatry:  Antidepressants - SSRI - sertraline Failed - 03/04/2022  1:08 PM  ?  ?  Failed - This refill cannot be delegated  ?  ?  Passed - AST in normal range and within 360 days  ?  AST  ?Date Value Ref Range Status  ?02/14/2022 31 15 - 41 U/L Final  ?   ?  ?  Passed - ALT in normal range and within 360 days  ?  ALT  ?Date Value Ref Range Status  ?02/14/2022 25 0 - 44 U/L Final  ?   ?  ?  Passed - Completed PHQ-2 or PHQ-9 in the last 360 days  ?  ?  Passed - Valid encounter within last 6 months  ?  Recent Outpatient Visits   ?      ? 3 days ago Abnormal LFTs  ? [place] Ladell Pier, MD  ? 1 month ago Type 2 diabetes mellitus with hypoglycemia without coma, without long-term current use of insulin ([place])  ? [place] Ladell Pier, MD  ? 4 months ago Diabetes mellitus type 2, noninsulin dependent ([place])  ? [place] Ladell Pier, MD  ? 7 months ago Hospital discharge follow-up  ? [place] Ladell Pier, MD  ? 11 months ago Hypertensive kidney disease with chronic kidney disease stage V ([place])  ? [place] Ladell Pier, MD  ?  ?  ? ?  ?  ?  ? ? ?

## 2022-03-05 ENCOUNTER — Other Ambulatory Visit: Payer: Self-pay | Admitting: Pharmacist

## 2022-03-05 MED ORDER — SERTRALINE HCL 25 MG PO TABS: 25.0000 mg | ORAL_TABLET | Freq: Every day | ORAL | 1 refills | Status: DC

## 2022-03-20 DIAGNOSIS — Z419 Encounter for procedure for purposes other than remedying health state, unspecified: Secondary | ICD-10-CM | POA: Diagnosis not present

## 2022-03-26 ENCOUNTER — Other Ambulatory Visit: Payer: Self-pay | Admitting: Internal Medicine

## 2022-03-31 ENCOUNTER — Encounter: Payer: Medicaid Other | Attending: Internal Medicine | Admitting: Registered"

## 2022-03-31 DIAGNOSIS — E440 Moderate protein-calorie malnutrition: Secondary | ICD-10-CM | POA: Insufficient documentation

## 2022-04-19 DIAGNOSIS — Z419 Encounter for procedure for purposes other than remedying health state, unspecified: Secondary | ICD-10-CM | POA: Diagnosis not present

## 2022-04-22 DIAGNOSIS — I129 Hypertensive chronic kidney disease with stage 1 through stage 4 chronic kidney disease, or unspecified chronic kidney disease: Secondary | ICD-10-CM | POA: Diagnosis not present

## 2022-04-22 DIAGNOSIS — E2740 Unspecified adrenocortical insufficiency: Secondary | ICD-10-CM | POA: Diagnosis not present

## 2022-04-22 DIAGNOSIS — N184 Chronic kidney disease, stage 4 (severe): Secondary | ICD-10-CM | POA: Diagnosis not present

## 2022-04-22 DIAGNOSIS — D649 Anemia, unspecified: Secondary | ICD-10-CM | POA: Diagnosis not present

## 2022-04-22 DIAGNOSIS — E1122 Type 2 diabetes mellitus with diabetic chronic kidney disease: Secondary | ICD-10-CM | POA: Diagnosis not present

## 2022-04-22 DIAGNOSIS — I701 Atherosclerosis of renal artery: Secondary | ICD-10-CM | POA: Diagnosis not present

## 2022-04-22 DIAGNOSIS — N2581 Secondary hyperparathyroidism of renal origin: Secondary | ICD-10-CM | POA: Diagnosis not present

## 2022-05-01 ENCOUNTER — Other Ambulatory Visit: Payer: Self-pay | Admitting: Internal Medicine

## 2022-05-20 DIAGNOSIS — Z419 Encounter for procedure for purposes other than remedying health state, unspecified: Secondary | ICD-10-CM | POA: Diagnosis not present

## 2022-06-03 ENCOUNTER — Other Ambulatory Visit: Payer: Self-pay | Admitting: Internal Medicine

## 2022-06-08 ENCOUNTER — Encounter: Payer: Self-pay | Admitting: Internal Medicine

## 2022-06-09 ENCOUNTER — Other Ambulatory Visit: Payer: Self-pay | Admitting: Family Medicine

## 2022-06-09 DIAGNOSIS — M170 Bilateral primary osteoarthritis of knee: Secondary | ICD-10-CM

## 2022-06-09 MED ORDER — TRAMADOL HCL 50 MG PO TABS: 50.0000 mg | ORAL_TABLET | Freq: Two times a day (BID) | ORAL | 0 refills | Status: DC | PRN

## 2022-06-19 DIAGNOSIS — Z419 Encounter for procedure for purposes other than remedying health state, unspecified: Secondary | ICD-10-CM | POA: Diagnosis not present

## 2022-06-28 DIAGNOSIS — H5213 Myopia, bilateral: Secondary | ICD-10-CM | POA: Diagnosis not present

## 2022-07-17 ENCOUNTER — Encounter (HOSPITAL_BASED_OUTPATIENT_CLINIC_OR_DEPARTMENT_OTHER): Payer: Self-pay

## 2022-07-17 ENCOUNTER — Emergency Department (HOSPITAL_BASED_OUTPATIENT_CLINIC_OR_DEPARTMENT_OTHER)
Admission: EM | Admit: 2022-07-17 | Discharge: 2022-07-17 | Disposition: A | Discharge status: Home / Self Care | Payer: Medicaid Other | Attending: Emergency Medicine | Admitting: Emergency Medicine

## 2022-07-17 ENCOUNTER — Other Ambulatory Visit: Payer: Self-pay

## 2022-07-17 DIAGNOSIS — K029 Dental caries, unspecified: Secondary | ICD-10-CM | POA: Diagnosis not present

## 2022-07-17 DIAGNOSIS — Z7982 Long term (current) use of aspirin: Secondary | ICD-10-CM | POA: Diagnosis not present

## 2022-07-17 DIAGNOSIS — K0889 Other specified disorders of teeth and supporting structures: Secondary | ICD-10-CM | POA: Diagnosis present

## 2022-07-17 DIAGNOSIS — Z79899 Other long term (current) drug therapy: Secondary | ICD-10-CM | POA: Diagnosis not present

## 2022-07-17 DIAGNOSIS — N189 Chronic kidney disease, unspecified: Secondary | ICD-10-CM | POA: Diagnosis not present

## 2022-07-17 DIAGNOSIS — I129 Hypertensive chronic kidney disease with stage 1 through stage 4 chronic kidney disease, or unspecified chronic kidney disease: Secondary | ICD-10-CM | POA: Insufficient documentation

## 2022-07-17 DIAGNOSIS — E1122 Type 2 diabetes mellitus with diabetic chronic kidney disease: Secondary | ICD-10-CM | POA: Diagnosis not present

## 2022-07-17 MED ORDER — NAPROXEN 375 MG PO TABS: 375.0000 mg | ORAL_TABLET | Freq: Two times a day (BID) | ORAL | 0 refills | Status: DC

## 2022-07-17 MED ORDER — AMOXICILLIN 500 MG PO CAPS
500.0000 mg | ORAL_CAPSULE | Freq: Once | ORAL | Status: AC
  Administered 2022-07-17: 500 mg via ORAL
  Filled 2022-07-17: qty 1

## 2022-07-17 MED ORDER — IBUPROFEN 400 MG PO TABS
400.0000 mg | ORAL_TABLET | Freq: Once | ORAL | Status: AC
  Administered 2022-07-17: 400 mg via ORAL
  Filled 2022-07-17: qty 1

## 2022-07-17 MED ORDER — AMOXICILLIN 500 MG PO CAPS: 500.0000 mg | ORAL_CAPSULE | Freq: Three times a day (TID) | ORAL | 0 refills | Status: DC

## 2022-07-17 NOTE — Discharge Instructions (Addendum)
Take the antibiotics as prescribed.  Take Tylenol as needed for pain.  Follow-up with a dentist as soon as possible.

## 2022-07-17 NOTE — ED Triage Notes (Signed)
Patient here POV from Home.  Endorses eating a Month ago and having two of her Teeth breaking (Both Upper).   Here today due to recent Pain and Swelling to Same. No Fevers.   NAD noted during Triage. A&OX4. GCS 15. Ambulatory.

## 2022-07-17 NOTE — ED Provider Notes (Signed)
[facility] EMERGENCY DEPT Provider Note   CSN: [id number] Arrival date & time: 07/17/22  1829     History  Chief Complaint  Patient presents with   Dental Pain    [name] is a 61 y.o. female.   Dental Pain Patient has history of hypertension, diabetes, chronic kidney disease who presents with dental pain.  Patient states she has had 2 teeth that broke about a month or 2 ago.  Patient has had increasing pain in the last few days.  She has not been able to see a dentist.  Patient denies any fevers or chills.  No difficulty swallowing     Home Medications Prior to Admission medications   Medication Sig Start Date End Date Taking? Authorizing Provider  amoxicillin (AMOXIL) 500 MG capsule Take 1 capsule (500 mg total) by mouth 3 (three) times daily. 07/17/22  Yes Dorie Rank, MD  naproxen (NAPROSYN) 375 MG tablet Take 1 tablet (375 mg total) by mouth 2 (two) times daily. 07/17/22  Yes Dorie Rank, MD  Accu-Chek Softclix Lancets lancets Use as instructed 11/23/21   Ladell Pier, MD  amLODipine (NORVASC) 10 MG tablet TAKE 1 TABLET (10 MG TOTAL) BY MOUTH DAILY. 10/16/21 10/16/22  Ladell Pier, MD  aspirin 81 MG chewable tablet Chew 81 mg by mouth daily.    [provider]  Blood Glucose Monitoring Suppl (ACCU-CHEK GUIDE) w/Device KIT UAD 11/23/21   Ladell Pier, MD  Calcium Carbonate-Vit D-Min (CALCIUM 1200 PO) Take 1,200 mg by mouth daily.    [provider]  dicyclomine (BENTYL) 20 MG tablet TAKE 1 TABLET (20 MG TOTAL) BY MOUTH DAILY AS NEEDED FOR SPASMS. 05/03/22   Ladell Pier, MD  glucose blood (ACCU-CHEK GUIDE) test strip Use as instructed 11/23/21   Ladell Pier, MD  hydrALAZINE (APRESOLINE) 10 MG tablet Take 1 tablet (10 mg total) by mouth 3 (three) times daily. 10/16/21   Ladell Pier, MD  hydrocortisone (CORTEF) 10 MG tablet Take 3 tablets (30 mg total) by mouth daily. 02/03/22   Renato Shin, MD  isosorbide  mononitrate (IMDUR) 60 MG 24 hr tablet TAKE 1 TABLET BY MOUTH DAILY 10/16/21 10/16/22  Ladell Pier, MD  levothyroxine (SYNTHROID) 175 MCG tablet Take 1 tablet (175 mcg total) by mouth daily before breakfast. Stop the 25 mcg tablet 03/02/22   Ladell Pier, MD  Multiple Vitamin (MULTIVITAMIN WITH MINERALS) TABS tablet Take 1 tablet by mouth daily.    [provider]  omeprazole (PRILOSEC) 40 MG capsule TAKE 1 CAPSULE (40 MG TOTAL) BY MOUTH DAILY. 08/31/21 08/31/22  Ladell Pier, MD  ondansetron (ZOFRAN) 4 MG tablet Take 1 tablet (4 mg total) by mouth daily as needed for nausea or vomiting. 10/27/21   Ladell Pier, MD  ondansetron (ZOFRAN-ODT) 4 MG disintegrating tablet Take 1 tablet (4 mg total) by mouth every 8 (eight) hours as needed for nausea or vomiting. 02/14/22   Carmin Muskrat, MD  polyethylene glycol (MIRALAX / GLYCOLAX) 17 g packet Take 17 g by mouth daily. 05/17/21   Ghimire, Henreitta Leber, MD  sertraline (ZOLOFT) 25 MG tablet Take 1 tablet (25 mg total) by mouth daily. 03/05/22   Ladell Pier, MD  torsemide (DEMADEX) 20 MG tablet TAKE 1 TABLET BY MOUTH DAILY AS NEEDED (FOR WEIGHT GAIN OF 3LBS IN 1 DAY OR 5 LBS IN 2 DAYS.). 06/04/22   Ladell Pier, MD  traMADol (ULTRAM) 50 MG tablet Take 1 tablet (50  mg total) by mouth 2 (two) times daily as needed. 06/09/22   Charlott Rakes, MD  vitamin B-12 (CYANOCOBALAMIN) 1000 MCG tablet Take 1 tablet (1,000 mcg total) by mouth daily. 05/16/21   Ghimire, Henreitta Leber, MD      Allergies    Beef-derived products, Pork-derived products, and Percocet [oxycodone-acetaminophen]    Review of Systems   Review of Systems  Physical Exam Updated Vital Signs BP (!) 172/60 (BP Location: Right Arm)   Pulse 65   Temp 98.3 F (36.8 C) (Temporal)   Resp 16   Ht 1.626 m ($Remove'5\' 4"'VUbxigP$ )   Wt 45.4 kg   SpO2 100%   BMI 17.18 kg/m  Physical Exam Vitals and nursing note reviewed.  Constitutional:      General: She is not in acute  distress.    Appearance: She is well-developed.  HENT:     Head: Normocephalic and atraumatic.     Right Ear: External ear normal.     Left Ear: External ear normal.     Mouth/Throat:     Dentition: Dental caries present.      Comments: Patient with decayed teeth bilateral upper canine, no oropharyngeal edema, no facial edema,  Eyes:     General: No scleral icterus.       Right eye: No discharge.        Left eye: No discharge.     Conjunctiva/sclera: Conjunctivae normal.  Neck:     Trachea: No tracheal deviation.  Cardiovascular:     Rate and Rhythm: Normal rate.  Pulmonary:     Effort: Pulmonary effort is normal. No respiratory distress.     Breath sounds: No stridor.  Abdominal:     General: There is no distension.  Musculoskeletal:        General: No swelling or deformity.     Cervical back: Neck supple.  Skin:    General: Skin is warm and dry.     Findings: No rash.  Neurological:     Mental Status: She is alert.     Cranial Nerves: Cranial nerve deficit: no gross deficits.     ED Results / Procedures / Treatments   Labs (all labs ordered are listed, but only abnormal results are displayed) Labs Reviewed - No data to display  EKG None  Radiology No results found.  Procedures Procedures    Medications Ordered in ED Medications  amoxicillin (AMOXIL) capsule 500 mg (has no administration in time range)  ibuprofen (ADVIL) tablet 400 mg (has no administration in time range)    ED Course/ Medical Decision Making/ A&P                           Medical Decision Making Risk Prescription drug management.   Patient has obvious dental caries but no signs of any facial swelling.  No findings to suggest severe abscess or deep space infection.  We will start the patient on a course of antibiotics.  Patient does have chronic kidney disease so we will have her take Tylenol for pain.  Amoxicillin prescribed.  Outpatient referral to dentistry        Final  Clinical Impression(s) / ED Diagnoses Final diagnoses:  Dental caries    Rx / DC Orders ED Discharge Orders          Ordered    amoxicillin (AMOXIL) 500 MG capsule  3 times daily        07/17/22 2210  Dorie Rank, MD 07/17/22 2215

## 2022-07-18 ENCOUNTER — Other Ambulatory Visit: Payer: Self-pay | Admitting: Internal Medicine

## 2022-07-18 ENCOUNTER — Encounter: Payer: Self-pay | Admitting: Internal Medicine

## 2022-07-18 ENCOUNTER — Other Ambulatory Visit: Payer: Self-pay | Admitting: Family Medicine

## 2022-07-18 DIAGNOSIS — K219 Gastro-esophageal reflux disease without esophagitis: Secondary | ICD-10-CM

## 2022-07-18 DIAGNOSIS — M170 Bilateral primary osteoarthritis of knee: Secondary | ICD-10-CM

## 2022-07-18 MED ORDER — OMEPRAZOLE 40 MG PO CPDR: DELAYED_RELEASE_CAPSULE | Freq: Every day | ORAL | 3 refills | Status: DC

## 2022-07-18 MED ORDER — TRAMADOL HCL 50 MG PO TABS: 50.0000 mg | ORAL_TABLET | Freq: Two times a day (BID) | ORAL | 0 refills | Status: DC | PRN

## 2022-07-20 DIAGNOSIS — Z419 Encounter for procedure for purposes other than remedying health state, unspecified: Secondary | ICD-10-CM | POA: Diagnosis not present

## 2022-07-27 DIAGNOSIS — N184 Chronic kidney disease, stage 4 (severe): Secondary | ICD-10-CM | POA: Diagnosis not present

## 2022-07-27 DIAGNOSIS — N2581 Secondary hyperparathyroidism of renal origin: Secondary | ICD-10-CM | POA: Diagnosis not present

## 2022-07-27 DIAGNOSIS — E2740 Unspecified adrenocortical insufficiency: Secondary | ICD-10-CM | POA: Diagnosis not present

## 2022-07-27 DIAGNOSIS — D649 Anemia, unspecified: Secondary | ICD-10-CM | POA: Diagnosis not present

## 2022-07-27 DIAGNOSIS — E1122 Type 2 diabetes mellitus with diabetic chronic kidney disease: Secondary | ICD-10-CM | POA: Diagnosis not present

## 2022-07-27 DIAGNOSIS — I129 Hypertensive chronic kidney disease with stage 1 through stage 4 chronic kidney disease, or unspecified chronic kidney disease: Secondary | ICD-10-CM | POA: Diagnosis not present

## 2022-07-27 DIAGNOSIS — I701 Atherosclerosis of renal artery: Secondary | ICD-10-CM | POA: Diagnosis not present

## 2022-07-30 ENCOUNTER — Encounter (HOSPITAL_COMMUNITY): Payer: Self-pay

## 2022-07-30 ENCOUNTER — Other Ambulatory Visit: Payer: Self-pay

## 2022-07-30 ENCOUNTER — Emergency Department (HOSPITAL_COMMUNITY)
Admission: EM | Admit: 2022-07-30 | Discharge: 2022-07-30 | Disposition: A | Discharge status: Home / Self Care | Payer: Medicaid Other | Attending: Emergency Medicine | Admitting: Emergency Medicine

## 2022-07-30 DIAGNOSIS — Z79899 Other long term (current) drug therapy: Secondary | ICD-10-CM | POA: Diagnosis not present

## 2022-07-30 DIAGNOSIS — K029 Dental caries, unspecified: Secondary | ICD-10-CM | POA: Insufficient documentation

## 2022-07-30 DIAGNOSIS — K0889 Other specified disorders of teeth and supporting structures: Secondary | ICD-10-CM | POA: Diagnosis present

## 2022-07-30 DIAGNOSIS — I509 Heart failure, unspecified: Secondary | ICD-10-CM | POA: Insufficient documentation

## 2022-07-30 DIAGNOSIS — N184 Chronic kidney disease, stage 4 (severe): Secondary | ICD-10-CM | POA: Insufficient documentation

## 2022-07-30 DIAGNOSIS — Z7982 Long term (current) use of aspirin: Secondary | ICD-10-CM | POA: Diagnosis not present

## 2022-07-30 DIAGNOSIS — E1122 Type 2 diabetes mellitus with diabetic chronic kidney disease: Secondary | ICD-10-CM | POA: Insufficient documentation

## 2022-07-30 DIAGNOSIS — I130 Hypertensive heart and chronic kidney disease with heart failure and stage 1 through stage 4 chronic kidney disease, or unspecified chronic kidney disease: Secondary | ICD-10-CM | POA: Diagnosis not present

## 2022-07-30 DIAGNOSIS — K047 Periapical abscess without sinus: Secondary | ICD-10-CM | POA: Diagnosis not present

## 2022-07-30 MED ORDER — IBUPROFEN 200 MG PO TABS
600.0000 mg | ORAL_TABLET | Freq: Once | ORAL | Status: AC
  Administered 2022-07-30: 600 mg via ORAL
  Filled 2022-07-30: qty 3

## 2022-07-30 MED ORDER — AMOXICILLIN-POT CLAVULANATE 875-125 MG PO TABS: 1.0000 | ORAL_TABLET | Freq: Two times a day (BID) | ORAL | 0 refills | Status: DC

## 2022-07-30 MED ORDER — AMOXICILLIN-POT CLAVULANATE 875-125 MG PO TABS
1.0000 | ORAL_TABLET | Freq: Once | ORAL | Status: AC
Start: 2022-07-30 — End: 2022-07-30
  Administered 2022-07-30: 1 via ORAL
  Filled 2022-07-30: qty 1

## 2022-07-30 NOTE — Discharge Instructions (Signed)
Your symptoms are most likely secondary to infection from known cracked tooth.  We will put you on antibiotics to take twice a day until you are able to see a dentist.  Continue take Tylenol over-the-counter for pain.  Please avoid NSAIDs as this could worsen your kidney function.  I sent an ambulatory referral in for the dentist so please call the number as soon as possible.  Please do not hesitate to return to the emergency department if the worrisome signs symptoms we discussed become apparent.

## 2022-07-30 NOTE — ED Provider Notes (Signed)
[facility] DEPT Provider Note   CSN: [id number] Arrival date & time: 07/30/22  1432     History  Chief Complaint  Patient presents with   Facial Swelling    [name] is a 61 y.o. female.  HPI  61 year old female presents emergency department with complaints of left upper tooth pain.  She states the pain and associated swelling became apparent yesterday.  She was recently seen in the emergency department on 07/17/2022 for the same presentation.  She states that this presentation is very similar to her previous 1.  She responded well to antibiotic therapy but missed the time window for the dental referral.  She has an upcoming appointment at the end of this month for her dentist but is requesting another dental referral to hopefully get in sooner.  She denies fever, chills, night sweats, difficulty breathing, difficulty swallowing, swelling beneath the tongue, swelling in the submandibular region.  Past medical history significant for CKD stage IV, hypertension, diabetes mellitus, CHF, Addison's disease,  Home Medications Prior to Admission medications   Medication Sig Start Date End Date Taking? Authorizing Provider  amoxicillin-clavulanate (AUGMENTIN) 875-125 MG tablet Take 1 tablet by mouth every 12 (twelve) hours. 07/30/22  Yes Dion Saucier A, PA  Accu-Chek Softclix Lancets lancets Use as instructed 11/23/21   Ladell Pier, MD  amLODipine (NORVASC) 10 MG tablet TAKE 1 TABLET (10 MG TOTAL) BY MOUTH DAILY. 10/16/21 10/16/22  Ladell Pier, MD  aspirin 81 MG chewable tablet Chew 81 mg by mouth daily.    [provider]  Blood Glucose Monitoring Suppl (ACCU-CHEK GUIDE) w/Device KIT UAD 11/23/21   Ladell Pier, MD  Calcium Carbonate-Vit D-Min (CALCIUM 1200 PO) Take 1,200 mg by mouth daily.    [provider]  dicyclomine (BENTYL) 20 MG tablet TAKE 1 TABLET (20 MG TOTAL) BY MOUTH DAILY AS NEEDED FOR SPASMS. 05/03/22    Ladell Pier, MD  glucose blood (ACCU-CHEK GUIDE) test strip Use as instructed 11/23/21   Ladell Pier, MD  hydrALAZINE (APRESOLINE) 10 MG tablet Take 1 tablet (10 mg total) by mouth 3 (three) times daily. 10/16/21   Ladell Pier, MD  hydrocortisone (CORTEF) 10 MG tablet Take 3 tablets (30 mg total) by mouth daily. 02/03/22   Renato Shin, MD  isosorbide mononitrate (IMDUR) 60 MG 24 hr tablet TAKE 1 TABLET BY MOUTH DAILY 10/16/21 10/16/22  Ladell Pier, MD  levothyroxine (SYNTHROID) 175 MCG tablet Take 1 tablet (175 mcg total) by mouth daily before breakfast. Stop the 25 mcg tablet 03/02/22   Ladell Pier, MD  Multiple Vitamin (MULTIVITAMIN WITH MINERALS) TABS tablet Take 1 tablet by mouth daily.    [provider]  omeprazole (PRILOSEC) 40 MG capsule TAKE 1 CAPSULE (40 MG TOTAL) BY MOUTH DAILY. 07/18/22 07/18/23  Ladell Pier, MD  ondansetron (ZOFRAN) 4 MG tablet Take 1 tablet (4 mg total) by mouth daily as needed for nausea or vomiting. 10/27/21   Ladell Pier, MD  ondansetron (ZOFRAN-ODT) 4 MG disintegrating tablet Take 1 tablet (4 mg total) by mouth every 8 (eight) hours as needed for nausea or vomiting. 02/14/22   Carmin Muskrat, MD  polyethylene glycol (MIRALAX / GLYCOLAX) 17 g packet Take 17 g by mouth daily. 05/17/21   Ghimire, Henreitta Leber, MD  sertraline (ZOLOFT) 25 MG tablet Take 1 tablet (25 mg total) by mouth daily. 03/05/22   Ladell Pier, MD  torsemide (DEMADEX) 20 MG tablet TAKE 1  TABLET BY MOUTH DAILY AS NEEDED (FOR WEIGHT GAIN OF 3LBS IN 1 DAY OR 5 LBS IN 2 DAYS.). 06/04/22   Ladell Pier, MD  traMADol (ULTRAM) 50 MG tablet Take 1 tablet (50 mg total) by mouth 2 (two) times daily as needed. 07/18/22   Ladell Pier, MD  vitamin B-12 (CYANOCOBALAMIN) 1000 MCG tablet Take 1 tablet (1,000 mcg total) by mouth daily. 05/16/21   Ghimire, Henreitta Leber, MD      Allergies    Beef-derived products, Pork-derived products, and Percocet  [oxycodone-acetaminophen]    Review of Systems   Review of Systems  All other systems reviewed and are negative.   Physical Exam Updated Vital Signs BP (!) 185/51 (BP Location: Left Arm)   Pulse 68   Temp 98.6 F (37 C) (Oral)   Resp 18   Ht _0  (1.626 m)   Wt 45.4 kg   SpO2 99%   BMI 17.18 kg/m  Physical Exam Vitals and nursing note reviewed.  Constitutional:      General: She is not in acute distress.    Appearance: She is well-developed.  HENT:     Head: Normocephalic and atraumatic.     Mouth/Throat:     Dentition: Dental caries present.      Comments: 3 obvious fractured teeth indicated above.  Swelling and minimal erythema noted on incisor indicated above.  External swelling noted in same area.  No obvious abscess formation in affected area.  Tonsils 1+ bilaterally with no obvious exudate.  No posterior pharyngeal erythema noted.  Uvula midline and rises symmetrically with phonation.  No edema noted beneath the tongue.  No edema/swelling noted in the submandibular region.  Patient tolerating oral secretions well without difficulty.  She was able to swallow liquid while in the room without difficulty. Eyes:     Conjunctiva/sclera: Conjunctivae normal.  Cardiovascular:     Rate and Rhythm: Normal rate and regular rhythm.     Heart sounds: No murmur heard. Pulmonary:     Effort: Pulmonary effort is normal. No respiratory distress.     Breath sounds: Normal breath sounds.  Abdominal:     Palpations: Abdomen is soft.     Tenderness: There is no abdominal tenderness.  Musculoskeletal:        General: No swelling.     Cervical back: Neck supple.  Skin:    General: Skin is warm and dry.     Capillary Refill: Capillary refill takes less than 2 seconds.  Neurological:     Mental Status: She is alert.  Psychiatric:        Mood and Affect: Mood normal.     ED Results / Procedures / Treatments   Labs (all labs ordered are listed, but only abnormal results are  displayed) Labs Reviewed - No data to display  EKG None  Radiology No results found.  Procedures Procedures    Medications Ordered in ED Medications  ibuprofen (ADVIL) tablet 600 mg (600 mg Oral Given 07/30/22 1628)  amoxicillin-clavulanate (AUGMENTIN) 875-125 MG per tablet 1 tablet (1 tablet Oral Given 07/30/22 1628)    ED Course/ Medical Decision Making/ A&P                           Medical Decision Making Risk OTC drugs. Prescription drug management.   This patient presents to the ED for concern of dental carry, this involves an extensive number of treatment options, and is a complaint  that carries with it a high risk of complications and morbidity.  The differential diagnosis includes dental carry, periapical abscess, peritonsillar abscess, pharyngitis, acute necrotizing ulcerative gingivitis, Ludwig angina, anaphylaxis   Co morbidities that complicate the patient evaluation  See above   Additional history obtained:  Additional history obtained from EMR External records from outside source obtained and reviewed including recent antibiotic therapy given for prior infection from late July.   Lab Tests:  N/a   Imaging Studies ordered:  N/a   Cardiac Monitoring: / EKG:  The patient was maintained on a cardiac monitor.  I personally viewed and interpreted the cardiac monitored which showed an underlying rhythm of: Sinus rhythm   Consultations Obtained:  N/a   Problem List / ED Course / Critical interventions / Medication management  I ordered medication including Augmentin as well as ibuprofen for antibiotic therapy and pain  f  Reevaluation of the patient after these medicines showed that the patient improved I have reviewed the patients home medicines and have made adjustments as needed   Social Determinants of Health:  Denies tobacco, illicit drug use   Test / Admission - Considered:  Dental pain Vitals signs significant for hypertension  with a blood pressure of 185/51.  Recommend close follow-up with PCP as well as continuation of blood pressure medication at home.  Elevation could be secondary to patient currently in pain.. Otherwise within normal range and stable throughout visit. Laboratory and imaging studies considered but deemed necessary due to lack of significant findings concerning for concerning pathology such as Ludwig angina, peritonsillar abscess, large periapical abscess.  Patient afebrile and tolerating foods and liquids orally so outpatient therapy with pain medication as well as oral antibiotics recommended.  Source control through removal of infected teeth discussed at length with patient and daughter.  2-week antibiotic course of Augmentin prescribed until patient is able to follow-up with a dentist.  Ambulatory referral for dentist also placed to see if she can get in sooner than her already made appointment later this month.  Treatment plan was discussed at length with patient and daughter and they acknowledged understanding were agreeable to said plan. Worrisome signs and symptoms were discussed with the patient, and the patient acknowledged understanding to return to the ED if noticed. Patient was stable upon discharge.         Final Clinical Impression(s) / ED Diagnoses Final diagnoses:  Dental infection    Rx / DC Orders ED Discharge Orders          Ordered    amoxicillin-clavulanate (AUGMENTIN) 875-125 MG tablet  Every 12 hours        07/30/22 1610    Ambulatory referral to Dentistry        07/30/22 [place], [place], [place] 07/30/22 1655    Lajean Saver, MD 07/30/22 2310

## 2022-07-30 NOTE — ED Triage Notes (Signed)
Patient reports that she has a left upper tooth that is broken and began having facial swelling that started yesterday. Patient denies difficulty swallowing, breathing, or chewing

## 2022-08-12 ENCOUNTER — Encounter: Payer: Self-pay | Admitting: Internal Medicine

## 2022-08-12 ENCOUNTER — Other Ambulatory Visit: Payer: Self-pay | Admitting: Internal Medicine

## 2022-08-12 DIAGNOSIS — M170 Bilateral primary osteoarthritis of knee: Secondary | ICD-10-CM

## 2022-08-12 MED ORDER — TRAMADOL HCL 50 MG PO TABS: 50.0000 mg | ORAL_TABLET | Freq: Two times a day (BID) | ORAL | 0 refills | Status: DC | PRN

## 2022-08-19 ENCOUNTER — Other Ambulatory Visit: Payer: Self-pay | Admitting: Internal Medicine

## 2022-08-19 DIAGNOSIS — E038 Other specified hypothyroidism: Secondary | ICD-10-CM

## 2022-08-19 DIAGNOSIS — K219 Gastro-esophageal reflux disease without esophagitis: Secondary | ICD-10-CM

## 2022-08-20 DIAGNOSIS — Z419 Encounter for procedure for purposes other than remedying health state, unspecified: Secondary | ICD-10-CM | POA: Diagnosis not present

## 2022-08-26 ENCOUNTER — Encounter: Payer: Self-pay | Admitting: Internal Medicine

## 2022-08-26 ENCOUNTER — Other Ambulatory Visit: Payer: Self-pay | Admitting: Internal Medicine

## 2022-08-26 MED ORDER — SERTRALINE HCL 25 MG PO TABS: 25.0000 mg | ORAL_TABLET | Freq: Every day | ORAL | 1 refills | Status: DC

## 2022-08-30 ENCOUNTER — Encounter: Payer: Self-pay | Admitting: Internal Medicine

## 2022-09-19 DIAGNOSIS — Z419 Encounter for procedure for purposes other than remedying health state, unspecified: Secondary | ICD-10-CM | POA: Diagnosis not present

## 2022-10-05 ENCOUNTER — Encounter: Payer: Self-pay | Admitting: Internal Medicine

## 2022-10-05 ENCOUNTER — Other Ambulatory Visit: Payer: Self-pay | Admitting: Internal Medicine

## 2022-10-05 DIAGNOSIS — M170 Bilateral primary osteoarthritis of knee: Secondary | ICD-10-CM

## 2022-10-05 MED ORDER — TRAMADOL HCL 50 MG PO TABS: 50.0000 mg | ORAL_TABLET | Freq: Two times a day (BID) | ORAL | 0 refills | Status: DC | PRN

## 2022-10-07 ENCOUNTER — Other Ambulatory Visit: Payer: Self-pay | Admitting: Internal Medicine

## 2022-10-07 NOTE — Telephone Encounter (Signed)
Refilled last on 08/26/22 #90/1 refill, refusing this request.   Requested Prescriptions  Refused Prescriptions Disp Refills  . sertraline (ZOLOFT) 50 MG tablet [Pharmacy Med Name: SERTRALINE HCL 50 MG TABLET] 45 tablet 0    Sig: TAKE 1/2 TABLET BY MOUTH EVERY DAY     Psychiatry:  Antidepressants - SSRI - sertraline Failed - 10/07/2022  3:47 PM      Failed - Valid encounter within last 6 months    Recent Outpatient Visits          7 months ago Abnormal LFTs   [place], Neoma Laming B, MD   8 months ago Type 2 diabetes mellitus with hypoglycemia without coma, without long-term current use of insulin [facility])   [place] Karle Plumber B, MD   11 months ago Diabetes mellitus type 2, noninsulin dependent [facility])   [place] [facility] Ladell Pier, MD   1 year ago Hospital discharge follow-up   [place] Ladell Pier, MD   1 year ago Hypertensive kidney disease with chronic kidney disease stage V [facility])   [place] Ladell Pier, MD             Passed - AST in normal range and within 360 days    AST  Date Value Ref Range Status  02/14/2022 31 15 - 41 U/L Final         Passed - ALT in normal range and within 360 days    ALT  Date Value Ref Range Status  02/14/2022 25 0 - 44 U/L Final         Passed - Completed PHQ-2 or PHQ-9 in the last 360 days

## 2022-10-19 ENCOUNTER — Other Ambulatory Visit: Payer: Self-pay | Admitting: Internal Medicine

## 2022-10-19 DIAGNOSIS — E038 Other specified hypothyroidism: Secondary | ICD-10-CM

## 2022-10-20 DIAGNOSIS — Z419 Encounter for procedure for purposes other than remedying health state, unspecified: Secondary | ICD-10-CM | POA: Diagnosis not present

## 2022-10-31 ENCOUNTER — Other Ambulatory Visit: Payer: Self-pay | Admitting: Internal Medicine

## 2022-10-31 DIAGNOSIS — K219 Gastro-esophageal reflux disease without esophagitis: Secondary | ICD-10-CM

## 2022-11-01 NOTE — Telephone Encounter (Signed)
Requested medications are due for refill today.  yes  Requested medications are on the active medications list.  yes  Last refill. 08/19/2022 #30 0 rf  Future visit scheduled.   no  Notes to clinic.    Pharmacy comment: REQUEST FOR 90 DAYS PRESCRIPTION. DX Code Needed.     Requested Prescriptions  Pending Prescriptions Disp Refills   omeprazole (PRILOSEC) 40 MG capsule [Pharmacy Med Name: OMEPRAZOLE DR 40 MG CAPSULE] 90 capsule 1    Sig: Take 1 capsule (40 mg total) by mouth daily.     Gastroenterology: Proton Pump Inhibitors Passed - 10/31/2022  9:32 AM      Passed - Valid encounter within last 12 months    Recent Outpatient Visits           8 months ago Abnormal LFTs   [place] Karle Plumber B, MD   9 months ago Type 2 diabetes mellitus with hypoglycemia without coma, without long-term current use of insulin [facility])   [place] Ladell Pier, MD   1 year ago Diabetes mellitus type 2, noninsulin dependent [facility])   [place] [facility] Ladell Pier, MD   1 year ago Hospital discharge follow-up   [place] Ladell Pier, MD   1 year ago Hypertensive kidney disease with chronic kidney disease stage V [facility])   [place] [facility] Ladell Pier, MD

## 2022-11-09 ENCOUNTER — Other Ambulatory Visit: Payer: Self-pay | Admitting: Internal Medicine

## 2022-11-09 DIAGNOSIS — K219 Gastro-esophageal reflux disease without esophagitis: Secondary | ICD-10-CM

## 2022-11-09 DIAGNOSIS — E038 Other specified hypothyroidism: Secondary | ICD-10-CM

## 2022-11-17 ENCOUNTER — Other Ambulatory Visit: Payer: Self-pay | Admitting: Internal Medicine

## 2022-11-19 DIAGNOSIS — Z419 Encounter for procedure for purposes other than remedying health state, unspecified: Secondary | ICD-10-CM | POA: Diagnosis not present

## 2022-12-09 ENCOUNTER — Other Ambulatory Visit: Payer: Self-pay | Admitting: Internal Medicine

## 2022-12-09 DIAGNOSIS — K219 Gastro-esophageal reflux disease without esophagitis: Secondary | ICD-10-CM

## 2022-12-09 DIAGNOSIS — E038 Other specified hypothyroidism: Secondary | ICD-10-CM

## 2022-12-16 ENCOUNTER — Ambulatory Visit: Payer: Medicaid Other | Admitting: Physician Assistant

## 2022-12-17 ENCOUNTER — Ambulatory Visit: Payer: Medicaid Other | Admitting: Internal Medicine

## 2022-12-20 DIAGNOSIS — Z419 Encounter for procedure for purposes other than remedying health state, unspecified: Secondary | ICD-10-CM | POA: Diagnosis not present

## 2023-01-02 ENCOUNTER — Other Ambulatory Visit: Payer: Self-pay | Admitting: Internal Medicine

## 2023-01-02 DIAGNOSIS — K219 Gastro-esophageal reflux disease without esophagitis: Secondary | ICD-10-CM

## 2023-01-02 DIAGNOSIS — E038 Other specified hypothyroidism: Secondary | ICD-10-CM

## 2023-01-06 ENCOUNTER — Other Ambulatory Visit: Payer: Self-pay | Admitting: Family Medicine

## 2023-01-06 ENCOUNTER — Other Ambulatory Visit: Payer: Self-pay | Admitting: Internal Medicine

## 2023-01-06 DIAGNOSIS — E2740 Unspecified adrenocortical insufficiency: Secondary | ICD-10-CM | POA: Diagnosis not present

## 2023-01-06 DIAGNOSIS — I129 Hypertensive chronic kidney disease with stage 1 through stage 4 chronic kidney disease, or unspecified chronic kidney disease: Secondary | ICD-10-CM

## 2023-01-06 DIAGNOSIS — N184 Chronic kidney disease, stage 4 (severe): Secondary | ICD-10-CM | POA: Diagnosis not present

## 2023-01-06 DIAGNOSIS — E119 Type 2 diabetes mellitus without complications: Secondary | ICD-10-CM

## 2023-01-06 DIAGNOSIS — N2581 Secondary hyperparathyroidism of renal origin: Secondary | ICD-10-CM | POA: Diagnosis not present

## 2023-01-06 DIAGNOSIS — I701 Atherosclerosis of renal artery: Secondary | ICD-10-CM | POA: Diagnosis not present

## 2023-01-06 DIAGNOSIS — K219 Gastro-esophageal reflux disease without esophagitis: Secondary | ICD-10-CM

## 2023-01-06 DIAGNOSIS — E038 Other specified hypothyroidism: Secondary | ICD-10-CM

## 2023-01-06 DIAGNOSIS — D649 Anemia, unspecified: Secondary | ICD-10-CM | POA: Diagnosis not present

## 2023-01-06 DIAGNOSIS — E1122 Type 2 diabetes mellitus with diabetic chronic kidney disease: Secondary | ICD-10-CM | POA: Diagnosis not present

## 2023-01-06 NOTE — Telephone Encounter (Signed)
Requested medication (s) are due for refill today:yes  Requested medication (s) are on the active medication list: yes    Last refill: Torsemide 06/04/22  #90  1 refill    Amlodipine 10/16/21  #30  11 refills     Accu guide strips (Expired)  Future visit scheduled yes 01/12/23  Notes to clinic:Pt was to have F/U appt 7/23    Has appt 01/12/23. Please review Thank you.  Requested Prescriptions  Pending Prescriptions Disp Refills   torsemide (DEMADEX) 20 MG tablet [Pharmacy Med Name: TORSEMIDE 20 MG TABLET] 90 tablet 1    Sig: TAKE 1 TABLET BY MOUTH DAILY AS NEEDED (FOR WEIGHT GAIN OF 3LBS IN 1 DAY OR 5 LBS IN 2 DAYS.).     Cardiovascular:  Diuretics - Loop Failed - 01/06/2023  9:13 AM      Failed - K in normal range and within 180 days    Potassium  Date Value Ref Range Status  02/14/2022 4.0 3.5 - 5.1 mmol/L Final         Failed - Ca in normal range and within 180 days    Calcium  Date Value Ref Range Status  02/14/2022 8.5 (L) 8.9 - 10.3 mg/dL Final   Calcium, Ion  Date Value Ref Range Status  06/12/2011 1.22 1.12 - 1.32 mmol/L Final         Failed - Na in normal range and within 180 days    Sodium  Date Value Ref Range Status  02/14/2022 133 (L) 135 - 145 mmol/L Final  01/18/2022 139 134 - 144 mmol/L Final         Failed - Cr in normal range and within 180 days    Creatinine  Date Value Ref Range Status  07/14/2021 86.1 20.0 - 300.0 mg/dL Final   Creatinine, Ser  Date Value Ref Range Status  02/14/2022 1.59 (H) 0.44 - 1.00 mg/dL Final         Failed - Cl in normal range and within 180 days    Chloride  Date Value Ref Range Status  02/14/2022 103 98 - 111 mmol/L Final         Failed - Mg Level in normal range and within 180 days    Magnesium  Date Value Ref Range Status  05/16/2021 2.3 1.7 - 2.4 mg/dL Final    Comment:    Performed at [facility], [place] [address] [place], [place] [address]         Failed - Last BP in normal range    BP Readings  from Last 1 Encounters:  07/30/22 (!) 185/51         Failed - Valid encounter within last 6 months    Recent Outpatient Visits           10 months ago Abnormal LFTs   [place], Neoma Laming B, MD   11 months ago Type 2 diabetes mellitus with hypoglycemia without coma, without long-term current use of insulin [facility])   [place], Deborah B, MD   1 year ago Diabetes mellitus type 2, noninsulin dependent [facility])   [place] Ladell Pier, MD   1 year ago Hospital discharge follow-up   [place], MD   1 year ago Hypertensive kidney disease with chronic kidney disease stage V [facility])   [place],  Dalbert Batman, MD       Future Appointments             In 6 days [place]             amLODipine (NORVASC) 10 MG tablet [Pharmacy Med Name: AMLODIPINE BESYLATE 10 MG TAB] 30 tablet 11    Sig: TAKE 1 TABLET BY MOUTH EVERY DAY     Cardiovascular: Calcium Channel Blockers 2 Failed - 01/06/2023  9:13 AM      Failed - Last BP in normal range    BP Readings from Last 1 Encounters:  07/30/22 (!) 185/51         Failed - Valid encounter within last 6 months    Recent Outpatient Visits           10 months ago Abnormal LFTs   [place], Neoma Laming B, MD   11 months ago Type 2 diabetes mellitus with hypoglycemia without coma, without long-term current use of insulin ([place])   [place], Deborah B, MD   1 year ago Diabetes mellitus type 2, noninsulin dependent ([place])   [place], Deborah B, MD   1 year ago Hospital discharge follow-up   [place], Deborah B, MD   1 year ago Hypertensive  kidney disease with chronic kidney disease stage V [facility])   [place], MD       Future Appointments             In 6 days [place] Donovan, PA-C [place] - Last Heart Rate in normal range    Pulse Readings from Last 1 Encounters:  07/30/22 68          [place] test strip [Pharmacy Med Name: [place] TEST STRIP] 100 strip 12    Sig: Use as instructed     Endocrinology: Diabetes - Testing Supplies Passed - 01/06/2023  9:13 AM      Passed - Valid encounter within last 12 months    Recent Outpatient Visits           10 months ago Abnormal LFTs   [place] Karle Plumber B, MD   11 months ago Type 2 diabetes mellitus with hypoglycemia without coma, without long-term current use of insulin ([place])   [place], Deborah B, MD   1 year ago Diabetes mellitus type 2, noninsulin dependent [facility])   [place], Deborah B, MD   1 year ago Hospital discharge follow-up   [place], Deborah B, MD   1 year ago Hypertensive kidney disease with chronic kidney disease stage V [facility])   [place], MD       Future Appointments             In 6 days There[place] Solo, [place]

## 2023-01-12 ENCOUNTER — Ambulatory Visit: Payer: Medicaid Other | Attending: Internal Medicine | Admitting: Physician Assistant

## 2023-01-12 VITALS — BP 200/80 | HR 74 | Ht 64.0 in | Wt 118.8 lb

## 2023-01-12 DIAGNOSIS — D509 Iron deficiency anemia, unspecified: Secondary | ICD-10-CM

## 2023-01-12 DIAGNOSIS — Z23 Encounter for immunization: Secondary | ICD-10-CM | POA: Diagnosis not present

## 2023-01-12 DIAGNOSIS — E559 Vitamin D deficiency, unspecified: Secondary | ICD-10-CM

## 2023-01-12 DIAGNOSIS — I5032 Chronic diastolic (congestive) heart failure: Secondary | ICD-10-CM

## 2023-01-12 DIAGNOSIS — M170 Bilateral primary osteoarthritis of knee: Secondary | ICD-10-CM

## 2023-01-12 DIAGNOSIS — N184 Chronic kidney disease, stage 4 (severe): Secondary | ICD-10-CM

## 2023-01-12 DIAGNOSIS — R7303 Prediabetes: Secondary | ICD-10-CM

## 2023-01-12 DIAGNOSIS — K219 Gastro-esophageal reflux disease without esophagitis: Secondary | ICD-10-CM

## 2023-01-12 DIAGNOSIS — I10 Essential (primary) hypertension: Secondary | ICD-10-CM

## 2023-01-12 DIAGNOSIS — I129 Hypertensive chronic kidney disease with stage 1 through stage 4 chronic kidney disease, or unspecified chronic kidney disease: Secondary | ICD-10-CM | POA: Diagnosis not present

## 2023-01-12 DIAGNOSIS — E039 Hypothyroidism, unspecified: Secondary | ICD-10-CM | POA: Diagnosis not present

## 2023-01-12 DIAGNOSIS — R5383 Other fatigue: Secondary | ICD-10-CM

## 2023-01-12 MED ORDER — ISOSORBIDE MONONITRATE ER 60 MG PO TB24: ORAL_TABLET | Freq: Every day | ORAL | 1 refills | Status: DC

## 2023-01-12 MED ORDER — SERTRALINE HCL 25 MG PO TABS: 25.0000 mg | ORAL_TABLET | Freq: Every day | ORAL | 1 refills | Status: DC

## 2023-01-12 MED ORDER — HYDRALAZINE HCL 10 MG PO TABS: 15.0000 mg | ORAL_TABLET | Freq: Three times a day (TID) | ORAL | 1 refills | Status: DC

## 2023-01-12 MED ORDER — AMLODIPINE BESYLATE 10 MG PO TABS: ORAL_TABLET | Freq: Every day | ORAL | 1 refills | Status: DC

## 2023-01-12 MED ORDER — TRAMADOL HCL 50 MG PO TABS: 50.0000 mg | ORAL_TABLET | Freq: Two times a day (BID) | ORAL | 0 refills | Status: DC | PRN

## 2023-01-12 MED ORDER — OMEPRAZOLE 40 MG PO CPDR: 40.0000 mg | DELAYED_RELEASE_CAPSULE | Freq: Every day | ORAL | 1 refills | Status: DC

## 2023-01-12 NOTE — Progress Notes (Signed)
Patient ID: [name], female   DOB: [birth year], 62 y.o.   MRN: [id number]     [name], is a 62 y.o. female  [id number]  [id number]  DOB - 08/22/1961  Chief Complaint  Patient presents with   Medication Refill   Hypertension   Fatigue       Subjective:   [name] is a 62 y.o. female here today for BP follow up.  Bp at home:  ~159-179/70-80.  Just seen at [facility] recently(<1 week ago) and they told her to increase hydralazine from 10mg  tid to 15mg  tid..  she has only been taking the higher dose a couple of days.  She has an appt with endocrine tomorrow.  She does say she has been feeling more fatigued lately.  H/o vitamin D deficiency and wants to have that checked.  She does have a BP cuff at home but does not bring any individual readings or the BP cuff in.  She did provide the range above noted.  Denies CP/SOB/dizziness/HA.  No vision changes.  Her daughter is here with her.  She has cardiology follow up in March.    No problems updated.  ALLERGIES: Allergies  Allergen Reactions   Beef-Derived Products     RELIGOUS  REASONS   Pork-Derived Products     RELIGIOUS REASONS   Percocet [Oxycodone-Acetaminophen] Rash    PAST MEDICAL HISTORY: Past Medical History:  Diagnosis Date   Adrenal insufficiency, primary ([place])    Anemia 2018   Hgb 6.8 early 11/2020.  PRBC x 1.  06/2017 PRBC x 1.     Cerebral ventriculomegaly    CHF (congestive heart failure) ([place]) 05/2693   Diastolic   CKD (chronic kidney disease) stage 4, GFR 15-29 ml/min ([place]) 04/2021   Stage 4 as of late May 2022   Diabetes mellitus without complication ([place])    Hypertension    Hypothyroidism     MEDICATIONS AT HOME: Prior to Admission medications   Medication Sig Start Date End Date Taking? Authorizing Provider  Accu-Chek Softclix Lancets lancets Use as instructed 11/23/21  Yes Ladell Pier, MD  aspirin 81 MG chewable tablet Chew 81 mg by mouth daily.   Yes [provider]  Blood Glucose Monitoring Suppl (ACCU-CHEK GUIDE) w/Device KIT UAD 11/23/21  Yes Ladell Pier, MD  Calcium Carbonate-Vit D-Min (CALCIUM 1200 PO) Take 1,200 mg by mouth daily.   Yes [provider]  dicyclomine (BENTYL) 20 MG tablet TAKE 1 TABLET (20 MG TOTAL) BY MOUTH DAILY AS NEEDED FOR SPASMS. 05/03/22  Yes Ladell Pier, MD  glucose blood (ACCU-CHEK GUIDE) test strip Use as instructed 11/23/21  Yes Ladell Pier, MD  hydrocortisone (CORTEF) 10 MG tablet TAKE 3 TABLETS BY MOUTH EVERY DAY 11/17/22  Yes Philemon Kingdom, MD  levothyroxine (SYNTHROID) 175 MCG tablet TAKE 1 TABLET BY MOUTH EVERY DAY BEFORE BREAKFAST 01/06/23  Yes Ladell Pier, MD  Multiple Vitamin (MULTIVITAMIN WITH MINERALS) TABS tablet Take 1 tablet by mouth daily.   Yes [provider]  ondansetron (ZOFRAN) 4 MG tablet Take 1 tablet (4 mg total) by mouth daily as needed for nausea or vomiting. 10/27/21  Yes Ladell Pier, MD  ondansetron (ZOFRAN-ODT) 4 MG disintegrating tablet Take 1 tablet (4 mg total) by mouth every 8 (eight) hours as needed for nausea or vomiting. 02/14/22  Yes Carmin Muskrat, MD  polyethylene glycol (MIRALAX / GLYCOLAX) 17 g packet Take 17 g by mouth daily. 05/17/21  Yes Ghimire,  Henreitta Leber, MD  torsemide (DEMADEX) 20 MG tablet TAKE 1 TABLET BY MOUTH DAILY AS NEEDED (FOR WEIGHT GAIN OF 3LBS IN 1 DAY OR 5 LBS IN 2 DAYS.). 06/04/22  Yes Ladell Pier, MD  vitamin B-12 (CYANOCOBALAMIN) 1000 MCG tablet Take 1 tablet (1,000 mcg total) by mouth daily. 05/16/21  Yes Ghimire, Henreitta Leber, MD  amLODipine (NORVASC) 10 MG tablet TAKE 1 TABLET (10 MG TOTAL) BY MOUTH DAILY. 01/12/23 01/12/24  Argentina Donovan, PA-C  hydrALAZINE (APRESOLINE) 10 MG tablet Take 1.5 tablets (15 mg total) by mouth 3 (three) times daily. 01/12/23   Argentina Donovan, PA-C  isosorbide mononitrate (IMDUR) 60 MG 24 hr tablet TAKE 1 TABLET BY MOUTH DAILY 01/12/23 01/12/24  Argentina Donovan, PA-C   omeprazole (PRILOSEC) 40 MG capsule Take 1 capsule (40 mg total) by mouth daily. 01/12/23   Argentina Donovan, PA-C  sertraline (ZOLOFT) 25 MG tablet Take 1 tablet (25 mg total) by mouth daily. 01/12/23   Argentina Donovan, PA-C  traMADol (ULTRAM) 50 MG tablet Take 1 tablet (50 mg total) by mouth 2 (two) times daily as needed. Okay to fill on or after 08/13/2022 01/12/23   Argentina Donovan, PA-C    ROS: Neg HEENT Neg resp Neg cardiac Neg GI Neg GU Neg psych Neg neuro  Objective:   Vitals:   01/12/23 1006 01/12/23 1034  BP: (!) 213/72 (!) 200/80  Pulse: 74   SpO2: 95%   Weight: 118 lb 12.8 oz (53.9 kg)   Height: 5\' 4"  (1.626 m)    Exam Her daughter is here with her today.  History provided and confirmed by both General appearance : Awake, alert, not in any distress. Speech Clear. Not toxic looking HEENT: Atraumatic and Normocephalic Neck: Supple, no JVD. No cervical lymphadenopathy.  Chest: Good air entry bilaterally, CTAB.  No rales/rhonchi/wheezing CVS: S1 S2 regular, no murmurs.  Extremities: B/L Lower Ext shows no edema, both legs are warm to touch Neurology: Awake alert, and oriented X 3, CN II-XII intact, Non focal Skin: No Rash  Data Review Lab Results  Component Value Date   HGBA1C 5.0 02/03/2022   HGBA1C 5.3 10/16/2021   HGBA1C 8.6 (H) 02/13/2021    Assessment & Plan   1. Hypertensive kidney disease with chronic kidney disease stage IV (HCC) Only on higher dose hydralazine for a couple of days - amLODipine (NORVASC) 10 MG tablet; TAKE 1 TABLET (10 MG TOTAL) BY MOUTH DAILY.  Dispense: 90 tablet; Refill: 1 - hydrALAZINE (APRESOLINE) 10 MG tablet; Take 1.5 tablets (15 mg total) by mouth 3 (three) times daily.  Dispense: 135 tablet; Refill: 1  2. Other fatigue - CBC with Differential/Platelet - Vitamin D, 25-hydroxy  3. Iron deficiency anemia, unspecified iron deficiency anemia type cbc  4. Vitamin D deficiency - Vitamin D, 25-hydroxy  5.  Gastroesophageal reflux disease without esophagitis stable - omeprazole (PRILOSEC) 40 MG capsule; Take 1 capsule (40 mg total) by mouth daily.  Dispense: 90 capsule; Refill: 1  6. Osteoarthritis of both knees, unspecified osteoarthritis type prn - traMADol (ULTRAM) 50 MG tablet; Take 1 tablet (50 mg total) by mouth 2 (two) times daily as needed. Okay to fill on or after 08/13/2022  Dispense: 60 tablet; Refill: 0  7. Acquired hypothyroidism Will have endocrine RF.  Ordered all relevant labs today in hopes she would not need additional labs at endocrine appt tomoroow - Thyroid Panel With TSH  8. Primary hypertension Uncontrolled-new dose hydralazine from 10mg  tid  to 15 mg tid Sit still and quiet for 5 mins while deep breathing.  Then check blood pressure and record.  Bring these readings to your next visit.  Also bring your blood pressure cuff.  - Comprehensive metabolic panel - amLODipine (NORVASC) 10 MG tablet; TAKE 1 TABLET (10 MG TOTAL) BY MOUTH DAILY.  Dispense: 90 tablet; Refill: 1 - hydrALAZINE (APRESOLINE) 10 MG tablet; Take 1.5 tablets (15 mg total) by mouth 3 (three) times daily.  Dispense: 135 tablet; Refill: 1  9. Chronic diastolic heart failure (HCC) - isosorbide mononitrate (IMDUR) 60 MG 24 hr tablet; TAKE 1 TABLET BY MOUTH DAILY  Dispense: 90 tablet; Refill: 1  10. Prediabetes - Hemoglobin A1c - Lipid panel - Comprehensive metabolic panel  11. Need for immunization against influenza - Flu Vaccine QUAD 87mo+IM (Fluarix, Fluzone & Alfiuria Quad PF)    Return in about 4 weeks (around 02/09/2023) for [facility] in 4 weeks BP; PCP in 4 months.  The patient was given clear instructions to go to ER or return to medical center if symptoms don't improve, worsen or new problems develop. The patient verbalized understanding. The patient was told to call to get lab results if they haven't heard anything in the next week.      Freeman Caldron, PA-C [facility] and  [facility] [place], [place]   01/12/2023, 11:38 AM

## 2023-01-12 NOTE — Patient Instructions (Signed)
Sit still and quiet for 5 mins while deep breathing.  Then check blood pressure and record.  Bring these readings to your next visit.  Also bring your blood pressure cuff.   Ask your endocrinologist to send your thyroid medication tomorrow.  I did not send it today in case the dose of the medication has to change

## 2023-01-13 ENCOUNTER — Encounter: Payer: Self-pay | Admitting: Internal Medicine

## 2023-01-13 ENCOUNTER — Ambulatory Visit (INDEPENDENT_AMBULATORY_CARE_PROVIDER_SITE_OTHER): Payer: Medicaid Other | Admitting: Internal Medicine

## 2023-01-13 VITALS — BP 150/74 | HR 91 | Ht 64.0 in | Wt 116.2 lb

## 2023-01-13 DIAGNOSIS — E039 Hypothyroidism, unspecified: Secondary | ICD-10-CM

## 2023-01-13 DIAGNOSIS — E1159 Type 2 diabetes mellitus with other circulatory complications: Secondary | ICD-10-CM | POA: Diagnosis not present

## 2023-01-13 DIAGNOSIS — E038 Other specified hypothyroidism: Secondary | ICD-10-CM

## 2023-01-13 DIAGNOSIS — E1165 Type 2 diabetes mellitus with hyperglycemia: Secondary | ICD-10-CM

## 2023-01-13 DIAGNOSIS — E2740 Unspecified adrenocortical insufficiency: Secondary | ICD-10-CM

## 2023-01-13 LAB — CBC WITH DIFFERENTIAL/PLATELET
Basophils Absolute: 0.1 10*3/uL (ref 0.0–0.2)
Basos: 1 %
EOS (ABSOLUTE): 0.2 10*3/uL (ref 0.0–0.4)
Eos: 2 %
Hematocrit: 33.3 % — ABNORMAL LOW (ref 34.0–46.6)
Hemoglobin: 11 g/dL — ABNORMAL LOW (ref 11.1–15.9)
Immature Grans (Abs): 0 10*3/uL (ref 0.0–0.1)
Immature Granulocytes: 0 %
Lymphocytes Absolute: 3 10*3/uL (ref 0.7–3.1)
Lymphs: 37 %
MCH: 29.7 pg (ref 26.6–33.0)
MCHC: 33 g/dL (ref 31.5–35.7)
MCV: 90 fL (ref 79–97)
Monocytes Absolute: 0.4 10*3/uL (ref 0.1–0.9)
Monocytes: 5 %
Neutrophils Absolute: 4.4 10*3/uL (ref 1.4–7.0)
Neutrophils: 55 %
Platelets: 164 10*3/uL (ref 150–450)
RBC: 3.7 x10E6/uL — ABNORMAL LOW (ref 3.77–5.28)
RDW: 13.8 % (ref 11.7–15.4)
WBC: 8.1 10*3/uL (ref 3.4–10.8)

## 2023-01-13 LAB — VITAMIN D 25 HYDROXY (VIT D DEFICIENCY, FRACTURES): Vit D, 25-Hydroxy: 33.2 ng/mL (ref 30.0–100.0)

## 2023-01-13 LAB — COMPREHENSIVE METABOLIC PANEL
ALT: 20 IU/L (ref 0–32)
AST: 21 IU/L (ref 0–40)
Albumin/Globulin Ratio: 1.7 (ref 1.2–2.2)
Albumin: 4.6 g/dL (ref 3.9–4.9)
Alkaline Phosphatase: 72 IU/L (ref 44–121)
BUN/Creatinine Ratio: 13 (ref 12–28)
BUN: 22 mg/dL (ref 8–27)
Bilirubin Total: 0.4 mg/dL (ref 0.0–1.2)
CO2: 21 mmol/L (ref 20–29)
Calcium: 9.7 mg/dL (ref 8.7–10.3)
Chloride: 104 mmol/L (ref 96–106)
Creatinine, Ser: 1.72 mg/dL — ABNORMAL HIGH (ref 0.57–1.00)
Globulin, Total: 2.7 g/dL (ref 1.5–4.5)
Glucose: 138 mg/dL — ABNORMAL HIGH (ref 70–99)
Potassium: 4.3 mmol/L (ref 3.5–5.2)
Sodium: 141 mmol/L (ref 134–144)
Total Protein: 7.3 g/dL (ref 6.0–8.5)
eGFR: 33 mL/min/{1.73_m2} — ABNORMAL LOW (ref >59)

## 2023-01-13 LAB — THYROID PANEL WITH TSH
Free Thyroxine Index: 3.2 (ref 1.2–4.9)
T3 Uptake Ratio: 31 % (ref 24–39)
T4, Total: 10.3 ug/dL (ref 4.5–12.0)
TSH: 0.341 u[IU]/mL — ABNORMAL LOW (ref 0.450–4.500)

## 2023-01-13 LAB — POCT GLUCOSE (DEVICE FOR HOME USE): Glucose Fasting, POC: 160 mg/dL — AB (ref 70–99)

## 2023-01-13 LAB — LIPID PANEL
Chol/HDL Ratio: 4.9 ratio — ABNORMAL HIGH (ref 0.0–4.4)
Cholesterol, Total: 252 mg/dL — ABNORMAL HIGH (ref 100–199)
HDL: 51 mg/dL (ref >39)
LDL Chol Calc (NIH): 179 mg/dL — ABNORMAL HIGH (ref 0–99)
Triglycerides: 124 mg/dL (ref 0–149)
VLDL Cholesterol Cal: 22 mg/dL (ref 5–40)

## 2023-01-13 LAB — HEMOGLOBIN A1C
Est. average glucose Bld gHb Est-mCnc: 223 mg/dL
Hgb A1c MFr Bld: 9.4 % — ABNORMAL HIGH (ref 4.8–5.6)

## 2023-01-13 MED ORDER — FREESTYLE LIBRE 3 SENSOR MISC: 1.0000 | 3 refills | Status: DC

## 2023-01-13 MED ORDER — GLUCAGON 3 MG/DOSE NA POWD: 3.0000 mg | Freq: Once | NASAL | 11 refills | Status: DC | PRN

## 2023-01-13 MED ORDER — HYDROCORTISONE 10 MG PO TABS: 25.0000 mg | ORAL_TABLET | Freq: Every day | ORAL | 2 refills | Status: DC

## 2023-01-13 MED ORDER — EZETIMIBE 10 MG PO TABS: 10.0000 mg | ORAL_TABLET | Freq: Every day | ORAL | 3 refills | Status: DC

## 2023-01-13 NOTE — Progress Notes (Signed)
Patient ID: [name], female   DOB: [id number], 62 y.o.   MRN: [id number]  HPI: [name] is a 62 y.o.-year-old female, returning for follow-up for DM2, h/o GDM, dx in 2004, non-insulin-dependent, uncontrolled, with complications (aortic atherosclerosis, CHF, CKD, history of hypoglycemia) and also adrenal insufficiency and hypothyroidism. Pt. previously saw Dr. Loanne Drilling, last visit 11 months ago.  She is here with her daughter who offers part of the history especially regarding PMH, medication history, diet.  DM2:  Reviewed HbA1c: Lab Results  Component Value Date   HGBA1C 9.4 (H) 01/12/2023   HGBA1C 5.0 02/03/2022   HGBA1C 5.3 10/16/2021   HGBA1C 8.6 (H) 02/13/2021   HGBA1C 6.6 10/29/2020   HGBA1C 9.2 (H) 03/28/2018   HGBA1C 5.3 07/08/2017   HGBA1C 5.6 05/09/2017   HGBA1C 8.4 (H) 06/13/2011   HGBA1C (H) 05/10/2011    9.6 (NOTE)                                                                       According to the ADA Clinical Practice Recommendations for 2011, when HbA1c is used as a screening test:   >=6.5%   Diagnostic of Diabetes Mellitus           (if abnormal result  is confirmed)  5.7-6.4%   Increased risk of developing Diabetes Mellitus  References:Diagnosis and Classification of Diabetes Mellitus,Diabetes [id number] 1):S62-S69 and Standards of Medical Care in         Diabetes - 2011,Diabetes Care,2011,34  (Suppl 1):S11-S61.   Pt is not on any diabetic medications. She was on Repaglinide in the past 2/2 multiple hypoglycemia episodes. She was on insulin long time ago. She was on Metformin, but taken off to kidney dysfunction.  Pt checks her sugars 2-3x a day (w/2 glucometers) and they are: - am: 54-74 - 2h after b'fast: n/c - before lunch: 100-120 - 2h after lunch: 140-150 - before dinner: 105-110 - 2h after dinner: n/c - bedtime: n/c - nighttime: n/c Lowest sugar was 54; she has hypoglycemia awareness at 70.  Highest sugar was 150. She does not have  a glucagon kit at home.  Glucometer: Accu-Chek  Pt's meals are: - Breakfast: toast or crackers, tea - Lunch: soup or rice + chicken + veggies - Dinner: same or curry + rothis - Snacks: occasionally  - + CKD, last BUN/creatinine:  Lab Results  Component Value Date   BUN 22 01/12/2023   BUN 26 (H) 02/14/2022   CREATININE 1.72 (H) 01/12/2023   CREATININE 1.59 (H) 02/14/2022  She is not on ACE inhibitor/ARB.  -+ Significant HL; last set of lipids: Lab Results  Component Value Date   CHOL 252 (H) 01/12/2023   HDL 51 01/12/2023   LDLCALC 179 (H) 01/12/2023   TRIG 124 01/12/2023   CHOLHDL 4.9 (H) 01/12/2023  She is not on a statin. Prev. On statins - stopped 2/2 transaminitis.  However, latest LFTs were normal: Lab Results  Component Value Date   ALT 20 01/12/2023   AST 21 01/12/2023   ALKPHOS 72 01/12/2023   BILITOT 0.4 01/12/2023    - last eye exam was in 2023. No DR reportedly. + cataracts.  - no numbness and tingling in her feet.  + Extensive FH  of DM and prediabetes, including in daughters.  Hypothyroidism:  Pt is on levothyroxine 175 mcg daily, taken: - in am - fasting - coffee + creamer  - 2h later - at least 1h from b'fast - + calcium ~3h later - no iron - + multivitamins ~3h later - + PPIs ~3.5h later - not on Biotin  Reviewed her TSH levels: Lab Results  Component Value Date   TSH 0.341 (L) 01/12/2023   TSH 15.900 (H) 03/01/2022   TSH 4.810 (H) 01/18/2022   TSH 1.738 06/02/2021   TSH 0.989 05/13/2021   TSH 3.03 02/13/2021   TSH 17.700 (H) 10/29/2020   TSH 6.165 (H) 03/28/2018   TSH 3.193 05/10/2017   TSH 4.233 06/13/2011   TSH 3.862 06/12/2011   TSH 17.622 (H) 05/11/2011   + FH of thyroid ds. In niece and nephew. No FH of ThyCA.  Adrenal insufficiency:  - dx'ed in 2012, during hospital stay  for cardioarrhythmia. - She came off Hydrocortisone in 2022 - as she had a period w/o insurance.  However, she was started back on hydrocortisone by  Dr. Loanne Drilling in 01/2022 - She is on: Hydrocortisone 30 mg daily in am   Reviewed previous investigation:   It appears that the 01/2022 set of labs were checked on hydrocortisone, but I am not sure if the rest of the measurements were checked off or on hydrocortisone.  However, I believe that the levels obtained in 2012 were checked before starting hydrocortisone: Component     Latest Ref Rng 05/11/2011 05/12/2011 06/12/2011  Cortisol, Base     ug/dL  0.5.    Cortisol, 30 Min     >20 ug/dL  14.7    Cortisol, 60 Min     >20 ug/dL  15.1    Cortisol - AM     4.3 - 22.4 ug/dL 0.5 (L)     Cortisol, Plasma     ug/dL   6.5     ROS: + see HPI No increased urination, blurry vision, chest pain. She has chronic nausea.  Past Medical History:  Diagnosis Date   Adrenal insufficiency, primary ([place])    Anemia 2018   Hgb 6.8 early 11/2020.  PRBC x 1.  06/2017 PRBC x 1.     Cerebral ventriculomegaly    CHF (congestive heart failure) ([place]) 99991111   Diastolic   CKD (chronic kidney disease) stage 4, GFR 15-29 ml/min ([place]) 04/2021   Stage 4 as of late May 2022   Diabetes mellitus without complication ([place])    Hypertension    Hypothyroidism    Past Surgical History:  Procedure Laterality Date   CESAREAN SECTION     IR THORACENTESIS ASP PLEURAL SPACE W/IMG GUIDE  06/05/2021   Social History   Socioeconomic History   Marital status: Single    Spouse name: Not on file   Number of children: 5   Years of education: Not on file   Highest education level: High school graduate  Occupational History   Not on file  Tobacco Use   Smoking status: Never   Smokeless tobacco: Never  Vaping Use   Vaping Use: Never used  Substance and Sexual Activity   Alcohol use: No   Drug use: No   Sexual activity: Not on file  Other Topics Concern   Not on file  Social History Narrative   01/06/22 lives with son and his family   Social Determinants of Health   Financial Resource Strain: Not on file  Food Insecurity: No Food Insecurity (03/03/2022)   Hunger Vital Sign    Worried About Running Out of Food in the Last Year: Never true    Ran Out of Food in the Last Year: Never true  Transportation Needs: Not on file  Physical Activity: Not on file  Stress: Not on file  Social Connections: Not on file  Intimate Partner Violence: Not on file   Current Outpatient Medications on File Prior to Visit  Medication Sig Dispense Refill   Accu-Chek Softclix Lancets lancets Use as instructed 100 each 12   amLODipine (NORVASC) 10 MG tablet TAKE 1 TABLET (10 MG TOTAL) BY MOUTH DAILY. 90 tablet 1   aspirin 81 MG chewable tablet Chew 81 mg by mouth daily.     Blood Glucose Monitoring Suppl (ACCU-CHEK GUIDE) w/Device KIT UAD 1 kit 0   Calcium Carbonate-Vit D-Min (CALCIUM 1200 PO) Take 1,200 mg by mouth daily.     dicyclomine (BENTYL) 20 MG tablet TAKE 1 TABLET (20 MG TOTAL) BY MOUTH DAILY AS NEEDED FOR SPASMS. 30 tablet 1   glucose blood (ACCU-CHEK GUIDE) test strip Use as instructed 100 each 12   hydrALAZINE (APRESOLINE) 10 MG tablet Take 1.5 tablets (15 mg total) by mouth 3 (three) times daily. 135 tablet 1   hydrocortisone (CORTEF) 10 MG tablet TAKE 3 TABLETS BY MOUTH EVERY DAY 90 tablet 2   isosorbide mononitrate (IMDUR) 60 MG 24 hr tablet TAKE 1 TABLET BY MOUTH DAILY 90 tablet 1   levothyroxine (SYNTHROID) 175 MCG tablet TAKE 1 TABLET BY MOUTH EVERY DAY BEFORE BREAKFAST 30 tablet 0   Multiple Vitamin (MULTIVITAMIN WITH MINERALS) TABS tablet Take 1 tablet by mouth daily.     omeprazole (PRILOSEC) 40 MG capsule Take 1 capsule (40 mg total) by mouth daily. 90 capsule 1   ondansetron (ZOFRAN) 4 MG tablet Take 1 tablet (4 mg total) by mouth daily as needed for nausea or vomiting. 30 tablet 3   ondansetron (ZOFRAN-ODT) 4 MG disintegrating tablet Take 1 tablet (4 mg total) by mouth every 8 (eight) hours as needed for nausea or vomiting. 20 tablet 0   polyethylene glycol (MIRALAX / GLYCOLAX) 17 g packet  Take 17 g by mouth daily. 30 each 0   sertraline (ZOLOFT) 25 MG tablet Take 1 tablet (25 mg total) by mouth daily. 90 tablet 1   torsemide (DEMADEX) 20 MG tablet TAKE 1 TABLET BY MOUTH DAILY AS NEEDED (FOR WEIGHT GAIN OF 3LBS IN 1 DAY OR 5 LBS IN 2 DAYS.). 90 tablet 1   traMADol (ULTRAM) 50 MG tablet Take 1 tablet (50 mg total) by mouth 2 (two) times daily as needed. Okay to fill on or after 08/13/2022 60 tablet 0   vitamin B-12 (CYANOCOBALAMIN) 1000 MCG tablet Take 1 tablet (1,000 mcg total) by mouth daily. 30 tablet 0   No current facility-administered medications on file prior to visit.   Allergies  Allergen Reactions   Beef-Derived Products     RELIGOUS  REASONS   Pork-Derived Products     RELIGIOUS REASONS   Percocet [Oxycodone-Acetaminophen] Rash   Family History  Problem Relation Age of Onset   Diabetes Mother    Hyperlipidemia Father    Hypertension Father    Adrenal disorder Neg Hx     PE: BP (!) 150/74 (BP Location: Right Arm, Patient Position: Sitting, Cuff Size: Normal)   Pulse 91   Ht 5' 4"$  (1.626 m)   Wt 116 lb 3.2 oz (52.7 kg)  SpO2 96%   BMI 19.95 kg/m  Wt Readings from Last 3 Encounters:  01/13/23 116 lb 3.2 oz (52.7 kg)  01/12/23 118 lb 12.8 oz (53.9 kg)  07/30/22 100 lb 1.4 oz (45.4 kg)   Constitutional: Thin, in NAD Eyes:  EOMI, no exophthalmos ENT: no neck masses, no cervical lymphadenopathy Cardiovascular: RRR, No MRG, + mild periankle swelling B Respiratory: CTA B Musculoskeletal: no deformities Skin:no rashes Neurological: + mild tremor with outstretched hands Diabetic Foot Exam - Simple   Simple Foot Form Diabetic Foot exam was performed with the following findings: Yes 01/13/2023  9:14 AM  Visual Inspection No deformities, no ulcerations, no other skin breakdown bilaterally: Yes Sensation Testing Intact to touch and monofilament testing bilaterally: Yes Pulse Check See comments: Yes Comments Decreased post. Tibialis pulses B     ASSESSMENT: 1. DM, non-insulin-dependent, uncontrolled, with complications - CHF - Aortic atherosclerosis - CKD stage III-IV  2.  Hypothyroidism  3.  Adrenal insufficiency  4. HL - has FH of this   PLAN:  1.  Complex patient with long-standing, uncontrolled diabetes, not on any diabetic medications, with a very high HbA1c obtained yesterday: 9.4%, increased from 5.0%.  However, per review of her blood sugars at home, when checked with her to glucometers (different brands), sugars appear to be either at goal since low, down to the 50s in the morning.  We checked her glucose with glucometer in the clinic and this was 160.  She had PT this morning, but not coughing.  We did discuss fatigue may increase the blood sugars.  However, the discrepancy between this blood sugar and the values that she obtains at home is perplexing.  The HbA1c correlates better with the blood sugar obtained in the office.  I advised her to try to change her test strips (they may be expired) but for now I did not suggest any medication changes.  Daughter is wondering whether his CGM would be a good idea for her and it indeed would, if covered.  I sent the prescription for the freestyle libre 3 CGM to her pharmacy.  Daughter wears one and can show her how to use it. -Patient and her daughter also determined to improve her diet, which was relaxed in the last few weeks.  This can also help. -At today's visit, however, I also would like to check her for insulin deficiency and pancreatic autoimmunity by checking antipancreatic antibodies.  If these are negative, she would greatly benefit from an SGLT2 inhibitor.  I am worried about using a GLP-1 receptor agonist due to the low body weight and hypoglycemic values.  She also has a decreased appetite and GLP-1 receptor agonist could make this worse. - I suggested to:  Patient Instructions  Try to change the test strips.  Please try to decrease the hydrocortisone dose to: - 25  mg in am - stay on this dose for 1 month, then - 20 mg in am - stay on this dose until you come back  Please decrease levothyroxine dose to 150 mcg daily.  Take the thyroid hormone every day, with water, at least 30 minutes before breakfast, separated by at least 4 hours from: - acid reflux medications - calcium - iron - multivitamins  Please come back for labs in 1.5 months to check the thyroid dose.  Regarding hydrocortisone: - You absolutely need to take this medication every day and not skip doses. - Please double the dose if you have a fever, for the duration  of the fever. - If you cannot take anything by mouth (vomiting) or you have severe diarrhea so that you eliminate the hydrocortisone pills in your stool, please make sure that you get the hydrocortisone in the vein instead - go to the nearest emergency department/urgent care or you may go to your PCPs office  - Please try to get a MedAlert bracelet or pendant indicating: "Adrenal insufficiency".   Please stop at the lab.  Please start: - Ezetimibe 10 mg daily.  Please return in 3 months with your sugar log.   - check sugars at different times of the day - check 4x a day, rotating checks - discussed about CBG targets for treatment: 80-130 mg/dL before meals and <180 mg/dL after meals; target HbA1c <7%. - given foot care handout  - given instructions for hypoglycemia management "15-15 rule"  - I also called in a prescription for glucagon to her pharmacy and explained how and when to use it - advised for yearly eye exams -she is up-to-date - we also reviewed her very high LDL >> 2/2 h/o transaminitis with statins >> we discussed to start Zetia 10 g daily >> she and her daughter agree - Return to clinic in 3 months  2.  Hypothyroidism - latest thyroid labs reviewed with pt. >> TSH was suppressed, after previously being elevated: Lab Results  Component Value Date   TSH 0.341 (L) 01/12/2023  - she continues on LT4 175 mcg  daily >> will decrease the dose to 150 mcg daily - pt feels good on this dose except for.  She was able to gain some weight since the summer, which she is happy about. - we discussed about taking the thyroid hormone every day, with water, >30 minutes before breakfast, separated by >4 hours from acid reflux medications, calcium, iron, multivitamins. Pt. is taking it correctly, but her multivitamins, calcium, and PPIs are not quite 4 hours later -we discussed about trying to separate is a little bit more from the levothyroxine. - will check thyroid tests in 1.5 mo  3.  Adrenal insufficiency -Patient with history of primary adrenal insufficiency per review of Dr. Cordelia Pen notes.  This was diagnosed in 2012.  She was initially started on hydrocortisone and she continued to this for 10 years.  She was briefly off the medication after cortisol levels were normal, but had to restart in 2022.  Reviewing the labs from 2012, she had a low a.m. cortisol, but it stimulated to 15, which, per today's standards is considered normal.  I do not have an ACTH from 2012, but this was low in 2022.  However, it is unclear whether it was checked off hydrocortisone at that time.  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 -She is currently taking 30 mg of hydrocortisone daily. We discussed that this is a high dose for her BMI and we should try to decrease the dose slowly, to the minimum dose that allows her to feel well. -She denies fatigue, weight loss, but has headaches, nausea -I advised her to take the hydrocortisone with a meal. -We also discussed about the fact that hydrocortisone is best to be taken twice a day, but for now we will just try to decrease the dose and see if we need to split the dose at next visit, since she already got used to taking  it just once a  day -We discussed about sick day rules: - You absolutely need to take this medication every day and not skip doses. - Please double the dose if you have a fever, for the duration of the fever. - If you cannot take anything by mouth (vomiting) or you have severe diarrhea so that you eliminate the hydrocortisone pills in your stool, please make sure that you get the hydrocortisone in the vein instead - go to the nearest emergency department/urgent care or you may go to your PCPs office  - Please try to get a MedAlert bracelet or pendant indicating: "Adrenal insufficiency".  -We also discussed about trying to stay on the lowest dose of hydrocortisone that allows her to feel well.  We reviewed the possible side effects of taking too much steroid.  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  Orders Placed This Encounter  Procedures   Glutamic acid decarboxylase auto abs   ZNT8 Antibodies   IA-2 Antibody   Insulin antibodies, blood   C-peptide   Glucose, fasting   21-Hydroxylase Antibodies   Thyroglobulin antibody   Thyroid peroxidase antibody   POCT Glucose (Device for Home Use)   - Total time spent for the visit: 50 min, in precharting, postcharting,  reviewing Dr. Cordelia Pen last note, obtaining medical information from the chart and from the pt, reviewing her  previous labs, evaluations, and treatments, reviewing her symptoms, counseling her about her diabetes and the rest of her endocrine conditions  (please see the discussed topics above), and developing a plan to further investigate and treat them; she and her daughter had a number of questions which I addressed.  Component     Latest Ref Rng 01/13/2023  Glucose     65 - 99 mg/dL 176 (H)   POC Glucose      70 - 99 mg/dl   Glucose Fasting, POC     70 - 99 mg/dL 160 !   Glutamic Acid Decarb Ab     <5 IU/mL <5   ZNT8 Antibodies     <15 U/mL <10   IA-2 Antibody     <5.4 U/mL <5.4   Insulin Antibodies, Human     <0.4 U/mL <0.4   C-Peptide     0.80 - 3.85 ng/mL 1.29   21-Hydroxylase Antibodies     NEGATIVE  NEGATIVE   Thyroglobulin Ab     < or = 1 IU/mL <1   Thyroperoxidase Ab SerPl-aCnc     <9 IU/mL 2   No Hashimoto's thyroiditis, insulin deficiency, antipancreatic or antiadrenal antibodies.  Philemon Kingdom, MD PhD [facility] Endocrinology

## 2023-01-13 NOTE — Patient Instructions (Addendum)
Try to change the test strips.  Please try to decrease the hydrocortisone dose to: - 25 mg in am - stay on this dose for 1 month, then - 20 mg in am - stay on this dose until you come back  Please decrease levothyroxine dose to 150 mcg daily.  Take the thyroid hormone every day, with water, at least 30 minutes before breakfast, separated by at least 4 hours from: - acid reflux medications - calcium - iron - multivitamins  Please come back for labs in 1.5 months to check the thyroid dose.  Regarding hydrocortisone: - You absolutely need to take this medication every day and not skip doses. - Please double the dose if you have a fever, for the duration of the fever. - If you cannot take anything by mouth (vomiting) or you have severe diarrhea so that you eliminate the hydrocortisone pills in your stool, please make sure that you get the hydrocortisone in the vein instead - go to the nearest emergency department/urgent care or you may go to your PCPs office  - Please try to get a MedAlert bracelet or pendant indicating: "Adrenal insufficiency".   Please stop at the lab.  Please start: - Ezetimibe 10 mg daily.  Please return in 3 months with your sugar log.   PATIENT INSTRUCTIONS FOR TYPE 2 DIABETES:  **Please join MyChart!** - see attached instructions about how to join if you have not done so already.  DIET AND EXERCISE Diet and exercise is an important part of diabetic treatment.  We recommended aerobic exercise in the form of brisk walking (working between 40-60% of maximal aerobic capacity, similar to brisk walking) for 150 minutes per week (such as 30 minutes five days per week) along with 3 times per week performing 'resistance' training (using various gauge rubber tubes with handles) 5-10 exercises involving the major muscle groups (upper body, lower body and core) performing 10-15 repetitions (or near fatigue) each exercise. Start at half the above goal but build slowly to  reach the above goals. If limited by weight, joint pain, or disability, we recommend daily walking in a swimming pool with water up to waist to reduce pressure from joints while allow for adequate exercise.    BLOOD GLUCOSES Monitoring your blood glucoses is important for continued management of your diabetes. Please check your blood glucoses 2-4 times a day: fasting, before meals and at bedtime (you can rotate these measurements - e.g. one day check before the 3 meals, the next day check before 2 of the meals and before bedtime, etc.).   HYPOGLYCEMIA (low blood sugar) Hypoglycemia is usually a reaction to not eating, exercising, or taking too much insulin/ other diabetes drugs.  Symptoms include tremors, sweating, hunger, confusion, headache, etc. Treat IMMEDIATELY with 15 grams of Carbs: 4 glucose tablets  cup regular juice/soda 2 tablespoons raisins 4 teaspoons sugar 1 tablespoon honey Recheck blood glucose in 15 mins and repeat above if still symptomatic/blood glucose <100.  RECOMMENDATIONS TO REDUCE YOUR RISK OF DIABETIC COMPLICATIONS: * Take your prescribed MEDICATION(S) * Follow a DIABETIC diet: Complex carbs, fiber rich foods, (monounsaturated and polyunsaturated) fats * AVOID saturated/trans fats, high fat foods, >2,300 mg salt per day. * EXERCISE at least 5 times a week for 30 minutes or preferably daily.  * DO NOT SMOKE OR DRINK more than 1 drink a day. * Check your FEET every day. Do not wear tightfitting shoes. Contact us if you develop an ulcer * See your EYE doctor once  a year or more if needed * Get a FLU shot once a year * Get a PNEUMONIA vaccine once before and once after age 9 years  GOALS:  * Your Hemoglobin A1c of <7%  * fasting sugars need to be 80-130 * after meals sugars need to be <180 (2h after you start eating) * Your Systolic BP should be 229 or lower  * Your Diastolic BP should be 80 or lower  * Your HDL (Good Cholesterol) should be 40 or higher  *  Your LDL (Bad Cholesterol) should be ideally <70. * Your Triglycerides should be 150 or lower  * Your Urine microalbumin (kidney function) should be <30 * Your Body Mass Index should be 25 or lower   Please consider the following ways to cut down carbs and fat and increase fiber and micronutrients in your diet: - substitute whole grain for white bread or pasta - substitute brown rice for white rice - substitute 90-calorie flat bread pieces for slices of bread when possible - substitute sweet potatoes or yams for white potatoes - substitute humus for margarine - substitute tofu for cheese when possible - substitute almond or rice milk for regular milk (would not drink soy milk daily due to concern for soy estrogen influence on breast cancer risk) - substitute dark chocolate for other sweets when possible - substitute water - can add lemon or orange slices for taste - for diet sodas (artificial sweeteners will trick your body that you can eat sweets without getting calories and will lead you to overeating and weight gain in the long run) - do not skip breakfast or other meals (this will slow down the metabolism and will result in more weight gain over time)  - can try smoothies made from fruit and almond/rice milk in am instead of regular breakfast - can also try old-fashioned (not instant) oatmeal made with almond/rice milk in am - order the dressing on the side when eating salad at a restaurant (pour less than half of the dressing on the salad) - eat as little meat as possible - can try juicing, but should not forget that juicing will get rid of the fiber, so would alternate with eating raw veg./fruits or drinking smoothies - use as little oil as possible, even when using olive oil - can dress a salad with a mix of balsamic vinegar and lemon juice, for e.g. - use agave nectar, stevia sugar, or regular sugar rather than artificial sweateners - steam or broil/roast veggies  - snack on  veggies/fruit/nuts (unsalted, preferably) when possible, rather than processed foods - reduce or eliminate aspartame in diet (it is in diet sodas, chewing gum, etc) Read the labels!  Try to read Dr. Janene Harvey book: "Program for Reversing Diabetes" for other ideas for healthy eating.

## 2023-01-14 ENCOUNTER — Encounter: Payer: Self-pay | Admitting: Physician Assistant

## 2023-01-20 ENCOUNTER — Other Ambulatory Visit: Payer: Self-pay | Admitting: Physician Assistant

## 2023-01-20 DIAGNOSIS — Z419 Encounter for procedure for purposes other than remedying health state, unspecified: Secondary | ICD-10-CM | POA: Diagnosis not present

## 2023-01-20 MED ORDER — BENZONATATE 100 MG PO CAPS: 200.0000 mg | ORAL_CAPSULE | Freq: Three times a day (TID) | ORAL | 0 refills | Status: DC | PRN

## 2023-02-05 LAB — 21-HYDROXYLASE ANTIBODIES: 21-Hydroxylase Antibodies: NEGATIVE

## 2023-02-05 LAB — GLUCOSE, FASTING: Glucose, Bld: 176 mg/dL — ABNORMAL HIGH (ref 65–99)

## 2023-02-05 LAB — ZNT8 ANTIBODIES: ZNT8 Antibodies: <10 U/mL (ref <15)

## 2023-02-05 LAB — GLUTAMIC ACID DECARBOXYLASE AUTO ABS: Glutamic Acid Decarb Ab: <5 IU/mL (ref <5)

## 2023-02-05 LAB — THYROGLOBULIN ANTIBODY: Thyroglobulin Ab: <1 IU/mL (ref <=1)

## 2023-02-05 LAB — INSULIN ANTIBODIES, BLOOD: Insulin Antibodies, Human: <0.4 U/mL (ref <0.4)

## 2023-02-05 LAB — C-PEPTIDE: C-Peptide: 1.29 ng/mL (ref 0.80–3.85)

## 2023-02-05 LAB — IA-2 ANTIBODY: IA-2 Antibody: <5.4 U/mL (ref <5.4)

## 2023-02-05 LAB — THYROID PEROXIDASE ANTIBODY: Thyroperoxidase Ab SerPl-aCnc: 2 IU/mL (ref <9)

## 2023-02-07 ENCOUNTER — Other Ambulatory Visit: Payer: Self-pay | Admitting: Internal Medicine

## 2023-02-08 ENCOUNTER — Ambulatory Visit: Payer: Medicaid Other | Admitting: Pharmacist

## 2023-02-09 ENCOUNTER — Other Ambulatory Visit: Payer: Self-pay | Admitting: Physician Assistant

## 2023-02-09 DIAGNOSIS — I129 Hypertensive chronic kidney disease with stage 1 through stage 4 chronic kidney disease, or unspecified chronic kidney disease: Secondary | ICD-10-CM

## 2023-02-09 DIAGNOSIS — I10 Essential (primary) hypertension: Secondary | ICD-10-CM

## 2023-02-14 ENCOUNTER — Encounter: Payer: Self-pay | Admitting: Internal Medicine

## 2023-02-14 DIAGNOSIS — J029 Acute pharyngitis, unspecified: Secondary | ICD-10-CM | POA: Diagnosis not present

## 2023-02-14 DIAGNOSIS — B349 Viral infection, unspecified: Secondary | ICD-10-CM | POA: Diagnosis not present

## 2023-02-18 DIAGNOSIS — Z419 Encounter for procedure for purposes other than remedying health state, unspecified: Secondary | ICD-10-CM | POA: Diagnosis not present

## 2023-02-24 ENCOUNTER — Other Ambulatory Visit: Payer: Self-pay | Admitting: Internal Medicine

## 2023-02-24 DIAGNOSIS — M170 Bilateral primary osteoarthritis of knee: Secondary | ICD-10-CM

## 2023-03-02 ENCOUNTER — Other Ambulatory Visit (INDEPENDENT_AMBULATORY_CARE_PROVIDER_SITE_OTHER): Payer: Medicaid Other

## 2023-03-02 ENCOUNTER — Other Ambulatory Visit: Payer: Self-pay | Admitting: Internal Medicine

## 2023-03-02 DIAGNOSIS — E039 Hypothyroidism, unspecified: Secondary | ICD-10-CM

## 2023-03-02 DIAGNOSIS — M170 Bilateral primary osteoarthritis of knee: Secondary | ICD-10-CM

## 2023-03-02 LAB — TSH: TSH: 0.05 u[IU]/mL — ABNORMAL LOW (ref 0.35–5.50)

## 2023-03-02 LAB — T4, FREE: Free T4: 1.69 ng/dL — ABNORMAL HIGH (ref 0.60–1.60)

## 2023-03-02 MED ORDER — TRAMADOL HCL 50 MG PO TABS: 50.0000 mg | ORAL_TABLET | Freq: Two times a day (BID) | ORAL | 0 refills | Status: DC | PRN

## 2023-03-03 ENCOUNTER — Encounter: Payer: Self-pay | Admitting: Internal Medicine

## 2023-03-04 ENCOUNTER — Other Ambulatory Visit: Payer: Self-pay | Admitting: Internal Medicine

## 2023-03-04 MED ORDER — LEVOTHYROXINE SODIUM 150 MCG PO TABS: 150.0000 ug | ORAL_TABLET | Freq: Every day | ORAL | 3 refills | Status: DC

## 2023-03-21 DIAGNOSIS — Z419 Encounter for procedure for purposes other than remedying health state, unspecified: Secondary | ICD-10-CM | POA: Diagnosis not present

## 2023-04-05 ENCOUNTER — Other Ambulatory Visit: Payer: Self-pay | Admitting: Internal Medicine

## 2023-04-14 ENCOUNTER — Ambulatory Visit: Payer: Medicaid Other | Admitting: Internal Medicine

## 2023-04-20 DIAGNOSIS — Z419 Encounter for procedure for purposes other than remedying health state, unspecified: Secondary | ICD-10-CM | POA: Diagnosis not present

## 2023-05-13 ENCOUNTER — Ambulatory Visit: Payer: Medicaid Other | Admitting: Internal Medicine

## 2023-05-13 ENCOUNTER — Encounter: Payer: Self-pay | Admitting: Internal Medicine

## 2023-05-13 ENCOUNTER — Other Ambulatory Visit: Payer: Self-pay | Admitting: Internal Medicine

## 2023-05-13 ENCOUNTER — Ambulatory Visit: Payer: Medicaid Other | Attending: Internal Medicine | Admitting: Internal Medicine

## 2023-05-13 VITALS — BP 228/74 | HR 56 | Temp 98.2°F | Ht 64.0 in | Wt 113.0 lb

## 2023-05-13 DIAGNOSIS — E11649 Type 2 diabetes mellitus with hypoglycemia without coma: Secondary | ICD-10-CM

## 2023-05-13 DIAGNOSIS — E1165 Type 2 diabetes mellitus with hyperglycemia: Secondary | ICD-10-CM | POA: Diagnosis not present

## 2023-05-13 DIAGNOSIS — M170 Bilateral primary osteoarthritis of knee: Secondary | ICD-10-CM | POA: Diagnosis not present

## 2023-05-13 DIAGNOSIS — I10 Essential (primary) hypertension: Secondary | ICD-10-CM

## 2023-05-13 DIAGNOSIS — Z1211 Encounter for screening for malignant neoplasm of colon: Secondary | ICD-10-CM | POA: Diagnosis not present

## 2023-05-13 DIAGNOSIS — I129 Hypertensive chronic kidney disease with stage 1 through stage 4 chronic kidney disease, or unspecified chronic kidney disease: Secondary | ICD-10-CM

## 2023-05-13 DIAGNOSIS — K219 Gastro-esophageal reflux disease without esophagitis: Secondary | ICD-10-CM

## 2023-05-13 DIAGNOSIS — I5032 Chronic diastolic (congestive) heart failure: Secondary | ICD-10-CM

## 2023-05-13 DIAGNOSIS — Z1231 Encounter for screening mammogram for malignant neoplasm of breast: Secondary | ICD-10-CM | POA: Diagnosis not present

## 2023-05-13 DIAGNOSIS — Z23 Encounter for immunization: Secondary | ICD-10-CM | POA: Diagnosis not present

## 2023-05-13 DIAGNOSIS — N184 Chronic kidney disease, stage 4 (severe): Secondary | ICD-10-CM

## 2023-05-13 DIAGNOSIS — Z794 Long term (current) use of insulin: Secondary | ICD-10-CM | POA: Diagnosis not present

## 2023-05-13 LAB — GLUCOSE, POCT (MANUAL RESULT ENTRY): POC Glucose: 254 mg/dl — AB (ref 70–99)

## 2023-05-13 LAB — POCT GLYCOSYLATED HEMOGLOBIN (HGB A1C)

## 2023-05-13 MED ORDER — OMEPRAZOLE 40 MG PO CPDR: 40.0000 mg | DELAYED_RELEASE_CAPSULE | Freq: Every day | ORAL | 1 refills | Status: DC

## 2023-05-13 MED ORDER — ONDANSETRON 4 MG PO TBDP: 4.0000 mg | ORAL_TABLET | Freq: Three times a day (TID) | ORAL | 0 refills | Status: DC | PRN

## 2023-05-13 MED ORDER — FREESTYLE LIBRE 3 SENSOR MISC
6 refills | Status: DC
Start: 2023-05-13 — End: 2023-05-19

## 2023-05-13 MED ORDER — TRAMADOL HCL 50 MG PO TABS
50.0000 mg | ORAL_TABLET | Freq: Two times a day (BID) | ORAL | 0 refills | Status: DC | PRN
Start: 2023-05-13 — End: 2023-10-10

## 2023-05-13 MED ORDER — HYDRALAZINE HCL 25 MG PO TABS
25.0000 mg | ORAL_TABLET | Freq: Three times a day (TID) | ORAL | 5 refills | Status: DC
Start: 2023-05-13 — End: 2024-04-26

## 2023-05-13 MED ORDER — SERTRALINE HCL 25 MG PO TABS: 25.0000 mg | ORAL_TABLET | Freq: Every day | ORAL | 1 refills | Status: DC

## 2023-05-13 MED ORDER — AMLODIPINE BESYLATE 10 MG PO TABS
ORAL_TABLET | Freq: Every day | ORAL | 1 refills | Status: DC
Start: 2023-05-13 — End: 2024-04-26

## 2023-05-13 MED ORDER — LANTUS SOLOSTAR 100 UNIT/ML ~~LOC~~ SOPN
6.0000 [IU] | PEN_INJECTOR | Freq: Every day | SUBCUTANEOUS | 99 refills | Status: DC
Start: 2023-05-13 — End: 2024-10-15

## 2023-05-13 MED ORDER — NOVOLOG FLEXPEN 100 UNIT/ML ~~LOC~~ SOPN
PEN_INJECTOR | SUBCUTANEOUS | 11 refills | Status: DC
Start: 2023-05-13 — End: 2024-10-15

## 2023-05-13 MED ORDER — PEN NEEDLES 31G X 8 MM MISC
6 refills | Status: DC
Start: 2023-05-13 — End: 2024-10-15

## 2023-05-13 MED ORDER — ISOSORBIDE MONONITRATE ER 60 MG PO TB24
ORAL_TABLET | Freq: Every day | ORAL | 1 refills | Status: DC
Start: 2023-05-13 — End: 2024-02-06

## 2023-05-13 MED ORDER — FREESTYLE LIBRE 3 READER DEVI
1.0000 | Freq: Every day | 0 refills | Status: DC
Start: 2023-05-13 — End: 2023-05-19

## 2023-05-13 NOTE — Progress Notes (Signed)
Patient ID: [name], female    DOB: 04-15-1961  MRN: [id number]  CC: Diabetes (DM f/u. [name] on R & L knee - requesting refill on Tramadol/Requesting new BS meter & supplies/)   Subjective: [name] is a 62 y.o. female who presents for chronic ds management.   Daughter, [name], is with her Her concerns today include:   history of chronic adrenal insufficiency, DM type 2 (followed by Dr. Everardo All), HTN, stage 4 CKD, diastolic CHF (EF of 65 to 70%, followed by Dr. Cristal Deer), hypothyroid, GERD   DM: Results for orders placed or performed in visit on 05/13/23  POCT glucose (manual entry)  Result Value Ref Range   POC Glucose 254 (A) 70 - 99 mg/dl  POCT glycosylated hemoglobin (Hb A1C)  Result Value Ref Range   Hemoglobin A1C     HbA1c POC (<> result, manual entry)     HbA1c, POC (prediabetic range)     HbA1c, POC (controlled diabetic range) (A)   -A1c today's reading greater than 15  had seen Dr. Elvera Lennox January of this year.  A1c at that time was 9.4.  There was discrepancies between patient's reported blood sugar readings at home and her blood sugar reading and A1c at that visit.  She was not started on any medications.  Prescribe CGM but it was not and covered by insurance. Out of stripes x 3 wks so she has not checked blood sugars in 3 weeks. She tells me that her BS readings were in the 100s but daughter states that was not the case and that pt was not checking regularly. No polyuria/dipsia but drinks a lot of water.  No blurred vision Eating habits not good; eating more junk foods and sweet.   Eye exam coming in August 2024 with Dr. Dione Booze.  Reports however having had an eye exam earlier this year with an optometrist and was told that she has cataracts.    HTN/CKD4/CHF: Patient should be on amlodipine 10 mg daily, hydralazine 15 units 3 times a day, isosorbide 60 mg daily and torsemide 20 mg as needed.  Just took BP meds after being roomed by my CMA today. Checks BP  1-2 daily.  Gives range 150-170/70 No HA/Dizziness/CP/SOB Saw nephrologist earlier this yr.  Reports being told that she is still CKD stage IV.  She is not on any NSAIDs.  OA knee: Requesting refill on  Tramadol.  Denies any significant side effects from the medication.  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   Hypokalemia 05/12/2021   Fecal impaction (HCC) 05/12/2021   Anemia due to chronic kidney disease 05/12/2021   Adrenal insufficiency (Addison's disease) (HCC) 05/12/2021   CKD (chronic kidney disease) stage 4, GFR 15-29 ml/min (HCC) 11/21/2020   Primary hypertension 11/21/2020   New onset of congestive heart failure (HCC) 11/21/2020   Dehydration 12/09/2017   Abdominal pain, bilateral lower quadrant 12/09/2017   Hypoglycemia 07/08/2017   Hypoglycemia associated with diabetes (HCC) 05/10/2017   Malnutrition of moderate degree 05/10/2017     Current Outpatient Medications on File Prior to Visit  Medication Sig Dispense Refill   amLODipine (NORVASC) 10 MG tablet TAKE 1 TABLET (10 MG TOTAL) BY MOUTH DAILY. 90 tablet 1   aspirin 81 MG chewable tablet Chew 81 mg by mouth daily.     Calcium Carbonate-Vit D-Min (CALCIUM 1200 PO) Take 1,200 mg by mouth daily.     ezetimibe (ZETIA) 10 MG tablet Take 1 tablet (10 mg total) by mouth daily. 90 tablet 3   Glucagon 3 MG/DOSE POWD Place 3 mg into the nose once as needed for up to 1 dose. 1 each 11   hydrALAZINE (APRESOLINE) 10 MG tablet Take 1.5 tablets (15 mg total) by mouth 3 (three) times  daily. 405 tablet 0   hydrocortisone (CORTEF) 10 MG tablet Take 2-3 tablets (20-30 mg total) by mouth daily. 300 tablet 3   isosorbide mononitrate (IMDUR) 60 MG 24 hr tablet TAKE 1 TABLET BY MOUTH DAILY 90 tablet 1   levothyroxine (SYNTHROID) 150 MCG tablet Take 1 tablet (150 mcg total) by mouth daily before breakfast. 45 tablet 3   Multiple Vitamin (MULTIVITAMIN WITH MINERALS) TABS tablet Take 1 tablet by mouth daily.     omeprazole (PRILOSEC) 40 MG capsule Take 1 capsule (40 mg total) by mouth daily. 90 capsule 1   ondansetron (ZOFRAN) 4 MG tablet Take 1 tablet (4 mg total) by mouth daily as needed for nausea or vomiting. 30 tablet 3   ondansetron (ZOFRAN-ODT) 4 MG disintegrating tablet Take 1 tablet (4 mg total) by mouth every 8 (eight) hours as needed for nausea or vomiting. 20 tablet 0   sertraline (ZOLOFT) 25 MG tablet Take 1 tablet (25 mg total) by mouth daily. 90 tablet 1   torsemide (DEMADEX) 20 MG tablet TAKE 1 TABLET BY MOUTH DAILY AS NEEDED (FOR WEIGHT GAIN OF 3LBS IN 1 DAY OR 5 LBS IN 2 DAYS.). 90 tablet 1   traMADol (ULTRAM) 50 MG tablet Take 1 tablet (50 mg total) by mouth 2 (two) times daily as needed. 60 tablet 0   vitamin B-12 (CYANOCOBALAMIN) 1000 MCG tablet Take 1 tablet (1,000 mcg total) by mouth daily. 30 tablet 0   Accu-Chek Softclix Lancets lancets Use as instructed (Patient not taking: Reported on 05/13/2023) 100 each 12   benzonatate (TESSALON) 100 MG capsule Take 2 capsules (200 mg total) by mouth 3 (three) times daily as needed. (Patient not taking: Reported on 05/13/2023) 40 capsule 0   Blood Glucose Monitoring Suppl (ACCU-CHEK GUIDE) w/Device KIT UAD (Patient not taking: Reported on 05/13/2023) 1 kit 0   Continuous Blood Gluc Sensor (FREESTYLE LIBRE 3 SENSOR) MISC 1 each by Does not apply route every 14 (fourteen) days. (Patient not taking: Reported on 05/13/2023) 6 each 3   dicyclomine (BENTYL) 20 MG tablet TAKE 1 TABLET (20 MG TOTAL) BY MOUTH DAILY AS NEEDED FOR SPASMS. 30  tablet 1   glucose blood (ACCU-CHEK GUIDE) test strip Use as instructed (Patient not taking: Reported on 05/13/2023) 100 each 12   polyethylene glycol (MIRALAX / GLYCOLAX) 17 g packet Take 17 g by mouth daily. (Patient not taking: Reported on 05/13/2023) 30 each 0   No current facility-administered medications on file prior to visit.    Allergies  Allergen Reactions   Beef-Derived Products     RELIGOUS  REASONS   Pork-Derived Products     RELIGIOUS REASONS   Percocet [Oxycodone-Acetaminophen] Rash    Social History   Socioeconomic History   Marital status: Single    Spouse name: Not on file   Number  of children: 5   Years of education: Not on file   Highest education level: High school graduate  Occupational History   Not on file  Tobacco Use   Smoking status: Never   Smokeless tobacco: Never  Vaping Use   Vaping Use: Never used  Substance and Sexual Activity   Alcohol use: No   Drug use: No   Sexual activity: Not on file  Other Topics Concern   Not on file  Social History Narrative   01/06/22 lives with son and his family   Social Determinants of Health   Financial Resource Strain: Not on file  Food Insecurity: No Food Insecurity (03/03/2022)   Hunger Vital Sign    Worried About Running Out of Food in the Last Year: Never true    Ran Out of Food in the Last Year: Never true  Transportation Needs: Not on file  Physical Activity: Not on file  Stress: Not on file  Social Connections: Not on file  Intimate Partner Violence: Not on file    Family History  Problem Relation Age of Onset   Diabetes Mother    Hyperlipidemia Father    Hypertension Father    Adrenal disorder Neg Hx     Past Surgical History:  Procedure Laterality Date   CESAREAN SECTION     IR THORACENTESIS ASP PLEURAL SPACE W/IMG GUIDE  06/05/2021    ROS: Review of Systems Negative except as stated above  PHYSICAL EXAM: BP (!) 228/74 (BP Location: Left Arm, Patient Position: Sitting, Cuff  Size: Normal)   Pulse (!) 56   Temp 98.2 F (36.8 C) (Oral)   Ht 5\' 4"  (1.626 m)   Wt 113 lb (51.3 kg)   SpO2 98%   BMI 19.40 kg/m   Wt Readings from Last 3 Encounters:  05/13/23 113 lb (51.3 kg)  01/13/23 116 lb 3.2 oz (52.7 kg)  01/12/23 118 lb 12.8 oz (53.9 kg)    Physical Exam  General appearance -older female in NAD Mental status - normal mood, behavior, speech, dress, motor activity, and thought processes Neck - supple, no significant adenopathy Chest - clear to auscultation, no wheezes, rales or rhonchi, symmetric air entry Heart -regular rate and rhythm Extremities -no lower extremity edema MSK: Mild crepitus and discomfort with passive range of motion of both knees.     Latest Ref Rng & Units 01/13/2023    9:20 AM 01/12/2023   10:46 AM 02/14/2022    2:02 PM  CMP  Glucose 65 - 99 mg/dL 161  096  85   BUN 8 - 27 mg/dL  22  26   Creatinine 0.45 - 1.00 mg/dL  4.09  8.11   Sodium 914 - 144 mmol/L  141  133   Potassium 3.5 - 5.2 mmol/L  4.3  4.0   Chloride 96 - 106 mmol/L  104  103   CO2 20 - 29 mmol/L  21  26   Calcium 8.7 - 10.3 mg/dL  9.7  8.5   Total Protein 6.0 - 8.5 g/dL  7.3  7.4   Total Bilirubin 0.0 - 1.2 mg/dL  0.4  0.4   Alkaline Phos 44 - 121 IU/L  72  105   AST 0 - 40 IU/L  21  31   ALT 0 - 32 IU/L  20  25    Lipid Panel     Component Value Date/Time   CHOL 252 (H) 01/12/2023 1046   TRIG 124 01/12/2023 1046  HDL 51 01/12/2023 1046   CHOLHDL 4.9 (H) 01/12/2023 1046   CHOLHDL 3.4 06/13/2011 0810   VLDL 17 06/13/2011 0810   LDLCALC 179 (H) 01/12/2023 1046    CBC    Component Value Date/Time   WBC 8.1 01/12/2023 1046   WBC 9.1 02/14/2022 1402   RBC 3.70 (L) 01/12/2023 1046   RBC 3.09 (L) 02/14/2022 1402   HGB 11.0 (L) 01/12/2023 1046   HCT 33.3 (L) 01/12/2023 1046   PLT 164 01/12/2023 1046   MCV 90 01/12/2023 1046   MCH 29.7 01/12/2023 1046   MCH 29.1 02/14/2022 1402   MCHC 33.0 01/12/2023 1046   MCHC 32.8 02/14/2022 1402   RDW 13.8  01/12/2023 1046   LYMPHSABS 3.0 01/12/2023 1046   MONOABS 0.6 02/14/2022 1402   EOSABS 0.2 01/12/2023 1046   BASOSABS 0.1 01/12/2023 1046    ASSESSMENT AND PLAN: 1. Type 2 diabetes mellitus with hyperglycemia, with long-term current use of insulin (HCC) Not at goal. Discussed on encourage healthy eating habits.  Advised her to eliminate sugary snacks and sugary drinks from her diet. I recommend starting insulin and patient is agreeable.  Start Lantus insulin 60 units at bedtime and NovoLog 2 units with meals.  Went over signs and symptoms of hypoglycemia and how to treat.  Printed information also given on hypoglycemia.  Patient's daughter is on insulin pen and states that she will show her mother how to do the insulin injections. We will try to get her approved for the freestyle libre continuous glucose monitor.  In the meantime, I recommend that she purchase her glucose strips from over-the-counter so that she is able to monitor her blood sugars until she is approved for the continuous glucose monitor. - POCT glucose (manual entry) - POCT glycosylated hemoglobin (Hb A1C) - Continuous Glucose Sensor (FREESTYLE LIBRE 3 SENSOR) MISC; Change Q 2 weeks  Dispense: 2 each; Refill: 6 - Continuous Glucose Receiver (FREESTYLE LIBRE 3 READER) DEVI; 1 Device by Does not apply route daily.  Dispense: 1 each; Refill: 0 - Microalbumin / creatinine urine ratio - insulin glargine (LANTUS SOLOSTAR) 100 UNIT/ML Solostar Pen; Inject 6 Units into the skin at bedtime.  Dispense: 15 mL; Refill: PRN - insulin aspart (NOVOLOG FLEXPEN) 100 UNIT/ML FlexPen; 2 units subcut before meals.  Hold if BS before meal less than 180  Dispense: 15 mL; Refill: 11 - Insulin Pen Needle (PEN NEEDLES) 31G X 8 MM MISC; UAD  Dispense: 100 each; Refill: 6  2. Hypertensive kidney disease with chronic kidney disease stage IV (HCC) Not at goal.  Recommend increasing hydralazine to 25 mg 3 times a day. Continue isosorbide 60 mg daily,  amlodipine 10 mg daily, - isosorbide mononitrate (IMDUR) 60 MG 24 hr tablet; TAKE 1 TABLET BY MOUTH DAILY  Dispense: 90 tablet; Refill: 1 - amLODipine (NORVASC) 10 MG tablet; TAKE 1 TABLET (10 MG TOTAL) BY MOUTH DAILY.  Dispense: 90 tablet; Refill: 1 - hydrALAZINE (APRESOLINE) 25 MG tablet; Take 1 tablet (25 mg total) by mouth 3 (three) times daily.  Dispense: 90 tablet; Refill: 5  3. Chronic diastolic heart failure (HCC) Stable and compensated.  Continue torsemide as needed.  Discussed the importance of getting blood pressure under better control. - isosorbide mononitrate (IMDUR) 60 MG 24 hr tablet; TAKE 1 TABLET BY MOUTH DAILY  Dispense: 90 tablet; Refill: 1  4. Gastroesophageal reflux disease without esophagitis Patient requested refill on Prilosec for acid reflux. - omeprazole (PRILOSEC) 40 MG capsule; Take 1 capsule (40  mg total) by mouth daily.  Dispense: 90 capsule; Refill: 1  5. Osteoarthritis of both knees, unspecified osteoarthritis type Patient tolerating tramadol without significant side effects.  She finds it helpful in decreasing her pain.  We got her up-to-date with controlled substance prescribing agreement today. - traMADol (ULTRAM) 50 MG tablet; Take 1 tablet (50 mg total) by mouth 2 (two) times daily as needed.  Dispense: 60 tablet; Refill: 0  6. Encounter for screening mammogram for malignant neoplasm of breast Referral submitted for mammogram.  7. Need for shingles vaccine Went over recommendation for Shingrix vaccine.  She was agreeable to receiving her first shot today.  8. Screening for colon cancer - Ambulatory referral to Gastroenterology       Patient was given the opportunity to ask questions.  Patient verbalized understanding of the plan and was able to repeat key elements of the plan.   This documentation was completed using Paediatric nurse.  Any transcriptional errors are unintentional.  Orders Placed This Encounter  Procedures    POCT glucose (manual entry)   POCT glycosylated hemoglobin (Hb A1C)     Requested Prescriptions   Pending Prescriptions Disp Refills   sertraline (ZOLOFT) 25 MG tablet 90 tablet 1    Sig: Take 1 tablet (25 mg total) by mouth daily.   ondansetron (ZOFRAN-ODT) 4 MG disintegrating tablet 20 tablet 0    Sig: Take 1 tablet (4 mg total) by mouth every 8 (eight) hours as needed for nausea or vomiting.   isosorbide mononitrate (IMDUR) 60 MG 24 hr tablet 90 tablet 1    Sig: TAKE 1 TABLET BY MOUTH DAILY   omeprazole (PRILOSEC) 40 MG capsule 90 capsule 1    Sig: Take 1 capsule (40 mg total) by mouth daily.   amLODipine (NORVASC) 10 MG tablet 90 tablet 1    Sig: TAKE 1 TABLET (10 MG TOTAL) BY MOUTH DAILY.   traMADol (ULTRAM) 50 MG tablet 60 tablet 0    Sig: Take 1 tablet (50 mg total) by mouth 2 (two) times daily as needed.    No follow-ups on file.  Jonah Blue, MD, FACP

## 2023-05-13 NOTE — Telephone Encounter (Signed)
Requested Prescriptions  Refused Prescriptions Disp Refills   hydrALAZINE (APRESOLINE) 10 MG tablet [Pharmacy Med Name: HYDRALAZINE 10 MG TABLET] 405 tablet 0    Sig: TAKE 1.5 TABLETS (15 MG TOTAL) BY MOUTH 3 TIMES A DAY     Cardiovascular:  Vasodilators Failed - 05/13/2023 11:03 AM      Failed - HCT in normal range and within 360 days    Hematocrit  Date Value Ref Range Status  01/12/2023 33.3 (L) 34.0 - 46.6 % Final         Failed - HGB in normal range and within 360 days    Hemoglobin  Date Value Ref Range Status  01/12/2023 11.0 (L) 11.1 - 15.9 g/dL Final         Failed - RBC in normal range and within 360 days    RBC  Date Value Ref Range Status  01/12/2023 3.70 (L) 3.77 - 5.28 x10E6/uL Final  02/14/2022 3.09 (L) 3.87 - 5.11 MIL/uL Final         Failed - ANA Screen, Ifa, Serum in normal range and within 360 days    Anti Nuclear Antibody(ANA)  Date Value Ref Range Status  05/11/2011 NEGATIVE NEGATIVE Final         Failed - Last BP in normal range    BP Readings from Last 1 Encounters:  05/13/23 (!) 228/74         Passed - WBC in normal range and within 360 days    WBC  Date Value Ref Range Status  01/12/2023 8.1 3.4 - 10.8 x10E3/uL Final  02/14/2022 9.1 4.0 - 10.5 K/uL Final         Passed - PLT in normal range and within 360 days    Platelets  Date Value Ref Range Status  01/12/2023 164 150 - 450 x10E3/uL Final         Passed - Valid encounter within last 12 months    Recent Outpatient Visits           Today Type 2 diabetes mellitus with hyperglycemia, with long-term current use of insulin [facility])   [place] [facility] Marcine Matar, MD   4 months ago Other fatigue   [place] [facility] [place], [place], [place]   1 year ago Abnormal LFTs   [place] [facility] Jonah Blue B, MD   1 year ago Type 2 diabetes mellitus with hypoglycemia without coma, without long-term  current use of insulin [facility])   [place] [facility] [place] [facility] Marcine Matar, MD   1 year ago Diabetes mellitus type 2, noninsulin dependent [facility])   [place] [facility] Marcine Matar, MD

## 2023-05-13 NOTE — Patient Instructions (Addendum)
Start Lantus insulin 6 units at bedtime. Start Novolog insulin 2 units with meals. I have sent prescription for continuous glucose monitor. Increase Hydralazine to 25 mg three times a day.  Hypoglycemia Hypoglycemia is when the sugar (glucose) level in your blood is too low. Low blood sugar can happen to people who have diabetes and people who do not have diabetes. Low blood sugar can happen quickly, and it can be an emergency. What are the causes? This condition happens most often in people who have diabetes. It may be caused by: Diabetes medicine. Not eating enough, or not eating often enough. Doing more physical activity. Drinking alcohol on an empty stomach. If you do not have diabetes, this condition may be caused by: A tumor in the pancreas. Not eating enough, or not eating for long periods at a time (fasting). A very bad infection or illness. Problems after having weight loss (bariatric) surgery. Kidney failure or liver failure. Certain medicines. What increases the risk? This condition is more likely to develop in people who: Have diabetes and take medicines to lower their blood sugar. Abuse alcohol. Have a very bad illness. What are the signs or symptoms? Mild Hunger. Sweating and feeling clammy. Feeling dizzy or light-headed. Being sleepy or having trouble sleeping. Feeling like you may vomit (nauseous). A fast heartbeat. A headache. Blurry vision. Mood changes, such as: Being grouchy. Feeling worried or nervous (anxious). Tingling or loss of feeling (numbness) around your mouth, lips, or tongue. Moderate Confusion and poor judgment. Behavior changes. Weakness. Uneven heartbeat. Trouble with moving (coordination). Very low Very low blood sugar (severe hypoglycemia) is a medical emergency. It can cause: Fainting. Seizures. Loss of consciousness (coma). Death. How is this treated? Treating low blood sugar Low blood sugar is often treated by eating or  drinking something that has sugar in it right away. The food or drink should contain 15 grams of a fast-acting carb (carbohydrate). Options include: 4 oz (120 mL) of fruit juice. 4 oz (120 mL) of regular soda (not diet soda). A few pieces of hard candy. Check food labels to see how many pieces to eat for 15 grams. 1 Tbsp (15 mL) of sugar or honey. 4 glucose tablets. 1 tube of glucose gel. Treating low blood sugar if you have diabetes If you can think clearly and swallow safely, follow the 15:15 rule: Take 15 grams of a fast-acting carb. Talk with your doctor about how much you should take. Always keep a source of fast-acting carb with you, such as: Glucose tablets (take 4 tablets). A few pieces of hard candy. Check food labels to see how many pieces to eat for 15 grams. 4 oz (120 mL) of fruit juice. 4 oz (120 mL) of regular soda (not diet soda). 1 Tbsp (15 mL) of honey or sugar. 1 tube of glucose gel. Check your blood sugar 15 minutes after you take the carb. If your blood sugar is still at or below 70 mg/dL (3.9 mmol/L), take 15 grams of a carb again. If your blood sugar does not go above 70 mg/dL (3.9 mmol/L) after 3 tries, get help right away. After your blood sugar goes back to normal, eat a meal or a snack within 1 hour.  Treating very low blood sugar If your blood sugar is below 54 mg/dL (3 mmol/L), you have very low blood sugar, or severe hypoglycemia. This is an emergency. Get medical help right away. If you have very low blood sugar and you cannot eat or drink, you  will need to be given a hormone called glucagon. A family member or friend should learn how to check your blood sugar and how to give you glucagon. Ask your doctor if you need to have an emergency glucagon kit at home. Very low blood sugar may also need to be treated in a hospital. Follow these instructions at home: General instructions Take over-the-counter and prescription medicines only as told by your doctor. Stay  aware of your blood sugar as told by your doctor. If you drink alcohol: Limit how much you have to: 0-1 drink a day for women who are not pregnant. 0-2 drinks a day for men. Know how much alcohol is in your drink. In the [place] one drink equals one 12 oz bottle of beer (355 mL), one 5 oz glass of wine (148 mL), or one 1 oz glass of hard liquor (44 mL). Be sure to eat food when you drink alcohol. Know that your body absorbs alcohol quickly. This may lead to low blood sugar later. Be sure to keep checking your blood sugar. Keep all follow-up visits. If you have diabetes:  Always have a fast-acting carb (15 grams) with you to treat low blood sugar. Follow your diabetes care plan as told by your doctor. Make sure you: Know the symptoms of low blood sugar. Check your blood sugar as often as told. Always check it before and after exercise. Always check your blood sugar before you drive. Take your medicines as told. Follow your meal plan. Eat on time. Do not skip meals. Share your diabetes care plan with: Your work or school. People you live with. Carry a card or wear jewelry that says you have diabetes. Where to find more information American Diabetes Association: www.diabetes.org Contact a doctor if: You have trouble keeping your blood sugar in your target range. You have low blood sugar often. Get help right away if: You still have symptoms after you eat or drink something that contains 15 grams of fast-acting carb, and you cannot get your blood sugar above 70 mg/dL by following the 16:10 rule. Your blood sugar is below 54 mg/dL (3 mmol/L). You have a seizure. You faint. These symptoms may be an emergency. Get help right away. Call your local emergency services (911 in the [place] Do not wait to see if the symptoms will go away. Do not drive yourself to the hospital. Summary Hypoglycemia happens when the level of sugar (glucose) in your blood is too low. Low blood sugar can happen to  people who have diabetes and people who do not have diabetes. Low blood sugar can happen quickly, and it can be an emergency. Make sure you know the symptoms of low blood sugar and know how to treat it. Always keep a source of sugar (fast-acting carb) with you to treat low blood sugar. This information is not intended to replace advice given to you by your health care provider. Make sure you discuss any questions you have with your health care provider. Document Revised: 11/06/2020 Document Reviewed: 11/06/2020 Elsevier Patient Education  2024 [employer].

## 2023-05-13 NOTE — Telephone Encounter (Signed)
Note from pharmacy: Prior authorization required.

## 2023-05-13 NOTE — Telephone Encounter (Signed)
Can we start a PA for this patient's [name]?

## 2023-05-14 LAB — MICROALBUMIN / CREATININE URINE RATIO
Creatinine, Urine: 120.2 mg/dL
Microalb/Creat Ratio: 16 mg/g creat (ref 0–29)
Microalbumin, Urine: 19.3 ug/mL

## 2023-05-17 ENCOUNTER — Other Ambulatory Visit: Payer: Self-pay

## 2023-05-19 ENCOUNTER — Encounter: Payer: Self-pay | Admitting: Internal Medicine

## 2023-05-19 ENCOUNTER — Other Ambulatory Visit: Payer: Self-pay | Admitting: Internal Medicine

## 2023-05-19 DIAGNOSIS — E1165 Type 2 diabetes mellitus with hyperglycemia: Secondary | ICD-10-CM

## 2023-05-19 DIAGNOSIS — M170 Bilateral primary osteoarthritis of knee: Secondary | ICD-10-CM

## 2023-05-19 MED ORDER — FREESTYLE LIBRE 3 READER DEVI
1.0000 | Freq: Every day | 0 refills | Status: DC
Start: 2023-05-19 — End: 2024-04-26

## 2023-05-19 MED ORDER — FREESTYLE LIBRE 3 SENSOR MISC: 6 refills | Status: DC

## 2023-05-20 ENCOUNTER — Encounter: Payer: Self-pay | Admitting: Internal Medicine

## 2023-05-20 ENCOUNTER — Other Ambulatory Visit: Payer: Self-pay

## 2023-05-20 ENCOUNTER — Other Ambulatory Visit: Payer: Self-pay | Admitting: Internal Medicine

## 2023-05-20 DIAGNOSIS — E1122 Type 2 diabetes mellitus with diabetic chronic kidney disease: Secondary | ICD-10-CM | POA: Diagnosis not present

## 2023-05-20 DIAGNOSIS — E1165 Type 2 diabetes mellitus with hyperglycemia: Secondary | ICD-10-CM

## 2023-05-20 DIAGNOSIS — E2740 Unspecified adrenocortical insufficiency: Secondary | ICD-10-CM | POA: Diagnosis not present

## 2023-05-20 DIAGNOSIS — N2581 Secondary hyperparathyroidism of renal origin: Secondary | ICD-10-CM | POA: Diagnosis not present

## 2023-05-20 DIAGNOSIS — I701 Atherosclerosis of renal artery: Secondary | ICD-10-CM | POA: Diagnosis not present

## 2023-05-20 DIAGNOSIS — I129 Hypertensive chronic kidney disease with stage 1 through stage 4 chronic kidney disease, or unspecified chronic kidney disease: Secondary | ICD-10-CM | POA: Diagnosis not present

## 2023-05-20 DIAGNOSIS — D649 Anemia, unspecified: Secondary | ICD-10-CM | POA: Diagnosis not present

## 2023-05-20 DIAGNOSIS — N184 Chronic kidney disease, stage 4 (severe): Secondary | ICD-10-CM | POA: Diagnosis not present

## 2023-05-21 ENCOUNTER — Encounter: Payer: Self-pay | Admitting: Internal Medicine

## 2023-05-24 ENCOUNTER — Other Ambulatory Visit: Payer: Self-pay

## 2023-05-24 NOTE — Telephone Encounter (Signed)
approved

## 2023-05-31 ENCOUNTER — Encounter: Payer: Self-pay | Admitting: Internal Medicine

## 2023-06-01 ENCOUNTER — Telehealth: Payer: Self-pay

## 2023-06-01 ENCOUNTER — Other Ambulatory Visit: Payer: Self-pay

## 2023-06-01 NOTE — Telephone Encounter (Signed)
A prior authorization for Tramadol has been approved until 11/28/2023. [facility] was able to process prescription for more than a 5 days supply. Patient's daughter, [name], has been notified of approval.

## 2023-06-02 ENCOUNTER — Other Ambulatory Visit: Payer: Self-pay | Admitting: Internal Medicine

## 2023-06-02 MED ORDER — SERTRALINE HCL 50 MG PO TABS: 50.0000 mg | ORAL_TABLET | Freq: Every day | ORAL | 4 refills | Status: DC

## 2023-06-07 ENCOUNTER — Encounter: Payer: Self-pay | Admitting: Internal Medicine

## 2023-06-14 ENCOUNTER — Ambulatory Visit: Payer: Medicaid Other

## 2023-06-16 ENCOUNTER — Ambulatory Visit
Admission: RE | Admit: 2023-06-16 | Discharge: 2023-06-16 | Disposition: A | Discharge status: Home / Self Care | Payer: Medicaid Other | Source: Ambulatory Visit | Attending: Internal Medicine | Admitting: Internal Medicine

## 2023-06-16 DIAGNOSIS — Z1231 Encounter for screening mammogram for malignant neoplasm of breast: Secondary | ICD-10-CM

## 2023-06-20 DIAGNOSIS — Z419 Encounter for procedure for purposes other than remedying health state, unspecified: Secondary | ICD-10-CM | POA: Diagnosis not present

## 2023-06-22 ENCOUNTER — Other Ambulatory Visit: Payer: Self-pay | Admitting: Internal Medicine

## 2023-06-22 DIAGNOSIS — R928 Other abnormal and inconclusive findings on diagnostic imaging of breast: Secondary | ICD-10-CM

## 2023-06-24 ENCOUNTER — Other Ambulatory Visit: Payer: Self-pay | Admitting: Internal Medicine

## 2023-06-27 ENCOUNTER — Encounter: Payer: Self-pay | Admitting: Internal Medicine

## 2023-06-30 ENCOUNTER — Other Ambulatory Visit: Payer: Medicaid Other

## 2023-07-05 ENCOUNTER — Ambulatory Visit
Admission: RE | Admit: 2023-07-05 | Discharge: 2023-07-05 | Disposition: A | Discharge status: Home / Self Care | Payer: Medicaid Other | Source: Ambulatory Visit | Attending: Internal Medicine | Admitting: Internal Medicine

## 2023-07-05 ENCOUNTER — Ambulatory Visit: Payer: Medicaid Other

## 2023-07-05 DIAGNOSIS — R928 Other abnormal and inconclusive findings on diagnostic imaging of breast: Secondary | ICD-10-CM

## 2023-07-07 ENCOUNTER — Telehealth: Payer: Self-pay

## 2023-07-07 ENCOUNTER — Other Ambulatory Visit: Payer: Self-pay | Admitting: Internal Medicine

## 2023-07-07 ENCOUNTER — Ambulatory Visit: Payer: Medicaid Other

## 2023-07-07 VITALS — Ht 64.0 in | Wt 113.0 lb

## 2023-07-07 DIAGNOSIS — N6489 Other specified disorders of breast: Secondary | ICD-10-CM

## 2023-07-07 DIAGNOSIS — Z1211 Encounter for screening for malignant neoplasm of colon: Secondary | ICD-10-CM

## 2023-07-07 MED ORDER — PEG 3350-KCL-NA BICARB-NACL 420 G PO SOLR
4000.0000 mL | Freq: Once | ORAL | 0 refills | Status: AC
Start: 2023-07-07 — End: 2023-07-07

## 2023-07-07 NOTE — Telephone Encounter (Signed)
Reached patient and completed pre visit

## 2023-07-07 NOTE — Telephone Encounter (Signed)
Called patient at [phone], [phone] & [phone] with no answer.  Voice mail was full, so no message was left.

## 2023-07-07 NOTE — Progress Notes (Signed)
No egg or soy allergy known to patient  No issues known to pt with past sedation with any surgeries or procedures Patient denies ever being told they had issues or difficulty with intubation  No FH of Malignant Hyperthermia Pt is not on diet pills Pt is not on  home 02  Pt is not on blood thinners  Pt denies issues with constipation takes miralax No A fib or A flutter Have any cardiac testing pending--no Pt can ambulate  Pt denies use of chewing tobacco Discussed diabetic I weight loss medication holds Discussed NSAID holds Checked BMI Pt instructed to use Singlecare[employer]com or GoodRx for a price reduction on prep  Patient's chart reviewed by Cathlyn Parsons CNRA prior to previsit and patient appropriate for the [facility].  Pre visit completed and red dot placed by patient's name on their procedure day (on provider's schedule).

## 2023-07-21 ENCOUNTER — Ambulatory Visit: Payer: Medicaid Other | Admitting: Internal Medicine

## 2023-07-21 DIAGNOSIS — Z419 Encounter for procedure for purposes other than remedying health state, unspecified: Secondary | ICD-10-CM | POA: Diagnosis not present

## 2023-07-26 ENCOUNTER — Encounter: Payer: Medicaid Other | Admitting: Internal Medicine

## 2023-08-17 ENCOUNTER — Ambulatory Visit: Payer: Medicaid Other | Admitting: Internal Medicine

## 2023-08-17 NOTE — Progress Notes (Deleted)
Patient ID: [name], female   DOB: 1961/03/31, 62 y.o.   MRN: [id number]  HPI: [name] is a 62 y.o.-year-old female, returning for follow-up for DM2, h/o GDM, dx in 2004, non-insulin-dependent, uncontrolled, with complications (aortic atherosclerosis, CHF, CKD, history of hypoglycemia) and also adrenal insufficiency and hypothyroidism. Pt. previously saw Dr. Everardo All, but was noncompliant with visits.  Last visit with me was 7 months ago.   She is here with her daughter who offers part of the history especially regarding PMH, medication history, diet.  Interim history: No increased urination, blurry vision, chest pain.  However, she has chronic nausea.  DM2:  Reviewed HbA1c: Lab Results  Component Value Date   HGBA1C (A) 05/13/2023     Comment:     >15.0   HGBA1C 9.4 (H) 01/12/2023   HGBA1C 5.0 02/03/2022   HGBA1C 5.3 10/16/2021   HGBA1C 8.6 (H) 02/13/2021   HGBA1C 6.6 10/29/2020   HGBA1C 9.2 (H) 03/28/2018   HGBA1C 5.3 07/08/2017   HGBA1C 5.6 05/09/2017   HGBA1C 8.4 (H) 06/13/2011   At last visit, she was not on any diabetic medications.  She was checking blood sugars and they appear to be at goal or even low.   However, in 04/2023, PCP checked an HbA1c and this was undetectably high so she started her on the following regimen: - Lantus 6 units at bedtime - NovoLog 2 units with meals She was on Repaglinide in the past 2/2 multiple hypoglycemia episodes. She was on insulin long time ago. She was on Metformin, but taken off to kidney dysfunction.  Pt checks her sugars 2-3x a day (w/2 glucometers) and they are: - am: 54-74 - 2h after b'fast: n/c - before lunch: 100-120 - 2h after lunch: 140-150 - before dinner: 105-110 - 2h after dinner: n/c - bedtime: n/c - nighttime: n/c Lowest sugar was 54; she has hypoglycemia awareness at 70.  Highest sugar was 150. She does not have a glucagon kit at home.  Glucometer: Accu-Chek  Pt's meals are: - Breakfast: toast or  crackers, tea - Lunch: soup or rice + chicken + veggies - Dinner: same or curry + rothis - Snacks: occasionally  - + CKD, last BUN/creatinine:  06/08/2023: 36/1.6, GFR 36-seeing nephrology Lab Results  Component Value Date   BUN 22 01/12/2023   BUN 26 (H) 02/14/2022   CREATININE 1.72 (H) 01/12/2023   CREATININE 1.59 (H) 02/14/2022   Lab Results  Component Value Date   MICRALBCREAT 16 05/13/2023   MICRALBCREAT 39 (H) 10/29/2020  She is not on ACE inhibitor/ARB.  -+ Significant HL; last set of lipids: Lab Results  Component Value Date   CHOL 252 (H) 01/12/2023   HDL 51 01/12/2023   LDLCALC 179 (H) 01/12/2023   TRIG 124 01/12/2023   CHOLHDL 4.9 (H) 01/12/2023  She is not on a statin. Prev. On statins - stopped 2/2 transaminitis.  However, latest LFTs were normal: Lab Results  Component Value Date   ALT 20 01/12/2023   AST 21 01/12/2023   ALKPHOS 72 01/12/2023   BILITOT 0.4 01/12/2023  At last visit I advised her to start ezetimibe 10 mg daily.  - last eye exam was in 2023. No DR reportedly. + cataracts.  - no numbness and tingling in her feet.  Last foot exam 01/13/2023.  + Extensive FH of DM and prediabetes, including in daughters.  Hypothyroidism:  Pt is on levothyroxine 150 mcg daily (decreased at last visit), taken: - in am - fasting -  coffee + creamer  - 2h later - at least 1h from b'fast - + calcium ~3h later - no iron - + multivitamins ~3h later - + PPIs ~3.5h later - not on Biotin  Reviewed her TSH levels: Lab Results  Component Value Date   TSH 0.05 (L) 03/02/2023   TSH 0.341 (L) 01/12/2023   TSH 15.900 (H) 03/01/2022   TSH 4.810 (H) 01/18/2022   TSH 1.738 06/02/2021   TSH 0.989 05/13/2021   TSH 3.03 02/13/2021   TSH 17.700 (H) 10/29/2020   TSH 6.165 (H) 03/28/2018   TSH 3.193 05/10/2017   TSH 4.233 06/13/2011   TSH 3.862 06/12/2011   TSH 17.622 (H) 05/11/2011   Component     Latest Ref Rng 01/13/2023  Thyroglobulin Ab     < or = 1 IU/mL  <1   Thyroperoxidase Ab SerPl-aCnc     <9 IU/mL 2   No Hashimoto's thyroiditis.  + FH of thyroid ds. In niece and nephew. No FH of ThyCA.  Adrenal insufficiency:  - dx'ed in 2012, during hospital stay  for cardioarrhythmia. - She came off Hydrocortisone in 2022 - as she had a period w/o insurance.  However, she was started back on hydrocortisone by Dr. Everardo All in 01/2022  At last visit she was on: Hydrocortisone 30 mg daily in am  I recommended to change to: - 25 mg in am - stay on this dose for 1 month, then - 20 mg in am - stay on this dose until you come back  Reviewed previous investigation:   It appears that the 01/2022 set of labs were checked on hydrocortisone, but I am not sure if the rest of the measurements were checked off or on hydrocortisone.  However, I believe that the levels obtained in 2012 were checked before starting hydrocortisone: Component     Latest Ref Rng 05/11/2011 05/12/2011 06/12/2011  Cortisol, Base     ug/dL  0.5.    Cortisol, 30 Min     >20 ug/dL  32.9    Cortisol, 60 Min     >20 ug/dL  51.8    Cortisol - AM     4.3 - 22.4 ug/dL 0.5 (L)     Cortisol, Plasma     ug/dL   6.5     Component     Latest Ref Rng 01/13/2023  21-Hydroxylase Antibodies     NEGATIVE  NEGATIVE   No antiadrenal antibodies.  ROS: + see HPI  Past Medical History:  Diagnosis Date   Adrenal insufficiency, primary (HCC)    Anemia 2018   Hgb 6.8 early 11/2020.  PRBC x 1.  06/2017 PRBC x 1.     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/2021   Stage 4 as of late May 2022   Diabetes mellitus without complication (HCC)    Hypertension    Hypothyroidism    Past Surgical History:  Procedure Laterality Date   CESAREAN SECTION     IR THORACENTESIS ASP PLEURAL SPACE W/IMG GUIDE  06/05/2021   Social History   Socioeconomic History   Marital status: Single     Spouse name: Not on file   Number of children: 5   Years of education: Not on file   Highest education level: High school graduate  Occupational History   Not on file  Tobacco Use   Smoking status: Never  Smokeless tobacco: Never  Vaping Use   Vaping status: Never Used  Substance and Sexual Activity   Alcohol use: No   Drug use: No   Sexual activity: Not on file  Other Topics Concern   Not on file  Social History Narrative   01/06/22 lives with son and his family   Social Determinants of Health   Financial Resource Strain: Not on file  Food Insecurity: No Food Insecurity (03/03/2022)   Hunger Vital Sign    Worried About Running Out of Food in the Last Year: Never true    Ran Out of Food in the Last Year: Never true  Transportation Needs: Not on file  Physical Activity: Not on file  Stress: Not on file  Social Connections: Not on file  Intimate Partner Violence: Not on file   Current Outpatient Medications on File Prior to Visit  Medication Sig Dispense Refill   Accu-Chek Softclix Lancets lancets Use as instructed (Patient not taking: Reported on 05/13/2023) 100 each 12   albuterol (VENTOLIN HFA) 108 (90 Base) MCG/ACT inhaler Inhale into the lungs. (Patient not taking: Reported on 07/07/2023)     amLODipine (NORVASC) 10 MG tablet TAKE 1 TABLET (10 MG TOTAL) BY MOUTH DAILY. 90 tablet 1   aspirin 81 MG chewable tablet Chew 81 mg by mouth daily.     Calcium Carbonate-Vit D-Min (CALCIUM 1200 PO) Take 1,200 mg by mouth daily.     Continuous Glucose Receiver (FREESTYLE LIBRE 3 READER) DEVI 1 Device by Does not apply route daily. Use daily to read sensor for blood sugars (Patient not taking: Reported on 07/07/2023) 1 each 0   Continuous Glucose Sensor (FREESTYLE LIBRE 3 SENSOR) MISC Change Q 2 weeks (Patient not taking: Reported on 07/07/2023) 2 each 6   dicyclomine (BENTYL) 20 MG tablet TAKE 1 TABLET (20 MG TOTAL) BY MOUTH DAILY AS NEEDED FOR SPASMS. 30 tablet 1   ezetimibe (ZETIA) 10  MG tablet Take 1 tablet (10 mg total) by mouth daily. (Patient not taking: Reported on 07/07/2023) 90 tablet 3   Glucagon 3 MG/DOSE POWD Place 3 mg into the nose once as needed for up to 1 dose. 1 each 11   hydrALAZINE (APRESOLINE) 25 MG tablet Take 1 tablet (25 mg total) by mouth 3 (three) times daily. 90 tablet 5   hydrocortisone (CORTEF) 10 MG tablet Take 2-3 tablets (20-30 mg total) by mouth daily. 300 tablet 3   insulin aspart (NOVOLOG FLEXPEN) 100 UNIT/ML FlexPen 2 units subcut before meals.  Hold if BS before meal less than 180 15 mL 11   insulin glargine (LANTUS SOLOSTAR) 100 UNIT/ML Solostar Pen Inject 6 Units into the skin at bedtime. 15 mL PRN   Insulin Pen Needle (PEN NEEDLES) 31G X 8 MM MISC UAD (Patient not taking: Reported on 07/07/2023) 100 each 6   isosorbide mononitrate (IMDUR) 60 MG 24 hr tablet TAKE 1 TABLET BY MOUTH DAILY 90 tablet 1   levothyroxine (SYNTHROID) 150 MCG tablet Take 1 tablet (150 mcg total) by mouth daily before breakfast. 45 tablet 3   Multiple Vitamin (MULTIVITAMIN WITH MINERALS) TABS tablet Take 1 tablet by mouth daily.     omeprazole (PRILOSEC) 40 MG capsule Take 1 capsule (40 mg total) by mouth daily. 90 capsule 1   ondansetron (ZOFRAN-ODT) 4 MG disintegrating tablet TAKE 1 TABLET BY MOUTH EVERY 8 HOURS AS NEEDED FOR NAUSEA AND VOMITING 20 tablet 0   polyethylene glycol (MIRALAX / GLYCOLAX) 17 g packet Take 17 g by  mouth daily. 30 each 0   sertraline (ZOLOFT) 50 MG tablet TAKE 1 TABLET BY MOUTH EVERY DAY 90 tablet 0   torsemide (DEMADEX) 20 MG tablet TAKE 1 TABLET BY MOUTH DAILY AS NEEDED (FOR WEIGHT GAIN OF 3LBS IN 1 DAY OR 5 LBS IN 2 DAYS.). 90 tablet 1   traMADol (ULTRAM) 50 MG tablet Take 1 tablet (50 mg total) by mouth 2 (two) times daily as needed. 60 tablet 0   vitamin B-12 (CYANOCOBALAMIN) 1000 MCG tablet Take 1 tablet (1,000 mcg total) by mouth daily. 30 tablet 0   No current facility-administered medications on file prior to visit.   Allergies   Allergen Reactions   Beef-Derived Products     RELIGOUS  REASONS   Pork-Derived Products     RELIGIOUS REASONS   Percocet [Oxycodone-Acetaminophen] Rash   Family History  Problem Relation Age of Onset   Diabetes Mother    Hyperlipidemia Father    Hypertension Father    Adrenal disorder Neg Hx    Colon polyps Neg Hx    Colon cancer Neg Hx    Esophageal cancer Neg Hx    Rectal cancer Neg Hx    Stomach cancer Neg Hx     PE: There were no vitals taken for this visit. Wt Readings from Last 3 Encounters:  07/07/23 113 lb (51.3 kg)  05/13/23 113 lb (51.3 kg)  01/13/23 116 lb 3.2 oz (52.7 kg)   Constitutional: Thin, in NAD Eyes:  EOMI, no exophthalmos ENT: no neck masses, no cervical lymphadenopathy Cardiovascular: RRR, No MRG, + mild periankle swelling B Respiratory: CTA B Musculoskeletal: no deformities Skin:no rashes Neurological: + mild tremor with outstretched hands  ASSESSMENT: 1. DM, non-insulin-dependent, uncontrolled, with complications - CHF - Aortic atherosclerosis - CKD stage III-IV  Component     Latest Ref Rng 01/13/2023  Glucose Fasting, POC     70 - 99 mg/dL 469 !   Glutamic Acid Decarb Ab     <5 IU/mL <5   ZNT8 Antibodies     <15 U/mL <10   IA-2 Antibody     <5.4 U/mL <5.4   Insulin Antibodies, Human     <0.4 U/mL <0.4   C-Peptide     0.80 - 3.85 ng/mL 1.29    2.  Hypothyroidism  3.  Adrenal insufficiency  4. HL - has FH of this   PLAN:  1.  Complex patient with longstanding, uncontrolled diabetes, not on any diabetic medications at last visit, but reportedly with blood sugars at goal.  I advised her to start checking and I sent a prescription for the freestyle libre 3 CGM to her pharmacy.  Her daughter was wearing one and could show her how to use it.  I did not recommend to start medications based on the blood sugars at home.  Patient and her daughter were also determined to improve her diet, which she had relaxed.  We checked her for  insulin deficiency and pancreatic autoimmunity and the investigation was negative.  Due to low body habitus and hypoglycemic values, a GLP-1 receptor agonist was not ideal. -However, since then, she saw PCP in 04/2023 and an HbA1c was undetectably high.  At that time, she was started on insulin-basal/bolus regimen.  -I advised her to: Patient Instructions  Try to change the test strips.  Please try to decrease the hydrocortisone dose to: - 25 mg in am - stay on this dose for 1 month, then - 20 mg in am -  stay on this dose until you come back  Please decrease levothyroxine dose 150 mcg daily.  Take the thyroid hormone every day, with water, at least 30 minutes before breakfast, separated by at least 4 hours from: - acid reflux medications - calcium - iron - multivitamins  Please come back for labs in 1.5 months to check the thyroid dose.  Regarding hydrocortisone: - You absolutely need to take this medication every day and not skip doses. - Please double the dose if you have a fever, for the duration of the fever. - If you cannot take anything by mouth (vomiting) or you have severe diarrhea so that you eliminate the hydrocortisone pills in your stool, please make sure that you get the hydrocortisone in the vein instead - go to the nearest emergency department/urgent care or you may go to your PCPs office  - Please try to get a MedAlert bracelet or pendant indicating: "Adrenal insufficiency".   Please stop at the lab.  Please continue: - Ezetimibe 10 mg daily.  Please return in 3 months with your sugar log.   - we checked her HbA1c: 7%  - advised to check sugars at different times of the day - 4x a day, rotating check times - advised for yearly eye exams >> she is UTD - return to clinic in 3 months  2.  Hypothyroidism -Before last visit, TSH was quite low, so I advised her to decrease the dose of her levothyroxine. -Latest TSH reviewed and this was even lower: Lab Results   Component Value Date   TSH 0.05 (L) 03/02/2023  - she continues on LT4 175 mcg daily (did not decrease the dose to 150 mcg daily, as advised...) - pt feels good on this dose. - we discussed about taking the thyroid hormone every day, with water, >30 minutes before breakfast, separated by >4 hours from acid reflux medications, calcium, iron, multivitamins. Pt. is taking it correctly. - will check thyroid tests today: TSH and fT4 - If labs are abnormal, she will need to return for repeat TFTs in 1.5 months  3.  Adrenal insufficiency -Patient with history of primary adrenal insufficiency per review of Dr. George Hugh notes.  This was diagnosed in 2012.  She was initially started on hydrocortisone and she continued to this for 10 years.  She was briefly off the medication after cortisol levels were normal, but had to restart in 2022.  Reviewing the labs from 2012, she had a low a.m. cortisol, but it stimulated to 15, which, per today's standards is considered normal.  I do not have an ACTH from 2012, but this was low in 2022.  However, it is unclear whether it was checked off hydrocortisone at that time.  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  At last visit, we checked 21-hydroxylase antibodies and these were not dated. -At last visit she was taking 30 mg of hydrocortisone daily.  We discussed that this was high dose for her BMI and discussed about decreasing the dose slowly, to the minimum dose that allows her to feel well: - 25 mg in am - stay on this dose for 1 month, then - 20 mg in am - stay on this dose until you come back -At today's visit, she is on ** mg of hydrocortisone daily -No fatigue, weight loss, but she has nausea which is chronic.  She also has headaches.  At last visit I advised her to avoid further nausea. -At last visit, she was taking 1  dose of hydrocortisone daily, rather than a split dose, but we continued with this -We again discussed about sick day rules today: - You absolutely need to take this medication every day and not skip doses. - Please double the dose if you have a fever, for the duration of the fever. - If you cannot take anything by mouth (vomiting) or you have severe diarrhea so that you eliminate the hydrocortisone pills in your stool, please make sure that you get the hydrocortisone in the vein instead - go to the nearest emergency department/urgent care or you may go to your PCPs office  - Please try to get a MedAlert bracelet or pendant indicating: "Adrenal insufficiency".  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

## 2023-09-11 ENCOUNTER — Encounter: Payer: Self-pay | Admitting: Internal Medicine

## 2023-09-22 ENCOUNTER — Other Ambulatory Visit: Payer: Self-pay | Admitting: Internal Medicine

## 2023-09-22 ENCOUNTER — Encounter: Payer: Self-pay | Admitting: Internal Medicine

## 2023-09-22 DIAGNOSIS — I129 Hypertensive chronic kidney disease with stage 1 through stage 4 chronic kidney disease, or unspecified chronic kidney disease: Secondary | ICD-10-CM

## 2023-09-22 DIAGNOSIS — I10 Essential (primary) hypertension: Secondary | ICD-10-CM

## 2023-09-22 NOTE — Telephone Encounter (Signed)
She needs a new visit for further refills

## 2023-09-22 NOTE — Telephone Encounter (Signed)
Requested Prescriptions  Pending Prescriptions Disp Refills   torsemide (DEMADEX) 20 MG tablet [Pharmacy Med Name: TORSEMIDE 20 MG TABLET] 90 tablet 0    Sig: TAKE 1 TABLET BY MOUTH DAILY AS NEEDED (FOR WEIGHT GAIN OF 3LBS IN 1 DAY OR 5 LBS IN 2 DAYS.).     Cardiovascular:  Diuretics - Loop Failed - 09/22/2023 12:12 PM      Failed - K in normal range and within 180 days    Potassium  Date Value Ref Range Status  01/12/2023 4.3 3.5 - 5.2 mmol/L Final         Failed - Ca in normal range and within 180 days    Calcium  Date Value Ref Range Status  01/12/2023 9.7 8.7 - 10.3 mg/dL Final   Calcium, Ion  Date Value Ref Range Status  06/12/2011 1.22 1.12 - 1.32 mmol/L Final         Failed - Na in normal range and within 180 days    Sodium  Date Value Ref Range Status  01/12/2023 141 134 - 144 mmol/L Final         Failed - Cr in normal range and within 180 days    Creatinine  Date Value Ref Range Status  07/14/2021 86.1 20.0 - 300.0 mg/dL Final   Creatinine, Ser  Date Value Ref Range Status  01/12/2023 1.72 (H) 0.57 - 1.00 mg/dL Final         Failed - Cl in normal range and within 180 days    Chloride  Date Value Ref Range Status  01/12/2023 104 96 - 106 mmol/L Final         Failed - Mg Level in normal range and within 180 days    Magnesium  Date Value Ref Range Status  05/16/2021 2.3 1.7 - 2.4 mg/dL Final    Comment:    Performed at [facility], [address] [place], [place] [address]         Failed - Last BP in normal range    BP Readings from Last 1 Encounters:  05/13/23 (!) 228/74         Passed - Valid encounter within last 6 months    Recent Outpatient Visits           4 months ago Type 2 diabetes mellitus with hyperglycemia, with long-term current use of insulin [facility])   [place] [facility] Marcine Matar, MD   8 months ago Other fatigue   [place] [facility] [place], [place], [place]   1  year ago Abnormal LFTs   [place] [facility] Jonah Blue B, MD   1 year ago Type 2 diabetes mellitus with hypoglycemia without coma, without long-term current use of insulin [facility])   [place] [facility] Jonah Blue B, MD   1 year ago Diabetes mellitus type 2, noninsulin dependent [facility])   [place] [facility] Jonah Blue B, MD               hydrALAZINE (APRESOLINE) 10 MG tablet [Pharmacy Med Name: HYDRALAZINE 10 MG TABLET] 405 tablet 0    Sig: TAKE 1.5 TABLETS (15 MG TOTAL) BY MOUTH 3 TIMES A DAY     Cardiovascular:  Vasodilators Failed - 09/22/2023 12:12 PM      Failed - HCT in normal range and within 360 days    Hematocrit  Date Value Ref Range Status  01/12/2023 33.3 (L) 34.0 - 46.6 % Final         Failed - HGB in normal range and within 360 days    Hemoglobin  Date Value Ref Range Status  01/12/2023 11.0 (L) 11.1 - 15.9 g/dL Final         Failed - RBC in normal range and within 360 days    RBC  Date Value Ref Range Status  01/12/2023 3.70 (L) 3.77 - 5.28 x10E6/uL Final  02/14/2022 3.09 (L) 3.87 - 5.11 MIL/uL Final         Failed - ANA Screen, Ifa, Serum in normal range and within 360 days    Anti Nuclear Antibody(ANA)  Date Value Ref Range Status  05/11/2011 NEGATIVE NEGATIVE Final         Failed - Last BP in normal range    BP Readings from Last 1 Encounters:  05/13/23 (!) 228/74         Passed - WBC in normal range and within 360 days    WBC  Date Value Ref Range Status  01/12/2023 8.1 3.4 - 10.8 x10E3/uL Final  02/14/2022 9.1 4.0 - 10.5 K/uL Final         Passed - PLT in normal range and within 360 days    Platelets  Date Value Ref Range Status  01/12/2023 164 150 - 450 x10E3/uL Final         Passed - Valid encounter within last 12 months    Recent Outpatient Visits           4 months ago Type 2 diabetes mellitus with hyperglycemia, with long-term current  use of insulin [facility])   [place] [facility] Marcine Matar, MD   8 months ago Other fatigue   [place] [facility], [place], [place]   1 year ago Abnormal LFTs   [place] [facility] Jonah Blue B, MD   1 year ago Type 2 diabetes mellitus with hypoglycemia without coma, without long-term current use of insulin [facility])   [place] [facility] Marcine Matar, MD   1 year ago Diabetes mellitus type 2, noninsulin dependent [facility])   [place] [facility] Marcine Matar, MD

## 2023-09-23 ENCOUNTER — Other Ambulatory Visit: Payer: Self-pay | Admitting: Internal Medicine

## 2023-09-23 DIAGNOSIS — Z1211 Encounter for screening for malignant neoplasm of colon: Secondary | ICD-10-CM

## 2023-09-23 DIAGNOSIS — Z1212 Encounter for screening for malignant neoplasm of rectum: Secondary | ICD-10-CM

## 2023-09-27 MED ORDER — HYDROCORTISONE 10 MG PO TABS: 20.0000 mg | ORAL_TABLET | Freq: Every day | ORAL | 3 refills | Status: DC

## 2023-10-08 ENCOUNTER — Encounter (HOSPITAL_BASED_OUTPATIENT_CLINIC_OR_DEPARTMENT_OTHER): Payer: Self-pay

## 2023-10-08 ENCOUNTER — Emergency Department (HOSPITAL_BASED_OUTPATIENT_CLINIC_OR_DEPARTMENT_OTHER): Payer: Medicaid Other | Admitting: Radiology

## 2023-10-08 ENCOUNTER — Other Ambulatory Visit: Payer: Self-pay

## 2023-10-08 DIAGNOSIS — Z7982 Long term (current) use of aspirin: Secondary | ICD-10-CM | POA: Diagnosis not present

## 2023-10-08 DIAGNOSIS — R110 Nausea: Secondary | ICD-10-CM | POA: Diagnosis not present

## 2023-10-08 DIAGNOSIS — R0602 Shortness of breath: Secondary | ICD-10-CM | POA: Insufficient documentation

## 2023-10-08 DIAGNOSIS — R059 Cough, unspecified: Secondary | ICD-10-CM | POA: Insufficient documentation

## 2023-10-08 DIAGNOSIS — Z1152 Encounter for screening for COVID-19: Secondary | ICD-10-CM | POA: Diagnosis not present

## 2023-10-08 DIAGNOSIS — Z794 Long term (current) use of insulin: Secondary | ICD-10-CM | POA: Diagnosis not present

## 2023-10-08 DIAGNOSIS — J40 Bronchitis, not specified as acute or chronic: Secondary | ICD-10-CM | POA: Diagnosis not present

## 2023-10-08 DIAGNOSIS — I10 Essential (primary) hypertension: Secondary | ICD-10-CM | POA: Diagnosis not present

## 2023-10-08 DIAGNOSIS — R509 Fever, unspecified: Secondary | ICD-10-CM | POA: Diagnosis not present

## 2023-10-08 DIAGNOSIS — R918 Other nonspecific abnormal finding of lung field: Secondary | ICD-10-CM | POA: Diagnosis not present

## 2023-10-08 LAB — COMPREHENSIVE METABOLIC PANEL
ALT: 10 U/L (ref 0–44)
AST: 16 U/L (ref 15–41)
Albumin: 4.1 g/dL (ref 3.5–5.0)
Alkaline Phosphatase: 79 U/L (ref 38–126)
Anion gap: 8 (ref 5–15)
BUN: 16 mg/dL (ref 8–23)
CO2: 23 mmol/L (ref 22–32)
Calcium: 9.3 mg/dL (ref 8.9–10.3)
Chloride: 100 mmol/L (ref 98–111)
Creatinine, Ser: 1.53 mg/dL — ABNORMAL HIGH (ref 0.44–1.00)
GFR, Estimated: 38 mL/min — ABNORMAL LOW (ref >60)
Glucose, Bld: 333 mg/dL — ABNORMAL HIGH (ref 70–99)
Potassium: 4.5 mmol/L (ref 3.5–5.1)
Sodium: 131 mmol/L — ABNORMAL LOW (ref 135–145)
Total Bilirubin: 0.4 mg/dL (ref 0.3–1.2)
Total Protein: 7.7 g/dL (ref 6.5–8.1)

## 2023-10-08 LAB — CBC WITH DIFFERENTIAL/PLATELET
Abs Immature Granulocytes: 0.02 10*3/uL (ref 0.00–0.07)
Basophils Absolute: 0.1 10*3/uL (ref 0.0–0.1)
Basophils Relative: 1 %
Eosinophils Absolute: 0.1 10*3/uL (ref 0.0–0.5)
Eosinophils Relative: 2 %
HCT: 31.7 % — ABNORMAL LOW (ref 36.0–46.0)
Hemoglobin: 10.9 g/dL — ABNORMAL LOW (ref 12.0–15.0)
Immature Granulocytes: 0 %
Lymphocytes Relative: 33 %
Lymphs Abs: 2 10*3/uL (ref 0.7–4.0)
MCH: 28.7 pg (ref 26.0–34.0)
MCHC: 34.4 g/dL (ref 30.0–36.0)
MCV: 83.4 fL (ref 80.0–100.0)
Monocytes Absolute: 0.3 10*3/uL (ref 0.1–1.0)
Monocytes Relative: 4 %
Neutro Abs: 3.6 10*3/uL (ref 1.7–7.7)
Neutrophils Relative %: 60 %
Platelets: 221 10*3/uL (ref 150–400)
RBC: 3.8 MIL/uL — ABNORMAL LOW (ref 3.87–5.11)
RDW: 13.2 % (ref 11.5–15.5)
WBC: 6.1 10*3/uL (ref 4.0–10.5)
nRBC: 0 % (ref 0.0–0.2)

## 2023-10-08 LAB — URINALYSIS, W/ REFLEX TO CULTURE (INFECTION SUSPECTED)
Bacteria, UA: NONE SEEN
Bilirubin Urine: NEGATIVE
Glucose, UA: 500 mg/dL — AB
Hgb urine dipstick: NEGATIVE
Ketones, ur: NEGATIVE mg/dL
Nitrite: NEGATIVE
Protein, ur: NEGATIVE mg/dL
Specific Gravity, Urine: 1.014 (ref 1.005–1.030)
pH: 5.5 (ref 5.0–8.0)

## 2023-10-08 LAB — RESP PANEL BY RT-PCR (RSV, FLU A&B, COVID)  RVPGX2
Influenza A by PCR: NEGATIVE
Influenza B by PCR: NEGATIVE
Resp Syncytial Virus by PCR: NEGATIVE
SARS Coronavirus 2 by RT PCR: NEGATIVE

## 2023-10-08 NOTE — ED Triage Notes (Signed)
Pt presents with cough, fatigue, nausea x 1 month and pt started having a fever recently. Pt stated getting ill right after her father passed away.

## 2023-10-09 ENCOUNTER — Encounter: Payer: Self-pay | Admitting: Internal Medicine

## 2023-10-09 ENCOUNTER — Emergency Department (HOSPITAL_BASED_OUTPATIENT_CLINIC_OR_DEPARTMENT_OTHER)
Admission: EM | Admit: 2023-10-09 | Discharge: 2023-10-09 | Disposition: A | Discharge status: Home / Self Care | Payer: Medicaid Other | Attending: Emergency Medicine | Admitting: Emergency Medicine

## 2023-10-09 ENCOUNTER — Other Ambulatory Visit: Payer: Self-pay

## 2023-10-09 DIAGNOSIS — J40 Bronchitis, not specified as acute or chronic: Secondary | ICD-10-CM

## 2023-10-09 DIAGNOSIS — I10 Essential (primary) hypertension: Secondary | ICD-10-CM

## 2023-10-09 LAB — TROPONIN I (HIGH SENSITIVITY): Troponin I (High Sensitivity): 11 ng/L (ref <18)

## 2023-10-09 LAB — BRAIN NATRIURETIC PEPTIDE: B Natriuretic Peptide: 101.8 pg/mL — ABNORMAL HIGH (ref 0.0–100.0)

## 2023-10-09 LAB — GROUP A STREP BY PCR: Group A Strep by PCR: NOT DETECTED

## 2023-10-09 MED ORDER — AMLODIPINE BESYLATE 5 MG PO TABS
10.0000 mg | ORAL_TABLET | Freq: Once | ORAL | Status: AC
  Administered 2023-10-09: 10 mg via ORAL
  Filled 2023-10-09: qty 2

## 2023-10-09 MED ORDER — BENZONATATE 100 MG PO CAPS: 100.0000 mg | ORAL_CAPSULE | Freq: Three times a day (TID) | ORAL | 0 refills | Status: DC

## 2023-10-09 MED ORDER — CLONIDINE HCL 0.1 MG PO TABS
0.1000 mg | ORAL_TABLET | Freq: Once | ORAL | Status: AC
  Administered 2023-10-09: 0.1 mg via ORAL
  Filled 2023-10-09: qty 1

## 2023-10-09 MED ORDER — DOXYCYCLINE HYCLATE 100 MG PO CAPS: 100.0000 mg | ORAL_CAPSULE | Freq: Two times a day (BID) | ORAL | 0 refills | Status: DC

## 2023-10-09 MED ORDER — AEROCHAMBER PLUS FLO-VU MISC
1.0000 | Freq: Once | Status: DC
  Filled 2023-10-09: qty 1

## 2023-10-09 MED ORDER — ALBUTEROL SULFATE HFA 108 (90 BASE) MCG/ACT IN AERS: 1.0000 | INHALATION_SPRAY | Freq: Four times a day (QID) | RESPIRATORY_TRACT | 0 refills | Status: DC | PRN

## 2023-10-09 MED ORDER — ALBUTEROL SULFATE HFA 108 (90 BASE) MCG/ACT IN AERS
2.0000 | INHALATION_SPRAY | Freq: Once | RESPIRATORY_TRACT | Status: AC
  Administered 2023-10-09: 2 via RESPIRATORY_TRACT
  Filled 2023-10-09: qty 6.7

## 2023-10-09 NOTE — Discharge Instructions (Signed)
Take the antibiotics as prescribed.  Follow-up with your doctor.  Your blood pressure today is elevated and you should follow-up with your doctor for medication adjustments.  Return to the ED with exertional chest pain, shortness of breath, nausea, vomiting, sweating or other concerns.

## 2023-10-09 NOTE — ED Notes (Signed)
RT had pt perform O2 challenge. Pt respiratory status stable during walk and after walk w/no distress sats 100%.    10/09/23 0256  Oxygen Therapy/Pulse Ox  O2 Device Room Air  O2 Therapy Room air  SpO2 99 % (post O2 challenge)

## 2023-10-09 NOTE — ED Provider Notes (Signed)
[place] EMERGENCY DEPARTMENT AT [facility] Provider Note   CSN: [id number] Arrival date & time: 10/08/23  1938     History  Chief Complaint  Patient presents with   Fever    [name] is a 62 y.o. female.  Patient with history of diabetes, adrenal insufficiency, hypertension, CKD, hypothyroidism, diastolic CHF presenting with a 1 month history of cough and scratchy throat.  Symptoms seem to exacerbate after her father's death last month.  She has been coughing nonstop but not productive.  Seems to be worse at night.  Has been using albuterol at home without relief.  Nothing coming up when she coughs.  Some associated scratchy throat.  Tmax 100 2 days ago.  Multiple sick contacts at home.  No chest pain except with coughing.  No abdominal pain, nausea, vomiting, leg pain or leg swelling.  No significant headache.  No pain with urination or blood in the urine.  Denies history of asthma or COPD.  Not a smoker. Shortness of breath and coughing are worse with lying flat.  Not exertional or pleuritic.  Blood pressure elevated.  Uncertain if she took her amlodipine dose tonight.  The history is provided by the patient and a relative.  Fever Associated symptoms: congestion, cough and sore throat   Associated symptoms: no chest pain, no dysuria, no headaches, no myalgias, no nausea, no rash and no vomiting        Home Medications Prior to Admission medications   Medication Sig Start Date End Date Taking? Authorizing Provider  Accu-Chek Softclix Lancets lancets Use as instructed Patient not taking: Reported on 05/13/2023 11/23/21   Marcine Matar, MD  albuterol (VENTOLIN HFA) 108 (90 Base) MCG/ACT inhaler Inhale into the lungs. Patient not taking: Reported on 07/07/2023 03/08/22   [provider]  amLODipine (NORVASC) 10 MG tablet TAKE 1 TABLET (10 MG TOTAL) BY MOUTH DAILY. 05/13/23 05/12/24  Marcine Matar, MD  aspirin 81 MG chewable tablet Chew 81 mg by  mouth daily.    [provider]  Calcium Carbonate-Vit D-Min (CALCIUM 1200 PO) Take 1,200 mg by mouth daily.    [provider]  Continuous Glucose Receiver (FREESTYLE LIBRE 3 READER) DEVI 1 Device by Does not apply route daily. Use daily to read sensor for blood sugars Patient not taking: Reported on 07/07/2023 05/19/23   Marcine Matar, MD  Continuous Glucose Sensor (FREESTYLE LIBRE 3 SENSOR) MISC Change Q 2 weeks Patient not taking: Reported on 07/07/2023 05/19/23   Marcine Matar, MD  dicyclomine (BENTYL) 20 MG tablet TAKE 1 TABLET (20 MG TOTAL) BY MOUTH DAILY AS NEEDED FOR SPASMS. 05/03/22   Marcine Matar, MD  ezetimibe (ZETIA) 10 MG tablet Take 1 tablet (10 mg total) by mouth daily. Patient not taking: Reported on 07/07/2023 01/13/23   Carlus Pavlov, MD  Glucagon 3 MG/DOSE POWD Place 3 mg into the nose once as needed for up to 1 dose. 01/13/23   Carlus Pavlov, MD  hydrALAZINE (APRESOLINE) 25 MG tablet Take 1 tablet (25 mg total) by mouth 3 (three) times daily. 05/13/23   Marcine Matar, MD  hydrocortisone (CORTEF) 10 MG tablet Take 2-3 tablets (20-30 mg total) by mouth daily. 09/27/23   Carlus Pavlov, MD  insulin aspart (NOVOLOG FLEXPEN) 100 UNIT/ML FlexPen 2 units subcut before meals.  Hold if BS before meal less than 180 05/13/23   Marcine Matar, MD  insulin glargine (LANTUS SOLOSTAR) 100 UNIT/ML Solostar Pen Inject 6 Units into  the skin at bedtime. 05/13/23   Marcine Matar, MD  Insulin Pen Needle (PEN NEEDLES) 31G X 8 MM MISC UAD Patient not taking: Reported on 07/07/2023 05/13/23   Marcine Matar, MD  isosorbide mononitrate (IMDUR) 60 MG 24 hr tablet TAKE 1 TABLET BY MOUTH DAILY 05/13/23 05/12/24  Marcine Matar, MD  levothyroxine (SYNTHROID) 150 MCG tablet TAKE 1 TABLET BY MOUTH DAILY BEFORE BREAKFAST. 09/22/23   Carlus Pavlov, MD  Multiple Vitamin (MULTIVITAMIN WITH MINERALS) TABS tablet Take 1 tablet by mouth daily.    [provider]  omeprazole (PRILOSEC) 40 MG capsule Take 1 capsule (40 mg total) by mouth daily. 05/13/23   Marcine Matar, MD  ondansetron (ZOFRAN-ODT) 4 MG disintegrating tablet TAKE 1 TABLET BY MOUTH EVERY 8 HOURS AS NEEDED FOR NAUSEA AND VOMITING 07/08/23   Marcine Matar, MD  polyethylene glycol (MIRALAX / GLYCOLAX) 17 g packet Take 17 g by mouth daily. 05/17/21   Ghimire, Werner Lean, MD  sertraline (ZOLOFT) 50 MG tablet TAKE 1 TABLET BY MOUTH EVERY DAY 06/24/23   Marcine Matar, MD  torsemide (DEMADEX) 20 MG tablet TAKE 1 TABLET BY MOUTH DAILY AS NEEDED (FOR WEIGHT GAIN OF 3LBS IN 1 DAY OR 5 LBS IN 2 DAYS.). 09/22/23   Marcine Matar, MD  traMADol (ULTRAM) 50 MG tablet Take 1 tablet (50 mg total) by mouth 2 (two) times daily as needed. 05/13/23   Marcine Matar, MD  vitamin B-12 (CYANOCOBALAMIN) 1000 MCG tablet Take 1 tablet (1,000 mcg total) by mouth daily. 05/16/21   Ghimire, Werner Lean, MD      Allergies    Beef-derived products, Pork-derived products, and Percocet [oxycodone-acetaminophen]    Review of Systems   Review of Systems  Constitutional:  Positive for fever. Negative for activity change and appetite change.  HENT:  Positive for congestion and sore throat.   Respiratory:  Positive for cough. Negative for chest tightness and shortness of breath.   Cardiovascular:  Negative for chest pain and leg swelling.  Gastrointestinal:  Negative for abdominal pain, nausea and vomiting.  Genitourinary:  Negative for dysuria and hematuria.  Musculoskeletal:  Negative for arthralgias and myalgias.  Skin:  Negative for rash.  Neurological:  Negative for dizziness, weakness and headaches.   all other systems are negative except as noted in the HPI and PMH.    Physical Exam Updated Vital Signs BP (!) 237/78 (BP Location: Left Arm)   Pulse 74   Temp 97.9 F (36.6 C) (Oral)   Resp 20   Ht 5\' 4"  (1.626 m)   Wt 51.7 kg   SpO2 100%   BMI 19.57 kg/m  Physical Exam Vitals  and nursing note reviewed.  Constitutional:      General: She is not in acute distress.    Appearance: She is well-developed.     Comments: Speaking in full sentences, no distress  HENT:     Head: Normocephalic and atraumatic.     Mouth/Throat:     Pharynx: No oropharyngeal exudate.     Comments: Oropharynx is clear.  No uvular edema.  No tongue swelling. Eyes:     Conjunctiva/sclera: Conjunctivae normal.     Pupils: Pupils are equal, round, and reactive to light.  Neck:     Comments: No meningismus. Cardiovascular:     Rate and Rhythm: Normal rate and regular rhythm.     Heart sounds: Normal heart sounds. No murmur heard. Pulmonary:     Effort:  Pulmonary effort is normal. No respiratory distress.     Breath sounds: Normal breath sounds. No wheezing.  Chest:     Chest wall: No tenderness.  Abdominal:     Palpations: Abdomen is soft.     Tenderness: There is no abdominal tenderness. There is no guarding or rebound.  Musculoskeletal:        General: No tenderness. Normal range of motion.     Cervical back: Normal range of motion and neck supple.     Right lower leg: No edema.     Left lower leg: No edema.  Skin:    General: Skin is warm.  Neurological:     Mental Status: She is alert and oriented to person, place, and time.     Cranial Nerves: No cranial nerve deficit.     Motor: No abnormal muscle tone.     Coordination: Coordination normal.     Comments:  5/5 strength throughout. CN 2-12 intact.Equal grip strength.   Psychiatric:        Behavior: Behavior normal.     ED Results / Procedures / Treatments   Labs (all labs ordered are listed, but only abnormal results are displayed) Labs Reviewed  COMPREHENSIVE METABOLIC PANEL - Abnormal; Notable for the following components:      Result Value   Sodium 131 (*)    Glucose, Bld 333 (*)    Creatinine, Ser 1.53 (*)    GFR, Estimated 38 (*)    All other components within normal limits  CBC WITH DIFFERENTIAL/PLATELET -  Abnormal; Notable for the following components:   RBC 3.80 (*)    Hemoglobin 10.9 (*)    HCT 31.7 (*)    All other components within normal limits  URINALYSIS, W/ REFLEX TO CULTURE (INFECTION SUSPECTED) - Abnormal; Notable for the following components:   Glucose, UA 500 (*)    Leukocytes,Ua SMALL (*)    All other components within normal limits  BRAIN NATRIURETIC PEPTIDE - Abnormal; Notable for the following components:   B Natriuretic Peptide 101.8 (*)    All other components within normal limits  RESP PANEL BY RT-PCR (RSV, FLU A&B, COVID)  [id number]  GROUP A STREP BY PCR  TROPONIN I (HIGH SENSITIVITY)  TROPONIN I (HIGH SENSITIVITY)    EKG None  Radiology DG Chest 2 View  Result Date: 10/08/2023 CLINICAL DATA:  Shortness of breath. 10026. coughing, fatigue and nausea for 1 month with recent fever. EXAM: CHEST - 2 VIEW COMPARISON:  Portable chest 12/10/2021. FINDINGS: Mild cardiomegaly. No vascular congestion is seen. There is a low inspiration. Increased perihilar interstitial lung markings are noted and could be due to bronchitis or low inspiration. No focal pneumonia is evident, no pleural effusion. The mediastinum is normally outlined. Mild thoracic kyphodextroscoliosis. IMPRESSION: 1. Increased perihilar interstitial lung markings which could be due to bronchitis or low inspiration. No focal pneumonia is seen. 2. Mild cardiomegaly. Electronically Signed   By: Almira Bar M.D.   On: 10/08/2023 22:47    Procedures Procedures    Medications Ordered in ED Medications  albuterol (VENTOLIN HFA) 108 (90 Base) MCG/ACT inhaler 2 puff (has no administration in time range)  amLODipine (NORVASC) tablet 10 mg (has no administration in time range)    ED Course/ Medical Decision Making/ A&P                                 Medical Decision Making Amount  and/or Complexity of Data Reviewed Independent Historian: caregiver Labs: ordered. Decision-making details documented in ED  Course. Radiology: ordered and independent interpretation performed. Decision-making details documented in ED Course. ECG/medicine tests: ordered and independent interpretation performed. Decision-making details documented in ED Course.  Risk Prescription drug management.   1 month history of cough and scratchy throat.  Denies shortness of breath or chest pain.  Worse with lying flat.  No hypoxia or increased work of breathing.  Diminished breath sounds bilaterally without wheezing.  Hypertensive, did not take her medications this evening.  Chart review shows history of difficult to control hypertension with frequent elevated levels over 200 systolic at office visits  Labs show hyperglycemia without DKA.  Low suspicion for ACS.  Chest x-ray shows bronchitic changes.  Results reviewed interpreted by me.  No pneumonia  Creatinine is at baseline.  Patient able to ambulate without desaturation.  COVID and flu testing is negative.  Labs show hyperglycemia without DKA.  Creatinine is at baseline.  Troponin negative with low suspicion for ACS.  No hypoxia or tachycardia.  No increased work of breathing.  Blood pressure has improved after receiving home medications.  Will treat with doxycycline for suspected bronchitis.  Use albuterol every 6 hours as needed for difficulty breathing and coughing.  Will also give cough suppressant.  Return to the ED sooner with exertional chest pain, pain associate with shortness of breath, nausea, vomiting, sweating or other concerns        Final Clinical Impression(s) / ED Diagnoses Final diagnoses:  None    Rx / DC Orders ED Discharge Orders     None         Damar Petit, Jeannett Senior, MD 10/09/23 [phone]

## 2023-10-09 NOTE — ED Notes (Signed)
RT educated pt on proper use of MDI w/spacer. Pt able to perform w/out difficulty with modified technique.    10/09/23 0247  Aerosol Therapy Tx  $ Hand Held Nebulizer  1  Medications Albuterol  Delivery Device MDI (w/spacer)  Pre-Treatment Pulse 65  Pre-Treatment Respirations 20  Treatment Tolerance Tolerated well  Treatment Given 1  RT Breath Sounds  Bilateral Breath Sounds Clear  R Upper  Breath Sounds Clear  L Upper Breath Sounds Clear  R Lower Breath Sounds Clear  L Lower Breath Sounds Clear  Oxygen Therapy/Pulse Ox  O2 Device Room Air  O2 Therapy Room air

## 2023-10-10 ENCOUNTER — Other Ambulatory Visit: Payer: Self-pay | Admitting: Internal Medicine

## 2023-10-10 DIAGNOSIS — M170 Bilateral primary osteoarthritis of knee: Secondary | ICD-10-CM

## 2023-10-10 MED ORDER — TRAMADOL HCL 50 MG PO TABS
50.0000 mg | ORAL_TABLET | Freq: Two times a day (BID) | ORAL | 0 refills | Status: DC | PRN
Start: 2023-10-10 — End: 2024-04-26

## 2023-10-10 MED ORDER — LEVOTHYROXINE SODIUM 150 MCG PO TABS: 150.0000 ug | ORAL_TABLET | Freq: Every day | ORAL | 0 refills | Status: DC

## 2023-10-21 DIAGNOSIS — Z419 Encounter for procedure for purposes other than remedying health state, unspecified: Secondary | ICD-10-CM | POA: Diagnosis not present

## 2023-11-20 DIAGNOSIS — Z419 Encounter for procedure for purposes other than remedying health state, unspecified: Secondary | ICD-10-CM | POA: Diagnosis not present

## 2023-12-21 DIAGNOSIS — Z419 Encounter for procedure for purposes other than remedying health state, unspecified: Secondary | ICD-10-CM | POA: Diagnosis not present

## 2024-01-06 ENCOUNTER — Other Ambulatory Visit: Payer: Medicaid Other

## 2024-01-12 ENCOUNTER — Other Ambulatory Visit: Payer: Self-pay | Admitting: Internal Medicine

## 2024-01-12 ENCOUNTER — Telehealth: Payer: Self-pay | Admitting: Pharmacist

## 2024-01-12 DIAGNOSIS — Z794 Long term (current) use of insulin: Secondary | ICD-10-CM

## 2024-01-12 NOTE — Telephone Encounter (Signed)
Can we start a refill on the [place] sensor?

## 2024-01-12 NOTE — Telephone Encounter (Signed)
  Pharmacy comment: Alternative Requested:REQUIRES P/A ON MEDICAID THIS YEAR.

## 2024-01-19 ENCOUNTER — Ambulatory Visit: Payer: Medicaid Other | Admitting: Physician Assistant

## 2024-01-19 DIAGNOSIS — E1165 Type 2 diabetes mellitus with hyperglycemia: Secondary | ICD-10-CM

## 2024-01-21 DIAGNOSIS — Z419 Encounter for procedure for purposes other than remedying health state, unspecified: Secondary | ICD-10-CM | POA: Diagnosis not present

## 2024-02-05 ENCOUNTER — Other Ambulatory Visit: Payer: Self-pay | Admitting: Internal Medicine

## 2024-02-05 DIAGNOSIS — K219 Gastro-esophageal reflux disease without esophagitis: Secondary | ICD-10-CM

## 2024-02-05 DIAGNOSIS — I5032 Chronic diastolic (congestive) heart failure: Secondary | ICD-10-CM

## 2024-02-05 DIAGNOSIS — I129 Hypertensive chronic kidney disease with stage 1 through stage 4 chronic kidney disease, or unspecified chronic kidney disease: Secondary | ICD-10-CM

## 2024-02-18 DIAGNOSIS — Z419 Encounter for procedure for purposes other than remedying health state, unspecified: Secondary | ICD-10-CM | POA: Diagnosis not present

## 2024-02-25 ENCOUNTER — Other Ambulatory Visit: Payer: Self-pay | Admitting: Internal Medicine

## 2024-03-07 ENCOUNTER — Other Ambulatory Visit: Payer: Self-pay | Admitting: Internal Medicine

## 2024-03-07 DIAGNOSIS — I5032 Chronic diastolic (congestive) heart failure: Secondary | ICD-10-CM

## 2024-03-07 DIAGNOSIS — K219 Gastro-esophageal reflux disease without esophagitis: Secondary | ICD-10-CM

## 2024-03-07 DIAGNOSIS — I129 Hypertensive chronic kidney disease with stage 1 through stage 4 chronic kidney disease, or unspecified chronic kidney disease: Secondary | ICD-10-CM

## 2024-03-31 DIAGNOSIS — Z419 Encounter for procedure for purposes other than remedying health state, unspecified: Secondary | ICD-10-CM | POA: Diagnosis not present

## 2024-04-03 DIAGNOSIS — M1990 Unspecified osteoarthritis, unspecified site: Secondary | ICD-10-CM | POA: Diagnosis not present

## 2024-04-03 DIAGNOSIS — F325 Major depressive disorder, single episode, in full remission: Secondary | ICD-10-CM | POA: Diagnosis not present

## 2024-04-03 DIAGNOSIS — Z8249 Family history of ischemic heart disease and other diseases of the circulatory system: Secondary | ICD-10-CM | POA: Diagnosis not present

## 2024-04-03 DIAGNOSIS — N1832 Chronic kidney disease, stage 3b: Secondary | ICD-10-CM | POA: Diagnosis not present

## 2024-04-03 DIAGNOSIS — E1122 Type 2 diabetes mellitus with diabetic chronic kidney disease: Secondary | ICD-10-CM | POA: Diagnosis not present

## 2024-04-03 DIAGNOSIS — I701 Atherosclerosis of renal artery: Secondary | ICD-10-CM | POA: Diagnosis not present

## 2024-04-03 DIAGNOSIS — I509 Heart failure, unspecified: Secondary | ICD-10-CM | POA: Diagnosis not present

## 2024-04-03 DIAGNOSIS — E271 Primary adrenocortical insufficiency: Secondary | ICD-10-CM | POA: Diagnosis not present

## 2024-04-03 DIAGNOSIS — I2510 Atherosclerotic heart disease of native coronary artery without angina pectoris: Secondary | ICD-10-CM | POA: Diagnosis not present

## 2024-04-03 DIAGNOSIS — K219 Gastro-esophageal reflux disease without esophagitis: Secondary | ICD-10-CM | POA: Diagnosis not present

## 2024-04-03 DIAGNOSIS — I130 Hypertensive heart and chronic kidney disease with heart failure and stage 1 through stage 4 chronic kidney disease, or unspecified chronic kidney disease: Secondary | ICD-10-CM | POA: Diagnosis not present

## 2024-04-16 ENCOUNTER — Other Ambulatory Visit: Payer: Self-pay | Admitting: Internal Medicine

## 2024-04-16 DIAGNOSIS — I5032 Chronic diastolic (congestive) heart failure: Secondary | ICD-10-CM

## 2024-04-16 DIAGNOSIS — K219 Gastro-esophageal reflux disease without esophagitis: Secondary | ICD-10-CM

## 2024-04-16 DIAGNOSIS — I129 Hypertensive chronic kidney disease with stage 1 through stage 4 chronic kidney disease, or unspecified chronic kidney disease: Secondary | ICD-10-CM

## 2024-04-25 ENCOUNTER — Ambulatory Visit: Admitting: Physician Assistant

## 2024-04-26 ENCOUNTER — Other Ambulatory Visit (HOSPITAL_BASED_OUTPATIENT_CLINIC_OR_DEPARTMENT_OTHER): Payer: Self-pay

## 2024-04-26 ENCOUNTER — Other Ambulatory Visit: Payer: Self-pay

## 2024-04-26 ENCOUNTER — Ambulatory Visit: Attending: Internal Medicine | Admitting: Internal Medicine

## 2024-04-26 VITALS — BP 176/66 | HR 64 | Temp 98.1°F | Ht 64.0 in | Wt 107.0 lb

## 2024-04-26 DIAGNOSIS — N1832 Chronic kidney disease, stage 3b: Secondary | ICD-10-CM

## 2024-04-26 DIAGNOSIS — Z0289 Encounter for other administrative examinations: Secondary | ICD-10-CM

## 2024-04-26 DIAGNOSIS — E1169 Type 2 diabetes mellitus with other specified complication: Secondary | ICD-10-CM | POA: Diagnosis not present

## 2024-04-26 DIAGNOSIS — E039 Hypothyroidism, unspecified: Secondary | ICD-10-CM

## 2024-04-26 DIAGNOSIS — Z1211 Encounter for screening for malignant neoplasm of colon: Secondary | ICD-10-CM

## 2024-04-26 DIAGNOSIS — I129 Hypertensive chronic kidney disease with stage 1 through stage 4 chronic kidney disease, or unspecified chronic kidney disease: Secondary | ICD-10-CM | POA: Diagnosis not present

## 2024-04-26 DIAGNOSIS — L602 Onychogryphosis: Secondary | ICD-10-CM | POA: Diagnosis not present

## 2024-04-26 DIAGNOSIS — Z794 Long term (current) use of insulin: Secondary | ICD-10-CM

## 2024-04-26 DIAGNOSIS — Z23 Encounter for immunization: Secondary | ICD-10-CM | POA: Diagnosis not present

## 2024-04-26 DIAGNOSIS — I5032 Chronic diastolic (congestive) heart failure: Secondary | ICD-10-CM | POA: Diagnosis not present

## 2024-04-26 DIAGNOSIS — M170 Bilateral primary osteoarthritis of knee: Secondary | ICD-10-CM

## 2024-04-26 DIAGNOSIS — K219 Gastro-esophageal reflux disease without esophagitis: Secondary | ICD-10-CM

## 2024-04-26 DIAGNOSIS — F330 Major depressive disorder, recurrent, mild: Secondary | ICD-10-CM | POA: Diagnosis not present

## 2024-04-26 DIAGNOSIS — R1110 Vomiting, unspecified: Secondary | ICD-10-CM

## 2024-04-26 LAB — POCT GLYCOSYLATED HEMOGLOBIN (HGB A1C): HbA1c, POC (controlled diabetic range): 8.8 % — AB (ref 0.0–7.0)

## 2024-04-26 LAB — GLUCOSE, POCT (MANUAL RESULT ENTRY): POC Glucose: 134 mg/dL — AB (ref 70–99)

## 2024-04-26 MED ORDER — SERTRALINE HCL 50 MG PO TABS
50.0000 mg | ORAL_TABLET | Freq: Every day | ORAL | 1 refills | Status: AC
  Filled 2024-04-26: qty 90, 90d supply, fill #0
  Filled 2024-06-18 – 2024-07-10 (×3): qty 90, 90d supply, fill #1
  Filled 2024-07-17: qty 30, 30d supply, fill #1
  Filled 2024-08-14: qty 30, 30d supply, fill #2
  Filled 2024-11-30 – 2025-01-09 (×2): qty 30, 30d supply, fill #3

## 2024-04-26 MED ORDER — ONDANSETRON 4 MG PO TBDP
4.0000 mg | ORAL_TABLET | Freq: Two times a day (BID) | ORAL | 1 refills | Status: AC | PRN
  Filled 2024-04-26: qty 20, 10d supply, fill #0
  Filled 2024-06-18: qty 20, 10d supply, fill #1

## 2024-04-26 MED ORDER — EZETIMIBE 10 MG PO TABS
10.0000 mg | ORAL_TABLET | Freq: Every day | ORAL | 3 refills | Status: DC
  Filled 2024-04-26: qty 90, 90d supply, fill #0

## 2024-04-26 MED ORDER — TRAMADOL HCL 50 MG PO TABS
50.0000 mg | ORAL_TABLET | Freq: Two times a day (BID) | ORAL | 1 refills | Status: DC | PRN
  Filled 2024-04-26: qty 60, 30d supply, fill #0
  Filled 2024-06-18: qty 60, 30d supply, fill #1

## 2024-04-26 MED ORDER — HYDRALAZINE HCL 25 MG PO TABS
25.0000 mg | ORAL_TABLET | Freq: Three times a day (TID) | ORAL | 5 refills | Status: AC
  Filled 2024-04-26: qty 270, 90d supply, fill #0
  Filled 2024-06-18 – 2024-07-10 (×3): qty 270, 90d supply, fill #1
  Filled 2024-07-17: qty 90, 30d supply, fill #1
  Filled 2024-08-14: qty 90, 30d supply, fill #2
  Filled 2024-10-15 – 2025-01-09 (×3): qty 90, 30d supply, fill #3

## 2024-04-26 MED ORDER — TORSEMIDE 20 MG PO TABS
20.0000 mg | ORAL_TABLET | Freq: Every day | ORAL | 1 refills | Status: DC | PRN
Start: 2024-04-26 — End: 2024-10-15
  Filled 2024-04-26 (×2): qty 90, 90d supply, fill #0
  Filled 2024-06-18 – 2024-07-17 (×4): qty 90, 90d supply, fill #1

## 2024-04-26 MED ORDER — OMEPRAZOLE 20 MG PO CPDR
20.0000 mg | DELAYED_RELEASE_CAPSULE | Freq: Every day | ORAL | 4 refills | Status: AC
  Filled 2024-04-26: qty 30, 30d supply, fill #0
  Filled 2024-06-18: qty 30, 30d supply, fill #1
  Filled 2024-06-27 – 2024-07-17 (×3): qty 30, 30d supply, fill #2
  Filled 2024-08-14: qty 30, 30d supply, fill #3
  Filled 2024-11-30 – 2025-01-09 (×2): qty 30, 30d supply, fill #4

## 2024-04-26 MED ORDER — ALBUTEROL SULFATE HFA 108 (90 BASE) MCG/ACT IN AERS
1.0000 | INHALATION_SPRAY | Freq: Four times a day (QID) | RESPIRATORY_TRACT | 0 refills | Status: DC | PRN
  Filled 2024-04-26: qty 18, 25d supply, fill #0

## 2024-04-26 MED ORDER — ISOSORBIDE MONONITRATE ER 60 MG PO TB24
60.0000 mg | ORAL_TABLET | Freq: Every day | ORAL | 2 refills | Status: DC
  Filled 2024-04-26: qty 90, 90d supply, fill #0

## 2024-04-26 MED ORDER — FREESTYLE LIBRE 3 PLUS SENSOR MISC
10 refills | Status: AC
  Filled 2024-04-26 – 2024-05-07 (×2): qty 2, 30d supply, fill #0
  Filled 2024-06-18: qty 2, 30d supply, fill #1
  Filled 2024-07-18: qty 2, 30d supply, fill #2
  Filled 2024-08-14: qty 2, 30d supply, fill #3
  Filled 2024-10-09: qty 2, 30d supply, fill #4
  Filled 2025-01-09: qty 2, 30d supply, fill #5

## 2024-04-26 MED ORDER — AMLODIPINE BESYLATE 10 MG PO TABS
10.0000 mg | ORAL_TABLET | Freq: Every day | ORAL | 1 refills | Status: DC
Start: 2024-04-26 — End: 2024-10-15
  Filled 2024-04-26: qty 90, 90d supply, fill #0
  Filled 2024-06-18 – 2024-07-10 (×3): qty 90, 90d supply, fill #1
  Filled 2024-07-17: qty 30, 30d supply, fill #1
  Filled 2024-08-14: qty 30, 30d supply, fill #2

## 2024-04-26 NOTE — Progress Notes (Signed)
 Patient ID: [name], female    DOB: 12-Oct-1961  MRN: [id number]  CC: Diabetes (DM f/u. Med refills. /Vomiting / nausea - when eating X3 days, rapid weight loss /Bilateral knee pain X2 weeks/Yes to shingles vax. Yes to pap for another appt.)   Subjective: [name] is a 63 y.o. female who presents for chronic ds management. [name], her daughter is with her. Her concerns today include:  history of chronic adrenal insufficiency, DM type 2 (followed by Dr. Washington Hacker), HTN, stage 4 CKD, diastolic CHF (EF of 65 to 70%, followed by Dr. Veryl Gottron), hypothyroid, GERD   Discussed the use of AI scribe software for clinical note transcription with the patient, who gave verbal consent to proceed.  History of Present Illness [name] is a 63 year old female with diabetes and hypertension who presents with nausea and vomiting. She is accompanied by her daughter.  Not seen in 1 yr due to loss of Medicaid which she was recently able to acquire again.  Nausea and vomiting have persisted for three weeks, with nausea occurring postprandially and vomiting triggered by specific foods. No abdominal pains or diarrhea.  Zofran  provides relief but out of it. Can only tolerate soups and fluids leading to a six-pound weight loss over three weeks. Her current weight is 107 pounds. She reports feeling cold despite normal temperatures and had a fever of 100F for two days early in the illness. She felt hot this morning but did not measure her temperature.  Diabetes is managed with Lantus  insulin  6 units daily and NovoLog  2 units with meals, but she has not been checking blood sugars due to issues with her Freestyle [employer] sensor. Needs new rxns sent. Her A1c is 8.8%. She has f/u appt with her endocrinologist in July.  HTN: She should be on amlodipine  10 mg daily, hydralazine  25 mg 3 times a day, torsemide  20 mg as needed and isosorbide  60 mg daily. BP elevated today.  She is out of amlodipine  and  isosorbide  due to prescription refill issues. Blood pressure is elevated at 176/66 mmHg.  She experiences bilateral knee pain due to OA but avoids ibuprofen  and Tylenol . She was previously on tramadol .   Depression is managed with Zoloft , but she recently ran out. Feels she is doing well on it and request RF.    She consistently takes hydrocortisone  for adrenal insufficiency and levothyroxine  for hypothyroidism.    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   Hypokalemia 05/12/2021   Fecal impaction (HCC) 05/12/2021   Anemia due to chronic kidney disease 05/12/2021   Adrenal insufficiency (Addison's disease) (HCC) 05/12/2021   CKD (chronic kidney disease) stage 4, GFR 15-29 ml/min (HCC) 11/21/2020   Primary hypertension 11/21/2020   New onset of congestive heart failure (HCC) 11/21/2020   Dehydration 12/09/2017   Abdominal pain, bilateral lower quadrant 12/09/2017   Hypoglycemia 07/08/2017   Hypoglycemia associated with diabetes (HCC) 05/10/2017   Malnutrition of moderate degree 05/10/2017     Current Outpatient Medications on File Prior to Visit  Medication Sig Dispense Refill   Accu-Chek Softclix  Lancets lancets Use as instructed 100 each 12   aspirin  81 MG chewable tablet Chew 81 mg by mouth daily.     Calcium  Carbonate-Vit D-Min (CALCIUM  1200 PO) Take 1,200 mg by mouth daily.     Glucagon  3 MG/DOSE POWD Place 3 mg into the nose once as needed for up to 1 dose. 1 each 11   hydrocortisone  (CORTEF ) 10 MG tablet Take 2-3 tablets (20-30  mg total) by mouth daily. 300 tablet 3   insulin  aspart (NOVOLOG  FLEXPEN) 100 UNIT/ML FlexPen 2 units subcut before meals.  Hold if BS before meal less than 180 15 mL 11   insulin  glargine (LANTUS  SOLOSTAR) 100 UNIT/ML Solostar Pen Inject 6 Units into the skin at bedtime. 15 mL PRN   Insulin  Pen Needle (PEN NEEDLES) 31G X 8 MM MISC UAD 100 each 6   levothyroxine  (SYNTHROID ) 150 MCG tablet TAKE 1 TABLET BY MOUTH DAILY BEFORE BREAKFAST. 90 tablet 0   Multiple Vitamin (MULTIVITAMIN WITH MINERALS) TABS tablet Take 1 tablet by mouth daily.     vitamin B-12 (CYANOCOBALAMIN ) 1000 MCG tablet Take 1 tablet (1,000 mcg total) by mouth daily. 30 tablet 0   No current facility-administered medications on file prior to visit.    Allergies  Allergen Reactions   Beef-Derived Drug Products     RELIGOUS  REASONS   Pork-Derived Products     RELIGIOUS REASONS   Percocet [Oxycodone -Acetaminophen ] Rash    Social History   Socioeconomic History   Marital status: Single    Spouse name: Not on file   Number of children: 5   Years of education: Not on file   Highest education level: High school graduate  Occupational History   Not on file  Tobacco Use   Smoking status: Never   Smokeless tobacco: Never  Vaping Use   Vaping status: Never Used  Substance and Sexual Activity   Alcohol use: No   Drug use: No   Sexual activity: Not on file  Other Topics Concern   Not on file  Social History Narrative   01/06/22 lives with son and his family   Social Drivers of Corporate investment banker Strain: Low Risk  (04/26/2024)   Overall Financial Resource Strain (CARDIA)    Difficulty of Paying Living Expenses: Not hard at all  Food Insecurity: No Food Insecurity (04/26/2024)   Hunger Vital Sign    Worried About Running Out of Food in the Last Year: Never true    Ran Out of Food in the Last Year: Never true  Transportation Needs: No Transportation Needs (04/26/2024)   PRAPARE - Scientist, research (physical sciences) (Medical): No    Lack of Transportation (Non-Medical): No  Physical Activity: Insufficiently Active (04/26/2024)   Exercise Vital Sign    Days of Exercise per Week: 2 days    Minutes of Exercise per Session: 30 min  Stress: No Stress Concern Present (04/26/2024)   [employer] of Occupational Health - Occupational Stress Questionnaire    Feeling of Stress : Not at all  Social Connections: Socially Isolated (04/26/2024)   Social Connection and Isolation Panel [NHANES]    Frequency of Communication with Friends and Family: More than three times a week    Frequency of Social Gatherings with Friends and Family: More than three times a week    Attends Religious Services: Never    Database administrator or Organizations: No    Attends Banker Meetings: Never  Marital Status: Divorced  Catering manager Violence: Not At Risk (04/26/2024)   Humiliation, Afraid, Rape, and Kick questionnaire    Fear of Current or Ex-Partner: No    Emotionally Abused: No    Physically Abused: No    Sexually Abused: No    Family History  Problem Relation Age of Onset   Diabetes Mother    Hyperlipidemia Father    Hypertension Father    Adrenal disorder Neg Hx    Colon polyps Neg Hx    Colon cancer Neg Hx    Esophageal cancer Neg Hx    Rectal cancer Neg Hx    Stomach cancer Neg Hx     Past Surgical History:  Procedure Laterality Date   CESAREAN SECTION     IR THORACENTESIS ASP PLEURAL SPACE W/IMG GUIDE  06/05/2021    ROS: Review of Systems Negative except as stated above  PHYSICAL EXAM: BP (!) 176/66 (BP Location: Left Arm, Patient Position: Sitting, Cuff Size: Small)   Pulse 64   Temp 98.1 F (36.7 C) (Oral)   Ht 5\' 4"  (1.626 m)   Wt 107 lb (48.5 kg)   SpO2 98%   BMI 18.37 kg/m   Wt Readings from Last 3 Encounters:  04/26/24 107 lb (48.5 kg)  10/08/23 114 lb (51.7 kg)  07/07/23 113 lb (51.3 kg)    Physical Exam  General appearance - alert, well appearing,  older female and in no distress Mental status - normal mood, behavior, speech, dress, motor activity, and thought processes Mouth: oral mucosa moist Chest - clear to auscultation, no wheezes, rales or rhonchi, symmetric air entry Heart - normal rate, regular rhythm, normal S1, S2, no murmurs, rubs, clicks or gallops Abd: NL bowel sounds, soft, NT, non-distended Musculoskeletal - mild enlargement of knee jts.  No point tenderness.  Good ROM Extremities - no LE edema Diabetic Foot Exam - Simple   Simple Foot Form Diabetic Foot exam was performed with the following findings: Yes 04/26/2024  2:50 PM  Visual Inspection See comments: Yes Sensation Testing Intact to touch and monofilament testing bilaterally: Yes Pulse Check See comments: Yes Comments Hammer toes.  Nails overgrown, thick/discolored DP pulses 2+ BL         04/26/2024    2:02 PM 05/13/2023   11:47 AM 01/12/2023   10:07 AM  Depression screen PHQ 2/9  Decreased Interest 3 0 2  Down, Depressed, Hopeless 3 0 0  PHQ - 2 Score 6 0 2  Altered sleeping 3 3 3   Tired, decreased energy 3 2 2   Change in appetite 3 0 2  Feeling bad or failure about yourself  0 0 1  Trouble concentrating 0 0 3  Moving slowly or fidgety/restless 0 0 0  Suicidal thoughts 0 0 0  PHQ-9 Score 15 5 13   Difficult doing work/chores Not difficult at all         Latest Ref Rng & Units 04/26/2024    3:13 PM 10/08/2023    8:03 PM 01/13/2023    9:20 AM  CMP  Glucose 70 - 99 mg/dL 027  253  664   BUN 8 - 27 mg/dL 17  16    Creatinine 4.03 - 1.00 mg/dL 4.74  2.59    Sodium 563 - 144 mmol/L 137  131    Potassium 3.5 - 5.2 mmol/L 4.7  4.5    Chloride 96 - 106 mmol/L 102  100    CO2 20 - 29 mmol/L 22  23  Calcium  8.7 - 10.3 mg/dL 9.2  9.3    Total Protein 6.0 - 8.5 g/dL 7.2  7.7    Total Bilirubin 0.0 - 1.2 mg/dL 0.3  0.4    Alkaline Phos 44 - 121 IU/L 101  79    AST 0 - 40 IU/L 18  16    ALT 0 - 32 IU/L 11  10     Lipid Panel     Component Value  Date/Time   CHOL 190 04/26/2024 1513   TRIG 194 (H) 04/26/2024 1513   HDL 27 (L) 04/26/2024 1513   CHOLHDL 7.0 (H) 04/26/2024 1513   CHOLHDL 3.4 06/13/2011 0810   VLDL 17 06/13/2011 0810   LDLCALC 128 (H) 04/26/2024 1513    CBC    Component Value Date/Time   WBC 6.8 04/26/2024 1513   WBC 6.1 10/08/2023 2003   RBC 3.71 (L) 04/26/2024 1513   RBC 3.80 (L) 10/08/2023 2003   HGB 11.0 (L) 04/26/2024 1513   HCT 34.0 04/26/2024 1513   PLT 206 04/26/2024 1513   MCV 92 04/26/2024 1513   MCH 29.6 04/26/2024 1513   MCH 28.7 10/08/2023 2003   MCHC 32.4 04/26/2024 1513   MCHC 34.4 10/08/2023 2003   RDW 12.9 04/26/2024 1513   LYMPHSABS 2.0 10/08/2023 2003   LYMPHSABS 3.0 01/12/2023 1046   MONOABS 0.3 10/08/2023 2003   EOSABS 0.1 10/08/2023 2003   EOSABS 0.2 01/12/2023 1046   BASOSABS 0.1 10/08/2023 2003   BASOSABS 0.1 01/12/2023 1046    ASSESSMENT AND PLAN: 1. Type 2 diabetes mellitus with other specified complication, with long-term current use of insulin  (HCC) (Primary) Not at goal.  She does not have blood sugar log with her to help in determining how to adjust her insulin .  Will send prescription for CGM sensor.  Advised to bring her reader with her when she comes to see me in 6 weeks for her Pap smear.  Continue current dose of Lantus  and NovoLog  insulins as mentioned above.  Encouraged to eat smaller but more frequent meals.  Can supplement meals with Glucerna shakes. - POCT glycosylated hemoglobin (Hb A1C) - POCT glucose (manual entry) - ezetimibe  (ZETIA ) 10 MG tablet; Take 1 tablet (10 mg total) by mouth daily.  Dispense: 90 tablet; Refill: 3 - CBC - Comprehensive metabolic panel with GFR - Lipid panel - Continuous Glucose Sensor (FREESTYLE LIBRE 3 PLUS SENSOR) MISC; Change sensor every 15 days.  Dispense: 2 each; Refill: 10 - Microalbumin / creatinine urine ratio - Ambulatory referral to Ophthalmology  2. Hypertensive kidney disease with stage 3b chronic kidney disease  (HCC) Number Goal.  She has been out of amlodipine  and isosorbide .  Refill sent today on all of her medications.  Encourage compliance with medications - hydrALAZINE  (APRESOLINE ) 25 MG tablet; Take 1 tablet (25 mg total) by mouth 3 (three) times daily.  Dispense: 270 tablet; Refill: 5 - amLODipine  (NORVASC ) 10 MG tablet; Take 1 tablet (10 mg total) by mouth daily.  Dispense: 90 tablet; Refill: 1 - torsemide  (DEMADEX ) 20 MG tablet; Take 1 tablet (20 mg total) by mouth daily as needed (for weight gain of 3lbs in 1 day or 5 lbs in 2 days)  Dispense: 90 tablet; Refill: 1 - isosorbide  mononitrate (IMDUR ) 60 MG 24 hr tablet; Take 1 tablet (60 mg total) by mouth daily.  Dispense: 90 tablet; Refill: 2  3. Osteoarthritis of both knees, unspecified osteoarthritis type Bilateral knee pain. Tramadol  effective in past without significant S.E. No NSAIDS  due to CKD. - Renew tramadol  prescription. - Complete controlled substance prescribing agreement. - Perform urine drug screen for tramadol . NCCSRS reviewed - traMADol  (ULTRAM ) 50 MG tablet; Take 1 tablet (50 mg total) by mouth 2 (two) times daily as needed.  Dispense: 60 tablet; Refill: 1 - Drug Profile, Ur, 9 Drugs  4. Major depressive disorder, recurrent episode, mild (HCC) Stable on Zoloft  but recently ran out.  RF sent - sertraline  (ZOLOFT ) 50 MG tablet; Take 1 tablet (50 mg total) by mouth daily.  Dispense: 90 tablet; Refill: 1  5. Acquired hypothyroidism Continue Levothyroxine  - TSH  6. Gastroesophageal reflux disease without esophagitis RF Omeprazole  but lower dose due to CKD - omeprazole  (PRILOSEC) 20 MG capsule; Take 1 capsule (20 mg total) by mouth daily.  Dispense: 30 capsule; Refill: 4  7. Recurrent vomiting - ondansetron  (ZOFRAN -ODT) 4 MG disintegrating tablet; Take 1 tablet (4 mg total) by mouth 2 (two) times daily as needed for nausea or vomiting.  Dispense: 20 tablet; Refill: 1 - NM GASTRIC EMPTYING SOLID LIQUID BOTH W/SB TRANSIT;  Future  8. Overgrown toenails - Ambulatory referral to Podiatry  9. Chronic diastolic heart failure (HCC) Stable on PRN Torsemide . Need for better BP control  10. Pain management contract agreement See # 3 above  11. Screening for colon cancer - Ambulatory referral to Gastroenterology   Patient was given the opportunity to ask questions.  Patient verbalized understanding of the plan and was able to repeat key elements of the plan.   This documentation was completed using Paediatric nurse.  Any transcriptional errors are unintentional.  Orders Placed This Encounter  Procedures   NM GASTRIC EMPTYING SOLID LIQUID BOTH W/SB TRANSIT   Varicella-zoster vaccine IM   CBC   Comprehensive metabolic panel with GFR   Lipid panel   Microalbumin / creatinine urine ratio   Drug Profile, Ur, 9 Drugs   TSH   Ambulatory referral to Ophthalmology   Ambulatory referral to Podiatry   Ambulatory referral to Gastroenterology   POCT glycosylated hemoglobin (Hb A1C)   POCT glucose (manual entry)     Requested Prescriptions   Signed Prescriptions Disp Refills   albuterol  (VENTOLIN  HFA) 108 (90 Base) MCG/ACT inhaler 18 g 0    Sig: Inhale 1-2 puffs into the lungs every 6 (six) hours as needed for wheezing or shortness of breath.   hydrALAZINE  (APRESOLINE ) 25 MG tablet 270 tablet 5    Sig: Take 1 tablet (25 mg total) by mouth 3 (three) times daily.   omeprazole  (PRILOSEC) 20 MG capsule 30 capsule 4    Sig: Take 1 capsule (20 mg total) by mouth daily.   ondansetron  (ZOFRAN -ODT) 4 MG disintegrating tablet 20 tablet 1    Sig: Take 1 tablet (4 mg total) by mouth 2 (two) times daily as needed for nausea or vomiting.   traMADol  (ULTRAM ) 50 MG tablet 60 tablet 1    Sig: Take 1 tablet (50 mg total) by mouth 2 (two) times daily as needed.   sertraline  (ZOLOFT ) 50 MG tablet 90 tablet 1    Sig: Take 1 tablet (50 mg total) by mouth daily.   amLODipine  (NORVASC ) 10 MG tablet 90 tablet 1     Sig: Take 1 tablet (10 mg total) by mouth daily.   ezetimibe  (ZETIA ) 10 MG tablet 90 tablet 3    Sig: Take 1 tablet (10 mg total) by mouth daily.   Continuous Glucose Sensor (FREESTYLE LIBRE 3 PLUS SENSOR) MISC 2 each 10  Sig: Change sensor every 15 days.   torsemide  (DEMADEX ) 20 MG tablet 90 tablet 1    Sig: Take 1 tablet (20 mg total) by mouth daily as needed (for weight gain of 3lbs in 1 day or 5 lbs in 2 days)   isosorbide  mononitrate (IMDUR ) 60 MG 24 hr tablet 90 tablet 2    Sig: Take 1 tablet (60 mg total) by mouth daily.    Return in about 6 weeks (around 06/07/2024) for PAP.  Concetta Dee, MD, FACP

## 2024-04-27 ENCOUNTER — Other Ambulatory Visit (HOSPITAL_BASED_OUTPATIENT_CLINIC_OR_DEPARTMENT_OTHER): Payer: Self-pay

## 2024-04-27 ENCOUNTER — Ambulatory Visit: Admitting: Internal Medicine

## 2024-04-27 ENCOUNTER — Other Ambulatory Visit: Payer: Self-pay

## 2024-04-27 LAB — COMPREHENSIVE METABOLIC PANEL WITH GFR
ALT: 11 IU/L (ref 0–32)
AST: 18 IU/L (ref 0–40)
Albumin: 4.4 g/dL (ref 3.9–4.9)
Alkaline Phosphatase: 101 IU/L (ref 44–121)
BUN/Creatinine Ratio: 10 — ABNORMAL LOW (ref 12–28)
BUN: 17 mg/dL (ref 8–27)
Bilirubin Total: 0.3 mg/dL (ref 0.0–1.2)
CO2: 22 mmol/L (ref 20–29)
Calcium: 9.2 mg/dL (ref 8.7–10.3)
Chloride: 102 mmol/L (ref 96–106)
Creatinine, Ser: 1.65 mg/dL — ABNORMAL HIGH (ref 0.57–1.00)
Globulin, Total: 2.8 g/dL (ref 1.5–4.5)
Glucose: 112 mg/dL — ABNORMAL HIGH (ref 70–99)
Potassium: 4.7 mmol/L (ref 3.5–5.2)
Sodium: 137 mmol/L (ref 134–144)
Total Protein: 7.2 g/dL (ref 6.0–8.5)
eGFR: 34 mL/min/{1.73_m2} — ABNORMAL LOW (ref >59)

## 2024-04-27 LAB — LIPID PANEL
Chol/HDL Ratio: 7 ratio — ABNORMAL HIGH (ref 0.0–4.4)
Cholesterol, Total: 190 mg/dL (ref 100–199)
HDL: 27 mg/dL — ABNORMAL LOW (ref >39)
LDL Chol Calc (NIH): 128 mg/dL — ABNORMAL HIGH (ref 0–99)
Triglycerides: 194 mg/dL — ABNORMAL HIGH (ref 0–149)
VLDL Cholesterol Cal: 35 mg/dL (ref 5–40)

## 2024-04-27 LAB — CBC
Hematocrit: 34 % (ref 34.0–46.6)
Hemoglobin: 11 g/dL — ABNORMAL LOW (ref 11.1–15.9)
MCH: 29.6 pg (ref 26.6–33.0)
MCHC: 32.4 g/dL (ref 31.5–35.7)
MCV: 92 fL (ref 79–97)
Platelets: 206 10*3/uL (ref 150–450)
RBC: 3.71 x10E6/uL — ABNORMAL LOW (ref 3.77–5.28)
RDW: 12.9 % (ref 11.7–15.4)
WBC: 6.8 10*3/uL (ref 3.4–10.8)

## 2024-04-27 LAB — TSH: TSH: 2.94 u[IU]/mL (ref 0.450–4.500)

## 2024-04-27 LAB — MICROALBUMIN / CREATININE URINE RATIO
Creatinine, Urine: 228 mg/dL
Microalb/Creat Ratio: 3 mg/g{creat} (ref 0–29)
Microalbumin, Urine: 5.8 ug/mL

## 2024-04-28 ENCOUNTER — Encounter: Payer: Self-pay | Admitting: Internal Medicine

## 2024-04-28 ENCOUNTER — Other Ambulatory Visit (HOSPITAL_BASED_OUTPATIENT_CLINIC_OR_DEPARTMENT_OTHER): Payer: Self-pay

## 2024-04-28 ENCOUNTER — Other Ambulatory Visit: Payer: Self-pay | Admitting: Internal Medicine

## 2024-04-28 LAB — DRUG PROFILE, UR, 9 DRUGS (LABCORP)
Amphetamines, Urine: NEGATIVE ng/mL
Barbiturate Quant, Ur: NEGATIVE ng/mL
Benzodiazepine Quant, Ur: NEGATIVE ng/mL
Cannabinoid Quant, Ur: NEGATIVE ng/mL
Cocaine (Metab.): NEGATIVE ng/mL
Creatinine, Urine: 225.4 mg/dL (ref 20.0–300.0)
Methadone Screen, Urine: NEGATIVE ng/mL
Nitrite Urine, Quantitative: NEGATIVE ug/mL
OPIATE SCREEN URINE: NEGATIVE ng/mL
PCP Quant, Ur: NEGATIVE ng/mL
Propoxyphene: NEGATIVE ng/mL
pH, Urine: 5.2 (ref 4.5–8.9)

## 2024-04-28 MED ORDER — ROSUVASTATIN CALCIUM 10 MG PO TABS
10.0000 mg | ORAL_TABLET | Freq: Every day | ORAL | 3 refills | Status: AC
  Filled 2024-04-28: qty 90, 90d supply, fill #0
  Filled 2024-06-18 – 2024-07-10 (×3): qty 90, 90d supply, fill #1
  Filled 2024-07-17: qty 30, 30d supply, fill #1
  Filled 2024-08-14: qty 30, 30d supply, fill #2
  Filled 2024-11-30 – 2025-01-09 (×2): qty 30, 30d supply, fill #3

## 2024-04-30 ENCOUNTER — Other Ambulatory Visit: Payer: Self-pay

## 2024-04-30 ENCOUNTER — Other Ambulatory Visit (HOSPITAL_BASED_OUTPATIENT_CLINIC_OR_DEPARTMENT_OTHER): Payer: Self-pay

## 2024-04-30 DIAGNOSIS — Z419 Encounter for procedure for purposes other than remedying health state, unspecified: Secondary | ICD-10-CM | POA: Diagnosis not present

## 2024-05-07 ENCOUNTER — Other Ambulatory Visit (HOSPITAL_BASED_OUTPATIENT_CLINIC_OR_DEPARTMENT_OTHER): Payer: Self-pay

## 2024-05-07 ENCOUNTER — Telehealth (HOSPITAL_BASED_OUTPATIENT_CLINIC_OR_DEPARTMENT_OTHER): Payer: Self-pay

## 2024-05-07 DIAGNOSIS — N2581 Secondary hyperparathyroidism of renal origin: Secondary | ICD-10-CM | POA: Diagnosis not present

## 2024-05-07 DIAGNOSIS — I701 Atherosclerosis of renal artery: Secondary | ICD-10-CM | POA: Diagnosis not present

## 2024-05-07 DIAGNOSIS — E1122 Type 2 diabetes mellitus with diabetic chronic kidney disease: Secondary | ICD-10-CM | POA: Diagnosis not present

## 2024-05-07 DIAGNOSIS — N184 Chronic kidney disease, stage 4 (severe): Secondary | ICD-10-CM | POA: Diagnosis not present

## 2024-05-07 DIAGNOSIS — I129 Hypertensive chronic kidney disease with stage 1 through stage 4 chronic kidney disease, or unspecified chronic kidney disease: Secondary | ICD-10-CM | POA: Diagnosis not present

## 2024-05-07 DIAGNOSIS — D649 Anemia, unspecified: Secondary | ICD-10-CM | POA: Diagnosis not present

## 2024-05-07 DIAGNOSIS — E2740 Unspecified adrenocortical insufficiency: Secondary | ICD-10-CM | POA: Diagnosis not present

## 2024-05-07 MED ORDER — ISOSORBIDE MONONITRATE ER 120 MG PO TB24
120.0000 mg | ORAL_TABLET | Freq: Every day | ORAL | 3 refills | Status: AC
  Filled 2024-05-07: qty 90, 90d supply, fill #0
  Filled 2024-06-18 – 2024-07-10 (×3): qty 90, 90d supply, fill #1
  Filled 2024-07-17: qty 30, 30d supply, fill #1
  Filled 2024-08-14: qty 30, 30d supply, fill #2
  Filled 2024-11-30 – 2025-01-09 (×2): qty 30, 30d supply, fill #3

## 2024-05-07 NOTE — Telephone Encounter (Signed)
 Pharmacy Patient Advocate Encounter   Received notification from CoverMyMeds that prior authorization for freestyle libre 3 plus sensor is required/requested.   Insurance verification completed.   The patient is insured through [facility] [place] .   Per test claim: PA required; PA submitted to above mentioned insurance via CoverMyMeds Key/confirmation #/EOC [id number] Status is pending

## 2024-05-08 ENCOUNTER — Other Ambulatory Visit: Payer: Self-pay

## 2024-05-11 ENCOUNTER — Encounter (HOSPITAL_BASED_OUTPATIENT_CLINIC_OR_DEPARTMENT_OTHER): Payer: Self-pay | Admitting: Family

## 2024-05-11 ENCOUNTER — Other Ambulatory Visit (HOSPITAL_BASED_OUTPATIENT_CLINIC_OR_DEPARTMENT_OTHER): Payer: Self-pay

## 2024-05-11 ENCOUNTER — Ambulatory Visit (HOSPITAL_BASED_OUTPATIENT_CLINIC_OR_DEPARTMENT_OTHER): Admitting: Family

## 2024-05-11 VITALS — BP 140/60 | HR 81 | Ht 64.0 in | Wt 103.9 lb

## 2024-05-11 DIAGNOSIS — I10 Essential (primary) hypertension: Secondary | ICD-10-CM

## 2024-05-11 DIAGNOSIS — I5032 Chronic diastolic (congestive) heart failure: Secondary | ICD-10-CM

## 2024-05-11 NOTE — Patient Instructions (Signed)
 Medication Instructions:  Continue your current medications *If you need a refill on your cardiac medications before your next appointment, please call your pharmacy*  Follow-Up: At [facility], you and your health needs are our priority.  As part of our continuing mission to provide you with exceptional heart care, our providers are all part of one team.  This team includes your primary Cardiologist (physician) and Advanced Practice Providers or APPs (Physician Assistants and Nurse Practitioners) who all work together to provide you with the care you need, when you need it.  Your next appointment:   1 year(s)  Provider:   Sheryle Donning, MD, Slater Duncan, NP, or Neomi Banks, NP    We recommend signing up for the patient portal called "MyChart".  Sign up information is provided on this After Visit Summary.  MyChart is used to connect with patients for Virtual Visits (Telemedicine).  Patients are able to view lab/test results, encounter notes, upcoming appointments, etc.  Non-urgent messages can be sent to your provider as well.   To learn more about what you can do with MyChart, go to [email or web address].   Other Instructions  Tips to Measure your Blood Pressure Correctly   Here's what you can do to ensure a correct reading:  Don't drink a caffeinated beverage or smoke during the 30 minutes before the test.  Sit quietly for five minutes before the test begins.  During the measurement, sit in a chair with your feet on the floor and your arm supported so your elbow is at about heart level.  The inflatable part of the cuff should completely cover at least 80% of your upper arm, and the cuff should be placed on bare skin, not over a shirt.  Don't talk during the measurement.  Have your blood pressure measured twice, with a brief break in between. If the readings are different by 5 points or more, have it done a third time.  In 2017, new guidelines from the  American Heart Association, the [employer] of Cardiology, and nine other health organizations lowered the diagnosis of high blood pressure to 130/80 mm Hg or higher for all adults. The guidelines also redefined the various blood pressure categories to now include normal, elevated, Stage 1 hypertension, Stage 2 hypertension, and hypertensive crisis (see "Blood pressure categories").  Blood pressure categories  Blood pressure category SYSTOLIC (upper number)  DIASTOLIC (lower number)  Normal Less than 120 mm Hg and Less than 80 mm Hg  Elevated 120-129 mm Hg and Less than 80 mm Hg  High blood pressure: Stage 1 hypertension 130-139 mm Hg or 80-89 mm Hg  High blood pressure: Stage 2 hypertension 140 mm Hg or higher or 90 mm Hg or higher  Hypertensive crisis (consult your doctor immediately) Higher than 180 mm Hg and/or Higher than 120 mm Hg  Source: American Heart Association and American Stroke Association. For more on getting your blood pressure under control, buy Controlling Your Blood Pressure, a Special Health Report from [facility].   Blood Pressure Log   Date   Time  Blood Pressure  Position  Example: Nov 1 9 AM 124/78 sitting

## 2024-05-11 NOTE — Progress Notes (Unsigned)
  Cardiology Office Note:  .   Date:  05/11/2024  ID:  [name], DOB 03-27-1961, MRN [id number] PCP: Suzanne Presume, MD  [facility] Providers Cardiologist:  Suzanne Donning, MD { Click to update primary MD,subspecialty MD or APP then REFRESH:1}   History of Present Illness: .   [name] is a 63 y.o. female ***with hx of chronic diastolic heart failure, labile hypertension, CKD 4, sinus bradycardia, DM2, adrenal insufficiency, hypothyroidism.   150-160  8:00 am thyroid  pill, 9:30 am morning meds, 1:30pm Hydralazine , 8pm pm   Discussed the use of AI scribe software for clinical note transcription with the patient, who gave verbal consent to proceed.  History of Present Illness     ROS: Please see the history of present illness.    All other systems reviewed and are negative.   Studies Reviewed: .        *** Risk Assessment/Calculations:     The patient's 1st BP is elevated (>139/89)*** Repeat BP and {Click to enter a 2nd BP Refresh Note  :1}       Physical Exam:   VS:  BP (!) 140/60 (BP Location: Left Arm, Patient Position: Sitting, Cuff Size: Normal)   Pulse 81   Ht 5\' 4"  (1.626 m)   Wt 103 lb 14.4 oz (47.1 kg)   SpO2 97%   BMI 17.83 kg/m    Wt Readings from Last 3 Encounters:  05/11/24 103 lb 14.4 oz (47.1 kg)  04/26/24 107 lb (48.5 kg)  10/08/23 114 lb (51.7 kg)    GEN: Well nourished, well developed in no acute distress NECK: No JVD; No carotid bruits CARDIAC: ***RRR, no murmurs, rubs, gallops RESPIRATORY:  Clear to auscultation without rales, wheezing or rhonchi  ABDOMEN: Soft, non-tender, non-distended EXTREMITIES:  No edema; No deformity   ASSESSMENT AND PLAN: .   *** Assessment and Plan Assessment & Plan         {Are you ordering a CV Procedure (e.g. stress test, cath, DCCV, TEE, etc)?   Press F2        :[id number]}  Dispo: ***  Signed, Suzanne Curia, NP

## 2024-05-16 ENCOUNTER — Other Ambulatory Visit: Payer: Self-pay

## 2024-05-24 ENCOUNTER — Encounter: Payer: Self-pay | Admitting: Podiatry

## 2024-05-24 ENCOUNTER — Ambulatory Visit (INDEPENDENT_AMBULATORY_CARE_PROVIDER_SITE_OTHER): Admitting: Podiatry

## 2024-05-24 DIAGNOSIS — M79675 Pain in left toe(s): Secondary | ICD-10-CM

## 2024-05-24 DIAGNOSIS — B351 Tinea unguium: Secondary | ICD-10-CM

## 2024-05-24 DIAGNOSIS — M79674 Pain in right toe(s): Secondary | ICD-10-CM

## 2024-05-24 DIAGNOSIS — N184 Chronic kidney disease, stage 4 (severe): Secondary | ICD-10-CM

## 2024-05-24 DIAGNOSIS — E11649 Type 2 diabetes mellitus with hypoglycemia without coma: Secondary | ICD-10-CM

## 2024-05-24 NOTE — Progress Notes (Signed)
 This patient presents to the office with chief complaint of long thick nails and diabetic feet.  This patient  says there  is  no pain and discomfort in her feet.  This patient says there are long thick painful nails.  These nails are painful walking and wearing shoes.   Patient is unable to  self treat his own nails . This patient presents  to the office today for treatment of the  long nails and a foot evaluation due to history of  diabetes.  General Appearance  Alert, conversant and in no acute stress.  Vascular  Dorsalis pedis and posterior tibial  pulses are palpable  bilaterally.  Capillary return is within normal limits  bilaterally. Temperature is within normal limits  bilaterally.  Neurologic  Senn-Weinstein monofilament wire test within normal limits  bilaterally. Muscle power within normal limits bilaterally.  Nails Thick disfigured discolored nails with subungual debris  from hallux to fifth toes bilaterally. No evidence of bacterial infection or drainage bilaterally.  Orthopedic  No limitations of motion of motion feet .  No crepitus or effusions noted.  No bony pathology or digital deformities noted.  Skin  normotropic skin with no porokeratosis noted bilaterally.  No signs of infections or ulcers noted.     Onychomycosis  Diabetes with no foot complications  IE  Debride nails x 10.  A diabetic foot exam was performed and there is no evidence of any vascular or neurologic pathology.   RTC 3 months.   Ruffin Cotton DPM

## 2024-05-27 ENCOUNTER — Other Ambulatory Visit: Payer: Self-pay | Admitting: Endocrinology

## 2024-05-28 ENCOUNTER — Other Ambulatory Visit: Payer: Self-pay

## 2024-05-31 DIAGNOSIS — Z419 Encounter for procedure for purposes other than remedying health state, unspecified: Secondary | ICD-10-CM | POA: Diagnosis not present

## 2024-06-11 ENCOUNTER — Encounter: Payer: Self-pay | Admitting: Internal Medicine

## 2024-06-11 ENCOUNTER — Ambulatory Visit: Attending: Internal Medicine | Admitting: Internal Medicine

## 2024-06-11 ENCOUNTER — Other Ambulatory Visit (HOSPITAL_BASED_OUTPATIENT_CLINIC_OR_DEPARTMENT_OTHER): Payer: Self-pay

## 2024-06-11 VITALS — BP 105/64 | HR 85 | Ht 64.0 in | Wt 102.0 lb

## 2024-06-11 DIAGNOSIS — Z1231 Encounter for screening mammogram for malignant neoplasm of breast: Secondary | ICD-10-CM

## 2024-06-11 DIAGNOSIS — N898 Other specified noninflammatory disorders of vagina: Secondary | ICD-10-CM | POA: Diagnosis not present

## 2024-06-11 DIAGNOSIS — Z01419 Encounter for gynecological examination (general) (routine) without abnormal findings: Secondary | ICD-10-CM | POA: Diagnosis not present

## 2024-06-11 DIAGNOSIS — R059 Cough, unspecified: Secondary | ICD-10-CM

## 2024-06-11 MED ORDER — FLUTICASONE PROPIONATE 50 MCG/ACT NA SUSP
1.0000 | Freq: Every day | NASAL | 0 refills | Status: DC
  Filled 2024-06-11: qty 16, 60d supply, fill #0

## 2024-06-11 MED ORDER — ALBUTEROL SULFATE HFA 108 (90 BASE) MCG/ACT IN AERS
1.0000 | INHALATION_SPRAY | Freq: Four times a day (QID) | RESPIRATORY_TRACT | 0 refills | Status: AC | PRN
  Filled 2024-06-11: qty 18, 25d supply, fill #0

## 2024-06-11 NOTE — Patient Instructions (Addendum)
 We have referred you to the gynecologist to have your Pap smear done and to evaluate the 2 black spots located on the groin area on the left side.  VISIT SUMMARY:  Today, you came in for a Pap smear and to evaluate your persistent cough. You were accompanied by your daughter, Veneta. We discussed your medical history, including your postmenopausal status and history of pregnancies. We also reviewed your symptoms of a persistent dry cough and sore throat that have been ongoing for the past month.  YOUR PLAN:  -CHRONIC COUGH: A chronic cough is a cough that lasts for an extended period of time. It can be caused by various factors such as infections, allergies, asthma, or acid reflux. To help manage your symptoms, I have prescribed Flonase  nasal spray to use along with Zyrtec . Additionally, I have prescribed an albuterol  inhaler to use as needed. We have also ordered a chest x-ray to be done at [facility] during your gastric emptying study appointment.  -GENERAL HEALTH MAINTENANCE: General health maintenance involves routine check-ups and screenings to ensure overall well-being. We have referred you to the gynecologist to have your Pap smear done and to evaluate the 2 black spots located on the groin area on the left side.  We had a difficult time locating your cervix due to severe atrophy of the vaginal walls.  INSTRUCTIONS:  Please use the Flonase  nasal spray and Zyrtec  as directed to help manage your cough. Use the albuterol  inhaler as needed for wheezing. Make sure to complete the chest x-ray at [facility] during your gastric emptying study appointment. We will contact you to schedule your mammogram. Follow up with us  if your symptoms persist or worsen.

## 2024-06-11 NOTE — Progress Notes (Signed)
 Patient ID: [name], female    DOB: 10-17-1961  MRN: [id number]  CC: Gynecologic Exam (Pap. /On-going intermittent cough, sore throat due to excessive cough, X1 mo /Resumed vomiting Wednesday /Fatigue, unable to sleep at night until 4 a.m. - 11 a.m.)   Subjective: [name] is a 63 y.o. female who presents for PAP.  Veneta Sharps, her daughter is with her.  Her concerns today include:  history of chronic adrenal insufficiency, DM type 2 (followed by Dr. Kassie), HTN, stage 4 CKD, diastolic CHF (EF of 65 to 70%, followed by Dr. Lonni), hypothyroid, GERD   Discussed the use of AI scribe software for clinical note transcription with the patient, who gave verbal consent to proceed.  History of Present Illness [name] is a 63 year old female who presents for a Pap smear and evaluation of a persistent cough. She is accompanied by her daughter, Veneta Sharps.  She is postmenopausal with a history of six pregnancies, five live births, and one miscarriage. She has no history of abnormal Pap smears, vaginal bleeding, discharge, or dysuria. She is not currently sexually active.  There is no family history of breast, cervical or uterine cancer.  Last mammogram was July 2024.  She has a persistent dry cough causing sore throat that began last month. Over-the-counter Zyrtec  and cough medicine have been ineffective. The cough is severe, dry, and causes throat soreness. Wheezing occurs after prolonged coughing, and the cough sometimes disrupts her sleep. There is no runny nose, postnasal drip, or sneezing. Occasionally, she has an itchy throat but no itchy eyes. No fever or rhinorrhea at symptom onset. She has a history of acid reflux, well-controlled with Prilosec. Albuterol  on med list but she is not sure if she has this at home.    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   Hypokalemia 05/12/2021   Fecal impaction (HCC) 05/12/2021   Anemia due to chronic kidney disease 05/12/2021   Adrenal insufficiency (Addison's disease) (HCC) 05/12/2021   CKD (chronic kidney disease) stage 4, GFR 15-29 ml/min (HCC) 11/21/2020   Primary hypertension 11/21/2020   New onset of congestive heart failure (HCC) 11/21/2020   Dehydration 12/09/2017   Abdominal pain, bilateral lower quadrant 12/09/2017   Hypoglycemia 07/08/2017   Hypoglycemia associated with diabetes (HCC) 05/10/2017   Malnutrition of moderate degree 05/10/2017     Current Outpatient Medications on File Prior to Visit  Medication Sig Dispense Refill   Accu-Chek Softclix Lancets lancets Use as instructed 100 each 12   albuterol  (VENTOLIN  HFA) 108 (90 Base) MCG/ACT inhaler Inhale 1-2 puffs into the lungs every 6 (six) hours as needed for wheezing or shortness of breath. 18 g 0   amLODipine  (NORVASC ) 10 MG tablet Take 1 tablet (10 mg total) by mouth daily. 90 tablet 1   aspirin  81 MG chewable tablet Chew 81 mg by mouth daily.     Calcium  Carbonate-Vit D-Min (CALCIUM  1200 PO) Take 1,200 mg by mouth daily.     Continuous Glucose Sensor (FREESTYLE LIBRE 3 PLUS SENSOR) MISC Change sensor every 15 days. 2 each 10  Glucagon  3 MG/DOSE POWD Place 3 mg into the nose once as needed for up to 1 dose. 1 each 11   hydrALAZINE  (APRESOLINE ) 25 MG tablet Take 1 tablet (25 mg total) by mouth 3 (three) times daily. 270 tablet 5   hydrocortisone  (CORTEF ) 10 MG tablet Take 2-3 tablets (20-30 mg total) by mouth daily. 300 tablet 3   insulin  aspart (NOVOLOG  FLEXPEN) 100 UNIT/ML FlexPen 2  units subcut before meals.  Hold if BS before meal less than 180 15 mL 11   insulin  glargine (LANTUS  SOLOSTAR) 100 UNIT/ML Solostar Pen Inject 6 Units into the skin at bedtime. 15 mL PRN   Insulin  Pen Needle (PEN NEEDLES) 31G X 8 MM MISC UAD 100 each 6   isosorbide  mononitrate (IMDUR ) 120 MG 24 hr tablet Take 1 tablet (120 mg total) by mouth daily. 90 tablet 3   levothyroxine  (SYNTHROID ) 150 MCG tablet TAKE 1 TABLET BY MOUTH EVERY DAY BEFORE BREAKFAST 90 tablet 3   Multiple Vitamin (MULTIVITAMIN WITH MINERALS) TABS tablet Take 1 tablet by mouth daily.     omeprazole  (PRILOSEC) 20 MG capsule Take 1 capsule (20 mg total) by mouth daily. 30 capsule 4   ondansetron  (ZOFRAN -ODT) 4 MG disintegrating tablet Take 1 tablet (4 mg total) by mouth 2 (two) times daily as needed for nausea or vomiting. 20 tablet 1   rosuvastatin  (CRESTOR ) 10 MG tablet Take 1 tablet (10 mg total) by mouth daily. Stop Zetia  90 tablet 3   sertraline  (ZOLOFT ) 50 MG tablet Take 1 tablet (50 mg total) by mouth daily. 90 tablet 1   torsemide  (DEMADEX ) 20 MG tablet Take 1 tablet (20 mg total) by mouth daily as needed (for weight gain of 3lbs in 1 day or 5 lbs in 2 days) 90 tablet 1   traMADol  (ULTRAM ) 50 MG tablet Take 1 tablet (50 mg total) by mouth 2 (two) times daily as needed. 60 tablet 1   vitamin B-12 (CYANOCOBALAMIN ) 1000 MCG tablet Take 1 tablet (1,000 mcg total) by mouth daily. 30 tablet 0   No current facility-administered medications on file prior to visit.    Allergies  Allergen Reactions   Beef-Derived Drug Products     RELIGOUS  REASONS   Pork-Derived Products     RELIGIOUS REASONS   Percocet [Oxycodone -Acetaminophen ] Rash    Social History   Socioeconomic History   Marital status: Single    Spouse name: Not on file   Number of children: 5   Years of education: Not on file   Highest education level: High school graduate  Occupational History   Not on file  Tobacco Use   Smoking status: Never   Smokeless  tobacco: Never  Vaping Use   Vaping status: Never Used  Substance and Sexual Activity   Alcohol use: No   Drug use: No   Sexual activity: Not on file  Other Topics Concern   Not on file  Social History Narrative   01/06/22 lives with son and his family   Social Drivers of Corporate investment banker Strain: Low Risk  (04/26/2024)   Overall Financial Resource Strain (CARDIA)    Difficulty of Paying Living Expenses: Not hard at all  Food Insecurity: No Food Insecurity (04/26/2024)   Hunger Vital Sign    Worried About Running Out of Food in the Last Year: Never true    Ran Out of Food in the Last Year: Never true  Transportation Needs: No Transportation Needs (04/26/2024)   PRAPARE -  Administrator, Civil Service (Medical): No    Lack of Transportation (Non-Medical): No  Physical Activity: Insufficiently Active (04/26/2024)   Exercise Vital Sign    Days of Exercise per Week: 2 days    Minutes of Exercise per Session: 30 min  Stress: No Stress Concern Present (04/26/2024)   [employer] of Occupational Health - Occupational Stress Questionnaire    Feeling of Stress : Not at all  Social Connections: Socially Isolated (04/26/2024)   Social Connection and Isolation Panel    Frequency of Communication with Friends and Family: More than three times a week    Frequency of Social Gatherings with Friends and Family: More than three times a week    Attends Religious Services: Never    Database administrator or Organizations: No    Attends Banker Meetings: Never    Marital Status: Divorced  Catering manager Violence: Not At Risk (04/26/2024)   Humiliation, Afraid, Rape, and Kick questionnaire    Fear of Current or Ex-Partner: No    Emotionally Abused: No    Physically Abused: No    Sexually Abused: No    Family History  Problem Relation Age of Onset   Diabetes Mother    Hyperlipidemia Father    Hypertension Father    Adrenal disorder Neg Hx    Colon polyps Neg  Hx    Colon cancer Neg Hx    Esophageal cancer Neg Hx    Rectal cancer Neg Hx    Stomach cancer Neg Hx     Past Surgical History:  Procedure Laterality Date   CESAREAN SECTION     IR THORACENTESIS ASP PLEURAL SPACE W/IMG GUIDE  06/05/2021    ROS: Review of Systems Negative except as stated above  PHYSICAL EXAM: BP 105/64 (BP Location: Left Arm, Patient Position: Sitting, Cuff Size: Normal)   Pulse 85   Ht 5' 4 (1.626 m)   Wt 102 lb (46.3 kg)   SpO2 99%   BMI 17.51 kg/m   Wt Readings from Last 3 Encounters:  06/11/24 102 lb (46.3 kg)  05/11/24 103 lb 14.4 oz (47.1 kg)  04/26/24 107 lb (48.5 kg)    Physical Exam  General appearance - alert, older female who appears underweight for height, and in no distress Mental status - normal mood, behavior, speech, dress, motor activity, and thought processes Nose - normal and patent, no erythema, discharge or polyps Mouth - mucous membranes moist, pharynx normal without lesions Neck - supple, no significant adenopathy Lymphatics - no palpable lymphadenopathy, no hepatosplenomegaly Chest -breath sounds slightly decreased at the bases but otherwise clear Heart - RRR Pelvic -CMA Clarisa is present: patient has two 1 cm hyperpigmented slightly raised lesions on the left side of the mons pubis. Vaginal walls extremely atrophied and friable with whitish appearance.  Cervix arch architecture and landmarks barely visible      Latest Ref Rng & Units 04/26/2024    3:13 PM 10/08/2023    8:03 PM 01/13/2023    9:20 AM  CMP  Glucose 70 - 99 mg/dL 887  666  823   BUN 8 - 27 mg/dL 17  16    Creatinine 9.42 - 1.00 mg/dL 8.34  8.46    Sodium 865 - 144 mmol/L 137  131    Potassium 3.5 - 5.2 mmol/L 4.7  4.5    Chloride 96 - 106 mmol/L 102  100    CO2 20 - 29 mmol/L 22  23  Calcium  8.7 - 10.3 mg/dL 9.2  9.3    Total Protein 6.0 - 8.5 g/dL 7.2  7.7    Total Bilirubin 0.0 - 1.2 mg/dL 0.3  0.4    Alkaline Phos 44 - 121 IU/L 101  79    AST 0 -  40 IU/L 18  16    ALT 0 - 32 IU/L 11  10     Lipid Panel     Component Value Date/Time   CHOL 190 04/26/2024 1513   TRIG 194 (H) 04/26/2024 1513   HDL 27 (L) 04/26/2024 1513   CHOLHDL 7.0 (H) 04/26/2024 1513   CHOLHDL 3.4 06/13/2011 0810   VLDL 17 06/13/2011 0810   LDLCALC 128 (H) 04/26/2024 1513    CBC    Component Value Date/Time   WBC 6.8 04/26/2024 1513   WBC 6.1 10/08/2023 2003   RBC 3.71 (L) 04/26/2024 1513   RBC 3.80 (L) 10/08/2023 2003   HGB 11.0 (L) 04/26/2024 1513   HCT 34.0 04/26/2024 1513   PLT 206 04/26/2024 1513   MCV 92 04/26/2024 1513   MCH 29.6 04/26/2024 1513   MCH 28.7 10/08/2023 2003   MCHC 32.4 04/26/2024 1513   MCHC 34.4 10/08/2023 2003   RDW 12.9 04/26/2024 1513   LYMPHSABS 2.0 10/08/2023 2003   LYMPHSABS 3.0 01/12/2023 1046   MONOABS 0.3 10/08/2023 2003   EOSABS 0.1 10/08/2023 2003   EOSABS 0.2 01/12/2023 1046   BASOSABS 0.1 10/08/2023 2003   BASOSABS 0.1 01/12/2023 1046    ASSESSMENT AND PLAN: 1. Cough in adult (Primary) Patient with persistent dry cough x 1 month.  Differential diagnosis include postnasal drip, upper airway cough syndrome, allergies, GERD and asthma.  She reports that her GERD is under good control with the Prilosec.  She denies any postnasal drainage.  However we will give a trial of Flonase  nasal spray.  Advised to continue the Zyrtec  which she is taking over-the-counter.  Will also give a trial of albuterol  inhaler.  If symptoms do not resolve with these measures, she should let me know. - fluticasone  (FLONASE ) 50 MCG/ACT nasal spray; Place 1 spray into both nostrils daily for 1 to 2 weeks then as needed thereafer.  Dispense: 16 g; Refill: 0 - albuterol  (VENTOLIN  HFA) 108 (90 Base) MCG/ACT inhaler; Inhale 1-2 puffs into the lungs every 6 (six) hours as needed for wheezing or shortness of breath.  Dispense: 18 g; Refill: 0 - DG Chest 2 View; Future  2. Encounter for cervical Pap smear with pelvic exam We were unable to get  the Pap today due to severe atrophy and distortion of cervical appearance.  Will get her to gynecology to do the Pap smear.  She is also noted to have 2 hyperpigmented raised lesions on the mons pubis left side.  These look like they may be seborrheic keratosis.  Will have gynecology take a look at those as well. - Ambulatory referral to Gynecology  3. Vaginal lesion See #2 above - Ambulatory referral to Gynecology  4. Encounter for screening mammogram for malignant neoplasm of breast - MM 3D SCREENING MAMMOGRAM BILATERAL BREAST; Future   Patient was given the opportunity to ask questions.  Patient verbalized understanding of the plan and was able to repeat key elements of the plan.   This documentation was completed using Paediatric nurse.  Any transcriptional errors are unintentional.  No orders of the defined types were placed in this encounter.    Requested Prescriptions  No prescriptions requested or ordered in this encounter    No follow-ups on file.  Barnie Louder, MD, FACP

## 2024-06-13 ENCOUNTER — Encounter (HOSPITAL_COMMUNITY)
Admission: RE | Admit: 2024-06-13 | Discharge: 2024-06-13 | Disposition: A | Discharge status: Home / Self Care | Source: Ambulatory Visit | Attending: Internal Medicine | Admitting: Internal Medicine

## 2024-06-13 DIAGNOSIS — Z0389 Encounter for observation for other suspected diseases and conditions ruled out: Secondary | ICD-10-CM | POA: Diagnosis not present

## 2024-06-13 DIAGNOSIS — R1110 Vomiting, unspecified: Secondary | ICD-10-CM | POA: Diagnosis not present

## 2024-06-13 MED ORDER — TECHNETIUM TC 99M SULFUR COLLOID
2.2000 | Freq: Once | INTRAVENOUS | Status: AC | PRN
  Administered 2024-06-13: 2.2 via INTRAVENOUS

## 2024-06-14 ENCOUNTER — Ambulatory Visit: Payer: Self-pay | Admitting: Internal Medicine

## 2024-06-18 ENCOUNTER — Other Ambulatory Visit: Payer: Self-pay

## 2024-06-18 ENCOUNTER — Other Ambulatory Visit (HOSPITAL_BASED_OUTPATIENT_CLINIC_OR_DEPARTMENT_OTHER): Payer: Self-pay

## 2024-06-18 ENCOUNTER — Other Ambulatory Visit: Payer: Self-pay | Admitting: Internal Medicine

## 2024-06-18 DIAGNOSIS — R1110 Vomiting, unspecified: Secondary | ICD-10-CM

## 2024-06-18 DIAGNOSIS — R059 Cough, unspecified: Secondary | ICD-10-CM

## 2024-06-20 ENCOUNTER — Other Ambulatory Visit (HOSPITAL_BASED_OUTPATIENT_CLINIC_OR_DEPARTMENT_OTHER): Payer: Self-pay

## 2024-06-21 ENCOUNTER — Ambulatory Visit (INDEPENDENT_AMBULATORY_CARE_PROVIDER_SITE_OTHER): Admitting: Internal Medicine

## 2024-06-21 ENCOUNTER — Encounter: Payer: Self-pay | Admitting: Internal Medicine

## 2024-06-21 VITALS — BP 146/82 | HR 74 | Ht 64.0 in | Wt 102.6 lb

## 2024-06-21 DIAGNOSIS — E1159 Type 2 diabetes mellitus with other circulatory complications: Secondary | ICD-10-CM

## 2024-06-21 DIAGNOSIS — E2740 Unspecified adrenocortical insufficiency: Secondary | ICD-10-CM | POA: Diagnosis not present

## 2024-06-21 DIAGNOSIS — E1165 Type 2 diabetes mellitus with hyperglycemia: Secondary | ICD-10-CM | POA: Diagnosis not present

## 2024-06-21 DIAGNOSIS — Z794 Long term (current) use of insulin: Secondary | ICD-10-CM

## 2024-06-21 DIAGNOSIS — E039 Hypothyroidism, unspecified: Secondary | ICD-10-CM | POA: Diagnosis not present

## 2024-06-21 LAB — POCT GLYCOSYLATED HEMOGLOBIN (HGB A1C): Hemoglobin A1C: 7.3 % — AB (ref 4.0–5.6)

## 2024-06-21 NOTE — Addendum Note (Signed)
 Addended by: CLEOTILDE ROLIN RAMAN on: 06/21/2024 08:40 AM   Modules accepted: Orders

## 2024-06-21 NOTE — Patient Instructions (Addendum)
 Please use the following regimen: - Lantus  5 units in the morning  Start the sensor.  Please continue hydrocortisone  20 mg daily.  Please continue levothyroxine  150 mcg daily.  Take the thyroid  hormone every day, with water, at least 30 minutes before breakfast, separated by at least 4 hours from: - acid reflux medications - calcium  - iron  - multivitamins  Regarding hydrocortisone : - You absolutely need to take this medication every day and not skip doses. - Please double the dose if you have a fever, for the duration of the fever. - If you cannot take anything by mouth (vomiting) or you have severe diarrhea so that you eliminate the hydrocortisone  pills in your stool, please make sure that you get the hydrocortisone  in the vein instead - go to the nearest emergency department/urgent care or you may go to your PCPs office  - Please try to get a MedAlert bracelet or pendant indicating: Adrenal insufficiency.   Please return in 1.5 months.

## 2024-06-21 NOTE — Progress Notes (Signed)
 Patient ID: [name], female   DOB: [birth year], 63 y.o.   MRN: [id number]  HPI: [name] is a 63 y.o.-year-old female, returning for follow-up for DM2, h/o GDM, dx in 2004, insulin -dependent, uncontrolled, with complications (aortic atherosclerosis, CHF, CKD, history of hypoglycemia) and also adrenal insufficiency and hypothyroidism. Pt. previously saw Dr. Kassie, and then I saw the patient 1 year and 5 months ago.  She was lost to follow-up afterwards.  Interim history: Patient lost her insurance (Medicaid) since last visit and she just got it back. No increased urination, blurry vision, chest pain, but had recurrent vomiting and lost more than 10 pounds since last visit.  Of note, a gastric emptying study from 06/14/2024 was normal.  DM2:  Reviewed HbA1c: Lab Results  Component Value Date   HGBA1C 8.8 (A) 04/26/2024   HGBA1C (A) 05/13/2023     Comment:     >15.0   HGBA1C 9.4 (H) 01/12/2023   HGBA1C 5.0 02/03/2022   HGBA1C 5.3 10/16/2021   HGBA1C 8.6 (H) 02/13/2021   HGBA1C 6.6 10/29/2020   HGBA1C 9.2 (H) 03/28/2018   HGBA1C 5.3 07/08/2017   HGBA1C 5.6 05/09/2017   In 04/2023, she was started on: - Lantus  6 units daily at 6 pm only if sugars are high at night: >180-190 (only 3-4x a week) - Novolog  2 units before each meal >> only if sugars are high after b'fast She was on Repaglinide  in the past 2/2 multiple hypoglycemia episodes. She was on insulin  long time ago. She was on Metformin, but taken off to kidney dysfunction.  Pt checks her sugars 2-3x a day - will get a CGM (Libre 3+): - am: 54-74 >> 50s-70s - 2h after b'fast: n/c >> 100-110 - before lunch: 100-120 >> n/c - 2h after lunch: 140-150 >> 195 (pizza) - before dinner: 105-110 >> n/c - 2h after dinner: n/c >> 160-180 - bedtime: n/c - nighttime: n/c Lowest sugar was 54 >> 50s; she has hypoglycemia awareness at 70.  Highest sugar was 150 >> 195.  Glucometer: Accu-Chek  Pt's meals are: - Breakfast:  toast or crackers, tea - Lunch: soup or rice + chicken + veggies - Dinner: same or curry + rothis - Snacks: occasionally  - + CKD - sees nephrology, last BUN/creatinine:  Lab Results  Component Value Date   BUN 17 04/26/2024   BUN 16 10/08/2023   CREATININE 1.65 (H) 04/26/2024   CREATININE 1.53 (H) 10/08/2023   Lab Results  Component Value Date   MICRALBCREAT 3 04/26/2024   MICRALBCREAT 16 05/13/2023   MICRALBCREAT 39 (H) 10/29/2020  She is not on ACE inhibitor/ARB.  -+ Significant HL; last set of lipids: Lab Results  Component Value Date   CHOL 190 04/26/2024   HDL 27 (L) 04/26/2024   LDLCALC 128 (H) 04/26/2024   TRIG 194 (H) 04/26/2024   CHOLHDL 7.0 (H) 04/26/2024  She was previously off statins due to transaminitis.  At last visit, I suggested Zetia  10 mg daily.  However, she is now on Crestor  10 mg daily.   Latest LFTs were normal: Lab Results  Component Value Date   ALT 11 04/26/2024   AST 18 04/26/2024   ALKPHOS 101 04/26/2024   BILITOT 0.3 04/26/2024    - last eye exam was in 2023. No DR reportedly. + cataracts.  - no numbness and tingling in her feet.  Last foot exam 05/24/2024 by Dr. Loreda.  GES (06/14/2024): Normal  + Extensive FH of DM and prediabetes, including in  daughters.  Hypothyroidism:  Pt is on levothyroxine  150 mcg daily, taken: - in am - fasting - stopped coffee - at least 30 min from b'fast - + calcium , MVI, PPI in am!! - no iron  - not on Biotin  Reviewed her TSH levels: Lab Results  Component Value Date   TSH 2.940 04/26/2024   TSH 0.05 (L) 03/02/2023   TSH 0.341 (L) 01/12/2023   TSH 15.900 (H) 03/01/2022   TSH 4.810 (H) 01/18/2022   TSH 1.738 06/02/2021   TSH 0.989 05/13/2021   TSH 3.03 02/13/2021   TSH 17.700 (H) 10/29/2020   TSH 6.165 (H) 03/28/2018   TSH 3.193 05/10/2017   TSH 4.233 06/13/2011   TSH 3.862 06/12/2011   TSH 17.622 (H) 05/11/2011   + FH of thyroid  ds. In niece and nephew. No FH of ThyCA.  Adrenal  insufficiency:  - dx'ed in 2012, during hospital stay  for cardioarrhythmia. - She came off Hydrocortisone  in 2022 - as she had a period w/o insurance.  However, she was started back on hydrocortisone  by Dr. Kassie in 01/2022 - She is on: Hydrocortisone  30 >> 20 mg daily in am -she did decrease the dose as advised at last visit  Reviewed previous investigation:   It appears that the 01/2022 set of labs were checked on hydrocortisone , but I am not sure if the rest of the measurements were checked off or on hydrocortisone .  However, I believe that the levels obtained in 2012 were checked before starting hydrocortisone : Component     Latest Ref Rng 05/11/2011 05/12/2011 06/12/2011  Cortisol, Base     ug/dL  0.5.    Cortisol, 30 Min     >20 ug/dL  85.2    Cortisol, 60 Min     >20 ug/dL  84.8    Cortisol - AM     4.3 - 22.4 ug/dL 0.5 (L)     Cortisol, Plasma     ug/dL   6.5     Her [id number] antibodies were not elevated in 12/2022 (see below).  ROS: + see HPI No increased urination, blurry vision, chest pain. She has chronic nausea.  Past Medical History:  Diagnosis Date   Adrenal insufficiency, primary (HCC)    Anemia 2018   Hgb 6.8 early 11/2020.  PRBC x 1.  06/2017 PRBC x 1.     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/2021   Stage 4 as of late May 2022   Diabetes mellitus without complication (HCC)    Hypertension    Hypothyroidism    Past Surgical History:  Procedure Laterality Date   CESAREAN SECTION     IR THORACENTESIS ASP PLEURAL SPACE W/IMG GUIDE  06/05/2021   Social History   Socioeconomic History   Marital status: Single    Spouse name: Not on file   Number of children: 5   Years of education: Not on file   Highest education level: High school graduate  Occupational History   Not on file  Tobacco Use   Smoking status: Never   Smokeless  tobacco: Never  Vaping Use   Vaping status: Never Used  Substance and Sexual Activity   Alcohol use: No   Drug use: No   Sexual activity: Not on file  Other Topics Concern   Not on file  Social History Narrative   01/06/22 lives with son and his  family   Social Drivers of Corporate investment banker Strain: Low Risk  (04/26/2024)   Overall Financial Resource Strain (CARDIA)    Difficulty of Paying Living Expenses: Not hard at all  Food Insecurity: No Food Insecurity (04/26/2024)   Hunger Vital Sign    Worried About Running Out of Food in the Last Year: Never true    Ran Out of Food in the Last Year: Never true  Transportation Needs: No Transportation Needs (04/26/2024)   PRAPARE - Administrator, Civil Service (Medical): No    Lack of Transportation (Non-Medical): No  Physical Activity: Insufficiently Active (04/26/2024)   Exercise Vital Sign    Days of Exercise per Week: 2 days    Minutes of Exercise per Session: 30 min  Stress: No Stress Concern Present (04/26/2024)   [employer] of Occupational Health - Occupational Stress Questionnaire    Feeling of Stress : Not at all  Social Connections: Socially Isolated (04/26/2024)   Social Connection and Isolation Panel    Frequency of Communication with Friends and Family: More than three times a week    Frequency of Social Gatherings with Friends and Family: More than three times a week    Attends Religious Services: Never    Database administrator or Organizations: No    Attends Banker Meetings: Never    Marital Status: Divorced  Catering manager Violence: Not At Risk (04/26/2024)   Humiliation, Afraid, Rape, and Kick questionnaire    Fear of Current or Ex-Partner: No    Emotionally Abused: No    Physically Abused: No    Sexually Abused: No   Current Outpatient Medications on File Prior to Visit  Medication Sig Dispense Refill   Accu-Chek Softclix Lancets lancets Use as instructed 100 each 12   albuterol   (VENTOLIN  HFA) 108 (90 Base) MCG/ACT inhaler Inhale 1-2 puffs into the lungs every 6 (six) hours as needed for wheezing or shortness of breath. 18 g 0   amLODipine  (NORVASC ) 10 MG tablet Take 1 tablet (10 mg total) by mouth daily. 90 tablet 1   aspirin  81 MG chewable tablet Chew 81 mg by mouth daily.     Calcium  Carbonate-Vit D-Min (CALCIUM  1200 PO) Take 1,200 mg by mouth daily.     Continuous Glucose Sensor (FREESTYLE LIBRE 3 PLUS SENSOR) MISC Change sensor every 15 days. 2 each 10   fluticasone  (FLONASE ) 50 MCG/ACT nasal spray Place 1 spray into both nostrils daily for 1 to 2 weeks then as needed thereafer. 16 g 0   Glucagon  3 MG/DOSE POWD Place 3 mg into the nose once as needed for up to 1 dose. 1 each 11   hydrALAZINE  (APRESOLINE ) 25 MG tablet Take 1 tablet (25 mg total) by mouth 3 (three) times daily. 270 tablet 5   hydrocortisone  (CORTEF ) 10 MG tablet Take 2-3 tablets (20-30 mg total) by mouth daily. 300 tablet 3   insulin  aspart (NOVOLOG  FLEXPEN) 100 UNIT/ML FlexPen 2 units subcut before meals.  Hold if BS before meal less than 180 15 mL 11   insulin  glargine (LANTUS  SOLOSTAR) 100 UNIT/ML Solostar Pen Inject 6 Units into the skin at bedtime. 15 mL PRN   Insulin  Pen Needle (PEN NEEDLES) 31G X 8 MM MISC UAD 100 each 6   isosorbide  mononitrate (IMDUR ) 120 MG 24 hr tablet Take 1 tablet (120 mg total) by mouth daily. 90 tablet 3   levothyroxine  (SYNTHROID ) 150 MCG tablet TAKE 1 TABLET BY MOUTH EVERY  DAY BEFORE BREAKFAST 90 tablet 3   Multiple Vitamin (MULTIVITAMIN WITH MINERALS) TABS tablet Take 1 tablet by mouth daily.     omeprazole  (PRILOSEC) 20 MG capsule Take 1 capsule (20 mg total) by mouth daily. 30 capsule 4   ondansetron  (ZOFRAN -ODT) 4 MG disintegrating tablet Take 1 tablet (4 mg total) by mouth 2 (two) times daily as needed for nausea or vomiting. 20 tablet 1   rosuvastatin  (CRESTOR ) 10 MG tablet Take 1 tablet (10 mg total) by mouth daily. Stop Zetia  90 tablet 3   sertraline  (ZOLOFT ) 50  MG tablet Take 1 tablet (50 mg total) by mouth daily. 90 tablet 1   torsemide  (DEMADEX ) 20 MG tablet Take 1 tablet (20 mg total) by mouth daily as needed (for weight gain of 3lbs in 1 day or 5 lbs in 2 days) 90 tablet 1   traMADol  (ULTRAM ) 50 MG tablet Take 1 tablet (50 mg total) by mouth 2 (two) times daily as needed. 60 tablet 1   vitamin B-12 (CYANOCOBALAMIN ) 1000 MCG tablet Take 1 tablet (1,000 mcg total) by mouth daily. 30 tablet 0   No current facility-administered medications on file prior to visit.   Allergies  Allergen Reactions   Beef-Derived Drug Products     RELIGOUS  REASONS   Pork-Derived Products     RELIGIOUS REASONS   Percocet [Oxycodone -Acetaminophen ] Rash   Family History  Problem Relation Age of Onset   Diabetes Mother    Hyperlipidemia Father    Hypertension Father    Adrenal disorder Neg Hx    Colon polyps Neg Hx    Colon cancer Neg Hx    Esophageal cancer Neg Hx    Rectal cancer Neg Hx    Stomach cancer Neg Hx     PE: BP (!) 146/82   Pulse 74   Ht 5' 4 (1.626 m)   Wt 102 lb 9.6 oz (46.5 kg)   SpO2 99%   BMI 17.61 kg/m  Wt Readings from Last 15 Encounters:  06/21/24 102 lb 9.6 oz (46.5 kg)  06/11/24 102 lb (46.3 kg)  05/11/24 103 lb 14.4 oz (47.1 kg)  04/26/24 107 lb (48.5 kg)  10/08/23 114 lb (51.7 kg)  07/07/23 113 lb (51.3 kg)  05/13/23 113 lb (51.3 kg)  01/13/23 116 lb 3.2 oz (52.7 kg)  01/12/23 118 lb 12.8 oz (53.9 kg)  07/30/22 100 lb 1.4 oz (45.4 kg)  07/17/22 100 lb 1.4 oz (45.4 kg)  03/01/22 100 lb 2 oz (45.4 kg)  02/22/22 100 lb 6.4 oz (45.5 kg)  02/10/22 97 lb (44 kg)  02/03/22 97 lb (44 kg)   Constitutional: Thin, in NAD Eyes:  EOMI, no exophthalmos ENT: no neck masses, no cervical lymphadenopathy Cardiovascular: RRR, No MRG Respiratory: CTA B Musculoskeletal: no deformities Skin:no rashes Neurological: no tremor with outstretched hands  ASSESSMENT: 1. DM, non-insulin -dependent, uncontrolled, with complications -  CHF - Aortic atherosclerosis - CKD stage III-IV  - No autoimmunity or insulin  deficiency: Component     Latest Ref Rng 01/13/2023  Glucose     65 - 99 mg/dL 823 (H)   Glutamic Acid Decarb Ab     <5 IU/mL <5   ZNT8 Antibodies     <15 U/mL <10   IA-2 Antibody     <5.4 U/mL <5.4   Insulin  Antibodies, Human     <0.4 U/mL <0.4   C-Peptide     0.80 - 3.85 ng/mL 1.29    2.  Hypothyroidism - No  autoimmunity: Component     Latest Ref Rng 01/13/2023  Thyroglobulin Ab     < or = 1 IU/mL <1   Thyroperoxidase Ab SerPl-aCnc     <9 IU/mL 2    3.  Adrenal insufficiency - No autoimmunity: Component     Latest Ref Rng 01/13/2023  21-Hydroxylase Antibodies     NEGATIVE  NEGATIVE    4. HL - has FH of this   PLAN:  1.  Complex patient with longstanding, uncontrolled diabetes, not on any diabetic medications at last visit, when an HbA1c returned high, at 9.4%, increased from 5.0%.  She reported that her blood sugars were at home either at goal or low, down to the 50s.  In the clinic, point-of-care glucose was 160.  I advised her to change her test strips but I did not suggest to start the medication at that time, pending gathering more data.  Also, I was worried about starting an SGLT2 inhibitor or GLP-1 receptor agonist due to low body weight and hypoglycemic values.  She also had a decreased appetite.  We checked her for insulin  deficiency and autoimmunity and investigation was negative.  She had another HbA1c which was still elevated, but lower, 2 months ago, at 8.8%. -Since last visit, I received a message from PCP in 04/2023 that HbA1c was undetectably high (about 15%).  She was started on Lantus  and NovoLog  regimen.  However, she tells me that she is not taking Lantus  consistently, only approximately half of the time, and only if sugars are high at bedtime, above 180.  We discussed that Lantus  is a long-acting insulin  that needs to be taken every day.  Since she does report low blood sugars in  the morning, in the 50s to 70s (although I am not sure that these are completely accurate), I did advise her to decrease the dose and move it to the morning.  She is taking NovoLog  only once a day, after breakfast, only if the sugars are high after this meal.  I advised her to stop NovoLog  based on the blood sugars at home and the significantly improved HbA1c today (see below).  Daughter is mentioning that they have a CGM waiting for them at the pharmacy.  This is paramount to understand her diabetic control. - I suggested to:  Patient Instructions  Please use the following regimen: - Lantus  5 units in the morning  Start the sensor.  Please continue hydrocortisone  20 mg daily.  Please continue levothyroxine  150 mcg daily.  Take the thyroid  hormone every day, with water, at least 30 minutes before breakfast, separated by at least 4 hours from: - acid reflux medications - calcium  - iron  - multivitamins  Regarding hydrocortisone : - You absolutely need to take this medication every day and not skip doses. - Please double the dose if you have a fever, for the duration of the fever. - If you cannot take anything by mouth (vomiting) or you have severe diarrhea so that you eliminate the hydrocortisone  pills in your stool, please make sure that you get the hydrocortisone  in the vein instead - go to the nearest emergency department/urgent care or you may go to your PCPs office  - Please try to get a MedAlert bracelet or pendant indicating: Adrenal insufficiency.   Please return in 1.5 months.  - we checked her HbA1c: 7.3% (much better) - advised to check sugars at different times of the day - 4x a day, rotating check times - advised for yearly eye  exams >> she is UTD - At last visit, we reviewed her lipid panel and she had a very high LDL.  She was not on statins due to history of transaminitis with these.  I recommended to start Zetia  10 mg daily.  However, she is now on Crestor  10 mg daily. -  return to clinic in 1.5 months  2.  Hypothyroidism - latest thyroid  labs reviewed with pt. >> normal: Lab Results  Component Value Date   TSH 2.940 04/26/2024  - she continues on LT4 150 mcg daily, dose decreased at last visit - pt feels good on this dose. - we discussed about taking the thyroid  hormone every day, with water, >30 minutes before breakfast, separated by >4 hours from acid reflux medications, calcium , iron , multivitamins. Pt. is not taking it correctly, she takes calcium , multivitamins, and PPIs, only approximately 30 minutes to an hour from levothyroxine .  I advised them to move these later in the day, at least 4 hours after breakfast.  I plan to recheck her TFTs at next visit.  3.  Adrenal insufficiency -Patient with history of primary adrenal insufficiency per review of Dr. Laymond notes.  This was diagnosed in 2012.  She was initially started on hydrocortisone  and she continued to this for 10 years.  She was briefly off the medication after cortisol levels were normal, but had to restart in 2022.  Reviewing the labs from 2012, she had a low a.m. cortisol, but it stimulated to 15, which, per today's standards is considered normal.  I do not have an ACTH  from 2012, but this was low in 2022.  However, it is unclear whether it was checked off hydrocortisone  at that time.  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 - Was previously taking 30 mg of hydrocortisone  daily. We discussed that this is a high dose for her BMI and we should try to decrease the dose slowly, to the minimum dose that allows her to feel well.  I gave her a protocol to decrease her hydrocortisone  dose to 20 mg daily.  She is currently on this dose. - She lost 14 pounds since our last visit.  At last visit , she had nausea and headaches and I advised her to take  hydrocortisone  right before meals. - She is used to taking hydrocortisone  just once a day so we will continue this for now - We discussed with patient and her daughter about sick day rules: - You absolutely need to take this medication every day and not skip doses. - Please double the dose if you have a fever, for the duration of the fever. - If you cannot take anything by mouth (vomiting) or you have severe diarrhea so that you eliminate the hydrocortisone  pills in your stool, please make sure that you get the hydrocortisone  in the vein instead - go to the nearest emergency department/urgent care or you may go to your PCPs office  - Please try to get a MedAlert bracelet or pendant indicating: Adrenal insufficiency.  -We also discussed about trying to stay on the lowest dose of hydrocortisone  that allows her to feel well.  We reviewed the possible side effects of taking too much steroid. - Of note, at last visit we checked her 21-hydroxylase antibodies and these were negative, ruling out Addison's disease  - time spent for the  visit: 40 min, in precharting, post charting, reviewing her previous labs, imaging evaluations, office visit notes, communications with PCP, and previous treatments, counseling her about her endocrine conditions (please see the discussed topics above), and developing a plan to investigate and treat them.   Lela Fendt, MD PhD [facility] Endocrinology

## 2024-06-26 ENCOUNTER — Encounter (HOSPITAL_BASED_OUTPATIENT_CLINIC_OR_DEPARTMENT_OTHER): Payer: Self-pay

## 2024-06-26 ENCOUNTER — Ambulatory Visit: Admitting: Internal Medicine

## 2024-06-26 ENCOUNTER — Encounter: Payer: Self-pay | Admitting: Internal Medicine

## 2024-06-27 ENCOUNTER — Other Ambulatory Visit: Payer: Self-pay

## 2024-06-27 ENCOUNTER — Other Ambulatory Visit (HOSPITAL_BASED_OUTPATIENT_CLINIC_OR_DEPARTMENT_OTHER): Payer: Self-pay

## 2024-06-27 MED ORDER — BAQSIMI TWO PACK 3 MG/DOSE NA POWD
3.0000 mg | Freq: Once | NASAL | 11 refills | Status: AC | PRN
  Filled 2024-06-27: qty 2, 30d supply, fill #0
  Filled 2024-06-27: qty 1, 1d supply, fill #0

## 2024-06-28 ENCOUNTER — Encounter: Payer: Self-pay | Admitting: Internal Medicine

## 2024-06-30 DIAGNOSIS — Z419 Encounter for procedure for purposes other than remedying health state, unspecified: Secondary | ICD-10-CM | POA: Diagnosis not present

## 2024-07-05 ENCOUNTER — Ambulatory Visit: Admitting: Internal Medicine

## 2024-07-10 ENCOUNTER — Other Ambulatory Visit: Payer: Self-pay

## 2024-07-11 ENCOUNTER — Telehealth (HOSPITAL_BASED_OUTPATIENT_CLINIC_OR_DEPARTMENT_OTHER): Payer: Self-pay | Admitting: *Deleted

## 2024-07-11 NOTE — Telephone Encounter (Signed)
 [name], NP  P Cv Div Dwb Triage Recommend phone call to check in as has not read MyChart message. Would recommend follow up with primary care provider or endocrinology. EKG shows normal sinus rhythm, no acute findings - stable from previous.  [name], NP       Previous Messages    ----- Message ----- From: Tanda Erma HERO, RN Sent: 06/27/2024 To: Darrel Nissen Subject: Unread Message Notification                       This message is to inform you that the patient has not yet read the following message. (Notification date: July 11, 2024)    Recent EMT visit  From [name], RN To [name], [name] Sent and Delivered 06/27/2024  7:21 AM      Hi there,    Do you have any idea why her sugar was so low? Has her heart rate been running this low or this a new occurrence? Is she having any other symptoms?    Best, [name], RN     Previous Messages  ----- Message -----      From:[name]      Sent:06/26/2024  7:48 PM EDT        To:[name]   Subject:Recent EMT visit  Good evening we just had a EMT come by since her blood glucose was 26. They did an ECG because her heart rate was 40, they printed out a copy and I'm just gonna attach it in case you need to see her. I know everything was good at her last visit, but I just wanted to verify.   Left message to call back

## 2024-07-17 ENCOUNTER — Other Ambulatory Visit: Payer: Self-pay

## 2024-07-17 ENCOUNTER — Other Ambulatory Visit (HOSPITAL_COMMUNITY): Payer: Self-pay

## 2024-07-18 ENCOUNTER — Other Ambulatory Visit: Payer: Self-pay

## 2024-07-19 ENCOUNTER — Other Ambulatory Visit: Payer: Self-pay

## 2024-07-20 ENCOUNTER — Other Ambulatory Visit: Payer: Self-pay

## 2024-07-23 ENCOUNTER — Other Ambulatory Visit (HOSPITAL_COMMUNITY): Payer: Self-pay

## 2024-07-31 DIAGNOSIS — Z419 Encounter for procedure for purposes other than remedying health state, unspecified: Secondary | ICD-10-CM | POA: Diagnosis not present

## 2024-08-08 ENCOUNTER — Encounter: Payer: Self-pay | Admitting: Internal Medicine

## 2024-08-08 ENCOUNTER — Ambulatory Visit (INDEPENDENT_AMBULATORY_CARE_PROVIDER_SITE_OTHER): Admitting: Internal Medicine

## 2024-08-08 ENCOUNTER — Other Ambulatory Visit: Payer: Self-pay

## 2024-08-08 VITALS — BP 136/70 | HR 70 | Ht 64.0 in | Wt 105.0 lb

## 2024-08-08 DIAGNOSIS — E039 Hypothyroidism, unspecified: Secondary | ICD-10-CM

## 2024-08-08 DIAGNOSIS — E1159 Type 2 diabetes mellitus with other circulatory complications: Secondary | ICD-10-CM | POA: Diagnosis not present

## 2024-08-08 DIAGNOSIS — Z7984 Long term (current) use of oral hypoglycemic drugs: Secondary | ICD-10-CM | POA: Diagnosis not present

## 2024-08-08 DIAGNOSIS — E2740 Unspecified adrenocortical insufficiency: Secondary | ICD-10-CM

## 2024-08-08 DIAGNOSIS — E1165 Type 2 diabetes mellitus with hyperglycemia: Secondary | ICD-10-CM | POA: Diagnosis not present

## 2024-08-08 LAB — T4, FREE: Free T4: 1 ng/dL (ref 0.8–1.8)

## 2024-08-08 LAB — TSH: TSH: 7.07 m[IU]/L — ABNORMAL HIGH (ref 0.40–4.50)

## 2024-08-08 NOTE — Patient Instructions (Addendum)
 Please use the following regimen: - Lantus  5 units  before b'fast - Novolog  3-4 units before b'fast and dinner  Please continue hydrocortisone  20 mg daily.  Please continue levothyroxine  150 mcg daily.  Take the thyroid  hormone every day, with water, at least 30 minutes before breakfast, separated by at least 4 hours from: - acid reflux medications - calcium  - iron  - multivitamins  Regarding hydrocortisone : - You absolutely need to take this medication every day and not skip doses. - Please double the dose if you have a fever, for the duration of the fever. - If you cannot take anything by mouth (vomiting) or you have severe diarrhea so that you eliminate the hydrocortisone  pills in your stool, please make sure that you get the hydrocortisone  in the vein instead - go to the nearest emergency department/urgent care or you may go to your PCPs office  - Please try to get a MedAlert bracelet or pendant indicating: Adrenal insufficiency.   Please return in 3 months.

## 2024-08-08 NOTE — Telephone Encounter (Signed)
 Patient seen by endocrinology today.

## 2024-08-08 NOTE — Progress Notes (Signed)
 Patient ID: [name], female   DOB: Apr 27, 1961, 63 y.o.   MRN: [id number]  HPI: [name] is a 63 y.o.-year-old female, returning for follow-up for DM2, h/o GDM, dx in 2004, insulin -dependent, uncontrolled, with complications (aortic atherosclerosis, CHF, CKD, history of hypoglycemia) and also adrenal insufficiency and hypothyroidism. Pt. previously saw Dr. Kassie.  Last visit with me was 1.5 months ago.  She is here with her daughter who offers part of the history and gives more information about patient's blood sugars and activity.  Interim history: Patient lost her insurance (Medicaid) since last visit and she just got it back before our last visit. No increased urination, increased thirst, chest pain, but had recurrent vomiting and lost more than 10 pounds before last visit.  She feels that her nausea has improved.  She has occasional blurry vision.  DM2:  Reviewed HbA1c: Lab Results  Component Value Date   HGBA1C 7.3 (A) 06/21/2024   HGBA1C 8.8 (A) 04/26/2024   HGBA1C (A) 05/13/2023     Comment:     >15.0   HGBA1C 9.4 (H) 01/12/2023   HGBA1C 5.0 02/03/2022   HGBA1C 5.3 10/16/2021   HGBA1C 8.6 (H) 02/13/2021   HGBA1C 6.6 10/29/2020   HGBA1C 9.2 (H) 03/28/2018   HGBA1C 5.3 07/08/2017   In 04/2023, she was started on: - Lantus  6 units daily at 6 pm only if sugars are high at night: >180-190 (only 3-4x a week) - Novolog  2 units before each meal >> only if sugars are high after b'fast She was on Repaglinide  in the past 2/2 multiple hypoglycemia episodes. She was on insulin  long time ago. She was on Metformin, but taken off to kidney dysfunction.  In 06/2024 we changed the regimen: - Lantus  5 >> 4.5 units in a.m.  She is also taking: - Aspart 2 units before dinner  Pt checks her sugars >4x a day with her libre CGM:  -  Prev. - am: 54-74 >> 50s-70s - 2h after b'fast: n/c >> 100-110 - before lunch: 100-120 >> n/c - 2h after lunch: 140-150 >> 195 (pizza) - before  dinner: 105-110 >> n/c - 2h after dinner: n/c >> 160-180 - bedtime: n/c - nighttime: n/c Lowest sugar was 54 >> 50s >> 40s; she has hypoglycemia awareness at 70.  Highest sugar was 150 >> 195 >> >350.  Glucometer: Accu-Chek  Pt's meals are: - Breakfast: toast or crackers, tea - Lunch: soup or rice + chicken + veggies - Dinner: same or curry + rothis - Snacks: occasionally  - + CKD - sees nephrology, last BUN/creatinine:  Lab Results  Component Value Date   BUN 17 04/26/2024   BUN 16 10/08/2023   CREATININE 1.65 (H) 04/26/2024   CREATININE 1.53 (H) 10/08/2023   Lab Results  Component Value Date   MICRALBCREAT 3 04/26/2024   MICRALBCREAT 16 05/13/2023   MICRALBCREAT 39 (H) 10/29/2020  She is not on ACE inhibitor/ARB.  -+ Significant HL; last set of lipids: Lab Results  Component Value Date   CHOL 190 04/26/2024   HDL 27 (L) 04/26/2024   LDLCALC 128 (H) 04/26/2024   TRIG 194 (H) 04/26/2024   CHOLHDL 7.0 (H) 04/26/2024  She was previously off statins due to transaminitis.  At last visit, I suggested Zetia  10 mg daily.  However, she is now on Crestor  10 mg daily.   Latest LFTs were normal: Lab Results  Component Value Date   ALT 11 04/26/2024   AST 18 04/26/2024   ALKPHOS 101  04/26/2024   BILITOT 0.3 04/26/2024    - last eye exam was in 2023. No DR reportedly. + cataracts.  - no numbness and tingling in her feet.  Last foot exam 05/24/2024 by Dr. Loreda.  GES (06/14/2024): Normal  + Extensive FH of DM and prediabetes, including in daughters.  Hypothyroidism:  Pt is on levothyroxine  150 mcg daily, taken: - in am - fasting - stopped coffee - at least 30 min from b'fast - + calcium , MVI, PPI in am!! >>  Moved later in the day - no iron  - not on Biotin  Reviewed her TSH levels: Lab Results  Component Value Date   TSH 2.940 04/26/2024   TSH 0.05 (L) 03/02/2023   TSH 0.341 (L) 01/12/2023   TSH 15.900 (H) 03/01/2022   TSH 4.810 (H) 01/18/2022   TSH 1.738  06/02/2021   TSH 0.989 05/13/2021   TSH 3.03 02/13/2021   TSH 17.700 (H) 10/29/2020   TSH 6.165 (H) 03/28/2018   TSH 3.193 05/10/2017   TSH 4.233 06/13/2011   TSH 3.862 06/12/2011   TSH 17.622 (H) 05/11/2011   + FH of thyroid  ds. In niece and nephew. No FH of ThyCA.  Adrenal insufficiency:  - dx'ed in 2012, during hospital stay  for cardioarrhythmia. - She came off Hydrocortisone  in 2022 - as she had a period w/o insurance.  However, she was started back on hydrocortisone  by Dr. Kassie in 01/2022 - She is on: Hydrocortisone  30 >> now 20 mg daily in am  Reviewed previous investigation:   It appears that the 01/2022 set of labs were checked on hydrocortisone , but I am not sure if the rest of the measurements were checked off or on hydrocortisone .  However, I believe that the levels obtained in 2012 were checked before starting hydrocortisone : Component     Latest Ref Rng 05/11/2011 05/12/2011 06/12/2011  Cortisol, Base     ug/dL  0.5.    Cortisol, 30 Min     >20 ug/dL  85.2    Cortisol, 60 Min     >20 ug/dL  84.8    Cortisol - AM     4.3 - 22.4 ug/dL 0.5 (L)     Cortisol, Plasma     ug/dL   6.5     Her [id number] antibodies were not elevated in 12/2022 (see below).  ROS: + see HPI No increased urination, blurry vision, chest pain. She has chronic nausea.  Past Medical History:  Diagnosis Date   Adrenal insufficiency, primary (HCC)    Anemia 2018   Hgb 6.8 early 11/2020.  PRBC x 1.  06/2017 PRBC x 1.     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/2021   Stage 4 as of late May 2022   Diabetes mellitus without complication (HCC)    Hypertension    Hypothyroidism    Past Surgical History:  Procedure Laterality Date   CESAREAN SECTION     IR THORACENTESIS ASP PLEURAL SPACE W/IMG GUIDE  06/05/2021   Social History   Socioeconomic History   Marital  status: Single    Spouse name: Not on file   Number of children: 5   Years of education: Not on file   Highest education level: High school graduate  Occupational History   Not on file  Tobacco Use   Smoking status: Never   Smokeless tobacco: Never  Vaping  Use   Vaping status: Never Used  Substance and Sexual Activity   Alcohol use: No   Drug use: No   Sexual activity: Not on file  Other Topics Concern   Not on file  Social History Narrative   01/06/22 lives with son and his family   Social Drivers of Corporate investment banker Strain: Low Risk  (04/26/2024)   Overall Financial Resource Strain (CARDIA)    Difficulty of Paying Living Expenses: Not hard at all  Food Insecurity: No Food Insecurity (04/26/2024)   Hunger Vital Sign    Worried About Running Out of Food in the Last Year: Never true    Ran Out of Food in the Last Year: Never true  Transportation Needs: No Transportation Needs (04/26/2024)   PRAPARE - Administrator, Civil Service (Medical): No    Lack of Transportation (Non-Medical): No  Physical Activity: Insufficiently Active (04/26/2024)   Exercise Vital Sign    Days of Exercise per Week: 2 days    Minutes of Exercise per Session: 30 min  Stress: No Stress Concern Present (04/26/2024)   [employer] of Occupational Health - Occupational Stress Questionnaire    Feeling of Stress : Not at all  Social Connections: Socially Isolated (04/26/2024)   Social Connection and Isolation Panel    Frequency of Communication with Friends and Family: More than three times a week    Frequency of Social Gatherings with Friends and Family: More than three times a week    Attends Religious Services: Never    Database administrator or Organizations: No    Attends Banker Meetings: Never    Marital Status: Divorced  Catering manager Violence: Not At Risk (04/26/2024)   Humiliation, Afraid, Rape, and Kick questionnaire    Fear of Current or Ex-Partner: No     Emotionally Abused: No    Physically Abused: No    Sexually Abused: No   Current Outpatient Medications on File Prior to Visit  Medication Sig Dispense Refill   Accu-Chek Softclix Lancets lancets Use as instructed 100 each 12   albuterol  (VENTOLIN  HFA) 108 (90 Base) MCG/ACT inhaler Inhale 1-2 puffs into the lungs every 6 (six) hours as needed for wheezing or shortness of breath. 18 g 0   amLODipine  (NORVASC ) 10 MG tablet Take 1 tablet (10 mg total) by mouth daily. 90 tablet 1   aspirin  81 MG chewable tablet Chew 81 mg by mouth daily.     Calcium  Carbonate-Vit D-Min (CALCIUM  1200 PO) Take 1,200 mg by mouth daily.     Continuous Glucose Sensor (FREESTYLE LIBRE 3 PLUS SENSOR) MISC Change sensor every 15 days. 2 each 10   fluticasone  (FLONASE ) 50 MCG/ACT nasal spray Place 1 spray into both nostrils daily for 1 to 2 weeks then as needed thereafer. 16 g 0   Glucagon  (BAQSIMI  TWO PACK) 3 MG/DOSE POWD Place 3 mg into the nose once as needed for up to 1 dose. 1 each 11   hydrALAZINE  (APRESOLINE ) 25 MG tablet Take 1 tablet (25 mg total) by mouth 3 (three) times daily. 270 tablet 5   hydrocortisone  (CORTEF ) 10 MG tablet Take 2-3 tablets (20-30 mg total) by mouth daily. 300 tablet 3   insulin  aspart (NOVOLOG  FLEXPEN) 100 UNIT/ML FlexPen 2 units subcut before meals.  Hold if BS before meal less than 180 15 mL 11   insulin  glargine (LANTUS  SOLOSTAR) 100 UNIT/ML Solostar Pen Inject 6 Units into the skin at bedtime. 15  mL PRN   Insulin  Pen Needle (PEN NEEDLES) 31G X 8 MM MISC UAD 100 each 6   isosorbide  mononitrate (IMDUR ) 120 MG 24 hr tablet Take 1 tablet (120 mg total) by mouth daily. 90 tablet 3   levothyroxine  (SYNTHROID ) 150 MCG tablet TAKE 1 TABLET BY MOUTH EVERY DAY BEFORE BREAKFAST 90 tablet 3   Multiple Vitamin (MULTIVITAMIN WITH MINERALS) TABS tablet Take 1 tablet by mouth daily.     omeprazole  (PRILOSEC) 20 MG capsule Take 1 capsule (20 mg total) by mouth daily. 30 capsule 4   ondansetron   (ZOFRAN -ODT) 4 MG disintegrating tablet Take 1 tablet (4 mg total) by mouth 2 (two) times daily as needed for nausea or vomiting. 20 tablet 1   rosuvastatin  (CRESTOR ) 10 MG tablet Take 1 tablet (10 mg total) by mouth daily. Stop Zetia  90 tablet 3   sertraline  (ZOLOFT ) 50 MG tablet Take 1 tablet (50 mg total) by mouth daily. 90 tablet 1   torsemide  (DEMADEX ) 20 MG tablet Take 1 tablet (20 mg total) by mouth daily as needed (for weight gain of 3lbs in 1 day or 5 lbs in 2 days) 90 tablet 1   traMADol  (ULTRAM ) 50 MG tablet Take 1 tablet (50 mg total) by mouth 2 (two) times daily as needed. 60 tablet 1   vitamin B-12 (CYANOCOBALAMIN ) 1000 MCG tablet Take 1 tablet (1,000 mcg total) by mouth daily. 30 tablet 0   No current facility-administered medications on file prior to visit.   Allergies  Allergen Reactions   Beef-Derived Drug Products     RELIGOUS  REASONS   Pork-Derived Products     RELIGIOUS REASONS   Percocet [Oxycodone -Acetaminophen ] Rash   Family History  Problem Relation Age of Onset   Diabetes Mother    Hyperlipidemia Father    Hypertension Father    Adrenal disorder Neg Hx    Colon polyps Neg Hx    Colon cancer Neg Hx    Esophageal cancer Neg Hx    Rectal cancer Neg Hx    Stomach cancer Neg Hx    PE: BP 136/70 (BP Location: Left Arm, Patient Position: Sitting, Cuff Size: Normal)   Pulse 70   Ht 5' 4 (1.626 m)   Wt 105 lb (47.6 kg)   SpO2 98%   BMI 18.02 kg/m  Wt Readings from Last 15 Encounters:  08/08/24 105 lb (47.6 kg)  06/21/24 102 lb 9.6 oz (46.5 kg)  06/11/24 102 lb (46.3 kg)  05/11/24 103 lb 14.4 oz (47.1 kg)  04/26/24 107 lb (48.5 kg)  10/08/23 114 lb (51.7 kg)  07/07/23 113 lb (51.3 kg)  05/13/23 113 lb (51.3 kg)  01/13/23 116 lb 3.2 oz (52.7 kg)  01/12/23 118 lb 12.8 oz (53.9 kg)  07/30/22 100 lb 1.4 oz (45.4 kg)  07/17/22 100 lb 1.4 oz (45.4 kg)  03/01/22 100 lb 2 oz (45.4 kg)  02/22/22 100 lb 6.4 oz (45.5 kg)  02/10/22 97 lb (44 kg)    Constitutional: Thin, in NAD Eyes:  EOMI, no exophthalmos ENT: no neck masses, no cervical lymphadenopathy Cardiovascular: RRR, No MRG Respiratory: CTA B Musculoskeletal: no deformities Skin:no rashes Neurological: no tremor with outstretched hands  ASSESSMENT: 1. DM, non-insulin -dependent, uncontrolled, with complications - CHF - Aortic atherosclerosis - CKD stage III-IV  - No autoimmunity or insulin  deficiency: Component     Latest Ref Rng 01/13/2023  Glucose     65 - 99 mg/dL 823 (H)   Glutamic Acid Decarb Ab     <  5 IU/mL <5   ZNT8 Antibodies     <15 U/mL <10   IA-2 Antibody     <5.4 U/mL <5.4   Insulin  Antibodies, Human     <0.4 U/mL <0.4   C-Peptide     0.80 - 3.85 ng/mL 1.29    2.  Hypothyroidism - No autoimmunity: Component     Latest Ref Rng 01/13/2023  Thyroglobulin Ab     < or = 1 IU/mL <1   Thyroperoxidase Ab SerPl-aCnc     <9 IU/mL 2    3.  Adrenal insufficiency - No autoimmunity: Component     Latest Ref Rng 01/13/2023  21-Hydroxylase Antibodies     NEGATIVE  NEGATIVE    4. HL - has FH of this   PLAN:  1.  Complex patient with longstanding, uncontrolled diabetes, previously off diabetic medications with an HbA1c of 9.4%, increased from 5.0%.  She reported that her blood sugars at home was either at goal or low, down to the 50s, however, in the clinic, her glucose returned to 160.  I advised her to change the test strips but I did not suggest to start medication at that time.  Afterwards, she had increasing HbA1c levels during an absence from the clinic and she even had an undetectably high HbA1c (about 15% in 04/2023 at her visit with PCP).  At that time, PCP started her on Lantus  and NovoLog .  Sugars did improve on this and she started to have low blood sugars.  She was taking NovoLog  inconsistently.  At last visit I advised her to stop NovoLog  based on the blood sugars at home and this significantly improved HbA1c (7.3%) and continue on Lantus  only,  low dose.  I also recommended a CGM.  Daughter mentions that they already had a CGM waiting for them at the pharmacy. CGM interpretation: -At today's visit, we reviewed her CGM downloads: It appears that 26% of values are in target range (goal >70%), while 73% are higher than 180 (goal <25%), and 1% are lower than 70 (goal <4%).  The calculated average blood sugar is 238.  The projected HbA1c for the next 3 months (GMI) is 9.0%. -Reviewing the CGM trends, sugars are much worse in the last 2 weeks compared to a month ago.  Patient and her daughter do not know what could have changed since then.  Daughter is concerned that her mother is forgetting the medications and she does not feel that she would remember to take aspart before every meal.  We discussed about other options for treatment.  I do not feel that oral medications would work for her and she is not a candidate for a GLP-1 receptor agonist due to nausea and weight loss.  Daughter agrees.  A premixed insulin  regimen would be an option but not ideal and not as flexible as the basal-bolus insulin  regimen.  Eventually, patient's daughter agrees for her regimen that allows her to take Lantus  and NovoLog  before breakfast and then again NovoLog  before dinner.  I did advise them to use 5 units of Lantus  in the morning and start adjusting the dose of NovoLog  based on the size of the meal and the sugars before the meals.  I recommended to start with 3 to 4 units of NovoLog  before the 2 meals.  I am not sure if she does not need NovoLog  for lunch, also, but for now, due to the need for a simplified regimen, will continue with the treatment above.- I suggested  to:  Patient Instructions  Please use the following regimen: - Lantus  5 units  before b'fast - Novolog  3-4 units before b'fast and dinner  Please continue hydrocortisone  20 mg daily.  Please continue levothyroxine  150 mcg daily.  Take the thyroid  hormone every day, with water, at least 30 minutes  before breakfast, separated by at least 4 hours from: - acid reflux medications - calcium  - iron  - multivitamins  Regarding hydrocortisone : - You absolutely need to take this medication every day and not skip doses. - Please double the dose if you have a fever, for the duration of the fever. - If you cannot take anything by mouth (vomiting) or you have severe diarrhea so that you eliminate the hydrocortisone  pills in your stool, please make sure that you get the hydrocortisone  in the vein instead - go to the nearest emergency department/urgent care or you may go to your PCPs office  - Please try to get a MedAlert bracelet or pendant indicating: Adrenal insufficiency.   Please return in 3 months.  - advised to check sugars at different times of the day - 4x a day, rotating check times - advised for yearly eye exams >> she is UTD -She has a history of a very high LDL.  She was not on statin due to history of transaminitis but currently on Crestor  10 mg daily. - return to clinic in 3 months  2.  Hypothyroidism - latest thyroid  labs reviewed with pt. >> normal: Lab Results  Component Value Date   TSH 2.940 04/26/2024  - she continues on LT4 150 mcg daily - pt feels good on this dose. - we discussed about taking the thyroid  hormone every day, with water, >30 minutes before breakfast, separated by >4 hours from acid reflux medications, calcium , iron , multivitamins.  At last visit, she was not taking it correctly, only approximately 30 minutes to an hour from calcium , multivitamins, and PPIs.  I advised her to move these later in the day, at least 4 hours later. - will check thyroid  tests today: TSH and fT4  3.  Adrenal insufficiency -Patient with history of primary adrenal insufficiency per review of Dr. Laymond notes.  This was diagnosed in 2012.  She was initially started on hydrocortisone  and she continued to this for 10 years.  She was briefly off the medication after cortisol levels  were normal, but had to restart in 2022.  Reviewing the labs from 2012, she had a low a.m. cortisol, but it stimulated to 15, which, per today's standards is considered normal.  I do not have an ACTH  from 2012, but this was low in 2022.  However, it is unclear whether it was checked off hydrocortisone  at that time.  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 - She was previously taking 30 mg of hydrocortisone  daily. We discussed that this is a high dose for her BMI and we should try to decrease the dose slowly, to the minimum dose that allows her to feel well.  I gave her a protocol to decrease her hydrocortisone  dose to 20 mg daily.  At last visit and again today she continues on this dose.  She is taking the hydrocortisone  dose all in the morning. - She lost 14 pounds before last visit.  She had nausea and headaches before and we discussed about taking the  hydrocortisone  right before meals.  Her nausea improved. - I again discussed with patient and her daughter about sick day rules: - You absolutely need to take this medication every day and not skip doses. - Please double the dose if you have a fever, for the duration of the fever. - If you cannot take anything by mouth (vomiting) or you have severe diarrhea so that you eliminate the hydrocortisone  pills in your stool, please make sure that you get the hydrocortisone  in the vein instead - go to the nearest emergency department/urgent care or you may go to your PCPs office  - Please try to get a MedAlert bracelet or pendant indicating: Adrenal insufficiency.  -We also discussed about trying to stay on the lowest dose of hydrocortisone  that allows her to feel well.  We reviewed the possible side effects of taking too much steroid. - Of note, at last visit we checked her 21-hydroxylase antibodies and  these were negative, ruling out Addison's disease  Lela Fendt, MD PhD [facility] Endocrin[facility]

## 2024-08-09 ENCOUNTER — Ambulatory Visit: Payer: Self-pay | Admitting: Internal Medicine

## 2024-08-14 ENCOUNTER — Other Ambulatory Visit: Payer: Self-pay

## 2024-08-15 ENCOUNTER — Other Ambulatory Visit: Payer: Self-pay

## 2024-08-16 ENCOUNTER — Other Ambulatory Visit: Payer: Self-pay

## 2024-08-17 ENCOUNTER — Other Ambulatory Visit: Payer: Self-pay

## 2024-08-24 ENCOUNTER — Ambulatory Visit: Admitting: Podiatry

## 2024-08-31 DIAGNOSIS — Z419 Encounter for procedure for purposes other than remedying health state, unspecified: Secondary | ICD-10-CM | POA: Diagnosis not present

## 2024-09-07 ENCOUNTER — Other Ambulatory Visit: Payer: Self-pay

## 2024-09-10 ENCOUNTER — Other Ambulatory Visit: Payer: Self-pay

## 2024-09-21 ENCOUNTER — Other Ambulatory Visit: Payer: Self-pay

## 2024-10-08 ENCOUNTER — Emergency Department (HOSPITAL_COMMUNITY)

## 2024-10-08 ENCOUNTER — Other Ambulatory Visit: Payer: Self-pay

## 2024-10-08 ENCOUNTER — Encounter (HOSPITAL_COMMUNITY): Payer: Self-pay | Admitting: Internal Medicine

## 2024-10-08 ENCOUNTER — Inpatient Hospital Stay (HOSPITAL_COMMUNITY)
Admission: EM | Admit: 2024-10-08 | Discharge: 2024-10-11 | DRG: 157 | Disposition: A | Discharge status: Home / Self Care | Attending: Internal Medicine | Admitting: Internal Medicine

## 2024-10-08 DIAGNOSIS — Z91014 Allergy to mammalian meats: Secondary | ICD-10-CM

## 2024-10-08 DIAGNOSIS — Z8249 Family history of ischemic heart disease and other diseases of the circulatory system: Secondary | ICD-10-CM | POA: Diagnosis not present

## 2024-10-08 DIAGNOSIS — E039 Hypothyroidism, unspecified: Secondary | ICD-10-CM | POA: Diagnosis present

## 2024-10-08 DIAGNOSIS — Z833 Family history of diabetes mellitus: Secondary | ICD-10-CM

## 2024-10-08 DIAGNOSIS — E440 Moderate protein-calorie malnutrition: Secondary | ICD-10-CM | POA: Diagnosis present

## 2024-10-08 DIAGNOSIS — G9341 Metabolic encephalopathy: Secondary | ICD-10-CM | POA: Diagnosis present

## 2024-10-08 DIAGNOSIS — N184 Chronic kidney disease, stage 4 (severe): Secondary | ICD-10-CM | POA: Diagnosis present

## 2024-10-08 DIAGNOSIS — I130 Hypertensive heart and chronic kidney disease with heart failure and stage 1 through stage 4 chronic kidney disease, or unspecified chronic kidney disease: Secondary | ICD-10-CM | POA: Diagnosis present

## 2024-10-08 DIAGNOSIS — G9389 Other specified disorders of brain: Secondary | ICD-10-CM | POA: Diagnosis present

## 2024-10-08 DIAGNOSIS — Z83438 Family history of other disorder of lipoprotein metabolism and other lipidemia: Secondary | ICD-10-CM

## 2024-10-08 DIAGNOSIS — E871 Hypo-osmolality and hyponatremia: Secondary | ICD-10-CM | POA: Diagnosis present

## 2024-10-08 DIAGNOSIS — G9340 Encephalopathy, unspecified: Secondary | ICD-10-CM | POA: Diagnosis not present

## 2024-10-08 DIAGNOSIS — F32A Depression, unspecified: Secondary | ICD-10-CM | POA: Diagnosis present

## 2024-10-08 DIAGNOSIS — E1122 Type 2 diabetes mellitus with diabetic chronic kidney disease: Secondary | ICD-10-CM | POA: Diagnosis present

## 2024-10-08 DIAGNOSIS — Z681 Body mass index (BMI) 19 or less, adult: Secondary | ICD-10-CM | POA: Diagnosis not present

## 2024-10-08 DIAGNOSIS — K219 Gastro-esophageal reflux disease without esophagitis: Secondary | ICD-10-CM | POA: Diagnosis present

## 2024-10-08 DIAGNOSIS — I5032 Chronic diastolic (congestive) heart failure: Secondary | ICD-10-CM | POA: Diagnosis present

## 2024-10-08 DIAGNOSIS — R410 Disorientation, unspecified: Secondary | ICD-10-CM

## 2024-10-08 DIAGNOSIS — E271 Primary adrenocortical insufficiency: Secondary | ICD-10-CM | POA: Diagnosis present

## 2024-10-08 DIAGNOSIS — Z885 Allergy status to narcotic agent status: Secondary | ICD-10-CM

## 2024-10-08 DIAGNOSIS — Z794 Long term (current) use of insulin: Secondary | ICD-10-CM | POA: Diagnosis not present

## 2024-10-08 DIAGNOSIS — L03211 Cellulitis of face: Principal | ICD-10-CM | POA: Diagnosis present

## 2024-10-08 DIAGNOSIS — E872 Acidosis, unspecified: Secondary | ICD-10-CM | POA: Diagnosis present

## 2024-10-08 DIAGNOSIS — N179 Acute kidney failure, unspecified: Secondary | ICD-10-CM

## 2024-10-08 DIAGNOSIS — Z79899 Other long term (current) drug therapy: Secondary | ICD-10-CM

## 2024-10-08 DIAGNOSIS — E785 Hyperlipidemia, unspecified: Secondary | ICD-10-CM | POA: Diagnosis present

## 2024-10-08 DIAGNOSIS — K047 Periapical abscess without sinus: Secondary | ICD-10-CM | POA: Diagnosis present

## 2024-10-08 DIAGNOSIS — F419 Anxiety disorder, unspecified: Secondary | ICD-10-CM | POA: Diagnosis present

## 2024-10-08 DIAGNOSIS — Z79890 Hormone replacement therapy (postmenopausal): Secondary | ICD-10-CM | POA: Diagnosis not present

## 2024-10-08 DIAGNOSIS — Z7982 Long term (current) use of aspirin: Secondary | ICD-10-CM | POA: Diagnosis not present

## 2024-10-08 LAB — CBC WITH DIFFERENTIAL/PLATELET
Abs Immature Granulocytes: 0.07 K/uL (ref 0.00–0.07)
Basophils Absolute: 0.1 K/uL (ref 0.0–0.1)
Basophils Relative: 1 %
Eosinophils Absolute: 0.1 K/uL (ref 0.0–0.5)
Eosinophils Relative: 0 %
HCT: 40.5 % (ref 36.0–46.0)
Hemoglobin: 12.6 g/dL (ref 12.0–15.0)
Immature Granulocytes: 1 %
Lymphocytes Relative: 15 %
Lymphs Abs: 2.3 K/uL (ref 0.7–4.0)
MCH: 28.4 pg (ref 26.0–34.0)
MCHC: 31.1 g/dL (ref 30.0–36.0)
MCV: 91.4 fL (ref 80.0–100.0)
Monocytes Absolute: 0.9 K/uL (ref 0.1–1.0)
Monocytes Relative: 6 %
Neutro Abs: 11.7 K/uL — ABNORMAL HIGH (ref 1.7–7.7)
Neutrophils Relative %: 77 %
Platelets: 187 K/uL (ref 150–400)
RBC: 4.43 MIL/uL (ref 3.87–5.11)
RDW: 12.4 % (ref 11.5–15.5)
WBC: 15.1 K/uL — ABNORMAL HIGH (ref 4.0–10.5)
nRBC: 0 % (ref 0.0–0.2)

## 2024-10-08 LAB — COMPREHENSIVE METABOLIC PANEL WITH GFR
ALT: 20 U/L (ref 0–44)
AST: 33 U/L (ref 15–41)
Albumin: 4.2 g/dL (ref 3.5–5.0)
Alkaline Phosphatase: 93 U/L (ref 38–126)
Anion gap: 15 (ref 5–15)
BUN: 35 mg/dL — ABNORMAL HIGH (ref 8–23)
CO2: 22 mmol/L (ref 22–32)
Calcium: 9.5 mg/dL (ref 8.9–10.3)
Chloride: 95 mmol/L — ABNORMAL LOW (ref 98–111)
Creatinine, Ser: 2.19 mg/dL — ABNORMAL HIGH (ref 0.44–1.00)
GFR, Estimated: 24 mL/min — ABNORMAL LOW (ref >60)
Glucose, Bld: 140 mg/dL — ABNORMAL HIGH (ref 70–99)
Potassium: 4.2 mmol/L (ref 3.5–5.1)
Sodium: 132 mmol/L — ABNORMAL LOW (ref 135–145)
Total Bilirubin: 0.9 mg/dL (ref 0.0–1.2)
Total Protein: 8.2 g/dL — ABNORMAL HIGH (ref 6.5–8.1)

## 2024-10-08 LAB — URINALYSIS, W/ REFLEX TO CULTURE (INFECTION SUSPECTED)
Bacteria, UA: NONE SEEN
Bilirubin Urine: NEGATIVE
Glucose, UA: NEGATIVE mg/dL
Ketones, ur: NEGATIVE mg/dL
Leukocytes,Ua: NEGATIVE
Nitrite: NEGATIVE
Protein, ur: NEGATIVE mg/dL
Specific Gravity, Urine: 1.014 (ref 1.005–1.030)
pH: 5 (ref 5.0–8.0)

## 2024-10-08 LAB — URINE DRUG SCREEN
Amphetamines: NEGATIVE
Barbiturates: NEGATIVE
Benzodiazepines: NEGATIVE
Cocaine: NEGATIVE
Fentanyl: NEGATIVE
Methadone Scn, Ur: NEGATIVE
Opiates: NEGATIVE
Tetrahydrocannabinol: NEGATIVE

## 2024-10-08 LAB — CBG MONITORING, ED: Glucose-Capillary: 115 mg/dL — ABNORMAL HIGH (ref 70–99)

## 2024-10-08 LAB — GLUCOSE, CAPILLARY: Glucose-Capillary: 94 mg/dL (ref 70–99)

## 2024-10-08 MED ORDER — FLEET ENEMA RE ENEM: 1.0000 | ENEMA | Freq: Once | RECTAL | Status: DC | PRN

## 2024-10-08 MED ORDER — INSULIN ASPART 100 UNIT/ML IJ SOLN
0.0000 [IU] | Freq: Every day | INTRAMUSCULAR | Status: DC
  Administered 2024-10-09: 3 [IU] via SUBCUTANEOUS

## 2024-10-08 MED ORDER — LACTATED RINGERS IV BOLUS
1000.0000 mL | Freq: Once | INTRAVENOUS | Status: AC
  Administered 2024-10-08: 1000 mL via INTRAVENOUS

## 2024-10-08 MED ORDER — AMLODIPINE BESYLATE 10 MG PO TABS
10.0000 mg | ORAL_TABLET | Freq: Every day | ORAL | Status: DC
  Administered 2024-10-08 – 2024-10-11 (×4): 10 mg via ORAL
  Filled 2024-10-08 (×4): qty 1

## 2024-10-08 MED ORDER — ACETAMINOPHEN 650 MG RE SUPP: 650.0000 mg | Freq: Four times a day (QID) | RECTAL | Status: DC | PRN

## 2024-10-08 MED ORDER — ALBUTEROL SULFATE (2.5 MG/3ML) 0.083% IN NEBU: 2.5000 mg | INHALATION_SOLUTION | RESPIRATORY_TRACT | Status: DC | PRN

## 2024-10-08 MED ORDER — ISOSORBIDE MONONITRATE ER 60 MG PO TB24
120.0000 mg | ORAL_TABLET | Freq: Every day | ORAL | Status: DC
  Administered 2024-10-08 – 2024-10-11 (×4): 120 mg via ORAL
  Filled 2024-10-08 (×4): qty 2

## 2024-10-08 MED ORDER — SERTRALINE HCL 50 MG PO TABS
50.0000 mg | ORAL_TABLET | Freq: Every day | ORAL | Status: DC
  Administered 2024-10-09 – 2024-10-11 (×3): 50 mg via ORAL
  Filled 2024-10-08 (×3): qty 1

## 2024-10-08 MED ORDER — ONDANSETRON HCL 4 MG PO TABS: 4.0000 mg | ORAL_TABLET | Freq: Four times a day (QID) | ORAL | Status: DC | PRN

## 2024-10-08 MED ORDER — TRAMADOL HCL 50 MG PO TABS
50.0000 mg | ORAL_TABLET | Freq: Two times a day (BID) | ORAL | Status: DC | PRN
  Administered 2024-10-10 – 2024-10-11 (×2): 50 mg via ORAL
  Filled 2024-10-08 (×2): qty 1

## 2024-10-08 MED ORDER — ACETAMINOPHEN 325 MG PO TABS
650.0000 mg | ORAL_TABLET | Freq: Four times a day (QID) | ORAL | Status: DC | PRN
Start: 2024-10-08 — End: 2024-10-11

## 2024-10-08 MED ORDER — SODIUM CHLORIDE 0.9 % IV SOLN
3.0000 g | Freq: Two times a day (BID) | INTRAVENOUS | Status: DC
  Administered 2024-10-09 – 2024-10-11 (×5): 3 g via INTRAVENOUS
  Filled 2024-10-08 (×5): qty 8

## 2024-10-08 MED ORDER — LEVOTHYROXINE SODIUM 50 MCG PO TABS
150.0000 ug | ORAL_TABLET | Freq: Every day | ORAL | Status: DC
  Administered 2024-10-09 – 2024-10-11 (×3): 150 ug via ORAL
  Filled 2024-10-08 (×3): qty 1

## 2024-10-08 MED ORDER — PANTOPRAZOLE SODIUM 40 MG PO TBEC
40.0000 mg | DELAYED_RELEASE_TABLET | Freq: Every day | ORAL | Status: DC
  Administered 2024-10-09 – 2024-10-11 (×3): 40 mg via ORAL
  Filled 2024-10-08 (×3): qty 1

## 2024-10-08 MED ORDER — FLUTICASONE PROPIONATE 50 MCG/ACT NA SUSP
1.0000 | Freq: Every day | NASAL | Status: DC
  Administered 2024-10-09 – 2024-10-11 (×3): 1 via NASAL
  Filled 2024-10-08: qty 16

## 2024-10-08 MED ORDER — INSULIN ASPART 100 UNIT/ML IJ SOLN
0.0000 [IU] | Freq: Three times a day (TID) | INTRAMUSCULAR | Status: DC
  Administered 2024-10-09: 5 [IU] via SUBCUTANEOUS
  Administered 2024-10-09: 11 [IU] via SUBCUTANEOUS
  Administered 2024-10-09: 15 [IU] via SUBCUTANEOUS
  Administered 2024-10-10: 3 [IU] via SUBCUTANEOUS
  Administered 2024-10-10: 8 [IU] via SUBCUTANEOUS
  Administered 2024-10-10: 3 [IU] via SUBCUTANEOUS
  Administered 2024-10-11: 2 [IU] via SUBCUTANEOUS

## 2024-10-08 MED ORDER — LINEZOLID 600 MG/300ML IV SOLN
600.0000 mg | Freq: Two times a day (BID) | INTRAVENOUS | Status: DC
  Administered 2024-10-08 – 2024-10-11 (×6): 600 mg via INTRAVENOUS
  Filled 2024-10-08 (×6): qty 300

## 2024-10-08 MED ORDER — SODIUM CHLORIDE 0.9 % IV SOLN
3.0000 g | Freq: Once | INTRAVENOUS | Status: AC
  Administered 2024-10-08: 3 g via INTRAVENOUS
  Filled 2024-10-08: qty 8

## 2024-10-08 MED ORDER — HYDRALAZINE HCL 25 MG PO TABS
25.0000 mg | ORAL_TABLET | Freq: Three times a day (TID) | ORAL | Status: DC
  Administered 2024-10-08 – 2024-10-11 (×5): 25 mg via ORAL
  Filled 2024-10-08 (×7): qty 1

## 2024-10-08 MED ORDER — BISACODYL 10 MG RE SUPP: 10.0000 mg | Freq: Every day | RECTAL | Status: DC | PRN

## 2024-10-08 MED ORDER — INSULIN GLARGINE-YFGN 100 UNIT/ML ~~LOC~~ SOLN
6.0000 [IU] | Freq: Every day | SUBCUTANEOUS | Status: DC
  Administered 2024-10-09 – 2024-10-10 (×2): 6 [IU] via SUBCUTANEOUS
  Filled 2024-10-08 (×4): qty 0.06

## 2024-10-08 MED ORDER — SENNA 8.6 MG PO TABS
1.0000 | ORAL_TABLET | Freq: Two times a day (BID) | ORAL | Status: DC
  Administered 2024-10-08 – 2024-10-11 (×5): 8.6 mg via ORAL
  Filled 2024-10-08 (×6): qty 1

## 2024-10-08 MED ORDER — ADULT MULTIVITAMIN W/MINERALS CH
1.0000 | ORAL_TABLET | Freq: Every day | ORAL | Status: DC
  Administered 2024-10-09 – 2024-10-11 (×3): 1 via ORAL
  Filled 2024-10-08 (×3): qty 1

## 2024-10-08 MED ORDER — POLYETHYLENE GLYCOL 3350 17 G PO PACK: 17.0000 g | PACK | Freq: Every day | ORAL | Status: DC | PRN

## 2024-10-08 MED ORDER — ONDANSETRON HCL 4 MG/2ML IJ SOLN: 4.0000 mg | Freq: Four times a day (QID) | INTRAMUSCULAR | Status: DC | PRN

## 2024-10-08 MED ORDER — FENTANYL CITRATE (PF) 50 MCG/ML IJ SOSY: 12.5000 ug | PREFILLED_SYRINGE | INTRAMUSCULAR | Status: DC | PRN

## 2024-10-08 MED ORDER — SODIUM CHLORIDE 0.9 % IV SOLN: INTRAVENOUS | Status: AC

## 2024-10-08 MED ORDER — VITAMIN B-12 1000 MCG PO TABS
1000.0000 ug | ORAL_TABLET | Freq: Every day | ORAL | Status: DC
  Administered 2024-10-09 – 2024-10-11 (×3): 1000 ug via ORAL
  Filled 2024-10-08 (×3): qty 1

## 2024-10-08 MED ORDER — ASPIRIN 81 MG PO CHEW
81.0000 mg | CHEWABLE_TABLET | Freq: Every day | ORAL | Status: DC
  Administered 2024-10-09 – 2024-10-11 (×3): 81 mg via ORAL
  Filled 2024-10-08 (×3): qty 1

## 2024-10-08 MED ORDER — HYDROCORTISONE 20 MG PO TABS
20.0000 mg | ORAL_TABLET | Freq: Every day | ORAL | Status: DC
  Administered 2024-10-09 – 2024-10-11 (×3): 20 mg via ORAL
  Filled 2024-10-08 (×3): qty 1

## 2024-10-08 MED ORDER — ROSUVASTATIN CALCIUM 10 MG PO TABS
10.0000 mg | ORAL_TABLET | Freq: Every day | ORAL | Status: DC
  Administered 2024-10-09 – 2024-10-11 (×3): 10 mg via ORAL
  Filled 2024-10-08 (×3): qty 1

## 2024-10-08 MED ORDER — HYDROCORTISONE SOD SUC (PF) 100 MG IJ SOLR
100.0000 mg | Freq: Once | INTRAMUSCULAR | Status: AC
  Administered 2024-10-08: 100 mg via INTRAVENOUS
  Filled 2024-10-08: qty 2

## 2024-10-08 MED ORDER — HYDRALAZINE HCL 20 MG/ML IJ SOLN: 10.0000 mg | Freq: Four times a day (QID) | INTRAMUSCULAR | Status: DC | PRN

## 2024-10-08 NOTE — ED Triage Notes (Signed)
 Pt BIB daughter. Has been staying with a family member. Has complained of tooth pain and facial edema for at least 2 days. Today, pt mother called because of significant swelling and confusion. Pt is usually AO4, but today is not oriented to time and unable to get dressed by herself. Hx of DM2, CKD, HTN, hypothyroidism, and CHF. She did not take her daily meds today. Visible edema to right face and upper lip w/ redness around nose.   Current vitals   Temp 98.4,  HR 74 Bp 172/71 SPO2 98 on RA

## 2024-10-08 NOTE — ED Notes (Signed)
 Only 1 set of cultures able to be obtained

## 2024-10-08 NOTE — ED Notes (Signed)
 Cultures have been obtained and ordered after abx

## 2024-10-08 NOTE — H&P (Signed)
 History and Physical    Patient: [name] FMW:[id number] DOB: 27-Sep-1961 DOA: 10/08/2024 DOS: the patient was seen and examined on 10/08/2024 PCP: Vicci Barnie NOVAK, MD  Patient coming from: Home  Chief Complaint: No chief complaint on file.  HPI: [name] is a 63 y.o. female with medical history significant of for but not limited to CKD stage IV, chronic diastolic CHF, primary adrenal insufficiency, diabetes mellitus type 2, essential hypertension, hypothyroidism, cerebral ventriculomegaly and other comorbidities who presents with a toothache and facial swelling and pain for last few days.  Patient's noticed her face started swelling about 2 days ago and is usually normally alert and oriented x 4 but daughter states that she had difficulty getting herself dressed and was more confused.  She has been having ongoing issues with her teeth and had a tooth problem previously and was post go to to see a dentist today but given her confusion and our inability to figure out how to dress herself she was brought to the ED.  She had some pain in her right face and her right upper tooth area and endorses a headache no cervical spine pain.  Also had some pain in her jaw as well but denied any chest pain, nausea vomiting, abdominal pain or difficulty swallowing.  Patient continued to remain confused and given her symptoms the daughter was concerned of her facial swelling and brought her to the ED for further evaluation.  She denied any other concerns or complaints at this time.  [facility] was asked to admit this patient for further evaluation for facial cellulitis.    In the ED she received IV Solu-Cortef , IV fluid hydration, IV antibiotics and basic workup with a CBC, CMP, and imaging done of the head without contrast as well as imaging of the maxillofacial CT scan and a soft tissue neck scan   Review of Systems: As mentioned in the history of present illness. All other systems reviewed and are  negative.  Past Medical History:  Diagnosis Date   Adrenal insufficiency, primary (HCC)    Anemia 2018   Hgb 6.8 early 11/2020.  PRBC x 1.  06/2017 PRBC x 1.     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/2021   Stage 4 as of late May 2022   Diabetes mellitus without complication (HCC)    Hypertension    Hypothyroidism    Past Surgical History:  Procedure Laterality Date   CESAREAN SECTION     IR THORACENTESIS ASP PLEURAL SPACE W/IMG GUIDE  06/05/2021   Social History:  reports that she has never smoked. She has never used smokeless tobacco. She reports that she does not drink alcohol and does not use drugs.  Allergies  Allergen Reactions   Bovine (Beef) Protein-Containing Drug Products     RELIGOUS  REASONS   Porcine (Pork) Protein-Containing Drug Products     RELIGIOUS REASONS   Percocet [Oxycodone -Acetaminophen ] Rash   Family History  Problem Relation Age of Onset   Diabetes Mother    Hyperlipidemia Father    Hypertension Father    Adrenal disorder Neg Hx    Colon polyps Neg Hx    Colon cancer Neg Hx    Esophageal cancer Neg Hx    Rectal cancer Neg Hx    Stomach cancer Neg Hx    Prior to Admission medications   Medication Sig Start Date End  Date Taking? Authorizing Provider  Accu-Chek Softclix Lancets lancets Use as instructed 11/23/21   Vicci Barnie NOVAK, MD  albuterol  (VENTOLIN  HFA) [phone] Base) MCG/ACT inhaler Inhale 1-2 puffs into the lungs every 6 (six) hours as needed for wheezing or shortness of breath. 06/11/24   Vicci Barnie NOVAK, MD  amLODipine  (NORVASC ) 10 MG tablet Take 1 tablet (10 mg total) by mouth daily. 04/26/24 04/26/25  Vicci Barnie NOVAK, MD  aspirin  81 MG chewable tablet Chew 81 mg by mouth daily.    [provider]  Calcium  Carbonate-Vit D-Min (CALCIUM  1200 PO) Take 1,200 mg by mouth daily.    [provider]  Continuous  Glucose Sensor (FREESTYLE LIBRE 3 PLUS SENSOR) MISC Change sensor every 15 days. 04/26/24   Vicci Barnie NOVAK, MD  fluticasone  (FLONASE ) 50 MCG/ACT nasal spray Place 1 spray into both nostrils daily for 1 to 2 weeks then as needed thereafer. 06/11/24   Vicci Barnie NOVAK, MD  Glucagon  (BAQSIMI  TWO PACK) 3 MG/DOSE POWD Place 3 mg into the nose once as needed for up to 1 dose. 06/27/24   Trixie File, MD  hydrALAZINE  (APRESOLINE ) 25 MG tablet Take 1 tablet (25 mg total) by mouth 3 (three) times daily. 04/26/24   Vicci Barnie NOVAK, MD  hydrocortisone  (CORTEF ) 10 MG tablet Take 2-3 tablets (20-30 mg total) by mouth daily. 09/27/23   Trixie File, MD  insulin  aspart (NOVOLOG  FLEXPEN) 100 UNIT/ML FlexPen 2 units subcut before meals.  Hold if BS before meal less than 180 05/13/23   Vicci Barnie NOVAK, MD  insulin  glargine (LANTUS  SOLOSTAR) 100 UNIT/ML Solostar Pen Inject 6 Units into the skin at bedtime. 05/13/23   Vicci Barnie NOVAK, MD  Insulin  Pen Needle (PEN NEEDLES) 31G X 8 MM MISC UAD 05/13/23   Vicci Barnie NOVAK, MD  isosorbide  mononitrate (IMDUR ) 120 MG 24 hr tablet Take 1 tablet (120 mg total) by mouth daily. 05/07/24     levothyroxine  (SYNTHROID ) 150 MCG tablet TAKE 1 TABLET BY MOUTH EVERY DAY BEFORE BREAKFAST 05/28/24   Th[place], [place], MD  Multiple Vitamin (MULTIVITAMIN WITH MINERALS) TABS tablet Take 1 tablet by mouth daily.    [provider]  omeprazole  (PRILOSEC) 20 MG capsule Take 1 capsule (20 mg total) by mouth daily. 04/26/24   Vicci Barnie NOVAK, MD  ondansetron  (ZOFRAN -ODT) 4 MG disintegrating tablet Take 1 tablet (4 mg total) by mouth 2 (two) times daily as needed for nausea or vomiting. 04/26/24   Vicci Barnie NOVAK, MD  rosuvastatin  (CRESTOR ) 10 MG tablet Take 1 tablet (10 mg total) by mouth daily. Stop Zetia  04/28/24   Vicci Barnie NOVAK, MD  sertraline  (ZOLOFT ) 50 MG tablet Take 1 tablet (50 mg total) by mouth daily. 04/26/24   Vicci Barnie NOVAK, MD  torsemide  (DEMADEX ) 20 MG tablet  Take 1 tablet (20 mg total) by mouth daily as needed (for weight gain of 3lbs in 1 day or 5 lbs in 2 days) 04/26/24   Vicci Barnie NOVAK, MD  traMADol  (ULTRAM ) 50 MG tablet Take 1 tablet (50 mg total) by mouth 2 (two) times daily as needed. 04/26/24   Vicci Barnie NOVAK, MD  vitamin B-12 (CYANOCOBALAMIN ) 1000 MCG tablet Take 1 tablet (1,000 mcg total) by mouth daily. 05/16/21   Raenelle Donalda HERO, MD   Physical Exam: Vitals:   10/08/24 1842 10/08/24 1855 10/08/24 2058 10/08/24 2200  BP: (!) 171/61 (!) 116/96 (!) 150/50 (!) 153/60  Pulse:  67  72  Resp:    18  Temp:  99 F (37.2 C)  (!) 97.4 F (36.3 C)  TempSrc:  Oral  Oral  SpO2:  98%  100%  Weight:      Height:       Examination: Physical Exam:  Constitutional: Thin female in no acute distress appears confused Respiratory: Diminished to auscultation bilaterally, no wheezing, rales, rhonchi or crackles. Normal respiratory effort and patient is not tachypenic. No accessory muscle use.  Unlabored breathing Cardiovascular: RRR, no murmurs / rubs / gallops. S1 and S2 auscultated. No extremity edema.  Abdomen: Soft, non-tender, non-distended. Bowel sounds positive.  GU: Deferred. Musculoskeletal: No clubbing / cyanosis of digits/nails. No joint deformity upper and lower extremities. Skin: + Facial swelling and tenderness noted as well as some mild erythema of especially around the right orbit Neurologic: CN 2-12 grossly intact with no focal deficits. Romberg sign and cerebellar reflexes not assessed.  Psychiatric: Impaired judgment and insight. Alert and oriented x 1  Data Reviewed: Recent Results (from the past 2160 hours)  TSH     Status: Abnormal   Collection Time: 08/08/24 11:07 AM  Result Value Ref Range   TSH 7.07 (H) 0.40 - 4.50 mIU/L  T4, free     Status: None   Collection Time: 08/08/24 11:07 AM  Result Value Ref Range   Free T4 1.0 0.8 - 1.8 ng/dL  POC CBG, ED     Status: Abnormal   Collection Time: 10/08/24 10:01 AM   Result Value Ref Range   Glucose-Capillary 115 (H) 70 - 99 mg/dL    Comment: Glucose reference range applies only to samples taken after fasting for at least 8 hours.   Comment 1 Notify RN   CBC with Differential     Status: Abnormal   Collection Time: 10/08/24 10:38 AM  Result Value Ref Range   WBC 15.1 (H) 4.0 - 10.5 K/uL   RBC 4.43 3.87 - 5.11 MIL/uL   Hemoglobin 12.6 12.0 - 15.0 g/dL   HCT 59.4 63.9 - 53.9 %   MCV 91.4 80.0 - 100.0 fL   MCH 28.4 26.0 - 34.0 pg   MCHC 31.1 30.0 - 36.0 g/dL   RDW 87.5 88.4 - 84.4 %   Platelets 187 150 - 400 K/uL   nRBC 0.0 0.0 - 0.2 %   Neutrophils Relative % 77 %   Neutro Abs 11.7 (H) 1.7 - 7.7 K/uL   Lymphocytes Relative 15 %   Lymphs Abs 2.3 0.7 - 4.0 K/uL   Monocytes Relative 6 %   Monocytes Absolute 0.9 0.1 - 1.0 K/uL   Eosinophils Relative 0 %   Eosinophils Absolute 0.1 0.0 - 0.5 K/uL   Basophils Relative 1 %   Basophils Absolute 0.1 0.0 - 0.1 K/uL   Immature Granulocytes 1 %   Abs Immature Granulocytes 0.07 0.00 - 0.07 K/uL    Comment: Performed at [facility], [address] [place], [place] [address]  Comprehensive metabolic panel     Status: Abnormal   Collection Time: 10/08/24 10:38 AM  Result Value Ref Range   Sodium 132 (L) 135 - 145 mmol/L   Potassium 4.2 3.5 - 5.1 mmol/L   Chloride 95 (L) 98 - 111 mmol/L   CO2 22 22 - 32 mmol/L   Glucose, Bld 140 (H) 70 - 99 mg/dL    Comment: Glucose reference range applies only to samples taken after fasting for at least 8 hours.   BUN 35 (H) 8 - 23 mg/dL   Creatinine,  Ser 2.19 (H) 0.44 - 1.00 mg/dL   Calcium  9.5 8.9 - 10.3 mg/dL   Total Protein 8.2 (H) 6.5 - 8.1 g/dL   Albumin  4.2 3.5 - 5.0 g/dL   AST 33 15 - 41 U/L   ALT 20 0 - 44 U/L   Alkaline Phosphatase 93 38 - 126 U/L   Total Bilirubin 0.9 0.0 - 1.2 mg/dL   GFR, Estimated 24 (L) >60 mL/min    Comment: (NOTE) Calculated using the CKD-EPI Creatinine Equation (2021)    Anion gap 15 5 - 15    Comment:  Performed at [facility], [address] [place], [place] [address]  Urinalysis, w/ Reflex to Culture (Infection Suspected) -Urine, Clean Catch     Status: Abnormal   Collection Time: 10/08/24 12:45 PM  Result Value Ref Range   Specimen Source URINE, CLEAN CATCH    Color, Urine YELLOW YELLOW   APPearance CLEAR CLEAR   Specific Gravity, Urine 1.014 1.005 - 1.030   pH 5.0 5.0 - 8.0   Glucose, UA NEGATIVE NEGATIVE mg/dL   Hgb urine dipstick SMALL (A) NEGATIVE   Bilirubin Urine NEGATIVE NEGATIVE   Ketones, ur NEGATIVE NEGATIVE mg/dL   Protein, ur NEGATIVE NEGATIVE mg/dL   Nitrite NEGATIVE NEGATIVE   Leukocytes,Ua NEGATIVE NEGATIVE   RBC / HPF 0-5 0 - 5 RBC/hpf   WBC, UA 0-5 0 - 5 WBC/hpf    Comment:        Reflex urine culture not performed if WBC <=10, OR if Squamous epithelial cells >5. If Squamous epithelial cells >5 suggest recollection.    Bacteria, UA NONE SEEN NONE SEEN   Squamous Epithelial / HPF 0-5 0 - 5 /HPF   Mucus PRESENT    Hyaline Casts, UA PRESENT     Comment: Performed at [facility], [address] [place], [place] [address]  Glucose, capillary     Status: None   Collection Time: 10/08/24 10:01 PM  Result Value Ref Range   Glucose-Capillary 94 70 - 99 mg/dL    Comment: Glucose reference range applies only to samples taken after fasting for at least 8 hours.   EKG: Showed a sinus rhythm at a rate of 75 with a QTc of 444 ms and LVH and nonspecific T wave changes but no evidence of ST elevation or depression on my interpretation  Assessment and Plan: No notes have been filed under this hospital service. Service: Hospitalist  Facial Cellulitis: Likely an Odontogenic Origin. Check Blood Cx x2. Was not Septic on admission but had a Leukocytosis (WBC of 15.1). Admit to Inpt Progressive. Start IV Unasyn and IV Linezolid . Check Blood Cx x2. Maxillofacial CT done and showed Facial edema and inflammatory stranding predominantly at  midline and on the right. Right periorbital swelling also present. Findings are compatible with cellulitis in the appropriate clinical setting. Consider an odontogenic infection given the provided history of tooth pain and given the multifocal periodontal disease described within the body of the report. No appreciable soft tissue abscess on this non-contrast examination. Mucosal thickening within the paranasal sinuses, as described. -CT Soft Tissue Neck w/o Contrast done and showed Facial cellulitis.  No definite abscess identified. -Pain control Acetaminophen  650 mg p.o./RC every 6 as needed for mild pain no fever, Tramadol  50 mg p.o. twice daily as needed for moderate pain and Fentanyl  12.5 mg IV every 2 as needed severe pain -If not improving may need ID involvement and Surgical Evaluation  Confusion and Acute  Encephalopathy in the setting of above: Head Imaging done and  showed No evidence of an acute intracranial abnormality but did show  Prominence of the lateral and third ventricles which is similar to the prior MRI of 05/13/2021. -Per the Radiologist these findings could reflect central predominant atrophy. However, there are also imaging findings which can be seen in setting of normal pressure hydrocephalus and NPH is possible in the appropriate clinical setting. -Patient does have a Documented Hx of of Cerebral Ventriculomegaly -Checked U/A and Negative; Check UDS  Hypothyroidism: Check TSH in the AM; Resume Levothyroxine  150 mg po Daily  Primary Adrenal Insufficiency: S/p IV Solu-Cortef  100 mg x1 in the ED; Resume Home Hydrocortisone  20-30 mg po Daily   Hyponatremia: Na+ is now 132. C/w IVF Hydration as above and CTM and Trend Daily BMP  CKD Stage IV: BUN/Cr Trend: Recent Labs  Lab 10/08/24 1038  BUN 35*  CREATININE 2.19*  -Getting IVF w/ NS @ 75 mL/hr -Avoid Nephrotoxic Medications, Contrast Dyes, Hypotension and Dehydration to Ensure Adequate Renal Perfusion and will need to  Renally Adjust Meds. CTM and Trend Renal Function carefully and repeat CMP in the AM   DMII: Semglee 6 units. Add Moderate Novolog  SSI AC; Last HbA1c was 7.3 and will repeat. Trend CBGs; Current CBGs ranging from 94-115. Heart Healthy/Carb Modified Diet;   Chronic Diastolic CHF: C/w Hydralazine  25 mg po TID, and Isosorbide  Mononitrate 120 mg po Daily. CTM for S/Sx of Volume Overload as patient is beign hydrated as above.   Essential HTN: C/w Amlodipine  10 mg po Daily, Hydralazine  25 mg po TID, and Isosorbide  Mononitrate 120 mg po Daily. CTM BP per Protocol. Added IV Hydralazine  10 mg q6hprn for SBP>180 or DBP>100. Last BP reading was 153/60.  HLD: C/w Rosuvastatin  50 mg po Daily  Depression and Anxiety: C/w Sertraline  50 mg po Daily  Underweight: Estimated body mass index is 18.01 kg/m as calculated from the following:   Height as of this encounter: 5' 4 (1.626 m).   Weight as of this encounter: 47.6 kg. Nutrition Consult  GERD/GI Prophylaxis: C/w PPI Substitution of Omeprazole  20 mg po Daily w/ Pantoprazole  40 mg po Daily  VTE Prophylaxis: For religious purposes the patient does not take any pork based products; continue with SCDs and early ambulation and consider low-dose DOAC  Advance Care Planning:   Code Status: Full Code   Consults: None  Family Communication: D/w Daughters @ bedside  Severity of Illness: The appropriate patient status for this patient is INPATIENT. Inpatient status is judged to be reasonable and necessary in order to provide the required intensity of service to ensure the patient's safety. The patient's presenting symptoms, physical exam findings, and initial radiographic and laboratory data in the context of their chronic comorbidities is felt to place them at high risk for further clinical deterioration. Furthermore, it is not anticipated that the patient will be medically stable for discharge from the hospital within 2 midnights of admission.   * I certify  that at the point of admission it is my clinical judgment that the patient will require inpatient hospital care spanning beyond 2 midnights from the point of admission due to high intensity of service, high risk for further deterioration and high frequency of surveillance required.*  Author: Alejandro Marker, DO [facility] 10/08/2024 10:17 PM  For on call review [email or web address].

## 2024-10-08 NOTE — ED Triage Notes (Signed)
 Pt reports ongoing tooth problem with pain.

## 2024-10-08 NOTE — ED Provider Notes (Signed)
 [name] EMERGENCY DEPARTMENT AT [facility] Provider Note   CSN: [id number] Arrival date & time: 10/08/24  9048     History {Add pertinent medical, surgical, social history, OB history to HPI:1} No chief complaint on file.   [name] is a 63 y.o. female with PMH as listed below who presents with facial swelling and confusion. Patient daughter provides additional history. Patient had been complaining of R upper tooth pain for a couple of days. Daughter went to her house this AM to take her to the urgent dentist, but when patient was getting ready to go, daughter noticed significant facial swelling and some confusion - she was having a hard time dressing herself and couldn't figure out how to dress herself. This is very abnormal for her. Patient's daughter states she is prone to falls but patient denies any fall or trauma. Endorses pain in her right face and right upper tooth area. She also endorses headache but no c-spine pain. Endorses some pain under her jaw as well. No f/c reported, CP, nausea/vomiting, abdominal pain.    Past Medical History:  Diagnosis Date   Adrenal insufficiency, primary (HCC)    Anemia 2018   Hgb 6.8 early 11/2020.  PRBC x 1.  06/2017 PRBC x 1.     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/2021   Stage 4 as of late May 2022   Diabetes mellitus without complication (HCC)    Hypertension    Hypothyroidism        Home Medications Prior to Admission medications   Medication Sig Start Date End Date Taking? Authorizing Provider  Accu-Chek Softclix Lancets lancets Use as instructed 11/23/21   Vicci Barnie NOVAK, MD  albuterol  (VENTOLIN  HFA) 108 [phone] Base) MCG/ACT inhaler Inhale 1-2 puffs into the lungs every 6 (six) hours as needed for wheezing or shortness of breath. 06/11/24   Vicci Barnie NOVAK, MD  amLODipine  (NORVASC ) 10 MG tablet  Take 1 tablet (10 mg total) by mouth daily. 04/26/24 04/26/25  Vicci Barnie NOVAK, MD  aspirin  81 MG chewable tablet Chew 81 mg by mouth daily.    [provider]  Calcium  Carbonate-Vit D-Min (CALCIUM  1200 PO) Take 1,200 mg by mouth daily.    [provider]  Continuous Glucose Sensor (FREESTYLE LIBRE 3 PLUS SENSOR) MISC Change sensor every 15 days. 04/26/24   Vicci Barnie NOVAK, MD  fluticasone  (FLONASE ) 50 MCG/ACT nasal spray Place 1 spray into both nostrils daily for 1 to 2 weeks then as needed thereafer. 06/11/24   Vicci Barnie NOVAK, MD  Glucagon  (BAQSIMI  TWO PACK) 3 MG/DOSE POWD Place 3 mg into the nose once as needed for up to 1 dose. 06/27/24   Trixie File, MD  hydrALAZINE  (APRESOLINE ) 25 MG tablet Take 1 tablet (25 mg total) by mouth 3 (three) times daily. 04/26/24   Vicci Barnie NOVAK, MD  hydrocortisone  (CORTEF ) 10 MG tablet Take 2-3 tablets (20-30 mg total) by mouth daily. 09/27/23   Trixie File, MD  insulin  aspart (NOVOLOG  FLEXPEN) 100 UNIT/ML FlexPen 2 units subcut before meals.  Hold if BS before meal less than 180 05/13/23   Vicci Barnie NOVAK, MD  insulin  glargine (LANTUS  SOLOSTAR) 100 UNIT/ML Solostar Pen Inject 6 Units into the skin at bedtime. 05/13/23   Vicci Barnie NOVAK, MD  Insulin  Pen Needle (PEN NEEDLES) 31G X 8 MM MISC UAD 05/13/23  Vicci Barnie NOVAK, MD  isosorbide  mononitrate (IMDUR ) 120 MG 24 hr tablet Take 1 tablet (120 mg total) by mouth daily. 05/07/24     levothyroxine  (SYNTHROID ) 150 MCG tablet TAKE 1 TABLET BY MOUTH EVERY DAY BEFORE BREAKFAST 05/28/24   Thapa, [place], MD  Multiple Vitamin (MULTIVITAMIN WITH MINERALS) TABS tablet Take 1 tablet by mouth daily.    [provider]  omeprazole  (PRILOSEC) 20 MG capsule Take 1 capsule (20 mg total) by mouth daily. 04/26/24   Vicci Barnie NOVAK, MD  ondansetron  (ZOFRAN -ODT) 4 MG disintegrating tablet Take 1 tablet (4 mg total) by mouth 2 (two) times daily as needed for nausea or vomiting. 04/26/24    Vicci Barnie NOVAK, MD  rosuvastatin  (CRESTOR ) 10 MG tablet Take 1 tablet (10 mg total) by mouth daily. Stop Zetia  04/28/24   Vicci Barnie NOVAK, MD  sertraline  (ZOLOFT ) 50 MG tablet Take 1 tablet (50 mg total) by mouth daily. 04/26/24   Vicci Barnie NOVAK, MD  torsemide  (DEMADEX ) 20 MG tablet Take 1 tablet (20 mg total) by mouth daily as needed (for weight gain of 3lbs in 1 day or 5 lbs in 2 days) 04/26/24   Vicci Barnie NOVAK, MD  traMADol  (ULTRAM ) 50 MG tablet Take 1 tablet (50 mg total) by mouth 2 (two) times daily as needed. 04/26/24   Vicci Barnie NOVAK, MD  vitamin B-12 (CYANOCOBALAMIN ) 1000 MCG tablet Take 1 tablet (1,000 mcg total) by mouth daily. 05/16/21   Ghimire, Donalda HERO, MD      Allergies    Bovine (beef) protein-containing drug products, Porcine (pork) protein-containing drug products, and Percocet [oxycodone -acetaminophen ]    Review of Systems   Review of Systems A 10 point review of systems was performed and is negative unless otherwise reported in HPI.  Physical Exam Updated Vital Signs BP (!) 172/71   Pulse 74   Temp 98.4 F (36.9 C) (Oral)   Resp 16   Ht 5' 4 (1.626 m)   Wt 47.6 kg   SpO2 98%   BMI 18.01 kg/m  Physical Exam General: Normal appearing female, lying in bed.  HEENT: PERRLA, Sclera anicteric, MMM, trachea midline.  Significant swelling noted to the right cheek and maxillary area with mild erythema/induration and tenderness to palpation.  Extends up into the inferior orbital area as well and into the upper lip.  She has tenderness to palpation around tooth number 5 with no significant intraoral abnormalities noted. Normal appearing posterior oropharynx , with symmetric tonsillar pillars and normal-appearing uvula.  Cardiology: RRR, no murmurs/rubs/gallops.  Resp: Normal respiratory rate and effort. CTAB, no wheezes, rhonchi, crackles.  Abd: Soft, non-tender, non-distended. No rebound tenderness or guarding.  GU: Deferred. MSK: No peripheral edema or signs  of trauma. Extremities without deformity or TTP. No cyanosis or clubbing. Skin: warm, dry Back: No CVA tenderness Neuro: A&Ox2-3 (oriented to self, place, and situation but not year or president), CNs II-XII grossly intact. 5/5 strength in all extremities. Sensation grossly intact.   ED Results / Procedures / Treatments   Labs (all labs ordered are listed, but only abnormal results are displayed) Labs Reviewed  CBG MONITORING, ED - Abnormal; Notable for the following components:      Result Value   Glucose-Capillary 115 (*)    All other components within normal limits  CBC WITH DIFFERENTIAL/PLATELET  COMPREHENSIVE METABOLIC PANEL WITH GFR  URINALYSIS, W/ REFLEX TO CULTURE (INFECTION SUSPECTED)    EKG EKG Interpretation Date/Time:  Monday October 08 2024 10:04:57 EDT Ventricular Rate:  75 PR Interval:  165 QRS Duration:  102 QT Interval:  397 QTC Calculation: 444 R Axis:   -19  Text Interpretation: Sinus rhythm Probable left ventricular hypertrophy Nonspecific T abnormalities, lateral leads Similar T wave changes to prior Confirmed by Franklyn Gills [phone]) on 10/08/2024 10:08:37 AM  Radiology No results found.  Procedures Procedures  {Document cardiac monitor, telemetry assessment procedure when appropriate:1}  Medications Ordered in ED Medications - No data to display  ED Course/ Medical Decision Making/ A&P                          Medical Decision Making Amount and/or Complexity of Data Reviewed Labs: ordered. Decision-making details documented in ED Course. Radiology: ordered.    This patient presents to the ED for concern of facial swelling and confusion, this involves an extensive number of treatment options, and is a complaint that carries with it a high risk of complications and morbidity.  I considered the following differential and admission for this acute, potentially life threatening condition.   MDM:    Ddx of acute altered mental status or  encephalopathy considered but not limited to: -Infection such as deep space facial infection, orbital cellulitis.she does report some pain with extraocular movements.  Will obtain CT max face with contrast for further evaluation.  She does have some mild tenderness to palpation under the jaw but no significant neck swelling noted, lower suspicion for Ludwig's angina but will obtain a CT soft tissue neck as well.  Will obtain CT head as well to rule out ICH given her reported headache that patient's daughter reported she is at increased risk for falls but patient is NCAT and reports no fall.  Also considered but less likely given the clinical context UTI, PNA.  The likely causes tooth pain as patient had reported tooth pain over the last couple of days.  Lower concern for angioedema as a cause.   - No concern for anaphylaxis with no hives or rash, shortness of breath or wheezing, nausea vomiting - No significant electrolyte abnormalities or hyper/hypoglycemia - No chest pain or new abnormalities on EKG, doubt ACS or arrhythmia    Clinical Course as of 10/08/24 1034  Mon Oct 08, 2024  1008 Glucose-Capillary(!): 115 [HN]    Clinical Course User Index [HN] Franklyn Gills SAILOR, MD    Labs: I Ordered, and personally interpreted labs.  The pertinent results include:  those listed above  Imaging Studies ordered: I ordered imaging studies including CTH, CT max face and soft tissue neck w/ contrast I independently visualized and interpreted imaging. I agree with the radiologist interpretation  Additional history obtained from chart review, daughter at bedside.   Cardiac Monitoring: The patient was maintained on a cardiac monitor.  I personally viewed and interpreted the cardiac monitored which showed an underlying rhythm of: NSr  Reevaluation: After the interventions noted above, I reevaluated the patient and found that they have :improved  Social Determinants of Health: Lives  independently  Disposition:  ***  Co morbidities that complicate the patient evaluation  Past Medical History:  Diagnosis Date   Adrenal insufficiency, primary (HCC)    Anemia 2018   Hgb 6.8 early 11/2020.  PRBC x 1.  06/2017 PRBC x 1.     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/2021   Stage 4 as of  late May 2022   Diabetes mellitus without complication (HCC)    Hypertension    Hypothyroidism      Medicines No orders of the defined types were placed in this encounter.   I have reviewed the patients home medicines and have made adjustments as needed  Problem List / ED Course: Problem List Items Addressed This Visit   None        {Document critical care time when appropriate:1} {Document review of labs and clinical decision tools ie heart score, Chads2Vasc2 etc:1}  {Document your independent review of radiology images, and any outside records:1} {Document your discussion with family members, caretakers, and with consultants:1} {Document social determinants of health affecting pt's care:1} {Document your decision making why or why not admission, treatments were needed:1}  This note was created using dictation software, which may contain spelling or grammatical errors.

## 2024-10-08 NOTE — ED Notes (Signed)
 Pt states she is unable to urinate at this time.

## 2024-10-09 ENCOUNTER — Other Ambulatory Visit (HOSPITAL_COMMUNITY): Payer: Self-pay

## 2024-10-09 DIAGNOSIS — E271 Primary adrenocortical insufficiency: Secondary | ICD-10-CM | POA: Diagnosis not present

## 2024-10-09 DIAGNOSIS — N184 Chronic kidney disease, stage 4 (severe): Secondary | ICD-10-CM

## 2024-10-09 DIAGNOSIS — L03211 Cellulitis of face: Secondary | ICD-10-CM | POA: Diagnosis not present

## 2024-10-09 LAB — CBC
HCT: 30.2 % — ABNORMAL LOW (ref 36.0–46.0)
Hemoglobin: 9.6 g/dL — ABNORMAL LOW (ref 12.0–15.0)
MCH: 27.9 pg (ref 26.0–34.0)
MCHC: 31.8 g/dL (ref 30.0–36.0)
MCV: 87.8 fL (ref 80.0–100.0)
Platelets: 160 K/uL (ref 150–400)
RBC: 3.44 MIL/uL — ABNORMAL LOW (ref 3.87–5.11)
RDW: 12.3 % (ref 11.5–15.5)
WBC: 7.9 K/uL (ref 4.0–10.5)
nRBC: 0 % (ref 0.0–0.2)

## 2024-10-09 LAB — GLUCOSE, CAPILLARY
Glucose-Capillary: 225 mg/dL — ABNORMAL HIGH (ref 70–99)
Glucose-Capillary: 338 mg/dL — ABNORMAL HIGH (ref 70–99)
Glucose-Capillary: 360 mg/dL — ABNORMAL HIGH (ref 70–99)
Glucose-Capillary: 354 mg/dL — ABNORMAL HIGH (ref 70–99)
Glucose-Capillary: 278 mg/dL — ABNORMAL HIGH (ref 70–99)

## 2024-10-09 LAB — COMPREHENSIVE METABOLIC PANEL WITH GFR
ALT: 15 U/L (ref 0–44)
AST: 25 U/L (ref 15–41)
Albumin: 3.6 g/dL (ref 3.5–5.0)
Alkaline Phosphatase: 84 U/L (ref 38–126)
Anion gap: 14 (ref 5–15)
BUN: 33 mg/dL — ABNORMAL HIGH (ref 8–23)
CO2: 21 mmol/L — ABNORMAL LOW (ref 22–32)
Calcium: 8.7 mg/dL — ABNORMAL LOW (ref 8.9–10.3)
Chloride: 97 mmol/L — ABNORMAL LOW (ref 98–111)
Creatinine, Ser: 1.67 mg/dL — ABNORMAL HIGH (ref 0.44–1.00)
GFR, Estimated: 34 mL/min — ABNORMAL LOW (ref >60)
Glucose, Bld: 231 mg/dL — ABNORMAL HIGH (ref 70–99)
Potassium: 4.3 mmol/L (ref 3.5–5.1)
Sodium: 131 mmol/L — ABNORMAL LOW (ref 135–145)
Total Bilirubin: 0.7 mg/dL (ref 0.0–1.2)
Total Protein: 6.6 g/dL (ref 6.5–8.1)

## 2024-10-09 LAB — TSH: TSH: 1.83 u[IU]/mL (ref 0.350–4.500)

## 2024-10-09 LAB — PHOSPHORUS: Phosphorus: 4.1 mg/dL (ref 2.5–4.6)

## 2024-10-09 LAB — HEMOGLOBIN A1C
Hgb A1c MFr Bld: 8.8 % — ABNORMAL HIGH (ref 4.8–5.6)
Mean Plasma Glucose: 205.86 mg/dL

## 2024-10-09 LAB — MAGNESIUM: Magnesium: 1.9 mg/dL (ref 1.7–2.4)

## 2024-10-09 LAB — HIV ANTIBODY (ROUTINE TESTING W REFLEX): HIV Screen 4th Generation wRfx: NONREACTIVE

## 2024-10-09 MED ORDER — ENSURE PLUS HIGH PROTEIN PO LIQD
237.0000 mL | Freq: Two times a day (BID) | ORAL | Status: DC
  Administered 2024-10-09: 237 mL via ORAL

## 2024-10-09 NOTE — Progress Notes (Signed)
 Initial Nutrition Assessment  DOCUMENTATION CODES:   Non-severe (moderate) malnutrition in context of chronic illness  INTERVENTION:  - Liberalize diet to Carb Modified to avoid restricting intake given malnutrition and underweight status - Ensure Plus High Protein po BID, each supplement provides 350 kcal and 20 grams of protein. - Encourage intake at all meals and of supplements.  - Monitor weight trends.  NUTRITION DIAGNOSIS:   Moderate Malnutrition related to chronic illness (CKD 4, CHF) as evidenced by mild fat depletion, moderate muscle depletion.  GOAL:   Patient will meet greater than or equal to 90% of their needs  MONITOR:   PO intake, Supplement acceptance, Weight trends  REASON FOR ASSESSMENT:   Consult Assessment of nutrition requirement/status  ASSESSMENT:   63 y.o. female with PMH significant for CKD stage IV, chronic diastolic CHF, primary adrenal insufficiency, DM2, essential hypertension, hypothyroidism, cerebral ventriculomegaly and other comorbidities who presented with a toothache and facial swelling and pain for last few days. Admitted for facial cellulitis and acute encephalopathy.   Patient reports a UBW of  115# and weight  loss recently but unsure of exact time frame or a reason for her weight loss.  Per EMR, Patient weighed at 114# in October 2024 and dropped to 102# by June 2025. This is a 12# or 10% weight loss in 8 months, which is not significant for the time frame. Weight has been stable/up since that time.  Patient admits she has never been a big eater. Usually only eats maybe 2 meals a day. Over the past few weeks to months she has been eating 2 meals but notes they have been smaller portions. Reports appetite has been normal and she is not sure why she has been eating less.   Normal appetite continues into admission and patient reports she is trying to eat. Noted to be admitted with facial cellulitis but patient reports the swelling has gone  down and has not bothered her when chewing.  No meal intakes documented since admission. Stressed importance of ordering and eating 3 meals a day to support meeting nutrition needs and help prevent weight loss. Patient is agreeable to try Ensure as well. RD provided one during visit.    Medications reviewed and include: 1000mcg vitamin B12, MVI, Senokot  Labs reviewed:  Na 131 Creatinine 1.67 HA1C 8.8 Blood Glucose 94-225 x24 hours   NUTRITION - FOCUSED PHYSICAL EXAM:  Flowsheet Row Most Recent Value  Orbital Region Mild depletion  Upper Arm Region Mild depletion  Thoracic and Lumbar Region Unable to assess  Buccal Region No depletion  Temple Region Mild depletion  Clavicle Bone Region Moderate depletion  Clavicle and Acromion Bone Region Moderate depletion  Scapular Bone Region Unable to assess  Dorsal Hand No depletion  Patellar Region Mild depletion  Anterior Thigh Region Mild depletion  Posterior Calf Region Moderate depletion  Edema (RD Assessment) None  Hair Reviewed  Eyes Reviewed  Mouth Reviewed  Skin Reviewed  Nails Reviewed    Diet Order:   Diet Order             Diet heart healthy/carb modified Room service appropriate? Yes; Fluid consistency: Thin  Diet effective now                   EDUCATION NEEDS:  Education needs have been addressed  Skin:  Skin Assessment: Reviewed RN Assessment  Last BM:  10/19  Height:  Ht Readings from Last 1 Encounters:  10/08/24 5' 4 (1.626 m)  Weight:  Wt Readings from Last 1 Encounters:  10/08/24 47.6 kg   Ideal Body Weight:  54.55 kg  BMI:  Body mass index is 18.01 kg/m.  Estimated Nutritional Needs:  Kcal:  1750-1900 kcals Protein:  70-85 grams Fluid:  >/= 1.8L    Trude Ned RD, LDN Contact via Secure Chat.

## 2024-10-09 NOTE — Hospital Course (Addendum)
 [name] is a 63 y.o. female with medical history significant of for but not limited to CKD stage IV, chronic diastolic CHF, primary adrenal insufficiency, diabetes mellitus type 2, essential hypertension, hypothyroidism, cerebral ventriculomegaly and other comorbidities who presents with a toothache and facial swelling and pain for last few days.  Patient's noticed her face started swelling about 2 days ago and is usually normally alert and oriented x 4 but daughter states that she had difficulty getting herself dressed and was more confused.  She has been having ongoing issues with her teeth and had a tooth problem previously and was post go to to see a dentist today but given her confusion and our inability to figure out how to dress herself she was brought to the ED.  She had some pain in her right face and her right upper tooth area and endorses a headache no cervical spine pain.  Also had some pain in her jaw as well but denied any chest pain, nausea vomiting, abdominal pain or difficulty swallowing.  Patient continued to remain confused and given her symptoms the daughter was concerned of her facial swelling and brought her to the ED for further evaluation.  She denied any other concerns or complaints at this time.  [facility] was asked to admit this patient for further evaluation for facial cellulitis.     In the ED she received IV Solu-Cortef , IV fluid hydration, IV antibiotics and basic workup with a CBC, CMP, and imaging done of the head without contrast as well as imaging of the maxillofacial CT scan and a soft tissue neck scan   **Interim History Slowly improving and face is less swollen. Mentation is more appropriate but still mildly confused.  Assessment and Plan:  Facial Cellulitis: Likely an Odontogenic Origin. Check Blood Cx x2. Was not Septic on admission but had a Leukocytosis (WBC of 15.1). Now WBC is 7.9 Admitted to Inpt Progressive. C/w IV Unasyn and IV Linezolid . Check Blood Cx x2  <12 hours -Maxillofacial CT done and showed Facial edema and inflammatory stranding predominantly at midline and on the right. Right periorbital swelling also present. Findings are compatible with cellulitis in the appropriate clinical setting. Consider an odontogenic infection given the provided history of tooth pain and given the multifocal periodontal disease described within the body of the report. No appreciable soft tissue abscess on this non-contrast examination. Mucosal thickening within the paranasal sinuses, as described. -CT Soft Tissue Neck w/o Contrast done and showed Facial cellulitis.  No definite abscess identified. -Pain control Acetaminophen  650 mg p.o./RC every 6 as needed for mild pain no fever, Tramadol  50 mg p.o. twice daily as needed for moderate pain and Fentanyl  12.5 mg IV every 2 as needed severe pain -Slowly improving however may need ID involvement and Surgical Evaluation if acutely worsens or fails to improve further    Confusion and Acute Encephalopathy in the setting of above: Improving. Head Imaging done and showed No evidence of an acute intracranial abnormality but did show  Prominence of the lateral and third ventricles which is similar to the prior MRI of 05/13/2021. -Per the Radiologist these findings could reflect central predominant atrophy. However, there are also imaging findings which can be seen in setting of normal pressure hydrocephalus and NPH is possible in the appropriate clinical setting. -Patient does have a Documented Hx of of Cerebral Ventriculomegaly -Checked U/A and Negative; Check UDS and Negative -Will need PT/OT to evaluate and Treat   Hypothyroidism: TSH is 1.830; Resume Levothyroxine  150  mg po Daily   Primary Adrenal Insufficiency: S/p IV Solu-Cortef  100 mg x1 in the ED; Resume Home Hydrocortisone  20-30 mg po Daily    Hyponatremia: Na+ is now 131. C/w IVF Hydration as below and CTM and Trend Daily BMP  CKD Stage IV / Metabolic Acidosis:  -BUN/Cr Trend: Recent Labs  Lab 10/08/24 1038 10/09/24 0345  BUN 35* 33*  CREATININE 2.19* 1.67*  -Getting IVF w/ NS @ 75 mL/hr x 1 Day and will time out today  -Has a slight metabolic acidosis with a CO2 of 21, anion gap of 14, chloride level of 97 -Avoid Nephrotoxic Medications, Contrast Dyes, Hypotension and Dehydration to Ensure Adequate Renal Perfusion and will need to Renally Adjust Meds. CTM and Trend Renal Function carefully and repeat CMP in the AM    DMII: Semglee 6 units. Add Moderate Novolog  SSI AC; Last HbA1c was 7.3 and repeat is now 8.8. Trend CBGs; Current CBGs ranging from 94-338. Heart Healthy/Carb Modified Diet;    Chronic Diastolic CHF: C/w Hydralazine  25 mg po TID, and Isosorbide  Mononitrate 120 mg po Daily. CTM for S/Sx of Volume Overload as patient is beign hydrated as above.    Essential HTN: C/w Amlodipine  10 mg po Daily, Hydralazine  25 mg po TID, and Isosorbide  Mononitrate 120 mg po Daily. CTM BP per Protocol. Added IV Hydralazine  10 mg q6hprn for SBP>180 or DBP>100. Last BP reading was 128/44.   HLD: C/w Rosuvastatin  50 mg po Daily   Depression and Anxiety: C/w Sertraline  50 mg po Daily   [place] Malnutrition in the context of chronic illness / Underweight: Estimated body mass index is 18.01 kg/m as calculated from the following:   Height as of this encounter: 5' 4 (1.626 m).   Weight as of this encounter: 47.6 kg. Nutrition Consulted Nutrition Status: Nutrition Problem: Moderate Malnutrition Etiology: chronic illness (CKD 4, CHF) Signs/Symptoms: mild fat depletion, moderate muscle depletion Interventions: Refer to RD note for recommendations, Ensure Enlive (each supplement provides 350kcal and 20 grams of protein)  GERD/GI Prophylaxis: C/w PPI Substitution of Omeprazole  20 mg po Daily w/ Pantoprazole  40 mg po Daily   VTE Prophylaxis: For religious purposes the patient does not take any pork based products; continue with SCDs and early ambulation and  consider low-dose DOAC

## 2024-10-09 NOTE — Progress Notes (Signed)
   10/09/24 0030  Urine Measurement/Characteristics  Urinary Incontinence No  Urine Color Yellow/straw  Urine Appearance Clear  Time patient last voided or urinary catheter emptied 0020  Urinary Interventions Bladder scan;Post void residual  Bladder Scan Volume (mL) 81 mL  Post Void Residual 81 mL  Hygiene Peri care

## 2024-10-09 NOTE — Progress Notes (Signed)
 PROGRESS NOTE    [name]  FMW:[id number] DOB: 1961/03/18 DOA: 10/08/2024 PCP: Vicci Barnie NOVAK, MD   Brief Narrative:  [name] is a 63 y.o. female with medical history significant of for but not limited to CKD stage IV, chronic diastolic CHF, primary adrenal insufficiency, diabetes mellitus type 2, essential hypertension, hypothyroidism, cerebral ventriculomegaly and other comorbidities who presents with a toothache and facial swelling and pain for last few days.  Patient's noticed her face started swelling about 2 days ago and is usually normally alert and oriented x 4 but daughter states that she had difficulty getting herself dressed and was more confused.  She has been having ongoing issues with her teeth and had a tooth problem previously and was post go to to see a dentist today but given her confusion and our inability to figure out how to dress herself she was brought to the ED.  She had some pain in her right face and her right upper tooth area and endorses a headache no cervical spine pain.  Also had some pain in her jaw as well but denied any chest pain, nausea vomiting, abdominal pain or difficulty swallowing.  Patient continued to remain confused and given her symptoms the daughter was concerned of her facial swelling and brought her to the ED for further evaluation.  She denied any other concerns or complaints at this time.  TR[facility] was asked to admit this patient for further evaluation for facial cellulitis.     In the ED she received IV Solu-Cortef , IV fluid hydration, IV antibiotics and basic workup with a CBC, CMP, and imaging done of the head without contrast as well as imaging of the maxillofacial CT scan and a soft tissue neck scan   **Interim History Slowly improving and face is less swollen. Mentation is more appropriate but still mildly confused.  Assessment and Plan:  Facial Cellulitis: Likely an Odontogenic Origin. Check Blood Cx x2. Was not Septic on  admission but had a Leukocytosis (WBC of 15.1). Now WBC is 7.9 Admitted to Inpt Progressive. C/w IV Unasyn and IV Linezolid . Check Blood Cx x2 <12 hours -Maxillofacial CT done and showed Facial edema and inflammatory stranding predominantly at midline and on the right. Right periorbital swelling also present. Findings are compatible with cellulitis in the appropriate clinical setting. Consider an odontogenic infection given the provided history of tooth pain and given the multifocal periodontal disease described within the body of the report. No appreciable soft tissue abscess on this non-contrast examination. Mucosal thickening within the paranasal sinuses, as described. -CT Soft Tissue Neck w/o Contrast done and showed Facial cellulitis.  No definite abscess identified. -Pain control Acetaminophen  650 mg p.o./RC every 6 as needed for mild pain no fever, Tramadol  50 mg p.o. twice daily as needed for moderate pain and Fentanyl  12.5 mg IV every 2 as needed severe pain -Slowly improving however may need ID involvement and Surgical Evaluation if acutely worsens or fails to improve further    Confusion and Acute Encephalopathy in the setting of above: Improving. Head Imaging done and showed No evidence of an acute intracranial abnormality but did show  Prominence of the lateral and third ventricles which is similar to the prior MRI of 05/13/2021. -Per the Radiologist these findings could reflect central predominant atrophy. However, there are also imaging findings which can be seen in setting of normal pressure hydrocephalus and NPH is possible in the appropriate clinical setting. -Patient does have a Documented Hx of of Cerebral Ventriculomegaly -Checked  U/A and Negative; Check UDS and Negative -Will need PT/OT to evaluate and Treat   Hypothyroidism: TSH is 1.830; Resume Levothyroxine  150 mg po Daily   Primary Adrenal Insufficiency: S/p IV Solu-Cortef  100 mg x1 in the ED; Resume Home Hydrocortisone   20-30 mg po Daily    Hyponatremia: Na+ is now 131. C/w IVF Hydration as below and CTM and Trend Daily BMP  CKD Stage IV / Metabolic Acidosis: -BUN/Cr Trend: Recent Labs  Lab 10/08/24 1038 10/09/24 0345  BUN 35* 33*  CREATININE 2.19* 1.67*  -Getting IVF w/ NS @ 75 mL/hr x 1 Day and will time out today  -Has a slight metabolic acidosis with a CO2 of 21, anion gap of 14, chloride level of 97 -Avoid Nephrotoxic Medications, Contrast Dyes, Hypotension and Dehydration to Ensure Adequate Renal Perfusion and will need to Renally Adjust Meds. CTM and Trend Renal Function carefully and repeat CMP in the AM    DMII: Semglee 6 units. Add Moderate Novolog  SSI AC; Last HbA1c was 7.3 and repeat is now 8.8. Trend CBGs; Current CBGs ranging from 94-338. Heart Healthy/Carb Modified Diet;    Chronic Diastolic CHF: C/w Hydralazine  25 mg po TID, and Isosorbide  Mononitrate 120 mg po Daily. CTM for S/Sx of Volume Overload as patient is beign hydrated as above.    Essential HTN: C/w Amlodipine  10 mg po Daily, Hydralazine  25 mg po TID, and Isosorbide  Mononitrate 120 mg po Daily. CTM BP per Protocol. Added IV Hydralazine  10 mg q6hprn for SBP>180 or DBP>100. Last BP reading was 128/44.   HLD: C/w Rosuvastatin  50 mg po Daily   Depression and Anxiety: C/w Sertraline  50 mg po Daily   Nonsevere Malnutrition in the context of chronic illness / Underweight: Estimated body mass index is 18.01 kg/m as calculated from the following:   Height as of this encounter: 5' 4 (1.626 m).   Weight as of this encounter: 47.6 kg. Nutrition Consulted Nutrition Status: Nutrition Problem: Moderate Malnutrition Etiology: chronic illness (CKD 4, CHF) Signs/Symptoms: mild fat depletion, moderate muscle depletion Interventions: Refer to RD note for recommendations, Ensure Enlive (each supplement provides 350kcal and 20 grams of protein)  GERD/GI Prophylaxis: C/w PPI Substitution of Omeprazole  20 mg po Daily w/ Pantoprazole  40 mg po  Daily   VTE Prophylaxis: For religious purposes the patient does not take any pork based products; continue with SCDs and early ambulation and consider low-dose DOAC   DVT prophylaxis: SCDs Start: 10/08/24 2023    Code Status: Full Code Family Communication: D/w Daughter and Mother @ bedside   Disposition Plan:  Level of care: Progressive Status is: Inpatient Remains inpatient appropriate because: Needs further clinical improvement and transition to oral Abx   Consultants:  None  Procedures:  As delineated as above  Antimicrobials:  Anti-infectives (From admission, onward)    Start     Dose/Rate Route Frequency Ordered Stop   10/09/24 0400  Ampicillin-Sulbactam (UNASYN) 3 g in sodium chloride  0.9 % 100 mL IVPB        3 g 200 mL/hr over 30 Minutes Intravenous Every 12 hours 10/08/24 1758     10/08/24 2200  linezolid  (ZYVOX ) IVPB 600 mg        600 mg 300 mL/hr over 60 Minutes Intravenous Every 12 hours 10/08/24 1754     10/08/24 1500  Ampicillin-Sulbactam (UNASYN) 3 g in sodium chloride  0.9 % 100 mL IVPB        3 g 200 mL/hr over 30 Minutes Intravenous  Once 10/08/24 1458  10/08/24 1648       Subjective: Seen and examined at bedside and is much more awake and alert and appropriate.  Still very mild confused.  Face is not as tender and swelling is improving.  She feels fairly well.  No other concerns or complaints at this time.  Objective: Vitals:   10/09/24 0149 10/09/24 0546 10/09/24 0938 10/09/24 1256  BP: 101/78 (!) 147/53 (!) 157/66 (!) 128/44  Pulse: 85 71 78 73  Resp: 18 16  16   Temp: 99.1 F (37.3 C) 98.7 F (37.1 C)  98.4 F (36.9 C)  TempSrc: Oral Oral  Oral  SpO2: 99% 98%  100%  Weight:      Height:        Intake/Output Summary (Last 24 hours) at 10/09/2024 1532 Last data filed at 10/09/2024 1332 Gross per 24 hour  Intake 2359.29 ml  Output 500 ml  Net 1859.29 ml   Filed Weights   10/08/24 1027  Weight: 47.6 kg   Examination: Physical  Exam:  Constitutional: Thin female in no acute distress who is much more awake and appropriate today compared to yesterday Respiratory: Diminished to auscultation bilaterally, no wheezing, rales, rhonchi or crackles. Normal respiratory effort and patient is not tachypenic. No accessory muscle use.  Unlabored breathing Cardiovascular: RRR, no murmurs / rubs / gallops. S1 and S2 auscultated. No extremity edema. Abdomen: Soft, non-tender, non-distended. No masses palpated. No appreciable hepatosplenomegaly. Bowel sounds positive.  GU: Deferred. Musculoskeletal: No clubbing / cyanosis of digits/nails. No joint deformity upper and lower extremities.  Skin: Facial swelling and erythema is improving.  Right orbit is a little puffy now but not as much as it was yesterday Neurologic: CN 2-12 grossly intact with no focal deficits. Romberg sign and cerebellar reflexes not assessed.  Psychiatric: Normal judgment and insight. Alert and oriented x 3. Appears calm  Data Reviewed: I have personally reviewed following labs and imaging studies  CBC: Recent Labs  Lab 10/08/24 1038 10/09/24 0345  WBC 15.1* 7.9  NEUTROABS 11.7*  --   HGB 12.6 9.6*  HCT 40.5 30.2*  MCV 91.4 87.8  PLT 187 160   Basic Metabolic Panel: Recent Labs  Lab 10/08/24 1038 10/09/24 0345  NA 132* 131*  K 4.2 4.3  CL 95* 97*  CO2 22 21*  GLUCOSE 140* 231*  BUN 35* 33*  CREATININE 2.19* 1.67*  CALCIUM  9.5 8.7*  MG  --  1.9  PHOS  --  4.1   GFR: Estimated Creatinine Clearance: 25.6 mL/min (A) (by C-G formula based on SCr of 1.67 mg/dL (H)). Liver Function Tests: Recent Labs  Lab 10/08/24 1038 10/09/24 0345  AST 33 25  ALT 20 15  ALKPHOS 93 84  BILITOT 0.9 0.7  PROT 8.2* 6.6  ALBUMIN  4.2 3.6   No results for input(s): LIPASE, AMYLASE in the last 168 hours. No results for input(s): AMMONIA in the last 168 hours. Coagulation Profile: No results for input(s): INR, PROTIME in the last 168 hours. Cardiac  Enzymes: No results for input(s): CKTOTAL, CKMB, CKMBINDEX, TROPONINI in the last 168 hours. BNP (last 3 results) No results for input(s): PROBNP in the last 8760 hours. HbA1C: Recent Labs    10/09/24 0345  HGBA1C 8.8*   CBG: Recent Labs  Lab 10/08/24 1001 10/08/24 2201 10/09/24 0722 10/09/24 1148  GLUCAP 115* 94 225* 338*   Lipid Profile: No results for input(s): CHOL, HDL, LDLCALC, TRIG, CHOLHDL, LDLDIRECT in the last 72 hours. Thyroid  Function Tests: Recent Labs  10/09/24 0345  TSH 1.830   Anemia Panel: No results for input(s): VITAMINB12, FOLATE, FERRITIN, TIBC, IRON , RETICCTPCT in the last 72 hours. Sepsis Labs: No results for input(s): PROCALCITON, LATICACIDVEN in the last 168 hours.  Recent Results (from the past 240 hours)  Blood culture (routine x 2)     Status: None (Preliminary result)   Collection Time: 10/08/24  5:10 PM   Specimen: BLOOD  Result Value Ref Range Status   Specimen Description   Final    BLOOD RIGHT ANTECUBITAL Performed at [facility], [address] [place], [place] [address]    Special Requests   Final    BOTTLES DRAWN AEROBIC AND ANAEROBIC Blood Culture adequate volume Performed at [facility], [address], [place], [place] [address]    Culture   Final    NO GROWTH < 12 HOURS Performed at [facility], [address], [place], [place] [address]    Report Status PENDING  Incomplete  Blood culture (routine x 2)     Status: None (Preliminary result)   Collection Time: 10/08/24  7:31 PM   Specimen: BLOOD RIGHT HAND  Result Value Ref Range Status   Specimen Description   Final    BLOOD RIGHT HAND Performed at [facility], [address], [place], [place] [address]    Special Requests   Final    BOTTLES DRAWN AEROBIC AND ANAEROBIC Blood Culture adequate volume Performed at [facility], [address],  [place], [place] [address]    Culture   Final    NO GROWTH < 12 HOURS Performed at [facility], [address], [place], [place] [address]    Report Status PENDING  Incomplete    Radiology Studies: CT Soft Tissue Neck Wo Contrast Result Date: 10/08/2024 CLINICAL DATA:  Right facial edema, tooth pain, swelling and confusion EXAM: CT NECK WITHOUT CONTRAST TECHNIQUE: Multidetector CT imaging of the neck was performed following the standard protocol without intravenous contrast. RADIATION DOSE REDUCTION: This exam was performed according to the departmental dose-optimization program which includes automated exposure control, adjustment of the mA and/or kV according to patient size and/or use of iterative reconstruction technique. COMPARISON:  None Available. FINDINGS: Pharynx and larynx: The nasopharynx, oropharynx and hypopharynx are normal. The larynx is normal. The floor of the mouth appears normal. Salivary glands: The submandibular glands and parotid glands are normal. Thyroid : Normal Lymph nodes: No adenopathy identified. Limited intracranial: No significant abnormality Visualized orbits: Normal Mastoids and visualized paranasal sinuses: No significant abnormality Skeleton: No significant abnormality Upper chest: No significant abnormality Other: There is stranding in the right facial soft tissues. No definite abscess identified. IMPRESSION: Facial cellulitis.  No definite abscess identified. Electronically Signed   By: Nancyann Burns M.D.   On: 10/08/2024 16:26   CT Maxillofacial Wo Contrast Result Date: 10/08/2024 CLINICAL DATA:  Provided history: Deep space facial infection. GFR < 30. EXAM: CT MAXILLOFACIAL WITHOUT CONTRAST TECHNIQUE: Multidetector CT imaging of the maxillofacial structures was performed. Multiplanar CT image reconstructions were also generated. RADIATION DOSE REDUCTION: This exam was performed according to the departmental dose-optimization program which includes automated  exposure control, adjustment of the mA and/or kV according to patient size and/or use of iterative reconstruction technique. COMPARISON:  None. FINDINGS: Osseous: Periapical lucency (consistent with periodontal disease) surrounding the left mandibular medial and lateral incisor teeth, right mandibular medial incisor tooth, right maxillary first premolar tooth, right maxillary canine tooth, right maxillary  lateral and medial incisor teeth, left maxillary medial incisor tooth, left maxillary canine tooth and left maxillary first premolar tooth. Background poor dentition with multiple absent teeth. No maxillofacial fracture is identified. Orbits: Right periorbital soft tissue swelling. No acute finding within the orbits. Sinuses: Mild-to-moderate mucosal thickening within the right maxillary sinus. Trace mucosal thickening within the left maxillary sinus. Minimal mucosal thickening scattered within bilateral ethmoid air cells. Soft tissues: Facial edema and inflammatory stranding predominantly at midline and on the right. No evidence of a soft tissue abscess on this non-contrast examination. Limited intracranial: Intracranial findings separately reported on same day head CT. IMPRESSION: 1. Facial edema and inflammatory stranding predominantly at midline and on the right. Right periorbital swelling also present. Findings are compatible with cellulitis in the appropriate clinical setting. Consider an odontogenic infection given the provided history of tooth pain and given the multifocal periodontal disease described within the body of the report. No appreciable soft tissue abscess on this non-contrast examination. 2. Mucosal thickening within the paranasal sinuses, as described. Electronically Signed   By: Rockey Childs D.O.   On: 10/08/2024 14:46   CT Head Wo Contrast Result Date: 10/08/2024 CLINICAL DATA:  Provided history: Mental status change, unknown cause. Possible trauma. EXAM: CT HEAD WITHOUT CONTRAST  TECHNIQUE: Contiguous axial images were obtained from the base of the skull through the vertex without intravenous contrast. RADIATION DOSE REDUCTION: This exam was performed according to the departmental dose-optimization program which includes automated exposure control, adjustment of the mA and/or kV according to patient size and/or use of iterative reconstruction technique. COMPARISON:  Brain MRI 04/23/2021. FINDINGS: Brain: Prominence of the lateral and third ventricles, similar to the prior MRI of 05/13/2021. This could be secondary to central predominant atrophy. However, there is also an acute callosal angle and some crowding of the sulci at the level of the high frontoparietal lobes, and these findings can be seen in setting of normal pressure hydrocephalus (NPH). There is no acute intracranial hemorrhage. No demarcated cortical infarct. No extra-axial fluid collection. No evidence of an intracranial mass. No midline shift. Vascular: No hyperdense vessel. Atherosclerotic calcifications. Skull: No calvarial fracture or aggressive osseous lesion. Sinuses/Orbits: Please refer to the same day maxillofacial CT for description of maxillofacial findings. IMPRESSION: 1. No evidence of an acute intracranial abnormality. 2. Prominence of the lateral and third ventricles which is similar to the prior MRI of 05/13/2021. This could reflect central predominant atrophy. However, there are also imaging findings which can be seen in setting of normal pressure hydrocephalus and NPH is possible in the appropriate clinical setting. Electronically Signed   By: Rockey Childs D.O.   On: 10/08/2024 14:08   DG Chest Portable 1 View Result Date: 10/08/2024 EXAM: 1 VIEW XRAY OF THE CHEST 10/08/2024 11:17:00 AM COMPARISON: 10/08/23. CLINICAL HISTORY: AMS, ?trauma. Pt family states facial swelling started started two days ago and gradually got worse. Pt is also having headache and confusion. FINDINGS: LUNGS AND PLEURA: No focal  pulmonary opacity. No pulmonary edema. No pleural effusion. No pneumothorax. HEART AND MEDIASTINUM: No acute abnormality of the cardiac and mediastinal silhouettes. BONES AND SOFT TISSUES: No acute osseous abnormality. IMPRESSION: 1. No acute process. Electronically signed by: Waddell Calk MD 10/08/2024 12:14 PM EDT RP Workstation: [id number]   Scheduled Meds:  amLODipine   10 mg Oral Daily   aspirin   81 mg Oral Daily   cyanocobalamin   1,000 mcg Oral Daily   feeding supplement  237 mL Oral BID BM   fluticasone   1  spray Each Nare Daily   hydrALAZINE   25 mg Oral TID   hydrocortisone   20 mg Oral Daily   insulin  aspart  0-15 Units Subcutaneous TID WC   insulin  aspart  0-5 Units Subcutaneous QHS   insulin  glargine-yfgn  6 Units Subcutaneous QHS   isosorbide  mononitrate  120 mg Oral Daily   levothyroxine   150 mcg Oral Q0600   multivitamin with minerals  1 tablet Oral Daily   pantoprazole   40 mg Oral Daily   rosuvastatin   10 mg Oral Daily   senna  1 tablet Oral BID   sertraline   50 mg Oral Daily   Continuous Infusions:  sodium chloride  75 mL/hr at 10/09/24 1338   ampicillin-sulbactam (UNASYN) IV 3 g (10/09/24 0241)   linezolid  (ZYVOX ) IV 600 mg (10/09/24 1041)    LOS: 1 day   Alejandro Marker, DO [facility] Available via Epic secure chat 7am-7pm After these hours, please refer to coverage provider listed on [email or web address] 10/09/2024, 3:32 PM

## 2024-10-09 NOTE — TOC Initial Note (Signed)
 Transition of Care [facility]) - Initial/Assessment Note   Patient Details  Name: [name] MRN: [id number] Date of Birth: May 12, 1961  [facility]) CM/SW Contact:    Duwaine GORMAN Aran, LCSW Phone Number: 10/09/2024, 9:13 AM  Clinical Narrative: Patient is from home with daughter. Patient is currently receiving IV antibiotics. Care management following for possible discharge needs.  Expected Discharge Plan: Home/Self Care Barriers to Discharge: Continued Medical Work up  Expected Discharge Plan and Services In-house Referral: Clinical Social Work Living arrangements for the past 2 months: Single Family Home           DME Arranged: N/A DME Agency: NA  Prior Living Arrangements/Services Living arrangements for the past 2 months: Single Family Home Lives with:: Adult Children Patient language and need for interpreter reviewed:: Yes Need for Family Participation in Patient Care: Yes (Comment) Care giver support system in place?: Yes (comment) Criminal Activity/Legal Involvement Pertinent to Current Situation/Hospitalization: No - Comment as needed  Activities of Daily Living ADL Screening (condition at time of admission) Independently performs ADLs?: Yes (appropriate for developmental age) Is the patient deaf or have difficulty hearing?: No Does the patient have difficulty seeing, even when wearing glasses/contacts?: No Does the patient have difficulty concentrating, remembering, or making decisions?: No  Emotional Assessment Orientation: : Oriented to Self, Oriented to Place, Oriented to Situation Alcohol / Substance Use: Not Applicable Psych Involvement: No (comment)  Admission diagnosis:  Confusion [R41.0] AKI (acute kidney injury) [N17.9] Facial cellulitis [L03.211] Patient Active Problem List   Diagnosis Date Noted   Facial cellulitis 10/08/2024   Protein-calorie malnutrition, severe 06/06/2021   Bacteremia due to methicillin susceptible Staphylococcus aureus  (MSSA) 06/05/2021   CHF exacerbation (HCC) 06/05/2021   Volume overload 06/05/2021   Respiratory failure (HCC) 06/05/2021   AKI (acute kidney injury) 06/01/2021   Chronic diastolic heart failure (HCC) 06/01/2021   DM2 (diabetes mellitus, type 2) (HCC) 06/01/2021   Dizziness 05/13/2021   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   Hypokalemia 05/12/2021   Fecal impaction (HCC) 05/12/2021   Anemia due to chronic kidney disease 05/12/2021   Adrenal insufficiency (Addison's disease) (HCC) 05/12/2021   CKD (chronic kidney disease) stage 4, GFR 15-29 ml/min (HCC) 11/21/2020   Primary hypertension 11/21/2020   New onset of congestive heart failure (HCC) 11/21/2020   Dehydration 12/09/2017   Abdominal pain, bilateral lower quadrant 12/09/2017   Hypoglycemia 07/08/2017   Hypoglycemia associated with diabetes (HCC) 05/10/2017   Malnutrition of moderate degree 05/10/2017   PCP:  Vicci Barnie NOVAK, MD Pharmacy:   [facility] [address] [place] [address] Phone: [phone] Fax: [phone]  [id number] - [place], [place] - [address] [place] [address] Phone: [phone] Fax: [phone]  Social Drivers of Health (SDOH) Social History: SDOH Screenings   Food Insecurity: No Food Insecurity (10/08/2024)  Housing: Low Risk  (10/08/2024)  Transportation Needs: No Transportation Needs (10/08/2024)  Utilities: Not At Risk (10/08/2024)  Alcohol Screen: Low Risk  (04/26/2024)  Depression (PHQ2-9): Medium Risk (06/11/2024)  Financial Resource Strain: Low Risk  (04/26/2024)  Physical Activity: Insufficiently Active (04/26/2024)  Social Connections: Socially Isolated (04/26/2024)  Stress: No Stress Concern Present (04/26/2024)  Tobacco Use: Low Risk  (10/08/2024)  Health Literacy: Adequate Health Literacy (04/26/2024)   SDOH Interventions:    Readmission Risk Interventions    10/09/2024    9:09 AM   Readmission Risk Prevention Plan  Transportation Screening Complete  HRI or Home Care Consult Complete  Social Work Consult for Recovery Care Planning/Counseling Complete  Palliative Care Screening Not Applicable  Medication Review Oceanographer) Complete

## 2024-10-10 DIAGNOSIS — L03211 Cellulitis of face: Secondary | ICD-10-CM | POA: Diagnosis not present

## 2024-10-10 LAB — COMPREHENSIVE METABOLIC PANEL WITH GFR
ALT: 16 U/L (ref 0–44)
AST: 25 U/L (ref 15–41)
Albumin: 3.4 g/dL — ABNORMAL LOW (ref 3.5–5.0)
Alkaline Phosphatase: 68 U/L (ref 38–126)
Anion gap: 9 (ref 5–15)
BUN: 29 mg/dL — ABNORMAL HIGH (ref 8–23)
CO2: 23 mmol/L (ref 22–32)
Calcium: 8.4 mg/dL — ABNORMAL LOW (ref 8.9–10.3)
Chloride: 103 mmol/L (ref 98–111)
Creatinine, Ser: 1.57 mg/dL — ABNORMAL HIGH (ref 0.44–1.00)
GFR, Estimated: 36 mL/min — ABNORMAL LOW (ref >60)
Glucose, Bld: 206 mg/dL — ABNORMAL HIGH (ref 70–99)
Potassium: 4 mmol/L (ref 3.5–5.1)
Sodium: 136 mmol/L (ref 135–145)
Total Bilirubin: 0.5 mg/dL (ref 0.0–1.2)
Total Protein: 5.9 g/dL — ABNORMAL LOW (ref 6.5–8.1)

## 2024-10-10 LAB — CBC WITH DIFFERENTIAL/PLATELET
Abs Immature Granulocytes: 0.04 K/uL (ref 0.00–0.07)
Basophils Absolute: 0 K/uL (ref 0.0–0.1)
Basophils Relative: 0 %
Eosinophils Absolute: 0 K/uL (ref 0.0–0.5)
Eosinophils Relative: 0 %
HCT: 25.6 % — ABNORMAL LOW (ref 36.0–46.0)
Hemoglobin: 8.2 g/dL — ABNORMAL LOW (ref 12.0–15.0)
Immature Granulocytes: 0 %
Lymphocytes Relative: 15 %
Lymphs Abs: 1.5 K/uL (ref 0.7–4.0)
MCH: 28.6 pg (ref 26.0–34.0)
MCHC: 32 g/dL (ref 30.0–36.0)
MCV: 89.2 fL (ref 80.0–100.0)
Monocytes Absolute: 0.4 K/uL (ref 0.1–1.0)
Monocytes Relative: 4 %
Neutro Abs: 8 K/uL — ABNORMAL HIGH (ref 1.7–7.7)
Neutrophils Relative %: 81 %
Platelets: 145 K/uL — ABNORMAL LOW (ref 150–400)
RBC: 2.87 MIL/uL — ABNORMAL LOW (ref 3.87–5.11)
RDW: 12.6 % (ref 11.5–15.5)
WBC: 9.9 K/uL (ref 4.0–10.5)
nRBC: 0 % (ref 0.0–0.2)

## 2024-10-10 LAB — PROCALCITONIN: Procalcitonin: 1.58 ng/mL

## 2024-10-10 LAB — MAGNESIUM: Magnesium: 2.1 mg/dL (ref 1.7–2.4)

## 2024-10-10 LAB — GLUCOSE, CAPILLARY
Glucose-Capillary: 175 mg/dL — ABNORMAL HIGH (ref 70–99)
Glucose-Capillary: 289 mg/dL — ABNORMAL HIGH (ref 70–99)
Glucose-Capillary: 196 mg/dL — ABNORMAL HIGH (ref 70–99)
Glucose-Capillary: 185 mg/dL — ABNORMAL HIGH (ref 70–99)

## 2024-10-10 LAB — PHOSPHORUS: Phosphorus: 1.8 mg/dL — ABNORMAL LOW (ref 2.5–4.6)

## 2024-10-10 LAB — LACTIC ACID, PLASMA: Lactic Acid, Venous: 0.8 mmol/L (ref 0.5–1.9)

## 2024-10-10 NOTE — Evaluation (Signed)
 Physical Therapy Evaluation Patient Details Name: Kenyetta Wimbish MRN: [id number] DOB: 05-11-1961 Today's Date: 10/10/2024  History of Present Illness  63 year old female who presented to the ED with new onset of confusion.  Patient had c/o toothache and facial swelling with pain over previous days when daughter observed patient having difficulty with ADL tasks and brought her to the ED.  Dx with acute encephalopathy due to facial cellulitis.  PMHx includes DM II, HTN, CHF, sinus bradycardia, CKD IV, depression, cerebral ventriculomegaly, adrenal insufficiency   Clinical Impression  On eval, pt required CGA for mobility. She ambulated ~100 feet with IV pole in addition to intermittent use of hallway handrail. Intermittent unsteadiness observed throughout distance. Pt tolerated distance well. Pt reports recent hx of falls. She uses a cane at baseline. Could consider use of a walker for improved stability. Will plan to follow and progress activity as tolerated. Will recommend OP PT f/u for balance training, if pt/family are agreeable.       If plan is discharge home, recommend the following: Assist for transportation;Assistance with cooking/housework;Help with stairs or ramp for entrance   Can travel by private vehicle        Equipment Recommendations None recommended by PT (pt reports she has access to DME)  Recommendations for Other Services       Functional Status Assessment Patient has had a recent decline in their functional status and demonstrates the ability to make significant improvements in function in a reasonable and predictable amount of time.     Precautions / Restrictions Precautions Precautions: Fall Restrictions Weight Bearing Restrictions Per Provider Order: No      Mobility  Bed Mobility Overal bed mobility: Modified Independent                  Transfers Overall transfer level: Modified independent   Transfers: Sit to/from Stand                   Ambulation/Gait Ambulation/Gait assistance: Contact guard assist Gait Distance (Feet): 100 Feet Assistive device: IV Pole Gait Pattern/deviations: Step-through pattern, Decreased stride length       General Gait Details: Intermittent unsteadiness. Pt intermittently reaching out for hallway handrail to steady, in addition to IV pole. Tolerated distance well. Fair gait speed.  Stairs            Wheelchair Mobility     Tilt Bed    Modified Rankin (Stroke Patients Only)       Balance Overall balance assessment: History of Falls   Sitting balance-Leahy Scale: Normal     Standing balance support: During functional activity Standing balance-Leahy Scale: Fair                               Pertinent Vitals/Pain Pain Assessment Pain Assessment: Faces Faces Pain Scale: Hurts little more Pain Location: headache Pain Intervention(s): Monitored during session, Premedicated before session    Home Living Family/patient expects to be discharged to:: Private residence Living Arrangements: Children Available Help at Discharge: Family;Available 24 hours/day Type of Home: House Home Access: Level entry       Home Layout: Two level;Able to live on main level with bedroom/bathroom Home Equipment: Cane - single point;Grab bars - tub/shower      Prior Function Prior Level of Function : History of Falls (last six months)             Mobility Comments: uses cane for  ambulation ADLs Comments: mod independent     Extremity/Trunk Assessment   Upper Extremity Assessment Upper Extremity Assessment: Defer to OT evaluation    Lower Extremity Assessment Lower Extremity Assessment: Overall WFL for tasks assessed    Cervical / Trunk Assessment Cervical / Trunk Assessment: Normal  Communication   Communication Communication: No apparent difficulties    Cognition Arousal: Alert Behavior During Therapy: WFL for tasks assessed/performed   PT -  Cognitive impairments: No apparent impairments                         Following commands: Intact       Cueing Cueing Techniques: Verbal cues     General Comments General comments (skin integrity, edema, etc.): Patient exhibited mild impulsivity during functional mobility but responded to verbal cues.  Distraction due to leaking IV in LUE likley a factor.    Exercises     Assessment/Plan    PT Assessment Patient needs continued PT services  PT Problem List Decreased balance;Decreased knowledge of use of DME       PT Treatment Interventions Gait training;DME instruction;Functional mobility training;Therapeutic activities;Therapeutic exercise;Patient/family education;Balance training    PT Goals (Current goals can be found in the Care Plan section)  Acute Rehab PT Goals Patient Stated Goal: none stated PT Goal Formulation: With patient Time For Goal Achievement: 10/24/24 Potential to Achieve Goals: Good    Frequency Min 3X/week     Co-evaluation               AM-PAC PT 6 Clicks Mobility  Outcome Measure Help needed turning from your back to your side while in a flat bed without using bedrails?: None Help needed moving from lying on your back to sitting on the side of a flat bed without using bedrails?: None Help needed moving to and from a bed to a chair (including a wheelchair)?: None Help needed standing up from a chair using your arms (e.g., wheelchair or bedside chair)?: None Help needed to walk in hospital room?: A Little Help needed climbing 3-5 steps with a railing? : A Little 6 Click Score: 22    End of Session Equipment Utilized During Treatment: Gait belt Activity Tolerance: Patient tolerated treatment well Patient left: in bed;with call bell/phone within reach   PT Visit Diagnosis: History of falling (Z91.81);Unsteadiness on feet (R26.81)    Time: 1031-1040 PT Time Calculation (min) (ACUTE ONLY): 9 min   Charges:   PT  Evaluation $PT Eval Low Complexity: 1 Low   PT General Charges $$ ACUTE PT VISIT: 1 Visit            Dannial SQUIBB, PT Acute Rehabilitation  Office: [phone]

## 2024-10-10 NOTE — TOC Progression Note (Signed)
 Transition of Care [facility]) - Progression Note   Patient Details  Name: [name] MRN: [id number] Date of Birth: 07/12/1961  Transition of Care [facility]) CM/SW Contact  Duwaine GORMAN Aran, LCSW Phone Number: 10/10/2024, 3:14 PM  Clinical Narrative: PT evaluation recommended OPPT. CSW met with patient to discuss recommendation. Patient politely declined OPPT referral at this time.  Expected Discharge Plan: Home/Self Care Barriers to Discharge: Continued Medical Work up  Expected Discharge Plan and Services In-house Referral: Clinical Social Work Living arrangements for the past 2 months: Single Family Home           DME Arranged: N/A DME Agency: NA  Social Drivers of Health (SDOH) Interventions SDOH Screenings   Food Insecurity: No Food Insecurity (10/08/2024)  Housing: Low Risk  (10/08/2024)  Transportation Needs: No Transportation Needs (10/08/2024)  Utilities: Not At Risk (10/08/2024)  Alcohol Screen: Low Risk  (04/26/2024)  Depression (PHQ2-9): Medium Risk (06/11/2024)  Financial Resource Strain: Low Risk  (04/26/2024)  Physical Activity: Insufficiently Active (04/26/2024)  Social Connections: Socially Isolated (04/26/2024)  Stress: No Stress Concern Present (04/26/2024)  Tobacco Use: Low Risk  (10/08/2024)  Health Literacy: Adequate Health Literacy (04/26/2024)   Readmission Risk Interventions    10/09/2024    9:09 AM  Readmission Risk Prevention Plan  Transportation Screening Complete  HRI or Home Care Consult Complete  Social Work Consult for Recovery Care Planning/Counseling Complete  Palliative Care Screening Not Applicable  Medication Review Oceanographer) Complete

## 2024-10-10 NOTE — Evaluation (Deleted)
 Occupational Therapy Evaluation Patient Details Name: [name] MRN: [id number] DOB: 1961-01-21 Today's Date: 10/10/2024   History of Present Illness   Patient is a 63 year old female who presented to the ED with new onset of confusion.  Patient had c/o toothache and facial swelling with pain over previous days when daughter observed patient having difficulty with ADL tasks and brought her to the ED.  Dx with acute encephalopathy due to facial cellulitis.  PMHx includes DM II, HTN, CHF, sinus bradycardia, CKD IV, depression     Clinical Impressions PTA, patient was living with daughter & daughter's family and was independent with ADL tasks.  Patient manages her own medication with use of dosage blister packs.  Acute illness has contributed to a decline in strength and activity tolerance which can impact independence with self-care and safety with functional mobility.  Patient will benefit from acute OT for UB strengthening and for training in safety strategies to support safe return home.  Patient and family are agreeable for home health OT services after discharge to continue progression of function.  Acute OT will continue to follow patient while in the hospital.  Thank you for allowing us  to participate in the care of this patient.    If plan is discharge home, recommend the following:   A little help with walking and/or transfers     Functional Status Assessment   Patient has had a recent decline in their functional status and demonstrates the ability to make significant improvements in function in a reasonable and predictable amount of time.     Equipment Recommendations   BSC/3in1      Precautions/Restrictions   Precautions Precautions: Fall Recall of Precautions/Restrictions: Intact Precaution/Restrictions Comments: Patient stood from EOB without assistance but returned to sitting when instructed. Restrictions Weight Bearing Restrictions Per Provider Order:  No     Mobility Bed Mobility Overal bed mobility: Modified Independent General bed mobility comments: Supine > sit using bed rail Patient Response: Cooperative  Transfers Overall transfer level: Needs assistance Transfers: Sit to/from Stand Sit to Stand: Contact guard assist General transfer comment: Sit > stand from EOB; stand > sit to recliner      Balance Overall balance assessment: Mild deficits observed, not formally tested Sitting-balance support: No upper extremity supported, Feet supported Sitting balance-Leahy Scale: Good    Standing balance support: Single extremity supported, During functional activity Standing balance-Leahy Scale: Good     ADL either performed or assessed with clinical judgement   ADL Overall ADL's : Needs assistance/impaired Eating/Feeding: Modified independent   Grooming: Wash/dry hands;Modified independent Grooming Details (indicate cue type and reason): Patient walked to bathroom to cleanse blood from LUE 2/2 IV leak Upper Body Bathing: Supervision/ safety Lower Body Bathing: Supervison/ safety Upper Body Dressing : Supervision/safety Lower Body Dressing: Sitting/lateral leans;Supervision/safety Lower Body Dressing Details (indicate cue type and reason): Doffed and donned sock sitting EOB Toilet Transfer: Supervision/safety Toileting- Clothing Manipulation and Hygiene: Supervision/safety   Functional mobility during ADLs: Contact guard assist (Held IV pole with left hand for stability)       Vision Baseline Vision/History: 0 No visual deficits Ability to See in Adequate Light: 0 Adequate Patient Visual Report: No change from baseline Vision Assessment?: Wears glasses for reading            Pertinent Vitals/Pain Pain Assessment Pain Assessment: 0-10 Pain Score: 6  Pain Descriptors / Indicators: Aching, Headache Pain Intervention(s): Monitored during session, Ice applied, Other (comment) (RN administered meds prior to session)  Extremity/Trunk Assessment Upper Extremity Assessment Upper Extremity Assessment: Generalized weakness (Grossly 4-/5)   Lower Extremity Assessment Lower Extremity Assessment: Overall WFL for tasks assessed   Cervical / Trunk Assessment Cervical / Trunk Assessment: Normal   Communication Communication Communication: No apparent difficulties   Cognition Arousal: Alert Behavior During Therapy: WFL for tasks assessed/performed, Impulsive   Awareness: Online awareness impaired, Intellectual awareness intact   Attention impairment (select first level of impairment): Selective attention   OT - Cognition Comments: Patient's IV in left arm began to leak; patient was distracted by that.  RN summoned and corrected.   Following commands: Intact       General Comments   Patient exhibited mild impulsivity during functional mobility but responded to verbal cues.  Distraction due to leaking IV in LUE likley a factor.           Home Living Family/patient expects to be discharged to:: Private residence Living Arrangements: Children (Daughter & family) Available Help at Discharge: Family;Available 24 hours/day Type of Home: House Home Access: Level entry Home Layout: Two level;Able to live on main level with bedroom/bathroom   Bathroom Shower/Tub: Engineer, manufacturing systems: Standard Bathroom Accessibility: Yes How Accessible: Accessible via walker Home Equipment: Cane - single point;Grab bars - tub/shower      Prior Functioning/Environment Prior Level of Function : Independent/Modified Independent   Mobility Comments: Patient endorses using SPC; family endorses incorrect technique ADLs Comments: Patient reports no difficulties at [facility]    OT Problem List: Decreased strength;Decreased activity tolerance;Decreased safety awareness   OT Treatment/Interventions: Self-care/ADL training;Therapeutic exercise;Energy conservation;Therapeutic activities;Patient/family education       OT Goals(Current goals can be found in the care plan section)   Acute Rehab OT Goals Patient Stated Goal: Return home; get toothache addressed OT Goal Formulation: With patient/family Time For Goal Achievement: 10/24/24 Potential to Achieve Goals: Good ADL Goals Pt Will Perform Lower Body Dressing: with modified independence Pt Will Transfer to Toilet: with modified independence Pt Will Perform Toileting - Clothing Manipulation and hygiene: with modified independence Pt/caregiver will Perform Home Exercise Program: Increased strength;Both right and left upper extremity;With theraband;With written HEP provided;Independently   OT Frequency:  Min 2X/week       AM-PAC OT 6 Clicks Daily Activity     Outcome Measure Help from another person eating meals?: None Help from another person taking care of personal grooming?: None Help from another person toileting, which includes using toliet, bedpan, or urinal?: A Little Help from another person bathing (including washing, rinsing, drying)?: A Little Help from another person to put on and taking off regular upper body clothing?: A Little Help from another person to put on and taking off regular lower body clothing?: A Little 6 Click Score: 20   End of Session Equipment Utilized During Treatment: Gait belt;Other (comment) (IV pole) Nurse Communication: Other (comment) (RN verbalized patient OK for ice pack for toothache)  Activity Tolerance: Patient tolerated treatment well;No increased pain Patient left: in chair;with call bell/phone within reach;with chair alarm set;with family/visitor present  OT Visit Diagnosis: Muscle weakness (generalized) (M62.81)                Time: 8894-8862 OT Time Calculation (min): 32 min Charges:  OT General Charges $OT Visit: 1 Visit OT Evaluation $OT Eval Low Complexity: 1 Low OT Treatments $Self Care/Home Management : 23-37 mins  Edna Grover B. Alekhya Gravlin, MS, OTR/L 10/10/2024, 12:06 PM

## 2024-10-10 NOTE — Evaluation (Signed)
 Occupational Therapy Evaluation Patient Details Name: [name] MRN: [id number] DOB: 17-Jan-1961 Today's Date: 10/10/2024   History of Present Illness   63 year old female who presented to the ED with new onset of confusion.  Patient had c/o toothache and facial swelling with pain over previous days when daughter observed patient having difficulty with ADL tasks and brought her to the ED.  Dx with acute encephalopathy due to facial cellulitis.  PMHx includes DM II, HTN, CHF, sinus bradycardia, CKD IV, depression, cerebral ventriculomegaly, adrenal insufficiency     Clinical Impressions PTA, patient was living with daughter and daughter's family on ground floor of their home.  Patient was independent with self-care and managed her own meds using organized blister packs.  Acute illness has led to decreased strength and activity tolerance which impact patient's independence with self-care and safety with functional mobility.  Patient will benefit from acute OT for UB strengthening and for training in safety strategies.  Patient's plan is to return home with outpatient PT recommended.  Acute OT will continue to follow patient while in the hospital to address needs mentioned above.  Thank you for allowing us  to participate in the care of this patient.     If plan is discharge home, recommend the following:   A little help with walking and/or transfers     Functional Status Assessment   Patient has had a recent decline in their functional status and demonstrates the ability to make significant improvements in function in a reasonable and predictable amount of time.     Equipment Recommendations   BSC/3in1      Precautions/Restrictions   Precautions Precautions: Fall Recall of Precautions/Restrictions: Intact Precaution/Restrictions Comments: Patient stood from EOB without assistance but returned to sitting when instructed. Restrictions Weight Bearing Restrictions Per  Provider Order: No     Mobility Bed Mobility Overal bed mobility: Modified Independent   General bed mobility comments: Supine > sit using bed rail Patient Response: Cooperative  Transfers Overall transfer level: Needs assistance   Transfers: Sit to/from Stand Sit to Stand: Contact guard assist   General transfer comment: Sit > stand from EOB; stand > sit to recliner      Balance Overall balance assessment: Mild deficits observed, not formally tested Sitting-balance support: No upper extremity supported, Feet supported Sitting balance-Leahy Scale: Good   Standing balance support: Single extremity supported, During functional activity Standing balance-Leahy Scale: Good     ADL either performed or assessed with clinical judgement   ADL Overall ADL's : Needs assistance/impaired Eating/Feeding: Modified independent   Grooming: Wash/dry hands;Modified independent Grooming Details (indicate cue type and reason): Patient walked to bathroom to cleanse blood from LUE 2/2 IV leak Upper Body Bathing: Supervision/ safety   Lower Body Bathing: Supervison/ safety   Upper Body Dressing : Supervision/safety   Lower Body Dressing: Sitting/lateral leans;Supervision/safety Lower Body Dressing Details (indicate cue type and reason): Doffed and donned sock sitting EOB Toilet Transfer: Supervision/safety   Toileting- Clothing Manipulation and Hygiene: Supervision/safety   Functional mobility during ADLs: Contact guard assist (Held IV pole with left hand for stability)       Vision Baseline Vision/History: 0 No visual deficits Ability to See in Adequate Light: 0 Adequate Patient Visual Report: No change from baseline Vision Assessment?: Wears glasses for reading            Pertinent Vitals/Pain Pain Assessment Pain Assessment: 0-10 Pain Score: 6  Pain Descriptors / Indicators: Aching, Headache Pain Intervention(s): Monitored during session, Ice applied,  Other (comment) (RN  administered meds prior to session)     Extremity/Trunk Assessment Upper Extremity Assessment Upper Extremity Assessment: Defer to OT evaluation   Lower Extremity Assessment Lower Extremity Assessment: Overall WFL for tasks assessed   Cervical / Trunk Assessment Cervical / Trunk Assessment: Normal   Communication Communication Communication: No apparent difficulties   Cognition Arousal: Alert Behavior During Therapy: WFL for tasks assessed/performed, Impulsive   Awareness: Online awareness impaired, Intellectual awareness intact   Attention impairment (select first level of impairment): Selective attention   OT - Cognition Comments: Patient's IV in left arm began to leak; patient was distracted by that.  RN summoned and corrected.   Following commands: Intact       General Comments   Patient exhibited mild impulsivity during functional mobility but responded to verbal cues.  Distraction due to leaking IV in LUE likley a factor.           Home Living Family/patient expects to be discharged to:: Private residence Living Arrangements: Children Available Help at Discharge: Family;Available 24 hours/day Type of Home: House Home Access: Level entry   Home Layout: Two level;Able to live on main level with bedroom/bathroom   Bathroom Shower/Tub: Chief Strategy Officer: Standard Bathroom Accessibility: Yes How Accessible: Accessible via walker Home Equipment: Cane - single point;Grab bars - tub/shower      Prior Functioning/Environment Prior Level of Function : History of Falls (last six months)   Mobility Comments: uses cane for ambulation ADLs Comments: mod independent    OT Problem List: Decreased strength;Decreased activity tolerance;Decreased safety awareness   OT Treatment/Interventions: Self-care/ADL training;Therapeutic exercise;Energy conservation;Therapeutic activities;Patient/family education      OT Goals(Current goals can be found in the  care plan section)   Acute Rehab OT Goals Patient Stated Goal: Return home; get toothache addressed OT Goal Formulation: With patient/family Time For Goal Achievement: 10/24/24 Potential to Achieve Goals: Good ADL Goals Pt Will Perform Lower Body Dressing: with modified independence Pt Will Transfer to Toilet: with modified independence Pt Will Perform Toileting - Clothing Manipulation and hygiene: with modified independence Pt/caregiver will Perform Home Exercise Program: Increased strength;Both right and left upper extremity;With theraband;With written HEP provided;Independently   OT Frequency:  Min 2X/week       AM-PAC OT 6 Clicks Daily Activity     Outcome Measure Help from another person eating meals?: None Help from another person taking care of personal grooming?: None Help from another person toileting, which includes using toliet, bedpan, or urinal?: A Little Help from another person bathing (including washing, rinsing, drying)?: A Little Help from another person to put on and taking off regular upper body clothing?: A Little Help from another person to put on and taking off regular lower body clothing?: A Little 6 Click Score: 20   End of Session Equipment Utilized During Treatment: Gait belt;Other (comment) (IV pole) Nurse Communication: Other (comment) (RN verbalized patient OK for ice pack for toothache)  Activity Tolerance: Patient tolerated treatment well;No increased pain Patient left: in chair;with call bell/phone within reach;with chair alarm set;with family/visitor present  OT Visit Diagnosis: Muscle weakness (generalized) (M62.81)                Time: 8894-8862 OT Time Calculation (min): 32 min Charges:  OT General Charges $OT Visit: 1 Visit OT Evaluation $OT Eval Low Complexity: 1 Low OT Treatments $Self Care/Home Management : 23-37 mins  Gervase Colberg B. Camelia Stelzner, MS, OTR/L 10/10/2024, 1:19 PM

## 2024-10-10 NOTE — Progress Notes (Signed)
 PROGRESS NOTE    [name]  FMW:[id number] DOB: [birth year] DOA: 10/08/2024 PCP: Vicci Barnie NOVAK, MD    Brief Narrative:   [name] is a 63 y.o. female with past medical history significant for chronic diastolic congestive heart failure, CKD stage IV, DM2, HTN, hypothyroidism, primary adrenal insufficiency, ventriculomegaly who presented to [facility] ED on 10/08/2024 with complaints of dental pain, facial edema, and progressive confusion over the last 2 days.  Family reports at baseline alert and oriented x 4, but daughter reports had difficulty getting herself dressed and more confused.  Has had ongoing issues with her teeth and was about to go see a dentist day of ED presentation; but due to her symptoms sought further care in the ED.  In the ED, temperature 98.4 F, HR 66, RR 13, BP 171/57, SpO2 95% on room air.  WBC 15.1, hemoglobin 12.6, platelet count 187.  Sodium 132, potassium 4.2, chloride 95, CO2 22, glucose 140, BUN 35, creatinine 2.19.  AST 33, ALT 20, total bilirubin 0.9.  Urinalysis unrevealing.  Chest x-ray with no acute process.  CT head without contrast with no evidence of acute intracranial abnormality, prominence of the lateral part of ventricles which is similar to prior MRI 05/13/2021.  CT soft tissue neck with facial cellulitis, no definite abscess notified.  CT maxillofacial without contrast with facial edema inflammatory stranding predominantly midline and on the right, right periorbital swelling also present, consistent with cellulitis no appreciable soft tissue abscess noted.  Patient was given IV Solu-Cortef , IV fluids, and started on IV antibiotics.  TRH consulted for admission for further evaluation management of facial cellulitis.  Assessment & Plan:   Acute metabolic encephalopathy, POA: Resolved Facial cellulitis likely secondary to dental infection/source Patient presenting with 2-day history of progressive dental pain with associated facial edema/erythema  and confusion.  Reports having issues with her teeth for some time and was to see a dentist outpatient.  Patient was afebrile with elevated WC count of 15.1.  CT head with no acute intracranial abnormality, noted prominence of ventricles similar to prior MRI.  CT soft tissue neck and CT maxillofacial notable for facial cellulitis without abscess noted. -- WBC 15.1>9.9 -- Linezolid  600 mg IV every 12 hours -- Unasyn 3 g IV every 12 hours -- Repeat CBC in a.m. -- Anticipate transition to oral linezolid , Augmentin  on discharge, likely tomorrow with plan to 10-day course  Chronic diastolic congestive heart failure HTN -- Amlodipine  10 mg PO daily -- Hydralazine  25 mg PO TID -- Imdur  100 mg p.o. daily -- Continue aspirin /statin  CKD stage IV -- Cr 2.19>1.67>1.57; stable. (Baseline 1.6-1.7)  DM2 Hemoglobin A1c 8.8, not optimally controlled.  Home regimen includes Lantus  5 units every m and NovoLog  3-4 units every morning before breakfast and every evening before dinner. -- Semglee 6 units [place] daily -- moderate SSI for coverage -- CBGs before every meal/at bedtime  Hypothyroidism -- Levothyroxine  150 mcg p.o. daily  HLD -- Crestor  10 mg p.o. daily  Primary adrenal insufficiency -- Continue Cortef  20 mg PO daily  Anxiety/depression -- Zoloft  50 mg p.o. daily  GERD -- Continue PPI  Ventriculomegaly  Chronic, stable.  CT head without contrast with no change in prominence of the lateral part of the ventricles noted on similar study MRI 05/13/2021.  DVT prophylaxis: SCDs Start: 10/08/24 2023    Code Status: Full Code Family Communication: No family present at bedside this morning  Disposition Plan:  Level of care: Progressive Status is: Inpatient Remains inpatient  appropriate because: IV antibiotics, anticipated discharge home tomorrow    Consultants:  None  Procedures:  None  Antimicrobials:  Linezolid  10/20>> Unasyn 10/20>>   Subjective: Seen examined bedside,  lying in bed.  RN present at bedside.  Dental pain improved as well as facial swelling and erythema.  Remains on IV antibiotics.  Discussed will continue IV Novox today and anticipate discharge home tomorrow.  No other questions or concerns at this time.  Denies headache, no dizziness, no chest pain, no palpitations, no shortness of breath, no abdominal pain, no fever/chills/night sweats, no nausea/vomiting/diarrhea, no focal weakness, no fatigue, no paresthesia.  No acute events overnight per nursing.  Objective: Vitals:   10/09/24 2025 10/10/24 0410 10/10/24 0800 10/10/24 1317  BP: (!) 143/49 (!) 162/57 101/77 117/77  Pulse: 78 65 67 77  Resp: 20 20    Temp: 98.7 F (37.1 C) 98.3 F (36.8 C) 98.4 F (36.9 C) 98.2 F (36.8 C)  TempSrc: Oral Oral Oral Oral  SpO2: 97% 100% 100% 100%  Weight:      Height:        Intake/Output Summary (Last 24 hours) at 10/10/2024 1322 Last data filed at 10/10/2024 0600 Gross per 24 hour  Intake 1293.8 ml  Output 920 ml  Net 373.8 ml   Filed Weights   10/08/24 1027  Weight: 47.6 kg    Examination:  Physical Exam: GEN: NAD, alert and oriented x 3, wd/wn, poor dentition HEENT: NCAT, PERRL, EOMI, sclera clear, MMM, no noted erythema/edema which is now resolved PULM: CTAB w/o wheezes/crackles, normal respiratory effort, on room air CV: RRR w/o M/G/R GI: abd soft, NTND, NABS, no R/G/M MSK: no peripheral edema, muscle strength globally intact 5/5 bilateral upper/lower extremities NEURO: No focal neurological deficit PSYCH: normal mood/affect Integumentary: Erythema to right face resolved, otherwise no other concerning rashes/lesions/wounds no exposed skin surfaces.     Data Reviewed: I have personally reviewed following labs and imaging studies  CBC: Recent Labs  Lab 10/08/24 1038 10/09/24 0345 10/10/24 0401  WBC 15.1* 7.9 9.9  NEUTROABS 11.7*  --  8.0*  HGB 12.6 9.6* 8.2*  HCT 40.5 30.2* 25.6*  MCV 91.4 87.8 89.2  PLT 187 160 145*    Basic Metabolic Panel: Recent Labs  Lab 10/08/24 1038 10/09/24 0345 10/10/24 0401  NA 132* 131* 136  K 4.2 4.3 4.0  CL 95* 97* 103  CO2 22 21* 23  GLUCOSE 140* 231* 206*  BUN 35* 33* 29*  CREATININE 2.19* 1.67* 1.57*  CALCIUM  9.5 8.7* 8.4*  MG  --  1.9 2.1  PHOS  --  4.1 1.8*   GFR: Estimated Creatinine Clearance: 27.2 mL/min (A) (by C-G formula based on SCr of 1.57 mg/dL (H)). Liver Function Tests: Recent Labs  Lab 10/08/24 1038 10/09/24 0345 10/10/24 0401  AST 33 25 25  ALT 20 15 16   ALKPHOS 93 84 68  BILITOT 0.9 0.7 0.5  PROT 8.2* 6.6 5.9*  ALBUMIN  4.2 3.6 3.4*   No results for input(s): LIPASE, AMYLASE in the last 168 hours. No results for input(s): AMMONIA in the last 168 hours. Coagulation Profile: No results for input(s): INR, PROTIME in the last 168 hours. Cardiac Enzymes: No results for input(s): CKTOTAL, CKMB, CKMBINDEX, TROPONINI in the last 168 hours. BNP (last 3 results) No results for input(s): PROBNP in the last 8760 hours. HbA1C: Recent Labs    10/09/24 0345  HGBA1C 8.8*   CBG: Recent Labs  Lab 10/09/24 1624 10/09/24 1715 10/09/24 2027  10/10/24 0736 10/10/24 1139  GLUCAP 360* 354* 278* 175* 289*   Lipid Profile: No results for input(s): CHOL, HDL, LDLCALC, TRIG, CHOLHDL, LDLDIRECT in the last 72 hours. Thyroid  Function Tests: Recent Labs    10/09/24 0345  TSH 1.830   Anemia Panel: No results for input(s): VITAMINB12, FOLATE, FERRITIN, TIBC, IRON , RETICCTPCT in the last 72 hours. Sepsis Labs: Recent Labs  Lab 10/10/24 0401  PROCALCITON 1.58  LATICACIDVEN 0.8    Recent Results (from the past 240 hours)  Blood culture (routine x 2)     Status: None (Preliminary result)   Collection Time: 10/08/24  5:10 PM   Specimen: BLOOD  Result Value Ref Range Status   Specimen Description   Final    BLOOD RIGHT ANTECUBITAL Performed at [facility], [address]  [place], [place] [address]    Special Requests   Final    BOTTLES DRAWN AEROBIC AND ANAEROBIC Blood Culture adequate volume Performed at [facility], [address], [place], [place] [address]    Culture   Final    NO GROWTH 2 DAYS Performed at [facility], [address] [place], [place] [address]    Report Status PENDING  Incomplete  Blood culture (routine x 2)     Status: None (Preliminary result)   Collection Time: 10/08/24  7:31 PM   Specimen: BLOOD RIGHT HAND  Result Value Ref Range Status   Specimen Description   Final    BLOOD RIGHT HAND Performed at [facility], [address], [place], [place] [address]    Special Requests   Final    BOTTLES DRAWN AEROBIC AND ANAEROBIC Blood Culture adequate volume Performed at [facility], [address], [place], [place] [address]    Culture   Final    NO GROWTH 2 DAYS Performed at [facility], [address] [place], [place] [address]    Report Status PENDING  Incomplete         Radiology Studies: No results found.      Scheduled Meds:  amLODipine   10 mg Oral Daily   aspirin   81 mg Oral Daily   cyanocobalamin   1,000 mcg Oral Daily   feeding supplement  237 mL Oral BID BM   fluticasone   1 spray Each Nare Daily   hydrALAZINE   25 mg Oral TID   hydrocortisone   20 mg Oral Daily   insulin  aspart  0-15 Units Subcutaneous TID WC   insulin  aspart  0-5 Units Subcutaneous QHS   insulin  glargine-yfgn  6 Units Subcutaneous QHS   isosorbide  mononitrate  120 mg Oral Daily   levothyroxine   150 mcg Oral Q0600   multivitamin with minerals  1 tablet Oral Daily   pantoprazole   40 mg Oral Daily   rosuvastatin   10 mg Oral Daily   senna  1 tablet Oral BID   sertraline   50 mg Oral Daily   Continuous Infusions:  ampicillin-sulbactam (UNASYN) IV Stopped (10/10/24 0447)   linezolid  (ZYVOX ) IV 600 mg (10/10/24 0928)     LOS: 2 days    Time spent: 51 minutes spent on  10/10/2024 caring for this patient face-to-face including chart review, ordering labs/tests, documenting, discussion with nursing staff, consultants, updating family and interview/physical exam    Camellia PARAS [place], DO [facility] Available via Epic secure chat 7am-7pm After these hours, please refer to coverage provider listed on [email or web address] 10/10/2024, 1:22 PM

## 2024-10-11 ENCOUNTER — Other Ambulatory Visit (HOSPITAL_COMMUNITY): Payer: Self-pay

## 2024-10-11 ENCOUNTER — Other Ambulatory Visit (HOSPITAL_BASED_OUTPATIENT_CLINIC_OR_DEPARTMENT_OTHER): Payer: Self-pay

## 2024-10-11 ENCOUNTER — Other Ambulatory Visit: Payer: Self-pay

## 2024-10-11 DIAGNOSIS — L03211 Cellulitis of face: Secondary | ICD-10-CM | POA: Diagnosis not present

## 2024-10-11 LAB — BASIC METABOLIC PANEL WITH GFR
Anion gap: 9 (ref 5–15)
BUN: 19 mg/dL (ref 8–23)
CO2: 24 mmol/L (ref 22–32)
Calcium: 8.4 mg/dL — ABNORMAL LOW (ref 8.9–10.3)
Chloride: 101 mmol/L (ref 98–111)
Creatinine, Ser: 1.5 mg/dL — ABNORMAL HIGH (ref 0.44–1.00)
GFR, Estimated: 38 mL/min — ABNORMAL LOW (ref >60)
Glucose, Bld: 208 mg/dL — ABNORMAL HIGH (ref 70–99)
Potassium: 3.6 mmol/L (ref 3.5–5.1)
Sodium: 134 mmol/L — ABNORMAL LOW (ref 135–145)

## 2024-10-11 LAB — CBC
HCT: 25.6 % — ABNORMAL LOW (ref 36.0–46.0)
Hemoglobin: 8.3 g/dL — ABNORMAL LOW (ref 12.0–15.0)
MCH: 29.3 pg (ref 26.0–34.0)
MCHC: 32.4 g/dL (ref 30.0–36.0)
MCV: 90.5 fL (ref 80.0–100.0)
Platelets: 124 K/uL — ABNORMAL LOW (ref 150–400)
RBC: 2.83 MIL/uL — ABNORMAL LOW (ref 3.87–5.11)
RDW: 12.9 % (ref 11.5–15.5)
WBC: 8.2 K/uL (ref 4.0–10.5)
nRBC: 0 % (ref 0.0–0.2)

## 2024-10-11 LAB — PHOSPHORUS: Phosphorus: 2 mg/dL — ABNORMAL LOW (ref 2.5–4.6)

## 2024-10-11 LAB — GLUCOSE, CAPILLARY
Glucose-Capillary: 137 mg/dL — ABNORMAL HIGH (ref 70–99)
Glucose-Capillary: 192 mg/dL — ABNORMAL HIGH (ref 70–99)

## 2024-10-11 MED ORDER — AMOXICILLIN-POT CLAVULANATE 500-125 MG PO TABS
1.0000 | ORAL_TABLET | Freq: Two times a day (BID) | ORAL | 0 refills | Status: AC
  Filled 2024-10-11: qty 14, 7d supply, fill #0

## 2024-10-11 MED ORDER — LINEZOLID 600 MG PO TABS
600.0000 mg | ORAL_TABLET | Freq: Two times a day (BID) | ORAL | 0 refills | Status: AC
  Filled 2024-10-11: qty 14, 7d supply, fill #0

## 2024-10-11 MED ORDER — SODIUM PHOSPHATES 45 MMOLE/15ML IV SOLN
15.0000 mmol | Freq: Once | INTRAVENOUS | Status: AC
  Administered 2024-10-11: 15 mmol via INTRAVENOUS
  Filled 2024-10-11: qty 5

## 2024-10-11 MED FILL — Levothyroxine Sodium Tab 150 MCG: ORAL | 90 days supply | Qty: 90 | Fill #0 | Status: CN

## 2024-10-11 NOTE — Plan of Care (Signed)
  Problem: Education: Goal: Knowledge of General Education information will improve Description: Including pain rating scale, medication(s)/side effects and non-pharmacologic comfort measures Outcome: Adequate for Discharge   Problem: Health Behavior/Discharge Planning: Goal: Ability to manage health-related needs will improve Outcome: Adequate for Discharge   Problem: Clinical Measurements: Goal: Ability to maintain clinical measurements within normal limits will improve Outcome: Adequate for Discharge Goal: Will remain free from infection Outcome: Adequate for Discharge Goal: Diagnostic test results will improve Outcome: Adequate for Discharge Goal: Respiratory complications will improve Outcome: Adequate for Discharge Goal: Cardiovascular complication will be avoided Outcome: Adequate for Discharge   Problem: Activity: Goal: Risk for activity intolerance will decrease Outcome: Adequate for Discharge   Problem: Nutrition: Goal: Adequate nutrition will be maintained Outcome: Adequate for Discharge   Problem: Coping: Goal: Level of anxiety will decrease Outcome: Adequate for Discharge   Problem: Elimination: Goal: Will not experience complications related to bowel motility Outcome: Adequate for Discharge Goal: Will not experience complications related to urinary retention Outcome: Adequate for Discharge   Problem: Pain Managment: Goal: General experience of comfort will improve and/or be controlled Outcome: Adequate for Discharge   Problem: Safety: Goal: Ability to remain free from injury will improve Outcome: Adequate for Discharge   Problem: Skin Integrity: Goal: Risk for impaired skin integrity will decrease Outcome: Adequate for Discharge   Problem: Clinical Measurements: Goal: Ability to avoid or minimize complications of infection will improve Outcome: Adequate for Discharge   Problem: Skin Integrity: Goal: Skin integrity will improve Outcome: Adequate  for Discharge   Problem: Education: Goal: Ability to describe self-care measures that may prevent or decrease complications (Diabetes Survival Skills Education) will improve Outcome: Adequate for Discharge Goal: Individualized Educational Video(s) Outcome: Adequate for Discharge   Problem: Coping: Goal: Ability to adjust to condition or change in health will improve Outcome: Adequate for Discharge   Problem: Fluid Volume: Goal: Ability to maintain a balanced intake and output will improve Outcome: Adequate for Discharge   Problem: Health Behavior/Discharge Planning: Goal: Ability to identify and utilize available resources and services will improve Outcome: Adequate for Discharge Goal: Ability to manage health-related needs will improve Outcome: Adequate for Discharge   Problem: Metabolic: Goal: Ability to maintain appropriate glucose levels will improve Outcome: Adequate for Discharge   Problem: Nutritional: Goal: Maintenance of adequate nutrition will improve Outcome: Adequate for Discharge Goal: Progress toward achieving an optimal weight will improve Outcome: Adequate for Discharge   Problem: Skin Integrity: Goal: Risk for impaired skin integrity will decrease Outcome: Adequate for Discharge   Problem: Tissue Perfusion: Goal: Adequacy of tissue perfusion will improve Outcome: Adequate for Discharge

## 2024-10-11 NOTE — Discharge Summary (Signed)
 Physician Discharge Summary  [name]:[id number] DOB: Feb 02, 1961 DOA: 10/08/2024  PCP: Vicci Barnie NOVAK, MD  Admit date: 10/08/2024 Discharge date: 10/11/2024  Admitted From: Home Disposition: Home  Recommendations for Outpatient Follow-up:  Follow up with PCP in 1-2 weeks Follow-up with dentist within 1 week as likely cause of facial cellulitis related to dental infection Continue linezolid  and Augmentin  to complete treatment course for facial cellulitis  Home Health: No Equipment/Devices: None  Discharge Condition: Stable CODE STATUS: Full code Diet recommendation: Heart healthy/consistent carbohydrate diet  History of present illness:  [name] is a 63 y.o. female with past medical history significant for chronic diastolic congestive heart failure, CKD stage IV, DM2, HTN, hypothyroidism, primary adrenal insufficiency, ventriculomegaly who presented to [facility] ED on 10/08/2024 with complaints of dental pain, facial edema, and progressive confusion over the last 2 days.  Family reports at baseline alert and oriented x 4, but daughter reports had difficulty getting herself dressed and more confused.  Has had ongoing issues with her teeth and was about to go see a dentist day of ED presentation; but due to her symptoms sought further care in the ED.   In the ED, temperature 98.4 F, HR 66, RR 13, BP 171/57, SpO2 95% on room air.  WBC 15.1, hemoglobin 12.6, platelet count 187.  Sodium 132, potassium 4.2, chloride 95, CO2 22, glucose 140, BUN 35, creatinine 2.19.  AST 33, ALT 20, total bilirubin 0.9.  Urinalysis unrevealing.  Chest x-ray with no acute process.  CT head without contrast with no evidence of acute intracranial abnormality, prominence of the lateral part of ventricles which is similar to prior MRI 05/13/2021.  CT soft tissue neck with facial cellulitis, no definite abscess notified.  CT maxillofacial without contrast with facial edema inflammatory stranding  predominantly midline and on the right, right periorbital swelling also present, consistent with cellulitis no appreciable soft tissue abscess noted.  Patient was given IV Solu-Cortef , IV fluids, and started on IV antibiotics.  TRH consulted for admission for further evaluation management of facial cellulitis.  Hospital course:  Acute metabolic encephalopathy, POA: Resolved Facial cellulitis likely secondary to dental infection/source Patient presenting with 2-day history of progressive dental pain with associated facial edema/erythema and confusion.  Reports having issues with her teeth for some time and was to see a dentist outpatient.  Patient was afebrile with elevated WC count of 15.1.  CT head with no acute intracranial abnormality, noted prominence of ventricles similar to prior MRI.  CT soft tissue neck and CT maxillofacial notable for facial cellulitis without abscess noted.  Patient was started on linezolid  and Unasyn with improvement of symptoms.  WBC count improved to 8.2 at time of discharge.  Will transition Unasyn to Augmentin  and continue linezolid  on discharge to complete 10-day antibiotic course.  Patient needs close follow-up with her dentist for evaluation as likely dental infection causing facial cellulitis.   Chronic diastolic congestive heart failure HTN Amlodipine  10 mg PO daily, Hydralazine  25 mg PO TID, Imdur  100 mg p.o. daily, torsemide  as needed.  Continue aspirin  and statin.   CKD stage IV Baseline 1.6-1.7; creatinine 1.50 time of discharge.   DM2 Hemoglobin A1c 8.8, not optimally controlled.  Home regimen includes Lantus  5 units every m and NovoLog  3-4 units every morning before breakfast and every evening before dinner.   Hypothyroidism Levothyroxine  150 mcg p.o. daily   HLD Crestor  10 mg p.o. daily   Primary adrenal insufficiency Continue Cortef  20 mg PO daily   Anxiety/depression  Zoloft  50 mg p.o. daily   GERD Continue PPI   Ventriculomegaly   Chronic, stable.  CT head without contrast with no change in prominence of the lateral part of the ventricles noted on similar study MRI 05/13/2021.  Discharge Diagnoses:  Principal Problem:   Facial cellulitis Active Problems:   Protein-calorie malnutrition, moderate    Discharge Instructions  Discharge Instructions     Call MD for:  difficulty breathing, headache or visual disturbances   Complete by: As directed    Call MD for:  extreme fatigue   Complete by: As directed    Call MD for:  persistant dizziness or light-headedness   Complete by: As directed    Call MD for:  persistant nausea and vomiting   Complete by: As directed    Call MD for:  severe uncontrolled pain   Complete by: As directed    Call MD for:  temperature >100.4   Complete by: As directed    Diet - low sodium heart healthy   Complete by: As directed    Increase activity slowly   Complete by: As directed       Allergies as of 10/11/2024       Reactions   Bovine (beef) Protein-containing Drug Products Other (See Comments)   RELIGOUS  REASONS   Porcine (pork) Protein-containing Drug Products Other (See Comments)   RELIGIOUS REASONS   Percocet [oxycodone -acetaminophen ] Rash        Medication List     TAKE these medications    Accu-Chek Softclix Lancets lancets Use as instructed   amLODipine  10 MG tablet Commonly known as: NORVASC  Take 1 tablet (10 mg total) by mouth daily.   amoxicillin -clavulanate 500-125 MG tablet Commonly known as: Augmentin  Take 1 tablet by mouth every 12 (twelve) hours for 7 days.   aspirin  81 MG chewable tablet Chew 81 mg by mouth daily.   Baqsimi  Two Pack 3 MG/DOSE Powd Generic drug: Glucagon  Place 3 mg into the nose once as needed for up to 1 dose.   CALCIUM  PO Take 1,200 mg by mouth in the morning.   cyanocobalamin  1000 MCG tablet Commonly known as: VITAMIN B12 Take 1 tablet (1,000 mcg total) by mouth daily.   fluticasone  50 MCG/ACT nasal  spray Commonly known as: FLONASE  Place 1 spray into both nostrils daily for 1 to 2 weeks then as needed thereafer. What changed:  when to take this reasons to take this   FreeStyle Libre 3 Plus Sensor Misc Change sensor every 15 days.   hydrALAZINE  25 MG tablet Commonly known as: APRESOLINE  Take 1 tablet (25 mg total) by mouth 3 (three) times daily.   hydrocortisone  10 MG tablet Commonly known as: CORTEF  Take 2-3 tablets (20-30 mg total) by mouth daily. What changed:  how much to take when to take this additional instructions   isosorbide  mononitrate 120 MG 24 hr tablet Commonly known as: IMDUR  Take 1 tablet (120 mg total) by mouth daily.   Lantus  SoloStar 100 UNIT/ML Solostar Pen Generic drug: insulin  glargine Inject 6 Units into the skin at bedtime. What changed:  how much to take when to take this   levothyroxine  150 MCG tablet Commonly known as: SYNTHROID  TAKE 1 TABLET BY MOUTH EVERY DAY BEFORE BREAKFAST What changed: See the new instructions.   linezolid  600 MG tablet Commonly known as: ZYVOX  Take 1 tablet (600 mg total) by mouth 2 (two) times daily for 7 days.   multivitamin with minerals Tabs tablet Take 1 tablet by  mouth daily.   NovoLOG  FlexPen 100 UNIT/ML FlexPen Generic drug: insulin  aspart 2 units subcut before meals.  Hold if BS before meal less than 180 What changed:  how much to take how to take this when to take this additional instructions   omeprazole  20 MG capsule Commonly known as: PRILOSEC Take 1 capsule (20 mg total) by mouth daily. What changed: when to take this   ondansetron  4 MG disintegrating tablet Commonly known as: ZOFRAN -ODT Take 1 tablet (4 mg total) by mouth 2 (two) times daily as needed for nausea or vomiting. What changed: reasons to take this   Pen Needles 31G X 8 MM Misc UAD   rosuvastatin  10 MG tablet Commonly known as: Crestor  Take 1 tablet (10 mg total) by mouth daily. Stop Zetia    sertraline  50 MG  tablet Commonly known as: ZOLOFT  Take 1 tablet (50 mg total) by mouth daily.   torsemide  20 MG tablet Commonly known as: DEMADEX  Take 1 tablet (20 mg total) by mouth daily as needed (for weight gain of 3lbs in 1 day or 5 lbs in 2 days)   traMADol  50 MG tablet Commonly known as: ULTRAM  Take 1 tablet (50 mg total) by mouth 2 (two) times daily as needed. What changed: reasons to take this   Ventolin  HFA 108 (90 Base) MCG/ACT inhaler Generic drug: albuterol  Inhale 1-2 puffs into the lungs every 6 (six) hours as needed for wheezing or shortness of breath. What changed: how much to take        Follow-up Information     Vicci Barnie NOVAK, MD. Schedule an appointment as soon as possible for a visit in 1 week(s).   Specialty: Internal Medicine Contact information: [address] [place] [address] [phone]         Dentist Follow up.   Why: Please follow-up with your dentist within the next week as likely dental cause of facial cellulitis               Allergies  Allergen Reactions   Bovine (Beef) Protein-Containing Drug Products Other (See Comments)    RELIGOUS  REASONS   Porcine (Pork) Protein-Containing Drug Products Other (See Comments)    RELIGIOUS REASONS   Percocet [Oxycodone -Acetaminophen ] Rash    Consultations: None   Procedures/Studies: CT Soft Tissue Neck Wo Contrast Result Date: 10/08/2024 CLINICAL DATA:  Right facial edema, tooth pain, swelling and confusion EXAM: CT NECK WITHOUT CONTRAST TECHNIQUE: Multidetector CT imaging of the neck was performed following the standard protocol without intravenous contrast. RADIATION DOSE REDUCTION: This exam was performed according to the departmental dose-optimization program which includes automated exposure control, adjustment of the mA and/or kV according to patient size and/or use of iterative reconstruction technique. COMPARISON:  None Available. FINDINGS: Pharynx and larynx: The  nasopharynx, oropharynx and hypopharynx are normal. The larynx is normal. The floor of the mouth appears normal. Salivary glands: The submandibular glands and parotid glands are normal. Thyroid : Normal Lymph nodes: No adenopathy identified. Limited intracranial: No significant abnormality Visualized orbits: Normal Mastoids and visualized paranasal sinuses: No significant abnormality Skeleton: No significant abnormality Upper chest: No significant abnormality Other: There is stranding in the right facial soft tissues. No definite abscess identified. IMPRESSION: Facial cellulitis.  No definite abscess identified. Electronically Signed   By: Nancyann Burns M.D.   On: 10/08/2024 16:26   CT Maxillofacial Wo Contrast Result Date: 10/08/2024 CLINICAL DATA:  Provided history: Deep space facial infection. GFR < 30. EXAM: CT MAXILLOFACIAL WITHOUT  CONTRAST TECHNIQUE: Multidetector CT imaging of the maxillofacial structures was performed. Multiplanar CT image reconstructions were also generated. RADIATION DOSE REDUCTION: This exam was performed according to the departmental dose-optimization program which includes automated exposure control, adjustment of the mA and/or kV according to patient size and/or use of iterative reconstruction technique. COMPARISON:  None. FINDINGS: Osseous: Periapical lucency (consistent with periodontal disease) surrounding the left mandibular medial and lateral incisor teeth, right mandibular medial incisor tooth, right maxillary first premolar tooth, right maxillary canine tooth, right maxillary lateral and medial incisor teeth, left maxillary medial incisor tooth, left maxillary canine tooth and left maxillary first premolar tooth. Background poor dentition with multiple absent teeth. No maxillofacial fracture is identified. Orbits: Right periorbital soft tissue swelling. No acute finding within the orbits. Sinuses: Mild-to-moderate mucosal thickening within the right maxillary sinus. Trace  mucosal thickening within the left maxillary sinus. Minimal mucosal thickening scattered within bilateral ethmoid air cells. Soft tissues: Facial edema and inflammatory stranding predominantly at midline and on the right. No evidence of a soft tissue abscess on this non-contrast examination. Limited intracranial: Intracranial findings separately reported on same day head CT. IMPRESSION: 1. Facial edema and inflammatory stranding predominantly at midline and on the right. Right periorbital swelling also present. Findings are compatible with cellulitis in the appropriate clinical setting. Consider an odontogenic infection given the provided history of tooth pain and given the multifocal periodontal disease described within the body of the report. No appreciable soft tissue abscess on this non-contrast examination. 2. Mucosal thickening within the paranasal sinuses, as described. Electronically Signed   By: Rockey Childs D.O.   On: 10/08/2024 14:46   CT Head Wo Contrast Result Date: 10/08/2024 CLINICAL DATA:  Provided history: Mental status change, unknown cause. Possible trauma. EXAM: CT HEAD WITHOUT CONTRAST TECHNIQUE: Contiguous axial images were obtained from the base of the skull through the vertex without intravenous contrast. RADIATION DOSE REDUCTION: This exam was performed according to the departmental dose-optimization program which includes automated exposure control, adjustment of the mA and/or kV according to patient size and/or use of iterative reconstruction technique. COMPARISON:  Brain MRI 04/23/2021. FINDINGS: Brain: Prominence of the lateral and third ventricles, similar to the prior MRI of 05/13/2021. This could be secondary to central predominant atrophy. However, there is also an acute callosal angle and some crowding of the sulci at the level of the high frontoparietal lobes, and these findings can be seen in setting of normal pressure hydrocephalus (NPH). There is no acute intracranial  hemorrhage. No demarcated cortical infarct. No extra-axial fluid collection. No evidence of an intracranial mass. No midline shift. Vascular: No hyperdense vessel. Atherosclerotic calcifications. Skull: No calvarial fracture or aggressive osseous lesion. Sinuses/Orbits: Please refer to the same day maxillofacial CT for description of maxillofacial findings. IMPRESSION: 1. No evidence of an acute intracranial abnormality. 2. Prominence of the lateral and third ventricles which is similar to the prior MRI of 05/13/2021. This could reflect central predominant atrophy. However, there are also imaging findings which can be seen in setting of normal pressure hydrocephalus and NPH is possible in the appropriate clinical setting. Electronically Signed   By: Rockey Childs D.O.   On: 10/08/2024 14:08   DG Chest Portable 1 View Result Date: 10/08/2024 EXAM: 1 VIEW XRAY OF THE CHEST 10/08/2024 11:17:00 AM COMPARISON: 10/08/23. CLINICAL HISTORY: AMS, ?trauma. Pt family states facial swelling started started two days ago and gradually got worse. Pt is also having headache and confusion. FINDINGS: LUNGS AND PLEURA: No focal pulmonary opacity.  No pulmonary edema. No pleural effusion. No pneumothorax. HEART AND MEDIASTINUM: No acute abnormality of the cardiac and mediastinal silhouettes. BONES AND SOFT TISSUES: No acute osseous abnormality. IMPRESSION: 1. No acute process. Electronically signed by: Waddell Calk MD 10/08/2024 12:14 PM EDT RP Workstation: [id number]     Subjective: Patient seen examined bedside, lying in bed.  No complaints this morning.  Symptoms have resolved with no further erythema, no swelling to her right face.  Ready for discharge home.  Discussed with patient needs close follow-up with her dentist as likely cause of her facial cellulitis related to dental infection.  No other questions or concerns at this time.  Denies headache, no dizziness, no chest pain, no palpitations, no shortness of breath, no  abdominal pain, no fever/chills/night sweats, no nausea/vomiting/diarrhea, no focal weakness, no fatigue, no paresthesias.  No acute events overnight per nursing.  Discharge Exam: Vitals:   10/10/24 2110 10/11/24 0328  BP: (!) 156/59 (!) 158/58  Pulse: 78 63  Resp: 17 18  Temp: 98.6 F (37 C) 99 F (37.2 C)  SpO2: 98% 98%   Vitals:   10/10/24 0800 10/10/24 1317 10/10/24 2110 10/11/24 0328  BP: 101/77 117/77 (!) 156/59 (!) 158/58  Pulse: 67 77 78 63  Resp:   17 18  Temp: 98.4 F (36.9 C) 98.2 F (36.8 C) 98.6 F (37 C) 99 F (37.2 C)  TempSrc: Oral Oral Oral Oral  SpO2: 100% 100% 98% 98%  Weight:      Height:        Physical Exam: GEN: NAD, alert and oriented x 3, wd/wn, poor dentition HEENT: NCAT, PERRL, EOMI, sclera clear, MMM, no noted erythema/edema which is now resolved PULM: CTAB w/o wheezes/crackles, normal respiratory effort, on room air CV: RRR w/o M/G/R GI: abd soft, NTND, NABS, no R/G/M MSK: no peripheral edema, muscle strength globally intact 5/5 bilateral upper/lower extremities NEURO: No focal neurological deficit PSYCH: normal mood/affect Integumentary: Erythema to right face resolved, otherwise no other concerning rashes/lesions/wounds no exposed skin surfaces.     The results of significant diagnostics from this hospitalization (including imaging, microbiology, ancillary and laboratory) are listed below for reference.     Microbiology: Recent Results (from the past 240 hours)  Blood culture (routine x 2)     Status: None (Preliminary result)   Collection Time: 10/08/24  5:10 PM   Specimen: BLOOD  Result Value Ref Range Status   Specimen Description   Final    BLOOD RIGHT ANTECUBITAL Performed at [facility], [address] [place], [place] [address]    Special Requests   Final    BOTTLES DRAWN AEROBIC AND ANAEROBIC Blood Culture adequate volume Performed at [facility], [address], [place],  [place] [address]    Culture   Final    NO GROWTH 3 DAYS Performed at [facility], [address] [place], [place] [address]    Report Status PENDING  Incomplete  Blood culture (routine x 2)     Status: None (Preliminary result)   Collection Time: 10/08/24  7:31 PM   Specimen: BLOOD RIGHT HAND  Result Value Ref Range Status   Specimen Description   Final    BLOOD RIGHT HAND Performed at [facility], [address], [place], [place] [address]    Special Requests   Final    BOTTLES DRAWN AEROBIC AND ANAEROBIC Blood Culture adequate volume Performed at [facility], [address] [place],  [place] [address]    Culture   Final    NO GROWTH 3 DAYS Performed at [facility], [address], [place], [place] [address]    Report Status PENDING  Incomplete     Labs: BNP (last 3 results) No results for input(s): BNP in the last 8760 hours. Basic Metabolic Panel: Recent Labs  Lab 10/08/24 1038 10/09/24 0345 10/10/24 0401 10/11/24 0352  NA 132* 131* 136 134*  K 4.2 4.3 4.0 3.6  CL 95* 97* 103 101  CO2 22 21* 23 24  GLUCOSE 140* 231* 206* 208*  BUN 35* 33* 29* 19  CREATININE 2.19* 1.67* 1.57* 1.50*  CALCIUM  9.5 8.7* 8.4* 8.4*  MG  --  1.9 2.1  --   PHOS  --  4.1 1.8* 2.0*   Liver Function Tests: Recent Labs  Lab 10/08/24 1038 10/09/24 0345 10/10/24 0401  AST 33 25 25  ALT 20 15 16   ALKPHOS 93 84 68  BILITOT 0.9 0.7 0.5  PROT 8.2* 6.6 5.9*  ALBUMIN  4.2 3.6 3.4*   No results for input(s): LIPASE, AMYLASE in the last 168 hours. No results for input(s): AMMONIA in the last 168 hours. CBC: Recent Labs  Lab 10/08/24 1038 10/09/24 0345 10/10/24 0401 10/11/24 0352  WBC 15.1* 7.9 9.9 8.2  NEUTROABS 11.7*  --  8.0*  --   HGB 12.6 9.6* 8.2* 8.3*  HCT 40.5 30.2* 25.6* 25.6*  MCV 91.4 87.8 89.2 90.5  PLT 187 160 145* 124*   Cardiac Enzymes: No results for input(s): CKTOTAL, CKMB, CKMBINDEX, TROPONINI in the last 168  hours. BNP: Invalid input(s): POCBNP CBG: Recent Labs  Lab 10/10/24 0736 10/10/24 1139 10/10/24 1646 10/10/24 2108 10/11/24 0736  GLUCAP 175* 289* 196* 185* 137*   D-Dimer No results for input(s): DDIMER in the last 72 hours. Hgb A1c Recent Labs    10/09/24 0345  HGBA1C 8.8*   Lipid Profile No results for input(s): CHOL, HDL, LDLCALC, TRIG, CHOLHDL, LDLDIRECT in the last 72 hours. Thyroid  function studies Recent Labs    10/09/24 0345  TSH 1.830   Anemia work up No results for input(s): VITAMINB12, FOLATE, FERRITIN, TIBC, IRON , RETICCTPCT in the last 72 hours. Urinalysis    Component Value Date/Time   COLORURINE YELLOW 10/08/2024 1245   APPEARANCEUR CLEAR 10/08/2024 1245   LABSPEC 1.014 10/08/2024 1245   PHURINE 5.0 10/08/2024 1245   GLUCOSEU NEGATIVE 10/08/2024 1245   HGBUR SMALL (A) 10/08/2024 1245   BILIRUBINUR NEGATIVE 10/08/2024 1245   KETONESUR NEGATIVE 10/08/2024 1245   PROTEINUR NEGATIVE 10/08/2024 1245   UROBILINOGEN 0.2 06/12/2011 1904   NITRITE NEGATIVE 10/08/2024 1245   LEUKOCYTESUR NEGATIVE 10/08/2024 1245   Sepsis Labs Recent Labs  Lab 10/08/24 1038 10/09/24 0345 10/10/24 0401 10/11/24 0352  WBC 15.1* 7.9 9.9 8.2   Microbiology Recent Results (from the past 240 hours)  Blood culture (routine x 2)     Status: None (Preliminary result)   Collection Time: 10/08/24  5:10 PM   Specimen: BLOOD  Result Value Ref Range Status   Specimen Description   Final    BLOOD RIGHT ANTECUBITAL Performed at [facility], [address] [place], [place] [address]    Special Requests   Final    BOTTLES DRAWN AEROBIC AND ANAEROBIC Blood Culture adequate volume Performed at [facility], [address], [place], [place] [address]    Culture   Final    NO GROWTH 3 DAYS Performed at [facility], [address], [place], [place] [address]    Report Status PENDING  Incomplete  Blood  culture (routine x 2)     Status: None (Preliminary result)   Collection Time: 10/08/24  7:31 PM   Specimen: BLOOD RIGHT HAND  Result Value Ref Range Status   Specimen Description   Final    BLOOD RIGHT HAND Performed at [facility], [address], [place], [place] [address]    Special Requests   Final    BOTTLES DRAWN AEROBIC AND ANAEROBIC Blood Culture adequate volume Performed at [facility], [address], [place], [place] [address]    Culture   Final    NO GROWTH 3 DAYS Performed at [facility], [address] [place], [place] [address]    Report Status PENDING  Incomplete     Time coordinating discharge: Over 30 minutes  SIGNED:   Camellia PARAS [place], DO  [facility] 10/11/2024, 11:13 AM

## 2024-10-12 ENCOUNTER — Other Ambulatory Visit: Payer: Self-pay

## 2024-10-12 ENCOUNTER — Telehealth: Payer: Self-pay | Admitting: *Deleted

## 2024-10-12 ENCOUNTER — Other Ambulatory Visit (HOSPITAL_BASED_OUTPATIENT_CLINIC_OR_DEPARTMENT_OTHER): Payer: Self-pay

## 2024-10-12 MED FILL — Levothyroxine Sodium Tab 150 MCG: ORAL | 90 days supply | Qty: 90 | Fill #0 | Status: CN

## 2024-10-12 NOTE — Transitions of Care (Post Inpatient/ED Visit) (Signed)
   10/12/2024  Name: [name] MRN: [id number] DOB: [birth year]  Today's TOC FU Call Status: Today's TOC FU Call Status:: Successful TOC FU Call Completed TOC FU Call Complete Date: 10/12/24 Patient's Name and Date of Birth confirmed.  Transition Care Management Follow-up Telephone Call Date of Discharge: 10/11/24 Discharge Facility: [facility]) Type of Discharge: Inpatient Admission Primary Inpatient Discharge Diagnosis:: Facial cellulitis How have you been since you were released from the hospital?: Better Any questions or concerns?: No  Items Reviewed: Do you have support at home?: Yes People in Home [RPT]: child(ren), adult Name of Support/Comfort Primary Source: Daughters [name] and Iona  Medications Reviewed Today:Unable to complete Medications Reviewed Today   Medications were not reviewed in this encounter     Home Care and Equipment/Supplies:    Functional Questionnaire:    Follow up appointments reviewed: PCP Follow-up appointment confirmed?: Yes Date of PCP follow-up appointment?: 10/15/24 Follow-up Provider: Dr. Vicci  RNCM spoke with patient's DPR/Dtr/Iona. DPR reports that she and her sister manage her mothers care. DPR was unable to complete TOC assessment and medication review due to being out of town and her sister is at work. DPR was aware of PCP appointment on 10/15/24 and has plans for dental care.  Andrea Dimes RN, BSN [place]  [facility] RN Care Manager [phone]

## 2024-10-13 LAB — CULTURE, BLOOD (ROUTINE X 2)
Culture: NO GROWTH
Culture: NO GROWTH
Special Requests: ADEQUATE
Special Requests: ADEQUATE

## 2024-10-15 ENCOUNTER — Ambulatory Visit: Payer: Self-pay | Attending: Internal Medicine | Admitting: Internal Medicine

## 2024-10-15 ENCOUNTER — Other Ambulatory Visit (HOSPITAL_BASED_OUTPATIENT_CLINIC_OR_DEPARTMENT_OTHER): Payer: Self-pay

## 2024-10-15 ENCOUNTER — Encounter: Payer: Self-pay | Admitting: Internal Medicine

## 2024-10-15 ENCOUNTER — Other Ambulatory Visit (HOSPITAL_COMMUNITY): Payer: Self-pay

## 2024-10-15 ENCOUNTER — Other Ambulatory Visit: Payer: Self-pay

## 2024-10-15 VITALS — BP 169/61 | HR 76 | Temp 98.6°F | Ht 64.0 in | Wt 114.0 lb

## 2024-10-15 DIAGNOSIS — E039 Hypothyroidism, unspecified: Secondary | ICD-10-CM

## 2024-10-15 DIAGNOSIS — I1310 Hypertensive heart and chronic kidney disease without heart failure, with stage 1 through stage 4 chronic kidney disease, or unspecified chronic kidney disease: Secondary | ICD-10-CM | POA: Diagnosis not present

## 2024-10-15 DIAGNOSIS — F330 Major depressive disorder, recurrent, mild: Secondary | ICD-10-CM

## 2024-10-15 DIAGNOSIS — E271 Primary adrenocortical insufficiency: Secondary | ICD-10-CM

## 2024-10-15 DIAGNOSIS — E1165 Type 2 diabetes mellitus with hyperglycemia: Secondary | ICD-10-CM

## 2024-10-15 DIAGNOSIS — N1832 Chronic kidney disease, stage 3b: Secondary | ICD-10-CM

## 2024-10-15 DIAGNOSIS — I129 Hypertensive chronic kidney disease with stage 1 through stage 4 chronic kidney disease, or unspecified chronic kidney disease: Secondary | ICD-10-CM

## 2024-10-15 DIAGNOSIS — Z794 Long term (current) use of insulin: Secondary | ICD-10-CM

## 2024-10-15 DIAGNOSIS — Z1211 Encounter for screening for malignant neoplasm of colon: Secondary | ICD-10-CM

## 2024-10-15 DIAGNOSIS — E2740 Unspecified adrenocortical insufficiency: Secondary | ICD-10-CM

## 2024-10-15 DIAGNOSIS — Z09 Encounter for follow-up examination after completed treatment for conditions other than malignant neoplasm: Secondary | ICD-10-CM | POA: Diagnosis not present

## 2024-10-15 DIAGNOSIS — M170 Bilateral primary osteoarthritis of knee: Secondary | ICD-10-CM

## 2024-10-15 MED ORDER — AMLODIPINE BESYLATE 10 MG PO TABS
10.0000 mg | ORAL_TABLET | Freq: Every day | ORAL | 1 refills | Status: AC
  Filled 2024-10-15 – 2025-01-09 (×3): qty 90, 90d supply, fill #0
  Filled 2025-01-10 (×2): qty 30, 30d supply, fill #0

## 2024-10-15 MED ORDER — NOVOLOG FLEXPEN 100 UNIT/ML ~~LOC~~ SOPN
PEN_INJECTOR | SUBCUTANEOUS | 2 refills | Status: AC
  Filled 2024-10-15: qty 15, fill #0
  Filled 2025-01-09 (×2): qty 6, 75d supply, fill #0

## 2024-10-15 MED ORDER — LANTUS SOLOSTAR 100 UNIT/ML ~~LOC~~ SOPN
5.0000 [IU] | PEN_INJECTOR | Freq: Every day | SUBCUTANEOUS | 1 refills | Status: AC
  Filled 2024-10-15: qty 15, 300d supply, fill #0
  Filled 2025-01-09 (×2): qty 9, 84d supply, fill #0

## 2024-10-15 MED ORDER — TORSEMIDE 20 MG PO TABS
20.0000 mg | ORAL_TABLET | Freq: Every day | ORAL | 1 refills | Status: AC | PRN
  Filled 2024-10-15: qty 90, 90d supply, fill #0

## 2024-10-15 MED ORDER — PEN NEEDLES 31G X 8 MM MISC
6 refills | Status: AC
  Filled 2024-10-15: qty 100, fill #0
  Filled 2025-01-09 (×2): qty 100, 90d supply, fill #0

## 2024-10-15 MED ORDER — TRAMADOL HCL 50 MG PO TABS
50.0000 mg | ORAL_TABLET | Freq: Two times a day (BID) | ORAL | 1 refills | Status: AC | PRN
  Filled 2024-10-15: qty 60, 30d supply, fill #0
  Filled 2025-01-13: qty 60, 30d supply, fill #1
  Filled 2025-01-14: qty 14, 7d supply, fill #1
  Filled 2025-01-14 – 2025-01-18 (×2): qty 60, 30d supply, fill #0

## 2024-10-15 MED ORDER — HYDROCORTISONE 10 MG PO TABS
20.0000 mg | ORAL_TABLET | Freq: Every day | ORAL | 3 refills | Status: AC
  Filled 2024-10-15: qty 270, 90d supply, fill #0

## 2024-10-15 MED ORDER — LEVOTHYROXINE SODIUM 150 MCG PO TABS
150.0000 ug | ORAL_TABLET | Freq: Every day | ORAL | 3 refills | Status: AC
  Filled 2024-10-15: qty 90, 90d supply, fill #0
  Filled 2025-01-18: qty 90, 90d supply, fill #1

## 2024-10-15 MED FILL — Levothyroxine Sodium Tab 150 MCG: ORAL | 30 days supply | Qty: 30 | Fill #0 | Status: CN

## 2024-10-15 NOTE — Progress Notes (Unsigned)
 Patient ID: Breklyn Fabrizio, female    DOB: 1961/09/11  MRN: [id number]  CC: Hospitalization Follow-up (Hospitalization f/u. Med refills/Sore throat, cough, white tongue, fever/) Date hosp: 10/20-23/2025 at [facility] Date call from RN: 10/12/2024 Dx: facial cellulitis and acute metabolic encephalopathy  Subjective: Chenita Puga is a 63 y.o. female who presents for [facility] visit. Daughter Veneta is with her. Her concerns today include:    Discussed the use of AI scribe software for clinical note transcription with the patient, who gave verbal consent to proceed.  History of Present Illness Yoland Najarro is a 63 year old female with facial cellulitis related to a dental infection who presents for follow-up after recent hospitalization. She is accompanied by her daughter, Iona.  She was hospitalized from October 20th to 23rd for facial cellulitis, suspected to be related to a dental infection. During her hospitalization, she experienced a change in mental status and had an elevated white blood cell count indicating an infection. A CT scan of the head showed no acute findings but revealed prominence of the brain ventricles. A CT scan of the neck and maxillofacial area confirmed cellulitis without an abscess. She was treated with antibiotics, increased steroid dosage, and fluids. She was discharged on antibiotics, including linezolid  and Augmentin , which she is continuing for a seven-day course.  She has a history of hypertension and is currently taking amlodipine  10 mg, hydralazine  25 mg three times a day, torsemide  20 mg as needed, and isosorbide  120 mg daily. She has not been regularly checking her blood pressure and has been consuming salted pistachios, which may have affected her blood pressure control. She has a device at home to monitor her blood pressure and plans to check it twice a week.  Her most recent glomerular filtration rate was in the thirties. She last saw her kidney specialist earlier  this year and plans to schedule a follow-up soon.  She has diabetes and was affected by high blood sugar levels during her hospital stay due to steroid administration. She uses a sensor to monitor her blood sugar, which was removed for a CT scan and has not been replaced since discharge. Her blood sugar was 135 mg/dL at discharge. She takes Lantus  insulin  5 units and Novolog  4 units before breakfast and dinner.  She has adrenal insufficiency and takes hydrocortisone  10 mg, two tablets three times a day with meals, doubling the dose when sick. She also takes Synthroid  for thyroid  management. There have been issues with refilling her prescriptions through her pharmacy, [facility], due to previous prescriptions being at [facility].  She has missed several specialist appointments, including eye and podiatry exams, and needs to reschedule them. She prefers to have a colonoscopy for colon cancer screening.    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   Hypokalemia 05/12/2021   Fecal impaction (HCC) 05/12/2021   Anemia due to chronic kidney disease 05/12/2021   Adrenal insufficiency (Addison's disease) (HCC) 05/12/2021   CKD (chronic kidney disease) stage 4, GFR 15-29 ml/min (HCC) 11/21/2020   Primary hypertension 11/21/2020   New onset  of congestive heart failure (HCC) 11/21/2020   Dehydration 12/09/2017   Abdominal pain, bilateral lower quadrant 12/09/2017   Hypoglycemia 07/08/2017   Hypoglycemia associated with diabetes (HCC)  05/10/2017   Malnutrition of moderate degree 05/10/2017     Current Outpatient Medications on File Prior to Visit  Medication Sig Dispense Refill   Accu-Chek Softclix Lancets lancets Use as instructed 100 each 12   albuterol  (VENTOLIN  HFA) 108 (90 Base) MCG/ACT inhaler Inhale 1-2 puffs into the lungs every 6 (six) hours as needed for wheezing or shortness of breath. (Patient taking differently: Inhale 2 puffs into the lungs every 6 (six) hours as needed for wheezing or shortness of breath.) 18 g 0   amLODipine  (NORVASC ) 10 MG tablet Take 1 tablet (10 mg total) by mouth daily. 90 tablet 1   amoxicillin -clavulanate (AUGMENTIN ) 500-125 MG tablet Take 1 tablet by mouth every 12 (twelve) hours for 7 days. 14 tablet 0   aspirin  81 MG chewable tablet Chew 81 mg by mouth daily.     CALCIUM  PO Take 1,200 mg by mouth in the morning.     Continuous Glucose Sensor (FREESTYLE LIBRE 3 PLUS SENSOR) MISC Change sensor every 15 days. 2 each 10   fluticasone  (FLONASE ) 50 MCG/ACT nasal spray Place 1 spray into both nostrils daily for 1 to 2 weeks then as needed thereafer. (Patient taking differently: Place 1 spray into both nostrils 2 (two) times daily as needed for allergies or rhinitis.) 16 g 0   Glucagon  (BAQSIMI  TWO PACK) 3 MG/DOSE POWD Place 3 mg into the nose once as needed for up to 1 dose. 1 each 11   hydrALAZINE  (APRESOLINE ) 25 MG tablet Take 1 tablet (25 mg total) by mouth 3 (three) times daily. 270 tablet 5   hydrocortisone  (CORTEF ) 10 MG tablet Take 2-3 tablets (20-30 mg total) by mouth daily. (Patient taking differently: Take 10 mg by mouth See admin instructions. Take 10 mg by mouth three times a day with meals and double the dose as directed when sick) 300 tablet 3   insulin  aspart (NOVOLOG  FLEXPEN) 100 UNIT/ML FlexPen 2 units subcut before meals.  Hold if BS before meal less than 180 (Patient taking differently: Inject 3-4 Units into the skin See admin instructions. Inject 3-4 units into the skin  before breakfast and supper) 15 mL 11   insulin  glargine (LANTUS  SOLOSTAR) 100 UNIT/ML Solostar Pen Inject 6 Units into the skin at bedtime. (Patient taking differently: Inject 5 Units into the skin daily before breakfast.) 15 mL PRN   Insulin  Pen Needle (PEN NEEDLES) 31G X 8 MM MISC UAD 100 each 6   isosorbide  mononitrate (IMDUR ) 120 MG 24 hr tablet Take 1 tablet (120 mg total) by mouth daily. 90 tablet 3   levothyroxine  (SYNTHROID ) 150 MCG tablet Take 1 tablet (150 mcg total) by mouth daily before breakfast. 90 tablet 3   linezolid  (ZYVOX ) 600 MG tablet Take 1 tablet (600 mg total) by mouth 2 (two) times daily for 7 days. 14 tablet 0   Multiple Vitamin (MULTIVITAMIN WITH MINERALS) TABS tablet Take 1 tablet by mouth daily.     omeprazole  (PRILOSEC) 20 MG capsule Take 1 capsule (20 mg total) by mouth daily. (Patient taking differently: Take 20 mg by mouth daily before lunch.) 30 capsule 4   ondansetron  (ZOFRAN -ODT) 4 MG disintegrating tablet Take 1 tablet (4 mg total) by mouth 2 (two) times daily as needed for nausea or vomiting. (Patient taking differently: Take 4 mg by mouth 2 (two)  times daily as needed for nausea or vomiting (dissolve orally).) 20 tablet 1   rosuvastatin  (CRESTOR ) 10 MG tablet Take 1 tablet (10 mg total) by mouth daily. Stop Zetia  90 tablet 3   sertraline  (ZOLOFT ) 50 MG tablet Take 1 tablet (50 mg total) by mouth daily. 90 tablet 1   torsemide  (DEMADEX ) 20 MG tablet Take 1 tablet (20 mg total) by mouth daily as needed (for weight gain of 3lbs in 1 day or 5 lbs in 2 days) 90 tablet 1   traMADol  (ULTRAM ) 50 MG tablet Take 1 tablet (50 mg total) by mouth 2 (two) times daily as needed. (Patient taking differently: Take 50 mg by mouth 2 (two) times daily as needed (for pain).) 60 tablet 1   vitamin B-12 (CYANOCOBALAMIN ) 1000 MCG tablet Take 1 tablet (1,000 mcg total) by mouth daily. 30 tablet 0   No current facility-administered medications on file prior to visit.    Allergies   Allergen Reactions   Bovine (Beef) Protein-Containing Drug Products Other (See Comments)    RELIGOUS  REASONS   Porcine (Pork) Protein-Containing Drug Products Other (See Comments)    RELIGIOUS REASONS   Percocet [Oxycodone -Acetaminophen ] Rash    Social History   Socioeconomic History   Marital status: Single    Spouse name: Not on file   Number of children: 5   Years of education: Not on file   Highest education level: High school graduate  Occupational History   Not on file  Tobacco Use   Smoking status: Never   Smokeless tobacco: Never  Vaping Use   Vaping status: Never Used  Substance and Sexual Activity   Alcohol use: No   Drug use: No   Sexual activity: Not on file  Other Topics Concern   Not on file  Social History Narrative   01/06/22 lives with son and his family   Social Drivers of Corporate Investment Banker Strain: Low Risk  (04/26/2024)   Overall Financial Resource Strain (CARDIA)    Difficulty of Paying Living Expenses: Not hard at all  Food Insecurity: No Food Insecurity (10/08/2024)   Hunger Vital Sign    Worried About Running Out of Food in the Last Year: Never true    Ran Out of Food in the Last Year: Never true  Transportation Needs: No Transportation Needs (10/08/2024)   PRAPARE - Administrator, Civil Service (Medical): No    Lack of Transportation (Non-Medical): No  Physical Activity: Insufficiently Active (04/26/2024)   Exercise Vital Sign    Days of Exercise per Week: 2 days    Minutes of Exercise per Session: 30 min  Stress: No Stress Concern Present (04/26/2024)   [employer] of Occupational Health - Occupational Stress Questionnaire    Feeling of Stress : Not at all  Social Connections: Socially Isolated (04/26/2024)   Social Connection and Isolation Panel    Frequency of Communication with Friends and Family: More than three times a week    Frequency of Social Gatherings with Friends and Family: More than three times a week     Attends Religious Services: Never    Database Administrator or Organizations: No    Attends Banker Meetings: Never    Marital Status: Divorced  Catering Manager Violence: Not At Risk (10/08/2024)   Humiliation, Afraid, Rape, and Kick questionnaire    Fear of Current or Ex-Partner: No    Emotionally Abused: No    Physically Abused: No  Sexually Abused: No    Family History  Problem Relation Age of Onset   Diabetes Mother    Hyperlipidemia Father    Hypertension Father    Adrenal disorder Neg Hx    Colon polyps Neg Hx    Colon cancer Neg Hx    Esophageal cancer Neg Hx    Rectal cancer Neg Hx    Stomach cancer Neg Hx     Past Surgical History:  Procedure Laterality Date   CESAREAN SECTION     IR THORACENTESIS ASP PLEURAL SPACE W/IMG GUIDE  06/05/2021    ROS: Review of Systems Negative except as stated above  PHYSICAL EXAM: BP (!) 154/59 (BP Location: Left Arm, Patient Position: Sitting, Cuff Size: Normal)   Pulse 76   Temp 98.6 F (37 C) (Oral)   Ht 5' 4 (1.626 m)   Wt 114 lb (51.7 kg)   SpO2 99%   BMI 19.57 kg/m   Physical Exam  General appearance - alert, well appearing, and in no distress Mental status - normal mood, behavior, speech, dress, motor activity, and thought processes Mouth - mucous membranes moist, pharynx normal without lesions Neck - no LN Chest - clear to auscultation, no wheezes, rales or rhonchi, symmetric air entry Heart - RRR, 2/6 SEM LSB Extremities - no edema      Latest Ref Rng & Units 10/11/2024    3:52 AM 10/10/2024    4:01 AM 10/09/2024    3:45 AM  CMP  Glucose 70 - 99 mg/dL 791  793  768   BUN 8 - 23 mg/dL 19  29  33   Creatinine 0.44 - 1.00 mg/dL 8.49  8.42  8.32   Sodium 135 - 145 mmol/L 134  136  131   Potassium 3.5 - 5.1 mmol/L 3.6  4.0  4.3   Chloride 98 - 111 mmol/L 101  103  97   CO2 22 - 32 mmol/L 24  23  21    Calcium  8.9 - 10.3 mg/dL 8.4  8.4  8.7   Total Protein 6.5 - 8.1 g/dL  5.9  6.6    Total Bilirubin 0.0 - 1.2 mg/dL  0.5  0.7   Alkaline Phos 38 - 126 U/L  68  84   AST 15 - 41 U/L  25  25   ALT 0 - 44 U/L  16  15    Lipid Panel     Component Value Date/Time   CHOL 190 04/26/2024 1513   TRIG 194 (H) 04/26/2024 1513   HDL 27 (L) 04/26/2024 1513   CHOLHDL 7.0 (H) 04/26/2024 1513   CHOLHDL 3.4 06/13/2011 0810   VLDL 17 06/13/2011 0810   LDLCALC 128 (H) 04/26/2024 1513    CBC    Component Value Date/Time   WBC 8.2 10/11/2024 0352   RBC 2.83 (L) 10/11/2024 0352   HGB 8.3 (L) 10/11/2024 0352   HGB 11.0 (L) 04/26/2024 1513   HCT 25.6 (L) 10/11/2024 0352   HCT 34.0 04/26/2024 1513   PLT 124 (L) 10/11/2024 0352   PLT 206 04/26/2024 1513   MCV 90.5 10/11/2024 0352   MCV 92 04/26/2024 1513   MCH 29.3 10/11/2024 0352   MCHC 32.4 10/11/2024 0352   RDW 12.9 10/11/2024 0352   RDW 12.9 04/26/2024 1513   LYMPHSABS 1.5 10/10/2024 0401   LYMPHSABS 3.0 01/12/2023 1046   MONOABS 0.4 10/10/2024 0401   EOSABS 0.0 10/10/2024 0401   EOSABS 0.2 01/12/2023 1046   BASOSABS 0.0  10/10/2024 0401   BASOSABS 0.1 01/12/2023 1046    ASSESSMENT AND PLAN:  Assessment and Plan Assessment & Plan Facial cellulitis Recent hospitalization for facial cellulitis secondary to dental infection. Discharged on linezolid , Augmentin , and steroids. - Complete 7-day course of linezolid  and Augmentin . - Arrange follow-up with dentist after completing antibiotics.  Diabetes mellitus Diabetes management complicated by steroid-induced hyperglycemia. Home glucose monitoring interrupted. - Reapply glucose sensor today. - Check blood sugars regularly. - Continue Lantus  5 units daily and Novolog  4 units before breakfast and dinner.  Hypertension Hypertension managed with amlodipine , hydralazine , torsemide , and isosorbide . Recent high salt intake affecting control. - Advise switching to unsalted nuts. - Check blood pressure at least twice a week with a goal of 130/80 mmHg or lower.  Chronic  kidney disease Chronic kidney disease with GFR in the 30s. Follow-up with nephrologist due. - Schedule follow-up with nephrologist.  Adrenal insufficiency Adrenal insufficiency managed with hydrocortisone . Issues with prescription refills due to pharmacy transition. - Send new prescriptions for hydrocortisone , hydralazine , and Synthroid  to [facility] pharmacy. - Clarify hydrocortisone  dosing with endocrinologist at next visit.  General Health Maintenance Flu shot received. Missed eye exam and podiatrist appointments. Pending Pap smear and colonoscopy. - Call [facility] to reschedule eye exam. - Call podiatrist to reschedule appointment. - Call [facility] to schedule Pap smear. - Call [facility] to schedule colonoscopy.  Follow-up Endocrinologist appointment scheduled for November 20th. Insulin  supply needs verification. - Ensure follow-up with endocrinologist on November 20th. - Check insulin  supply at home and request refills if needed. - Call specialists as discussed to schedule appointments.     1. Type 2 diabetes mellitus with hyperglycemia, with long-term current use of insulin  (HCC) ***  2. Osteoarthritis of both knees, unspecified osteoarthritis type ***  3. Major depressive disorder, recurrent episode, mild ***  4. Hypertensive kidney disease with stage 3b chronic kidney disease (HCC) ***    Patient was given the opportunity to ask questions.  Patient verbalized understanding of the plan and was able to repeat key elements of the plan.   This documentation was completed using Paediatric nurse.  Any transcriptional errors are unintentional.  No orders of the defined types were placed in this encounter.    Requested Prescriptions   Pending Prescriptions Disp Refills   insulin  aspart (NOVOLOG  FLEXPEN) 100 UNIT/ML FlexPen 15 mL 11    Sig: 2 units subcut before meals.  Hold if BS before meal less than 180    hydrocortisone  (CORTEF ) 10 MG tablet 300 tablet 3    Sig: Take 2-3 tablets (20-30 mg total) by mouth daily.   insulin  glargine (LANTUS  SOLOSTAR) 100 UNIT/ML Solostar Pen 15 mL PRN    Sig: Inject 6 Units into the skin at bedtime.   levothyroxine  (SYNTHROID ) 150 MCG tablet 90 tablet 3    Sig: Take 1 tablet (150 mcg total) by mouth daily before breakfast.   traMADol  (ULTRAM ) 50 MG tablet 60 tablet 1    Sig: Take 1 tablet (50 mg total) by mouth 2 (two) times daily as needed.   sertraline  (ZOLOFT ) 50 MG tablet 90 tablet 1    Sig: Take 1 tablet (50 mg total) by mouth daily.   torsemide  (DEMADEX ) 20 MG tablet 90 tablet 1    Sig: Take 1 tablet (20 mg total) by mouth daily as needed (for weight gain of 3lbs in 1 day or 5 lbs in 2 days)   Insulin  Pen Needle (PEN NEEDLES) 31G X  8 MM MISC 100 each 6    Sig: UAD    No follow-ups on file.  Barnie Louder, MD, FACP

## 2024-10-15 NOTE — Patient Instructions (Signed)
  VISIT SUMMARY: You had a follow-up appointment today after your recent hospitalization for facial cellulitis due to a dental infection. We reviewed your current medications, blood pressure, blood sugar levels, and other health concerns. We also discussed the need to reschedule some of your specialist appointments.  YOUR PLAN: -FACIAL CELLULITIS: Facial cellulitis is a bacterial skin infection that causes redness, swelling, and pain. You were hospitalized for this condition and are currently on antibiotics (linezolid  and Augmentin ) and steroids. Please complete the 7-day course of antibiotics and arrange a follow-up with your dentist after finishing the medication.  -DIABETES MELLITUS: Diabetes is a condition where your blood sugar levels are too high. Your blood sugar was affected by steroids during your hospital stay. Please reapply your glucose sensor today, check your blood sugars regularly, and continue taking Lantus  5 units daily and Novolog  4 units before breakfast and dinner.  -HYPERTENSION: Hypertension is high blood pressure. Your blood pressure control was affected by high salt intake. Please switch to unsalted nuts and check your blood pressure at least twice a week, aiming for a goal of 130/80 mmHg or lower.  -CHRONIC KIDNEY DISEASE: Chronic kidney disease means your kidneys are not working as well as they should. Your recent glomerular filtration rate (GFR) was in the 30s. Please schedule a follow-up with your nephrologist.  -ADRENAL INSUFFICIENCY: Adrenal insufficiency is when your adrenal glands do not produce enough hormones. You are taking hydrocortisone  for this condition. We will send new prescriptions for hydrocortisone , hydralazine , and Synthroid  to [facility] pharmacy. Please clarify your hydrocortisone  dosing with your endocrinologist at your next visit.  HOLD of on Taking TRAMADOL  until you have completed the Linezolid  antibiotic.    -GENERAL HEALTH MAINTENANCE: You  received your flu shot. You need to reschedule your eye exam and podiatrist appointments, and schedule a Pap smear and colonoscopy. Please call [facility], your podiatrist, the [facility], and [facility] to make these appointments.  INSTRUCTIONS: Please ensure you follow up with your endocrinologist on November 20th. Check your insulin  supply at home and request refills if needed. Call the specialists as discussed to schedule your appointments.                      Contains text generated by [facility].                                 Contains text generated by [facility].

## 2024-10-17 ENCOUNTER — Encounter: Payer: Self-pay | Admitting: Internal Medicine

## 2024-10-18 ENCOUNTER — Other Ambulatory Visit: Payer: Self-pay

## 2024-10-18 ENCOUNTER — Other Ambulatory Visit (HOSPITAL_BASED_OUTPATIENT_CLINIC_OR_DEPARTMENT_OTHER): Payer: Self-pay

## 2024-10-19 ENCOUNTER — Other Ambulatory Visit (HOSPITAL_BASED_OUTPATIENT_CLINIC_OR_DEPARTMENT_OTHER): Payer: Self-pay

## 2024-10-19 ENCOUNTER — Telehealth: Payer: Self-pay

## 2024-10-19 NOTE — Telephone Encounter (Signed)
 Pharmacy Patient Advocate Encounter  Received notification from [facility] MEDICAID that Prior Authorization for TRAMADOL  has been APPROVED from 10/18/2024 to 04/16/2025   PA #/Case ID/Reference #: [id number]

## 2024-10-29 ENCOUNTER — Telehealth: Payer: Self-pay | Admitting: Internal Medicine

## 2024-10-29 NOTE — Telephone Encounter (Signed)
 Please advise, if patient needs an appointment to get medical clearance form completed. Thank you.

## 2024-10-29 NOTE — Telephone Encounter (Addendum)
 Medical Clearance fax received from [facility]. Form placed in provider's box.

## 2024-10-29 NOTE — Telephone Encounter (Signed)
 Copied from CRM [id number]. Topic: Appointments - Scheduling Inquiry for Clinic >> Oct 29, 2024 12:02 PM Winona R wrote:  Pt schedule for medical clearance appointment 11/18 for clearance to possibly have a tooth removed while on blood thinners. Pt daughter would like to know if the pt need to come in or if clearance can be provided without an appointment. Pt was seen for hospital follow up 11/18. Please contact the pt if she does not need the appointment, if she does need the appointment please contact the pt with any sooner avail.   Zara

## 2024-10-30 ENCOUNTER — Encounter: Payer: Self-pay | Admitting: Internal Medicine

## 2024-10-30 NOTE — Telephone Encounter (Signed)
 Will take a look at the form during my administrative time tomorrow and let them know if she needs to keep appt on the 18th or not.

## 2024-10-30 NOTE — Telephone Encounter (Signed)
 Noted

## 2024-10-30 NOTE — Telephone Encounter (Signed)
 Patient identified by name and date of birth by daughter [name].  Patient aware of provider response and voiced understanding.

## 2024-10-30 NOTE — Telephone Encounter (Signed)
 Copied from CRM [id number]. Topic: Appointments - Scheduling Inquiry for Clinic >> Oct 30, 2024  9:16 AM Jeoffrey H wrote:  Reason for CRM:  [name] (patients daughter) wanted to follow up on if her mom still needed the follow up visit on 11/18 for the medical clearance because she was just seen by her 10/27. Needs medical clearance for dentist to perform surgery. I did advise her that the medical clearance form was received 11/10 and placed in providers mailbox to review. Patient daughter stated they needed to know this information ASAP so that they could cancel the appointment if no longer needed. Please call [name] with any updates.   [name]

## 2024-10-31 ENCOUNTER — Telehealth: Payer: Self-pay | Admitting: Internal Medicine

## 2024-10-31 NOTE — Telephone Encounter (Signed)
 Let patient's daughter know that I will not need to see her prior to having dental extractions.  However she needs to make sure that she takes her blood pressure medications before going to the dental appointment the morning of the extractions.  If blood pressure is elevated, they most likely will cancel the procedure.  It is also important that she takes her hydrocortisone  as prescribed.  Hold the aspirin  for 5 days prior to the extractions.  [name] I will leave form on your desk for you to fax back to the dentist tomorrow.

## 2024-11-01 NOTE — Telephone Encounter (Signed)
 MyChart message sent to the patient. Patient and daughter has seen provider message via Mychart.   Form successfully back to [facility] on 11/01/24. Fax #: [phone].

## 2024-11-03 ENCOUNTER — Other Ambulatory Visit: Payer: Self-pay

## 2024-11-06 ENCOUNTER — Ambulatory Visit: Admitting: Internal Medicine

## 2024-11-07 NOTE — Telephone Encounter (Signed)
 Copied from CRM [id number]. Topic: Medical Record Request - Provider/Facility Request >> Nov 07, 2024  2:20 PM Ashley R wrote:  Reason for CRM: Medical clearance form sent 11/13 was not rec'd by Dental office. Requesting it is resent to number or email on form [id number]

## 2024-11-08 ENCOUNTER — Ambulatory Visit: Admitting: Internal Medicine

## 2024-11-08 NOTE — Progress Notes (Deleted)
 Patient ID: [name], female   DOB: 31-Aug-1961, 63 y.o.   MRN: [id number]  HPI: [name] is a 63 y.o.-year-old female, returning for follow-up for DM2, h/o GDM, dx in 2004, insulin -dependent, uncontrolled, with complications (aortic atherosclerosis, CHF, CKD, history of hypoglycemia) and also adrenal insufficiency and hypothyroidism. Pt. previously saw Dr. Kassie.  Last visit with me was 3 months ago.  She is here with her daughter who offers part of the history and gives more information about patient's blood sugars and activity.  Interim history: No increased urination, increased thirst, chest pain, and her nausea improved since last visit. She was recently admitted with facial cellulitis 09/2024.  DM2:  Reviewed HbA1c: Lab Results  Component Value Date   HGBA1C 8.8 (H) 10/09/2024   HGBA1C 7.3 (A) 06/21/2024   HGBA1C 8.8 (A) 04/26/2024   HGBA1C (A) 05/13/2023     Comment:     >15.0   HGBA1C 9.4 (H) 01/12/2023   HGBA1C 5.0 02/03/2022   HGBA1C 5.3 10/16/2021   HGBA1C 8.6 (H) 02/13/2021   HGBA1C 6.6 10/29/2020   HGBA1C 9.2 (H) 03/28/2018   In 04/2023, she was started on: - Lantus  6 units daily at 6 pm only if sugars are high at night: >180-190 (only 3-4x a week) - Novolog  2 units before each meal >> only if sugars are high after b'fast She was on Repaglinide  in the past 2/2 multiple hypoglycemia episodes. She was on insulin  long time ago. She was on Metformin, but taken off to kidney dysfunction.  In 2025 we changed the regimen: - Lantus  5 >> 4.5 >> 5 units in a.m.  She is also taking: - Aspart 2 units before dinner >> 3 to 4 units before breakfast and dinner  Pt checks her sugars >4x a day with her libre CGM:  Previously:  -  Prev. - am: 54-74 >> 50s-70s - 2h after b'fast: n/c >> 100-110 - before lunch: 100-120 >> n/c - 2h after lunch: 140-150 >> 195 (pizza) - before dinner: 105-110 >> n/c - 2h after dinner: n/c >> 160-180 - bedtime: n/c - nighttime:  n/c Lowest sugar was 54 >> 50s >> 40s; she has hypoglycemia awareness at 70.  Highest sugar was 150 >> 195 >> >350.  Glucometer: Accu-Chek  Pt's meals are: - Breakfast: toast or crackers, tea - Lunch: soup or rice + chicken + veggies - Dinner: same or curry + rothis - Snacks: occasionally  - + CKD - sees nephrology, last BUN/creatinine:  Lab Results  Component Value Date   BUN 19 10/11/2024   BUN 29 (H) 10/10/2024   CREATININE 1.50 (H) 10/11/2024   CREATININE 1.57 (H) 10/10/2024   Lab Results  Component Value Date   MICRALBCREAT 3 04/26/2024   MICRALBCREAT 16 05/13/2023   MICRALBCREAT 39 (H) 10/29/2020  She is not on ACE inhibitor/ARB.  -+ Significant HL; last set of lipids: Lab Results  Component Value Date   CHOL 190 04/26/2024   HDL 27 (L) 04/26/2024   LDLCALC 128 (H) 04/26/2024   TRIG 194 (H) 04/26/2024   CHOLHDL 7.0 (H) 04/26/2024  She was previously off statins due to transaminitis.  I previously suggested Zetia  10 mg daily.  However, she is now on Crestor  10 mg daily.   Latest LFTs were normal: Lab Results  Component Value Date   ALT 16 10/10/2024   AST 25 10/10/2024   ALKPHOS 68 10/10/2024   BILITOT 0.5 10/10/2024    - last eye exam was in 2023. No  DR reportedly. + cataracts.  - no numbness and tingling in her feet.  Last foot exam 05/24/2024 by Dr. Loreda.  GES (06/14/2024): Normal  + Extensive FH of DM and prediabetes, including in daughters.  Hypothyroidism:  Pt is on levothyroxine  150 mcg daily, taken: - in am - fasting - stopped coffee - at least 30 min from b'fast - + calcium , MVI, PPI in am!! >>  Moved later in the day - no iron  - not on Biotin  Reviewed her TSH levels: Lab Results  Component Value Date   TSH 1.830 10/09/2024   TSH 7.07 (H) 08/08/2024   TSH 2.940 04/26/2024   TSH 0.05 (L) 03/02/2023   TSH 0.341 (L) 01/12/2023   TSH 15.900 (H) 03/01/2022   TSH 4.810 (H) 01/18/2022   TSH 1.738 06/02/2021   TSH 0.989 05/13/2021   TSH  3.03 02/13/2021   TSH 17.700 (H) 10/29/2020   TSH 6.165 (H) 03/28/2018   TSH 3.193 05/10/2017   TSH 4.233 06/13/2011   TSH 3.862 06/12/2011   TSH 17.622 (H) 05/11/2011   + FH of thyroid  ds. In niece and nephew. No FH of ThyCA.  Adrenal insufficiency:  - dx'ed in 2012, during hospital stay  for cardioarrhythmia. - She came off Hydrocortisone  in 2022 - as she had a period w/o insurance.  However, she was started back on hydrocortisone  by Dr. Kassie in 01/2022 - She is on: Hydrocortisone  30 >> now 20 mg daily in am  Reviewed previous investigation:   It appears that the 01/2022 set of labs were checked on hydrocortisone , but I am not sure if the rest of the measurements were checked off or on hydrocortisone .  However, I believe that the levels obtained in 2012 were checked before starting hydrocortisone : Component     Latest Ref Rng 05/11/2011 05/12/2011 06/12/2011  Cortisol, Base     ug/dL  0.5.    Cortisol, 30 Min     >20 ug/dL  85.2    Cortisol, 60 Min     >20 ug/dL  84.8    Cortisol - AM     4.3 - 22.4 ug/dL 0.5 (L)     Cortisol, Plasma     ug/dL   6.5     Her [id number] antibodies were not elevated in 12/2022 (see below).  ROS: + see HPI  Past Medical History:  Diagnosis Date   Adrenal insufficiency, primary (HCC)    Anemia 2018   Hgb 6.8 early 11/2020.  PRBC x 1.  06/2017 PRBC x 1.     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/2021   Stage 4 as of late May 2022   Diabetes mellitus without complication (HCC)    Hypertension    Hypothyroidism    Past Surgical History:  Procedure Laterality Date   CESAREAN SECTION     IR THORACENTESIS ASP PLEURAL SPACE W/IMG GUIDE  06/05/2021   Social History   Socioeconomic History   Marital status: Single    Spouse name: Not on file   Number of children: 5   Years of education: Not on file   Highest  education level: High school graduate  Occupational History   Not on file  Tobacco Use   Smoking status: Never   Smokeless tobacco: Never  Vaping Use   Vaping status: Never Used  Substance and Sexual Activity   Alcohol use: No  Drug use: No   Sexual activity: Not on file  Other Topics Concern   Not on file  Social History Narrative   01/06/22 lives with son and his family   Social Drivers of Corporate Investment Banker Strain: Low Risk  (04/26/2024)   Overall Financial Resource Strain (CARDIA)    Difficulty of Paying Living Expenses: Not hard at all  Food Insecurity: No Food Insecurity (10/08/2024)   Hunger Vital Sign    Worried About Running Out of Food in the Last Year: Never true    Ran Out of Food in the Last Year: Never true  Transportation Needs: No Transportation Needs (10/08/2024)   PRAPARE - Administrator, Civil Service (Medical): No    Lack of Transportation (Non-Medical): No  Physical Activity: Insufficiently Active (04/26/2024)   Exercise Vital Sign    Days of Exercise per Week: 2 days    Minutes of Exercise per Session: 30 min  Stress: No Stress Concern Present (04/26/2024)   [employer] of Occupational Health - Occupational Stress Questionnaire    Feeling of Stress : Not at all  Social Connections: Socially Isolated (04/26/2024)   Social Connection and Isolation Panel    Frequency of Communication with Friends and Family: More than three times a week    Frequency of Social Gatherings with Friends and Family: More than three times a week    Attends Religious Services: Never    Database Administrator or Organizations: No    Attends Banker Meetings: Never    Marital Status: Divorced  Catering Manager Violence: Not At Risk (10/08/2024)   Humiliation, Afraid, Rape, and Kick questionnaire    Fear of Current or Ex-Partner: No    Emotionally Abused: No    Physically Abused: No    Sexually Abused: No   Current Outpatient Medications on  File Prior to Visit  Medication Sig Dispense Refill   Accu-Chek Softclix Lancets lancets Use as instructed 100 each 12   albuterol  (VENTOLIN  HFA) 108 (90 Base) MCG/ACT inhaler Inhale 1-2 puffs into the lungs every 6 (six) hours as needed for wheezing or shortness of breath. (Patient taking differently: Inhale 2 puffs into the lungs every 6 (six) hours as needed for wheezing or shortness of breath.) 18 g 0   amLODipine  (NORVASC ) 10 MG tablet Take 1 tablet (10 mg total) by mouth daily. 90 tablet 1   aspirin  81 MG chewable tablet Chew 81 mg by mouth daily.     CALCIUM  PO Take 1,200 mg by mouth in the morning.     Continuous Glucose Sensor (FREESTYLE LIBRE 3 PLUS SENSOR) MISC Change sensor every 15 days. 2 each 10   fluticasone  (FLONASE ) 50 MCG/ACT nasal spray Place 1 spray into both nostrils daily for 1 to 2 weeks then as needed thereafer. (Patient taking differently: Place 1 spray into both nostrils 2 (two) times daily as needed for allergies or rhinitis.) 16 g 0   Glucagon  (BAQSIMI  TWO PACK) 3 MG/DOSE POWD Place 3 mg into the nose once as needed for up to 1 dose. 1 each 11   hydrALAZINE  (APRESOLINE ) 25 MG tablet Take 1 tablet (25 mg total) by mouth 3 (three) times daily. 270 tablet 5   hydrocortisone  (CORTEF ) 10 MG tablet Take 2-3 tablets (20-30 mg total) by mouth daily. 300 tablet 3   insulin  aspart (NOVOLOG  FLEXPEN) 100 UNIT/ML FlexPen Inject 3-4 units into the skin before breakfast and supper. 15 mL 2   insulin  glargine (  LANTUS  SOLOSTAR) 100 UNIT/ML Solostar Pen Inject 5 Units into the skin daily before breakfast. 15 mL 1   Insulin  Pen Needle (PEN NEEDLES) 31G X 8 MM MISC Use daily as directed. 100 each 6   isosorbide  mononitrate (IMDUR ) 120 MG 24 hr tablet Take 1 tablet (120 mg total) by mouth daily. 90 tablet 3   levothyroxine  (SYNTHROID ) 150 MCG tablet Take 1 tablet (150 mcg total) by mouth daily before breakfast. 90 tablet 3   Multiple Vitamin (MULTIVITAMIN WITH MINERALS) TABS tablet Take 1  tablet by mouth daily.     omeprazole  (PRILOSEC) 20 MG capsule Take 1 capsule (20 mg total) by mouth daily. (Patient taking differently: Take 20 mg by mouth daily before lunch.) 30 capsule 4   ondansetron  (ZOFRAN -ODT) 4 MG disintegrating tablet Take 1 tablet (4 mg total) by mouth 2 (two) times daily as needed for nausea or vomiting. (Patient taking differently: Take 4 mg by mouth 2 (two) times daily as needed for nausea or vomiting (dissolve orally).) 20 tablet 1   rosuvastatin  (CRESTOR ) 10 MG tablet Take 1 tablet (10 mg total) by mouth daily. Stop Zetia  90 tablet 3   sertraline  (ZOLOFT ) 50 MG tablet Take 1 tablet (50 mg total) by mouth daily. 90 tablet 1   torsemide  (DEMADEX ) 20 MG tablet Take 1 tablet (20 mg total) by mouth daily as needed (for weight gain of 3lbs in 1 day or 5 lbs in 2 days) 90 tablet 1   traMADol  (ULTRAM ) 50 MG tablet Take 1 tablet (50 mg total) by mouth 2 (two) times daily as needed. 60 tablet 1   vitamin B-12 (CYANOCOBALAMIN ) 1000 MCG tablet Take 1 tablet (1,000 mcg total) by mouth daily. 30 tablet 0   No current facility-administered medications on file prior to visit.   Allergies  Allergen Reactions   Bovine (Beef) Protein-Containing Drug Products Other (See Comments)    RELIGOUS  REASONS   Porcine (Pork) Protein-Containing Drug Products Other (See Comments)    RELIGIOUS REASONS   Percocet [Oxycodone -Acetaminophen ] Rash   Family History  Problem Relation Age of Onset   Diabetes Mother    Hyperlipidemia Father    Hypertension Father    Adrenal disorder Neg Hx    Colon polyps Neg Hx    Colon cancer Neg Hx    Esophageal cancer Neg Hx    Rectal cancer Neg Hx    Stomach cancer Neg Hx    PE: There were no vitals taken for this visit. Wt Readings from Last 15 Encounters:  10/15/24 114 lb (51.7 kg)  10/08/24 104 lb 15 oz (47.6 kg)  08/08/24 105 lb (47.6 kg)  06/21/24 102 lb 9.6 oz (46.5 kg)  06/11/24 102 lb (46.3 kg)  05/11/24 103 lb 14.4 oz (47.1 kg)   04/26/24 107 lb (48.5 kg)  10/08/23 114 lb (51.7 kg)  07/07/23 113 lb (51.3 kg)  05/13/23 113 lb (51.3 kg)  01/13/23 116 lb 3.2 oz (52.7 kg)  01/12/23 118 lb 12.8 oz (53.9 kg)  07/30/22 100 lb 1.4 oz (45.4 kg)  07/17/22 100 lb 1.4 oz (45.4 kg)  03/01/22 100 lb 2 oz (45.4 kg)   Constitutional: Thin, in NAD Eyes:  EOMI, no exophthalmos ENT: no neck masses, no cervical lymphadenopathy Cardiovascular: RRR, No MRG Respiratory: CTA B Musculoskeletal: no deformities Skin:no rashes Neurological: no tremor with outstretched hands  ASSESSMENT: 1. DM, non-insulin -dependent, uncontrolled, with complications - CHF - Aortic atherosclerosis - CKD stage III-IV  - No autoimmunity or insulin  deficiency:  Component     Latest Ref Rng 01/13/2023  Glucose     65 - 99 mg/dL 823 (H)   Glutamic Acid Decarb Ab     <5 IU/mL <5   ZNT8 Antibodies     <15 U/mL <10   IA-2 Antibody     <5.4 U/mL <5.4   Insulin  Antibodies, Human     <0.4 U/mL <0.4   C-Peptide     0.80 - 3.85 ng/mL 1.29    2.  Hypothyroidism - No autoimmunity: Component     Latest Ref Rng 01/13/2023  Thyroglobulin Ab     < or = 1 IU/mL <1   Thyroperoxidase Ab SerPl-aCnc     <9 IU/mL 2    3.  Adrenal insufficiency - No autoimmunity: Component     Latest Ref Rng 01/13/2023  21-Hydroxylase Antibodies     NEGATIVE  NEGATIVE    4. HL - has FH of this   PLAN:  1.  Complex patient with longstanding, uncontrolled, insulin -dependent diabetes, previously lost insulin , but with very high HbA1c levels (9.4%, in 12/2022 and then about 15% in 04/2023 at her visit with PCP).  She was started on Lantus  and NovoLog .  Sugars improved.  At last visit, HbA1c was 7.3%, but since then she had another HbA1c that increased significantly, to 8.8%. - Reviewing the CGM trends at last visit, sugars were much worse in the previous 2 weeks compared to a month prior to the visit.  The daughter was concerned about her mother forgetting the medications  and not remembering to take the NovoLog  before every meal.  We discussed about other options for treatment.  I did not feel that oral medications would work for her and she was not a candidate for GLP-1 receptor agonist due to nausea and weight loss.  Daughter agreed.  A premixed insulin  regimen would not have been ideal due to the lack of flexibility.  Daughter eventually agreed to continue the Lantus -NovoLog  regimen but we decided to use NovoLog  only before breakfast and dinner.  I did advise him to use 5 units of Lantus  in the morning and start adjusting the dose of NovoLog  based on the size of the meal and the sugars before the meals. CGM interpretation: -At today's visit, we reviewed her CGM downloads: It appears that *** of values are in target range (goal >70%), while *** are higher than 180 (goal <25%), and *** are lower than 70 (goal <4%).  The calculated average blood sugar is ***.  The projected HbA1c for the next 3 months (GMI) is ***. -Reviewing the CGM trends, *** above.- I suggested to:  Patient Instructions  Please continue: - Lantus  5 units  before b'fast - Novolog  3-4 units before b'fast and dinner  Please continue hydrocortisone  20 mg daily.  Please continue levothyroxine  150 mcg daily.  Take the thyroid  hormone every day, with water, at least 30 minutes before breakfast, separated by at least 4 hours from: - acid reflux medications - calcium  - iron  - multivitamins  Regarding hydrocortisone : - You absolutely need to take this medication every day and not skip doses. - Please double the dose if you have a fever, for the duration of the fever. - If you cannot take anything by mouth (vomiting) or you have severe diarrhea so that you eliminate the hydrocortisone  pills in your stool, please make sure that you get the hydrocortisone  in the vein instead - go to the nearest emergency department/urgent care or you may go to  your PCPs office  - Please try to get a MedAlert bracelet  or pendant indicating: Adrenal insufficiency.   Please return in 3 months.  - we checked her HbA1c: 7%  - advised to check sugars at different times of the day - 4x a day, rotating check times - advised for yearly eye exams >> she is UTD -She has a very high LDL and previously not on a statin due to history of transaminitis.  Currently on Crestor  10 mg daily. - return to clinic in 3 months  2.  Hypothyroidism - latest thyroid  labs reviewed with pt. >> normal: Lab Results  Component Value Date   TSH 1.830 10/09/2024  - she continues on LT4 150 mcg daily - pt feels good on this dose. - we discussed about taking the thyroid  hormone every day, with water, >30 minutes before breakfast, separated by >4 hours from acid reflux medications, calcium , iron , multivitamins. Pt. is taking it correctly no.  At last visit, TSH was elevated as she was taking calcium , multivitamins, and PPIs only 30 minutes after LT4.  Removed teeth at least 4 hours later.  3.  Adrenal insufficiency -Patient with history of primary adrenal insufficiency per review of Dr. Laymond notes.  This was diagnosed in 2012.  She was initially started on hydrocortisone  and she continued to this for 10 years.  She was briefly off the medication after cortisol levels were normal, but had to restart in 2022.  Reviewing the labs from 2012, she had a low a.m. cortisol, but it stimulated to 15, which, per today's standards is considered normal.  I do not have an ACTH  from 2012, but this was low in 2022.  However, it is unclear whether it was checked off hydrocortisone  at that time.  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 - She was previously taking 30 mg of hydrocortisone  daily. We discussed that this is a high dose for her BMI and we should try to decrease the dose slowly, to the  minimum dose that allows her to feel well.  I gave her a protocol to decrease her hydrocortisone  dose to 20 mg daily.  She continues on this dose now.  She is taking the hydrocortisone  dose all in the morning. - She previously had nausea and headaches before and we discussed about taking the hydrocortisone  right before meals.  Her nausea improved. - I reiterated sick day rules: - You absolutely need to take this medication every day and not skip doses. - Please double the dose if you have a fever, for the duration of the fever. - If you cannot take anything by mouth (vomiting) or you have severe diarrhea so that you eliminate the hydrocortisone  pills in your stool, please make sure that you get the hydrocortisone  in the vein instead - go to the nearest emergency department/urgent care or you may go to your PCPs office  - Please try to get a MedAlert bracelet or pendant indicating: Adrenal insufficiency.  - We discussed about staying with the lowest dose of hydrocortisone  that allows her to feel well.  We discussed about the possible side effects of taking too much thyroid  - We did previously check her 21-hydroxylase antibodies and they were negative, ruling out Addison's disease as a cause for her adrenal insufficiency  Lela Fendt, MD PhD [facility]

## 2024-11-09 ENCOUNTER — Other Ambulatory Visit: Payer: Self-pay

## 2024-11-24 ENCOUNTER — Other Ambulatory Visit: Payer: Self-pay

## 2024-11-24 ENCOUNTER — Encounter: Payer: Self-pay | Admitting: Pharmacist

## 2024-11-30 ENCOUNTER — Other Ambulatory Visit: Payer: Self-pay

## 2024-12-24 ENCOUNTER — Other Ambulatory Visit (HOSPITAL_BASED_OUTPATIENT_CLINIC_OR_DEPARTMENT_OTHER): Payer: Self-pay

## 2024-12-24 MED ORDER — IBUPROFEN 800 MG PO TABS
800.0000 mg | ORAL_TABLET | Freq: Three times a day (TID) | ORAL | 1 refills | Status: AC | PRN
  Filled 2024-12-24: qty 20, 10d supply, fill #0

## 2024-12-24 MED ORDER — METHYLPREDNISOLONE 4 MG PO TBPK
ORAL_TABLET | ORAL | 0 refills | Status: AC
  Filled 2024-12-24: qty 21, 6d supply, fill #0

## 2024-12-24 MED ORDER — CHLORHEXIDINE GLUCONATE 0.12 % MT SOLN
OROMUCOSAL | 0 refills | Status: AC
  Filled 2024-12-24: qty 473, 30d supply, fill #0

## 2024-12-24 MED ORDER — AMOXICILLIN 500 MG PO CAPS
500.0000 mg | ORAL_CAPSULE | Freq: Three times a day (TID) | ORAL | 0 refills | Status: AC
  Filled 2024-12-24: qty 30, 10d supply, fill #0

## 2025-01-09 ENCOUNTER — Other Ambulatory Visit: Payer: Self-pay | Admitting: Internal Medicine

## 2025-01-09 ENCOUNTER — Other Ambulatory Visit: Payer: Self-pay

## 2025-01-09 ENCOUNTER — Other Ambulatory Visit (HOSPITAL_COMMUNITY): Payer: Self-pay

## 2025-01-09 DIAGNOSIS — R059 Cough, unspecified: Secondary | ICD-10-CM

## 2025-01-09 MED ORDER — FLUTICASONE PROPIONATE 50 MCG/ACT NA SUSP
1.0000 | Freq: Every day | NASAL | 0 refills | Status: AC
  Filled 2025-01-09: qty 16, 60d supply, fill #0

## 2025-01-09 NOTE — Telephone Encounter (Signed)
 Requested Prescriptions  Pending Prescriptions Disp Refills   fluticasone  (FLONASE ) 50 MCG/ACT nasal spray 16 g 0    Sig: Place 1 spray into both nostrils daily for 1 to 2 weeks then as needed thereafer.     Ear, Nose, and Throat: Nasal Preparations - Corticosteroids Passed - 01/09/2025  3:53 PM      Passed - Valid encounter within last 12 months    Recent Outpatient Visits           2 months ago Hospital discharge follow-up   [facility] [place]. [facility] Vicci Barnie NOVAK, MD   7 months ago Cough in adult   [facility] [place] [facility] Of [place]. [facility] Vic[facility] Barnie B, MD   8 months ago Type 2 diabetes mellitus with other specified complication, with long-term current use of insulin  [facility])   [place] [facility] [place] [place]. [facility] Barnie B, MD   1 year ago Type 2 diabetes mellitus with hyperglycemia, with long-term current use of insulin  [facility])   [place] [facility] [place] [facility] Of [place]. [facility] Vic[facility] Barnie NOVAK, MD   1 year ago Other fatigue   [place] [facility] [place] - A Dept Of . [facility] Arthur, [place], PA-C

## 2025-01-10 ENCOUNTER — Other Ambulatory Visit (HOSPITAL_COMMUNITY): Payer: Self-pay

## 2025-01-10 ENCOUNTER — Other Ambulatory Visit: Payer: Self-pay

## 2025-01-11 ENCOUNTER — Other Ambulatory Visit: Payer: Self-pay

## 2025-01-11 ENCOUNTER — Other Ambulatory Visit (HOSPITAL_COMMUNITY): Payer: Self-pay

## 2025-01-14 ENCOUNTER — Other Ambulatory Visit (HOSPITAL_COMMUNITY): Payer: Self-pay

## 2025-01-14 ENCOUNTER — Other Ambulatory Visit (HOSPITAL_BASED_OUTPATIENT_CLINIC_OR_DEPARTMENT_OTHER): Payer: Self-pay

## 2025-01-14 ENCOUNTER — Other Ambulatory Visit: Payer: Self-pay

## 2025-01-15 ENCOUNTER — Other Ambulatory Visit: Payer: Self-pay

## 2025-01-15 ENCOUNTER — Encounter: Payer: Self-pay | Admitting: Pharmacist

## 2025-01-18 ENCOUNTER — Ambulatory Visit: Payer: Self-pay | Admitting: Pharmacist

## 2025-01-18 ENCOUNTER — Telehealth (HOSPITAL_COMMUNITY): Payer: Self-pay

## 2025-01-18 ENCOUNTER — Other Ambulatory Visit (HOSPITAL_COMMUNITY): Payer: Self-pay

## 2025-01-18 ENCOUNTER — Other Ambulatory Visit: Payer: Self-pay

## 2025-01-21 ENCOUNTER — Other Ambulatory Visit (HOSPITAL_COMMUNITY): Payer: Self-pay

## 2025-01-21 ENCOUNTER — Encounter: Payer: Self-pay | Admitting: Pharmacist

## 2025-01-21 ENCOUNTER — Other Ambulatory Visit: Payer: Self-pay

## 2025-01-24 ENCOUNTER — Other Ambulatory Visit: Payer: Self-pay

## 2025-02-15 ENCOUNTER — Ambulatory Visit: Admitting: Internal Medicine
